# Patient Record
Sex: Female | Born: 1985
Health system: Southern US, Community
[De-identification: ages and names within clinical notes are randomized; demographics above are authoritative.]

## PROBLEM LIST (undated history)

## (undated) DIAGNOSIS — R519 Headache, unspecified: Secondary | ICD-10-CM

## (undated) DIAGNOSIS — F32A Depression, unspecified: Secondary | ICD-10-CM

## (undated) DIAGNOSIS — O1400 Mild to moderate pre-eclampsia, unspecified trimester: Secondary | ICD-10-CM

## (undated) DIAGNOSIS — F329 Major depressive disorder, single episode, unspecified: Secondary | ICD-10-CM

## (undated) DIAGNOSIS — R51 Headache: Secondary | ICD-10-CM

## (undated) HISTORY — DX: Mild to moderate pre-eclampsia, unspecified trimester: O14.00

## (undated) HISTORY — DX: Headache, unspecified: R51.9

## (undated) HISTORY — PX: ULNAR NERVE REPAIR: SHX2594

## (undated) HISTORY — DX: Depression, unspecified: F32.A

## (undated) HISTORY — DX: Headache: R51

## (undated) HISTORY — PX: WISDOM TOOTH EXTRACTION: SHX21

## (undated) HISTORY — DX: Major depressive disorder, single episode, unspecified: F32.9

## (undated) HISTORY — PX: OTHER SURGICAL HISTORY: SHX169

## (undated) HISTORY — PX: FOOT SURGERY: SHX648

---
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
## 2005-04-13 ENCOUNTER — Emergency Department (HOSPITAL_COMMUNITY): Admission: EM | Admit: 2005-04-13 | Discharge: 2005-04-13 | Payer: Self-pay | Admitting: Emergency Medicine

## 2012-10-07 ENCOUNTER — Ambulatory Visit (INDEPENDENT_AMBULATORY_CARE_PROVIDER_SITE_OTHER): Payer: BC Managed Care – PPO | Admitting: Nurse Practitioner

## 2012-10-07 ENCOUNTER — Encounter: Payer: Self-pay | Admitting: Nurse Practitioner

## 2012-10-07 VITALS — BP 124/83 | HR 92 | Temp 98.1°F | Ht 62.75 in | Wt 152.0 lb

## 2012-10-07 DIAGNOSIS — G4700 Insomnia, unspecified: Secondary | ICD-10-CM

## 2012-10-07 DIAGNOSIS — F411 Generalized anxiety disorder: Secondary | ICD-10-CM | POA: Insufficient documentation

## 2012-10-07 MED ORDER — ALPRAZOLAM 0.25 MG PO TABS: 0.2500 mg | ORAL_TABLET | Freq: Every evening | ORAL | Status: DC | PRN

## 2012-10-07 NOTE — Progress Notes (Signed)
  Subjective:    Patient ID: Erica Rodriguez, female    DOB: Aug 02, 1985, 27 y.o.   MRN: [id number]  HPIPatient has Dx of GAD. Has been on Lexapro for 2 months and dose was increased to 20mg  at last check. Family and co-workers say she is not acting right since increased dose. They say she isn't happy and bubbly anymore. This all started because she was stressing out about not sleeping. Takes ambien 10mg  but doesn't take every night cause if sh does then it doesn't work. Has tried melatonin OTC which doesn't help with sleep.    Review of Systems  Constitutional: Negative.   HENT: Negative.   Eyes: Negative.   Respiratory: Negative.   Cardiovascular: Negative.   Gastrointestinal: Negative.   Endocrine: Negative.   Genitourinary: Negative.   Musculoskeletal: Negative.   Psychiatric/Behavioral: Negative.        Objective:   Physical Exam  Constitutional: She is oriented to person, place, and time. She appears well-developed and well-nourished.  Cardiovascular: Normal rate, normal heart sounds and intact distal pulses.   No murmur heard. Pulmonary/Chest: Effort normal and breath sounds normal.  Neurological: She is alert and oriented to person, place, and time.  Skin: Skin is warm and dry.  Psychiatric: She has a normal mood and affect. Her behavior is normal. Judgment and thought content normal.   BP 124/83  Pulse 92  Temp(Src) 98.1 F (36.7 C) (Oral)  Ht 5' 2.75" (1.594 m)  Wt 152 lb (68.947 kg)  BMI 27.14 kg/m2        Assessment & Plan:  Insomnia  alternate Xanax and ambien prn sleep- DO NOT TAKE BOTH IN SAME NIGHT  Bedtime ritual  Avoid caffiene 2 hrs prior to bedtime  Wean off lexapro- pt already down to 10mg  qd- every other day for 1 week then stop.  [name], FNP

## 2012-10-07 NOTE — Patient Instructions (Signed)

## 2012-11-19 ENCOUNTER — Other Ambulatory Visit: Payer: Self-pay | Admitting: *Deleted

## 2012-11-19 DIAGNOSIS — G4700 Insomnia, unspecified: Secondary | ICD-10-CM

## 2012-11-19 MED ORDER — ALPRAZOLAM 0.25 MG PO TABS: 0.2500 mg | ORAL_TABLET | Freq: Every evening | ORAL | Status: DC | PRN

## 2012-11-19 MED ORDER — ZOLPIDEM TARTRATE 10 MG PO TABS: 10.0000 mg | ORAL_TABLET | Freq: Every evening | ORAL | Status: DC | PRN

## 2012-11-19 NOTE — Telephone Encounter (Signed)
Please phone ambien and xanax rx

## 2012-11-19 NOTE — Telephone Encounter (Signed)
Patient requesting refill on ambien and xanax. Wanted to know if you would fill without her being seen. Both  meds last filled on 4-3. Please advise. If approved please have nurse phone in to [facility] and notify pt. Thank you

## 2012-11-19 NOTE — Addendum Note (Signed)
Addended by: Bennie Pierini on: 11/19/2012 12:28 PM   Modules accepted: Orders

## 2012-11-19 NOTE — Telephone Encounter (Signed)
Pt aware, both rx's called to [facility] vm.

## 2012-12-29 ENCOUNTER — Telehealth: Payer: Self-pay | Admitting: *Deleted

## 2012-12-29 DIAGNOSIS — G4700 Insomnia, unspecified: Secondary | ICD-10-CM

## 2012-12-29 MED ORDER — ZOLPIDEM TARTRATE 10 MG PO TABS: 10.0000 mg | ORAL_TABLET | Freq: Every evening | ORAL | Status: DC | PRN

## 2012-12-29 MED ORDER — ALPRAZOLAM 0.25 MG PO TABS: 0.2500 mg | ORAL_TABLET | Freq: Every evening | ORAL | Status: DC | PRN

## 2012-12-29 NOTE — Telephone Encounter (Signed)
PT AWARE BOTH MEDS WERE REFILLED AT [facility]

## 2012-12-29 NOTE — Telephone Encounter (Signed)
Please call in rx for xanax and ambien with 1 refill each

## 2012-12-29 NOTE — Telephone Encounter (Signed)
Wants refill on Ambien and Xanax. Really helping her sleep

## 2013-02-14 ENCOUNTER — Telehealth: Payer: Self-pay | Admitting: *Deleted

## 2013-02-14 MED ORDER — AZITHROMYCIN 250 MG PO TABS: ORAL_TABLET | ORAL | Status: DC

## 2013-02-14 NOTE — Telephone Encounter (Signed)
Son seen last week for URI now she has all the same symptoms and is unable to come in today. Can we please call her in an antibiotic. Her son Gerilyn Pilgrim saw you last week.

## 2013-02-14 NOTE — Telephone Encounter (Signed)
Left details on pt cell vm. 

## 2013-02-14 NOTE — Telephone Encounter (Signed)
zpak rx sent to pharmacy 

## 2013-02-25 ENCOUNTER — Encounter: Payer: Self-pay | Admitting: Nurse Practitioner

## 2013-02-25 ENCOUNTER — Ambulatory Visit (INDEPENDENT_AMBULATORY_CARE_PROVIDER_SITE_OTHER): Payer: BC Managed Care – PPO | Admitting: Nurse Practitioner

## 2013-02-25 VITALS — BP 116/78 | HR 78 | Temp 98.7°F | Ht 62.75 in | Wt 157.0 lb

## 2013-02-25 DIAGNOSIS — R6884 Jaw pain: Secondary | ICD-10-CM

## 2013-02-25 MED ORDER — METHYLPREDNISOLONE ACETATE 80 MG/ML IJ SUSP
80.0000 mg | Freq: Once | INTRAMUSCULAR | Status: AC
  Administered 2013-02-25: 80 mg via INTRAMUSCULAR

## 2013-02-25 MED ORDER — PREDNISONE 20 MG PO TABS: ORAL_TABLET | ORAL | Status: DC

## 2013-02-25 NOTE — Progress Notes (Signed)
  Subjective:    Patient ID: Erica Rodriguez, female    DOB: 1985/08/20, 27 y.o.   MRN: [id number]  HPI Patient had a feeling put in her right lower jaw last week and now she can't open her Jaw because it hurts so bad- Has taken Ibuprofen which hasn't helped. Hurts worse after work where se has to talk all day long.    Review of Systems  All other systems reviewed and are negative.       Objective:   Physical Exam  Constitutional: She appears well-developed and well-nourished.  Cardiovascular: Normal rate and normal heart sounds.   Pulmonary/Chest: Effort normal and breath sounds normal.  Musculoskeletal:  Limited ROM of jaw due to pain with opening on left- Mild edema- no point tenderness.   BP 116/78  Pulse 78  Temp(Src) 98.7 F (37.1 C) (Oral)  Ht 5' 2.75" (1.594 m)  Wt 157 lb (71.215 kg)  BMI 28.03 kg/m2        Assessment & Plan:  1. Jaw pain Ice if helps No chewing gum - predniSONE (DELTASONE) 20 MG tablet; 2 tablets at the same time daily for 5 days- start saturday  Dispense: 10 tablet; Refill: 0 - methylPREDNISolone acetate (DEPO-MEDROL) injection 80 mg; Inject 1 mL (80 mg total) into the muscle once.  [name], FNP

## 2013-02-25 NOTE — Patient Instructions (Signed)
Preventive Dental Care Preventative dental care is an important part of overall health for children and adults. Regular care of the teeth and gums can prevent tooth decay and gum disease. Substances are created in your mouth every day that need to be removed, including:  Plaque. This is a sticky substance around the teeth and gums.  Tartar. This is a hardened form of plaque. You can remove plaque and tartar by brushing and flossing. Brushing and flossing will also help prevent cavities, gingivitis, and tooth loss. Your dentist can remove the plaque and tartar not removed with regular daily care. A dentist can also find other dental problems. Problems that are detected early can be treated conservatively and with less expense. BABIES, TODDLERS, AND PRESCHOOL AGED CHILDREN  Clean your baby's gums after feedings with a wet washcloth (water only).  Do not give your baby a bottle with milk, formula, or juice for sipping before they go to sleep.  Brush your baby's emerging teeth with a small toothbrush and water 2 times per day.  Begin flossing your baby's teeth when they are in contact with one another.  Many dental problems are preventable with early assessment. Take your child to a dentist when the first tooth erupts or by age 1. Discuss risks for tooth decay, your child's growth and development, fluoride needs, oral habits, proper diet, and oral hygiene.  Bring your child to the dentist every 6 months.  Even when your child is over age 2, brush your child's teeth for him or her. Use a small pea-sized amount of fluoride toothpaste. Make sure your child does not swallow the toothpaste.  Contact your dentist if your child has pain or other problems in the mouth. SCHOOL AGED CHILDREN AND ADOLESCENTS  Continue to brush your child's teeth 2 times per day with a child toothbrush and pea-sized amount of toothpaste until your child is 6 to 7 years old.  Help your child floss until he or she can do  it properly.  Bring your child to the dentist every 6 months for professional cleanings and exams.  If your child is involved in contact sports, make sure he or she wears a properly fitted mouth guard.  Ask your dentist about dental sealants. This is a coating painted onto the molars to prevent decay.   Encourage a healthy diet. Help your child limit sugary drinks and foods.  Make sure your child has an early orthodontic evaluation. Your child should have an evaluation by about age 7. Many orthodontic problems can be treated early, preventing the need for full fixed braces in the future.  Talk to your adolescent about the risks of oral piercings and smoking. Help your child avoid these risks.  Contact your dental caregiver if your child has pain or other problems in the mouth. ADULTS  Brush at least 2 times per day and after meals if possible. Brush for at least 2 minutes.  Use a fluoride toothpaste or drink fluoridated water.  Floss daily.  Keep retainers, dentures, or other mouth devices clean and sanitized. Soak or brush them as directed.  Eat a healthy diet. Limit sugary drinks and foods.  See your dentist every 6 months.  Follow up with your dentist as directed.  Always see your dentist at the first sign of tooth or gum pain.  Avoid smoking.  Limit alcohol. ELDERLY OR THOSE WITH A CHRONIC HEALTH CONDITION Follow the adult guidelines above, and in addition:  Manage chronic conditions, such as diabetes. Certain conditions   can increase your risk of gum disease.  If you experience dry mouth from medicines, ask your caregiver about treatment options. Try drinking more water and chewing sugarless gum. Avoid alcohol.  Visit your dentist prior to cancer treatment to take care of any problems. SEEK IMMEDIATE DENTAL CARE IF:  You develop pain, bleeding, or soreness in the gum, tooth, jaw, or mouth area.  A permanent tooth becomes loose or separated from the gum  socket.  You experience a blow or injury to the mouth or jaw area. Document Released: 10/18/2010 Document Revised: 09/15/2011 Document Reviewed: 10/18/2010 Exit[employer] Patient Information 2014 [employer].  

## 2013-03-01 ENCOUNTER — Telehealth: Payer: Self-pay | Admitting: *Deleted

## 2013-03-01 NOTE — Telephone Encounter (Signed)
Patient notified

## 2013-03-01 NOTE — Telephone Encounter (Signed)
Should have helped by now- contact dentist

## 2013-03-01 NOTE — Telephone Encounter (Signed)
Wants to know if prednisone should be working by now. Still having difficulty opening mouth all the way and difficulty eating. Please advise

## 2013-03-03 NOTE — Telephone Encounter (Signed)
She contacted her dentist yesterday and he said to give it more time.  He told her that the needle may have went in too far.   She wants to know if you will prescribe an antibiotic to see if that will help.

## 2013-03-03 NOTE — Telephone Encounter (Signed)
That won't make a difference

## 2013-03-04 NOTE — Telephone Encounter (Signed)
Do you think she needs to come back in and be seen?

## 2013-03-04 NOTE — Telephone Encounter (Signed)
Wait and see if improves over the weekend

## 2013-03-04 NOTE — Telephone Encounter (Signed)
Patient notified. Will call back Monday if no better

## 2013-03-11 ENCOUNTER — Telehealth: Payer: Self-pay | Admitting: *Deleted

## 2013-03-11 DIAGNOSIS — G4700 Insomnia, unspecified: Secondary | ICD-10-CM

## 2013-03-11 MED ORDER — ALPRAZOLAM 0.25 MG PO TABS: 0.2500 mg | ORAL_TABLET | Freq: Every evening | ORAL | Status: DC | PRN

## 2013-03-11 MED ORDER — ZOLPIDEM TARTRATE 10 MG PO TABS: 10.0000 mg | ORAL_TABLET | Freq: Every evening | ORAL | Status: DC | PRN

## 2013-03-11 NOTE — Telephone Encounter (Signed)
Please call in rx for ambien and xanax with 1 refill each

## 2013-03-11 NOTE — Telephone Encounter (Signed)
meds called into [facility] at 330 pm- 9/5-jhb

## 2013-03-11 NOTE — Telephone Encounter (Signed)
Wants refill on ambien and xanax. Please advise

## 2013-05-09 ENCOUNTER — Other Ambulatory Visit: Payer: Self-pay | Admitting: *Deleted

## 2013-05-09 DIAGNOSIS — G4700 Insomnia, unspecified: Secondary | ICD-10-CM

## 2013-05-09 MED ORDER — ALPRAZOLAM 0.25 MG PO TABS: 0.2500 mg | ORAL_TABLET | Freq: Every evening | ORAL | Status: DC | PRN

## 2013-05-09 MED ORDER — ZOLPIDEM TARTRATE 10 MG PO TABS: 10.0000 mg | ORAL_TABLET | Freq: Every evening | ORAL | Status: DC | PRN

## 2013-05-09 NOTE — Telephone Encounter (Signed)
Called to [facility] 

## 2013-05-09 NOTE — Telephone Encounter (Signed)
Last seen in office on 02-25-13 for acute visit. Both rxs last filled on 04-08-13. Please advise. If approved please route to Pool B so nurse can phone in to [facility]

## 2013-05-09 NOTE — Telephone Encounter (Signed)
Please phone in ambien and xanax 

## 2013-05-12 ENCOUNTER — Ambulatory Visit (INDEPENDENT_AMBULATORY_CARE_PROVIDER_SITE_OTHER): Payer: BC Managed Care – PPO | Admitting: Nurse Practitioner

## 2013-05-12 ENCOUNTER — Telehealth: Payer: Self-pay | Admitting: Family Medicine

## 2013-05-12 ENCOUNTER — Encounter: Payer: Self-pay | Admitting: Nurse Practitioner

## 2013-05-12 VITALS — BP 123/85 | HR 87 | Temp 97.6°F | Ht 62.75 in | Wt 159.0 lb

## 2013-05-12 DIAGNOSIS — J019 Acute sinusitis, unspecified: Secondary | ICD-10-CM

## 2013-05-12 DIAGNOSIS — B9689 Other specified bacterial agents as the cause of diseases classified elsewhere: Secondary | ICD-10-CM

## 2013-05-12 MED ORDER — AZITHROMYCIN 250 MG PO TABS: ORAL_TABLET | ORAL | Status: DC

## 2013-05-12 NOTE — Progress Notes (Signed)
  Subjective:    Patient ID: Erica Rodriguez, female    DOB: 1986/06/12, 27 y.o.   MRN: [id number]  HPI Patient in c/o nasal congestion and headache- started about 2 days ago- mucinex d na dOTC inus meds no better.    Review of Systems  Constitutional: Positive for chills and fatigue. Negative for fever.  HENT: Positive for congestion, postnasal drip, rhinorrhea and sinus pressure. Negative for ear pain, mouth sores, sore throat and trouble swallowing.   Respiratory: Positive for cough.   Cardiovascular: Negative.   Gastrointestinal: Negative.   Genitourinary: Negative.   Musculoskeletal: Negative.        Objective:   Physical Exam  Constitutional: She is oriented to person, place, and time. She appears well-developed and well-nourished.  HENT:  Right Ear: Hearing, tympanic membrane, external ear and ear canal normal.  Left Ear: Hearing, tympanic membrane, external ear and ear canal normal.  Nose: Mucosal edema and rhinorrhea present. Right sinus exhibits maxillary sinus tenderness. Right sinus exhibits no frontal sinus tenderness. Left sinus exhibits maxillary sinus tenderness. Left sinus exhibits no frontal sinus tenderness.  Mouth/Throat: Uvula is midline, oropharynx is clear and moist and mucous membranes are normal.  Eyes: EOM are normal. Pupils are equal, round, and reactive to light.  Neck: Normal range of motion. Neck supple.  Cardiovascular: Normal rate, regular rhythm and normal heart sounds.   Pulmonary/Chest: Effort normal and breath sounds normal.  Lymphadenopathy:    She has no cervical adenopathy.  Neurological: She is alert and oriented to person, place, and time.  Skin: Skin is warm.  Psychiatric: She has a normal mood and affect. Her behavior is normal. Judgment and thought content normal.    BP 123/85  Pulse 87  Temp(Src) 97.6 F (36.4 C) (Oral)  Ht 5' 2.75" (1.594 m)  Wt 159 lb (72.122 kg)  BMI 28.39 kg/m2       Assessment & Plan:   1. Acute bacterial  sinusitis    Meds ordered this encounter  Medications  . azithromycin (ZITHROMAX) 250 MG tablet    Sig: As directed    Dispense:  6 each    Refill:  0    Order Specific Question:  Supervising Provider    Answer:  Ernestina Penna [[id number]]   1. Take meds as prescribed 2. Use a cool mist humidifier especially during the winter months and when heat has  been humid. 3. Use saline nose sprays frequently 4. Saline irrigations of the nose can be very helpful if done frequently.  * 4X daily for 1 week*  * Use of a nettie pot can be helpful with this. Follow directions with this* 5. Drink plenty of fluids 6. Keep thermostat turn down low 7.For any cough or congestion  Use plain Mucinex- regular strength or max strength is fine   * Children- consult with Pharmacist for dosing 8. For fever or aces or pains- take tylenol or ibuprofen appropriate for age and weight.  * for fevers greater than 101 orally you may alternate ibuprofen and tylenol every  3 hours.   [name], FNP

## 2013-05-12 NOTE — Telephone Encounter (Signed)
NTBS.

## 2013-05-12 NOTE — Telephone Encounter (Signed)
appt given  

## 2013-05-12 NOTE — Patient Instructions (Signed)

## 2013-05-12 NOTE — Telephone Encounter (Signed)
Wants something called in for sinus infection

## 2013-05-18 ENCOUNTER — Ambulatory Visit: Payer: BC Managed Care – PPO | Admitting: Nurse Practitioner

## 2013-06-17 ENCOUNTER — Ambulatory Visit (INDEPENDENT_AMBULATORY_CARE_PROVIDER_SITE_OTHER): Payer: BC Managed Care – PPO | Admitting: Physician Assistant

## 2013-06-17 ENCOUNTER — Encounter: Payer: Self-pay | Admitting: Physician Assistant

## 2013-06-17 VITALS — BP 135/82 | HR 97 | Temp 97.7°F | Ht 61.5 in | Wt 157.6 lb

## 2013-06-17 DIAGNOSIS — B373 Candidiasis of vulva and vagina: Secondary | ICD-10-CM

## 2013-06-17 DIAGNOSIS — N898 Other specified noninflammatory disorders of vagina: Secondary | ICD-10-CM

## 2013-06-17 DIAGNOSIS — B9789 Other viral agents as the cause of diseases classified elsewhere: Secondary | ICD-10-CM

## 2013-06-17 DIAGNOSIS — J029 Acute pharyngitis, unspecified: Secondary | ICD-10-CM

## 2013-06-17 DIAGNOSIS — N899 Noninflammatory disorder of vagina, unspecified: Secondary | ICD-10-CM

## 2013-06-17 DIAGNOSIS — J020 Streptococcal pharyngitis: Secondary | ICD-10-CM

## 2013-06-17 LAB — POCT URINALYSIS DIPSTICK
Bilirubin, UA: NEGATIVE
Glucose, UA: NEGATIVE
Ketones, UA: NEGATIVE
Leukocytes, UA: NEGATIVE
Nitrite, UA: NEGATIVE
Spec Grav, UA: <=1.005
Urobilinogen, UA: NEGATIVE
pH, UA: 7.5

## 2013-06-17 LAB — POCT UA - MICROSCOPIC ONLY
Bacteria, U Microscopic: NEGATIVE
Casts, Ur, LPF, POC: NEGATIVE
Crystals, Ur, HPF, POC: NEGATIVE
Mucus, UA: NEGATIVE
WBC, Ur, HPF, POC: NEGATIVE

## 2013-06-17 LAB — POCT RAPID STREP A (OFFICE): Rapid Strep A Screen: NEGATIVE

## 2013-06-17 MED ORDER — FLUCONAZOLE 150 MG PO TABS: ORAL_TABLET | ORAL | Status: DC

## 2013-06-17 MED ORDER — FLUTICASONE PROPIONATE 50 MCG/ACT NA SUSP: 2.0000 | Freq: Every day | NASAL | Status: DC

## 2013-06-17 NOTE — Progress Notes (Signed)
   Subjective:    Patient ID: Erica Rodriguez, female    DOB: [birth year], 27 y.o.   MRN: [id number]  HPI 27 y/o female presents w/ c/o intermittantly productive cough x 5 days. Associated sore throat, PND. Denies fever but has had sweats, chills. Son tested  Positive for strep last week. Has tried OTC Musinex and Musinex D with no relief. She also had some non-associated mild discomfort with urination and vaginal tenderness.    Review of Systems  Constitutional: Positive for chills, diaphoresis and fatigue. Negative for fever, activity change, appetite change and unexpected weight change.  HENT: Positive for congestion, postnasal drip, sore throat and trouble swallowing (due to throat pain). Negative for drooling, ear discharge, ear pain, facial swelling, hearing loss, mouth sores, nosebleeds, rhinorrhea, sinus pressure, sneezing and voice change.   Eyes: Negative.   Gastrointestinal: Negative.   Genitourinary: Positive for dysuria (discomfort with urination. no burning). Negative for urgency, frequency, hematuria, flank pain, decreased urine volume, vaginal bleeding, vaginal discharge, enuresis, difficulty urinating, genital sores, vaginal pain, menstrual problem, pelvic pain and dyspareunia.  Musculoskeletal: Negative.   Neurological: Positive for light-headedness.       Objective:   Physical Exam  Constitutional: She is oriented to person, place, and time. She appears well-developed and well-nourished. No distress.  HENT:  Head: Normocephalic and atraumatic.  Right Ear: Tympanic membrane and external ear normal.  Left Ear: Tympanic membrane and external ear normal.  Mouth/Throat: Oropharyngeal exudate present.  Posterior pharynx erythematous w/ tonsillar hypertrophy bilaterally  TTP of submandibular nodes bilaterally   Strep negative    Neck: Normal range of motion. Neck supple. No thyromegaly present.  Cardiovascular: Normal rate, regular rhythm and normal heart sounds.  Exam  reveals no gallop and no friction rub.   No murmur heard. Pulmonary/Chest: Effort normal and breath sounds normal. No respiratory distress. She has no wheezes. She has no rales. She exhibits no tenderness.  Genitourinary:  U/A positive for moderate yeast  Neurological: She is alert and oriented to person, place, and time. She has normal reflexes.  Skin: Skin is warm. She is not diaphoretic.          Assessment & Plan:  1. Vaginal candidiasis: Rx for Diflucan 150mg  on day 1, repeat in 3 days.  2. Nasal congestion: PE is negative for bacterial cause. Strep negative. Advised patient to use OTC musinex ( plain) and sudafed if needed for head congestion. Prescribed Flonase to use q day.   RTC if s/s worsen or do not improve

## 2013-06-22 ENCOUNTER — Telehealth: Payer: Self-pay | Admitting: Physician Assistant

## 2013-06-27 MED ORDER — FLUCONAZOLE 150 MG PO TABS: ORAL_TABLET | ORAL | Status: DC

## 2013-06-27 NOTE — Telephone Encounter (Signed)
Diflucan sent to phamacy

## 2013-07-12 ENCOUNTER — Telehealth: Payer: Self-pay | Admitting: *Deleted

## 2013-07-12 DIAGNOSIS — G4700 Insomnia, unspecified: Secondary | ICD-10-CM

## 2013-07-12 MED ORDER — ZOLPIDEM TARTRATE 10 MG PO TABS: 10.0000 mg | ORAL_TABLET | Freq: Every evening | ORAL | Status: DC | PRN

## 2013-07-12 MED ORDER — ALPRAZOLAM 0.25 MG PO TABS: 0.2500 mg | ORAL_TABLET | Freq: Every evening | ORAL | Status: DC | PRN

## 2013-07-12 NOTE — Telephone Encounter (Signed)
[place]  1 po Qhs #30 1 refill And xanax 0.25 1 po qd prn #30 1 refill

## 2013-07-12 NOTE — Telephone Encounter (Signed)
Patient needs a refill of her xanax and ambien. Please advise

## 2013-07-13 NOTE — Telephone Encounter (Signed)
Called in.

## 2013-09-12 ENCOUNTER — Other Ambulatory Visit: Payer: Self-pay | Admitting: *Deleted

## 2013-09-12 DIAGNOSIS — G4700 Insomnia, unspecified: Secondary | ICD-10-CM

## 2013-09-12 MED ORDER — ZOLPIDEM TARTRATE 10 MG PO TABS: 10.0000 mg | ORAL_TABLET | Freq: Every evening | ORAL | Status: DC | PRN

## 2013-09-12 MED ORDER — ALPRAZOLAM 0.25 MG PO TABS: 0.2500 mg | ORAL_TABLET | Freq: Every evening | ORAL | Status: DC | PRN

## 2013-09-12 NOTE — Telephone Encounter (Signed)
Patient last filled both on 08-12-13. Last office visit 06-17-13. Please advise. If approved please route to Pool B so nurse can phone in to pharmacy

## 2013-09-12 NOTE — Telephone Encounter (Signed)
Called to [facility]. Patient notified 

## 2013-09-12 NOTE — Telephone Encounter (Addendum)
Please call in xanax and ambien with 1 refills 

## 2013-11-15 ENCOUNTER — Telehealth: Payer: Self-pay | Admitting: *Deleted

## 2013-11-15 DIAGNOSIS — G4700 Insomnia, unspecified: Secondary | ICD-10-CM

## 2013-11-15 MED ORDER — ALPRAZOLAM 0.25 MG PO TABS: 0.2500 mg | ORAL_TABLET | Freq: Every evening | ORAL | Status: DC | PRN

## 2013-11-15 MED ORDER — ZOLPIDEM TARTRATE 10 MG PO TABS: 10.0000 mg | ORAL_TABLET | Freq: Every evening | ORAL | Status: DC | PRN

## 2013-11-15 NOTE — Telephone Encounter (Signed)
Please call in ambien 10 mg 1 po Qhs #30 and xanax 0.25 1 po QD prn with 1 refills each

## 2013-11-15 NOTE — Telephone Encounter (Signed)
Requesting a refill on xanax and ambien. Please advise

## 2013-11-15 NOTE — Telephone Encounter (Signed)
Called in.

## 2014-01-13 ENCOUNTER — Telehealth: Payer: Self-pay | Admitting: *Deleted

## 2014-01-13 DIAGNOSIS — G4700 Insomnia, unspecified: Secondary | ICD-10-CM

## 2014-01-13 MED ORDER — ALPRAZOLAM 0.25 MG PO TABS: 0.2500 mg | ORAL_TABLET | Freq: Every evening | ORAL | Status: DC | PRN

## 2014-01-13 MED ORDER — ZOLPIDEM TARTRATE 10 MG PO TABS: 10.0000 mg | ORAL_TABLET | Freq: Every evening | ORAL | Status: DC | PRN

## 2014-01-13 NOTE — Telephone Encounter (Signed)
Called in.

## 2014-01-13 NOTE — Telephone Encounter (Signed)
Please call in xanax 0.25 1 po BID #60 with 1 refills Please call in ambien 10mg  1 po qhs #30 with 1 refills

## 2014-01-13 NOTE — Telephone Encounter (Signed)
Wants ambien and xanax refilled

## 2014-02-10 ENCOUNTER — Ambulatory Visit (INDEPENDENT_AMBULATORY_CARE_PROVIDER_SITE_OTHER): Payer: BC Managed Care – PPO | Admitting: Physician Assistant

## 2014-02-10 ENCOUNTER — Encounter: Payer: Self-pay | Admitting: Physician Assistant

## 2014-02-10 VITALS — BP 124/80 | HR 90 | Temp 98.7°F | Ht 61.5 in | Wt 157.8 lb

## 2014-02-10 DIAGNOSIS — L708 Other acne: Secondary | ICD-10-CM

## 2014-02-10 DIAGNOSIS — L700 Acne vulgaris: Secondary | ICD-10-CM

## 2014-02-10 MED ORDER — MINOCYCLINE HCL 100 MG PO CAPS: 100.0000 mg | ORAL_CAPSULE | Freq: Every day | ORAL | Status: DC

## 2014-02-10 MED ORDER — TRETINOIN 0.025 % EX CREA: TOPICAL_CREAM | CUTANEOUS | Status: DC

## 2014-02-10 NOTE — Patient Instructions (Signed)
Download the APP Myfitnesspal  Acne Acne is a skin problem that causes small, red bumps (pimples). Acne happens when the tiny holes in your skin (pores) get blocked. Acne is most common on the face, neck, chest, and upper back. Your doctor can help you choose a treatment plan. It may take 2 months of treatment before your skin gets better. HOME CARE Good skin care is the most important part of treatment.  Wash your skin gently at least twice a day. Wash your skin after exercise. Always wash your skin before bed.  Use mild soap.  After you wash your face, put on a water-based face lotion.  Keep your hair off of your face. Wash your hair every day.  Only take medicines as told by your doctor.  Use a sunscreen or sunblock with SPF 30 or higher.  Choose makeup that does not block the holes in your skin (noncomedogenic).  Avoid leaning your chin or forehead on your hands.  Avoid wearing tight headbands or hats.  Avoid picking or squeezing your red bumps. This can make the problem worse and can leave scars. GET HELP RIGHT AWAY IF:   Your red bumps are not better after 8 weeks.  Your red bumps gets worse.  You have a large area of skin that is red or tender. MAKE SURE YOU:   Understand these instructions.  Will watch your condition.  Will get help right away if you are not doing well or get worse. Document Released: 06/12/2011 Document Revised: 09/15/2011 Document Reviewed: 06/12/2011 [facility] Patient Information 2015 [place], [place]. This information is not intended to replace advice given to you by your health care provider. Make sure you discuss any questions you have with your health care provider.   Exercise to Lose Weight Exercise and a healthy diet may help you lose weight. Your doctor may suggest specific exercises. EXERCISE IDEAS AND TIPS  Choose low-cost things you enjoy doing, such as walking, bicycling, or exercising to workout videos.  Take stairs instead of  the elevator.  Walk during your lunch break.  Park your car further away from work or school.  Go to a gym or an exercise class.  Start with 5 to 10 minutes of exercise each day. Build up to 30 minutes of exercise 4 to 6 days a week.  Wear shoes with good support and comfortable clothes.  Stretch before and after working out.  Work out until you breathe harder and your heart beats faster.  Drink extra water when you exercise.  Do not do so much that you hurt yourself, feel dizzy, or get very short of breath. Exercises that burn about 150 calories:  Running 1  miles in 15 minutes.  Playing volleyball for 45 to 60 minutes.  Washing and waxing a car for 45 to 60 minutes.  Playing touch football for 45 minutes.  Walking 1  miles in 35 minutes.  Pushing a stroller 1  miles in 30 minutes.  Playing basketball for 30 minutes.  Raking leaves for 30 minutes.  Bicycling 5 miles in 30 minutes.  Walking 2 miles in 30 minutes.  Dancing for 30 minutes.  Shoveling snow for 15 minutes.  Swimming laps for 20 minutes.  Walking up stairs for 15 minutes.  Bicycling 4 miles in 15 minutes.  Gardening for 30 to 45 minutes.  Jumping rope for 15 minutes.  Washing windows or floors for 45 to 60 minutes. Document Released: 07/26/2010 Document Revised: 09/15/2011 Document Reviewed: 07/26/2010 [employer] Patient  Information 2015 [place], [place]. This information is not intended to replace advice given to you by your health care provider. Make sure you discuss any questions you have with your health care provider. Calorie Counting for Weight Loss Calories are energy you get from the things you eat and drink. Your body uses this energy to keep you going throughout the day. The number of calories you eat affects your weight. When you eat more calories than your body needs, your body stores the extra calories as fat. When you eat fewer calories than your body needs, your body burns fat to  get the energy it needs. Calorie counting means keeping track of how many calories you eat and drink each day. If you make sure to eat fewer calories than your body needs, you should lose weight. In order for calorie counting to work, you will need to eat the number of calories that are right for you in a day to lose a healthy amount of weight per week. A healthy amount of weight to lose per week is usually 1-2 lb (0.5-0.9 kg). A dietitian can determine how many calories you need in a day and give you suggestions on how to reach your calorie goal.  WHAT IS MY MY PLAN? My goal is to have __________ calories per day.  If I have this many calories per day, I should lose around __________ pounds per week. WHAT DO I NEED TO KNOW ABOUT CALORIE COUNTING? In order to meet your daily calorie goal, you will need to:  Find out how many calories are in each food you would like to eat. Try to do this before you eat.  Decide how much of the food you can eat.  Write down what you ate and how many calories it had. Doing this is called keeping a food log. WHERE DO I FIND CALORIE INFORMATION? The number of calories in a food can be found on a Nutrition Facts label. Note that all the information on a label is based on a specific serving of the food. If a food does not have a Nutrition Facts label, try to look up the calories online or ask your dietitian for help. HOW DO I DECIDE HOW MUCH TO EAT? To decide how much of the food you can eat, you will need to consider both the number of calories in one serving and the size of one serving. This information can be found on the Nutrition Facts label. If a food does not have a Nutrition Facts label, look up the information online or ask your dietitian for help. Remember that calories are listed per serving. If you choose to have more than one serving of a food, you will have to multiply the calories per serving by the amount of servings you plan to eat. For example, the label  on a package of bread might say that a serving size is 1 slice and that there are 90 calories in a serving. If you eat 1 slice, you will have eaten 90 calories. If you eat 2 slices, you will have eaten 180 calories. HOW DO I KEEP A FOOD LOG? After each meal, record the following information in your food log:  What you ate.  How much of it you ate.  How many calories it had.  Then, add up your calories. Keep your food log near you, such as in a small notebook in your pocket. Another option is to use a mobile app or website. Some programs will  calculate calories for you and show you how many calories you have left each time you add an item to the log. WHAT ARE SOME CALORIE COUNTING TIPS?  Use your calories on foods and drinks that will fill you up and not leave you hungry. Some examples of this include foods like nuts and nut butters, vegetables, lean proteins, and high-fiber foods (more than 5 g fiber per serving).  Eat nutritious foods and avoid empty calories. Empty calories are calories you get from foods or beverages that do not have many nutrients, such as candy and soda. It is better to have a nutritious high-calorie food (such as an avocado) than a food with few nutrients (such as a bag of chips).  Know how many calories are in the foods you eat most often. This way, you do not have to look up how many calories they have each time you eat them.  Look out for foods that may seem like low-calorie foods but are really high-calorie foods, such as baked goods, soda, and fat-free candy.  Pay attention to calories in drinks. Drinks such as sodas, specialty coffee drinks, alcohol, and juices have a lot of calories yet do not fill you up. Choose low-calorie drinks like water and diet drinks.  Focus your calorie counting efforts on higher calorie items. Logging the calories in a garden salad that contains only vegetables is less important than calculating the calories in a milk shake.  Find a  way of tracking calories that works for you. Get creative. Most people who are successful find ways to keep track of how much they eat in a day, even if they do not count every calorie. WHAT ARE SOME PORTION CONTROL TIPS?  Know how many calories are in a serving. This will help you know how many servings of a certain food you can have.  Use a measuring cup to measure serving sizes. This is helpful when you start out. With time, you will be able to estimate serving sizes for some foods.  Take some time to put servings of different foods on your favorite plates, bowls, and cups so you know what a serving looks like.  Try not to eat straight from a bag or box. Doing this can lead to overeating. Put the amount you would like to eat in a cup or on a plate to make sure you are eating the right portion.  Use smaller plates, glasses, and bowls to prevent overeating. This is a quick and easy way to practice portion control. If your plate is smaller, less food can fit on it.  Try not to multitask while eating, such as watching TV or using your computer. If it is time to eat, sit down at a table and enjoy your food. Doing this will help you to start recognizing when you are full. It will also make you more aware of what and how much you are eating. HOW CAN I CALORIE COUNT WHEN EATING OUT?  Ask for smaller portion sizes or child-sized portions.  Consider sharing an entree and sides instead of getting your own entree.  If you get your own entree, eat only half. Ask for a box at the beginning of your meal and put the rest of your entree in it so you are not tempted to eat it.  Look for the calories on the menu. If calories are listed, choose the lower calorie options.  Choose dishes that include vegetables, fruits, whole grains, low-fat dairy products, and lean  protein. Focusing on smart food choices from each of the 5 food groups can help you stay on track at restaurants.  Choose items that are boiled,  broiled, grilled, or steamed.  Choose water, milk, unsweetened iced tea, or other drinks without added sugars. If you want an alcoholic beverage, choose a lower calorie option. For example, a regular margarita can have up to 700 calories and a glass of wine has around 150.  Stay away from items that are buttered, battered, fried, or served with cream sauce. Items labeled "crispy" are usually fried, unless stated otherwise.  Ask for dressings, sauces, and syrups on the side. These are usually very high in calories, so do not eat much of them.  Watch out for salads. Many people think salads are a healthy option, but this is often not the case. Many salads come with bacon, fried chicken, lots of cheese, fried chips, and dressing. All of these items have a lot of calories. If you want a salad, choose a garden salad and ask for grilled meats or steak. Ask for the dressing on the side, or ask for olive oil and vinegar or lemon to use as dressing.  Estimate how many servings of a food you are given. For example, a serving of cooked rice is  cup or about the size of half a tennis ball or one cupcake wrapper. Knowing serving sizes will help you be aware of how much food you are eating at restaurants. The list below tells you how big or small some common portion sizes are based on everyday objects.  1 oz--4 stacked dice.  3 oz--1 deck of cards.  1 tsp--1 dice.  1 Tbsp-- a Ping-Pong ball.  2 Tbsp--1 Ping-Pong ball.   cup--1 tennis ball or 1 cupcake wrapper.  1 cup--1 baseball. Document Released: 06/23/2005 Document Revised: 11/07/2013 Document Reviewed: 04/28/2013 [facility] Patient Information 2015 [place], [place]. This information is not intended to replace advice given to you by your health care provider. Make sure you discuss any questions you have with your health care provider.

## 2014-02-10 NOTE — Progress Notes (Signed)
   Subjective:    Patient ID: Erica Rodriguez, female    DOB: 09-16-1985, 28 y.o.   MRN: [id number]  HPI 28 y/o female presents with c/o weight gain over the past few months. She states that she tries to eat healthy and participates in exercise occasionally. She also states that since placement of her IUD, she has had increased acne breakouts on her face.     Review of Systems  Constitutional: Negative.   HENT: Negative.   Eyes: Negative.   Respiratory: Negative.   Cardiovascular: Negative.   Gastrointestinal: Negative.   Endocrine: Negative.   Genitourinary: Negative.   Musculoskeletal: Negative.   Skin: Negative.        Breakouts on face  Allergic/Immunologic: Negative.   Neurological: Negative.   Hematological: Negative.   Psychiatric/Behavioral: Negative.        Objective:   Physical Exam  Vitals reviewed. Constitutional: She is oriented to person, place, and time. She appears well-developed and well-nourished. No distress.  Cardiovascular: Normal rate, regular rhythm and normal heart sounds.   Pulmonary/Chest: Effort normal and breath sounds normal.  Neurological: She is alert and oriented to person, place, and time.  Skin: She is not diaphoretic.  Open and closed comedomes on face. Primarily chin          Assessment & Plan:  1. Weight gain. Handouts given on weight loss and exercise. Advised patient to exercise for at least 30 min, 3-5 days a week. Monitor foods with calorie counting and "My Fitness Pal" app. Discussed ways to cut back on calories and fat. Encouraged aerobic exercises such as running, jogging or walking at a moderate pace. If not change or patient begins to have suggestive symptoms, I suggest thyroid check at next visit.   2. Acne: Continue to use OTC Panoxyl wash. Rx Minocycline 100mg  q day with food along with topical Tretinoin .025% at bedtime. Handout given on acne. F/U in 3 months for reassessment.

## 2014-03-16 ENCOUNTER — Other Ambulatory Visit: Payer: Self-pay | Admitting: *Deleted

## 2014-03-16 DIAGNOSIS — G4700 Insomnia, unspecified: Secondary | ICD-10-CM

## 2014-03-16 MED ORDER — ALPRAZOLAM 0.25 MG PO TABS: 0.2500 mg | ORAL_TABLET | Freq: Every evening | ORAL | Status: DC | PRN

## 2014-03-16 MED ORDER — ZOLPIDEM TARTRATE 10 MG PO TABS: 10.0000 mg | ORAL_TABLET | Freq: Every evening | ORAL | Status: DC | PRN

## 2014-03-16 NOTE — Telephone Encounter (Signed)
Patient last seen in office on 02-10-14. Please advise on refills. If approved please route to pool B so nurse can phone in to pharmacy

## 2014-03-16 NOTE — Telephone Encounter (Signed)
Please call in xanax and ambien with 1 refills each 

## 2014-03-16 NOTE — Telephone Encounter (Signed)
Scripts called in

## 2014-03-28 ENCOUNTER — Telehealth: Payer: Self-pay | Admitting: *Deleted

## 2014-03-28 NOTE — Telephone Encounter (Signed)
Could the minocycline and retin A that she was started on for acne cause her to itch all over. Her eyes have also been very dry and itchy. Please advise

## 2014-03-28 NOTE — Telephone Encounter (Signed)
The minocycline could cause itching but usually causes rash as well if it is allergic reaction- the problem with eyes could be just seasonal allergies

## 2014-03-29 NOTE — Telephone Encounter (Signed)
Patient has eczema. Her main areas of concern are her eyelids, the corners or her mouth, and the inside of her thighs.  She isn't certain if this current problem is eczema or a reaction to the medication. She is taking cool showers but still notices an increase in itching after the shower.  She is taking Benadryl and Claritin as well. Appt scheduled for tomorrow afternoon. Patient aware.

## 2014-03-30 ENCOUNTER — Encounter: Payer: Self-pay | Admitting: Family

## 2014-03-30 ENCOUNTER — Ambulatory Visit (INDEPENDENT_AMBULATORY_CARE_PROVIDER_SITE_OTHER): Payer: BC Managed Care – PPO | Admitting: Family

## 2014-03-30 VITALS — BP 125/79 | HR 90 | Temp 97.9°F | Ht 61.5 in | Wt 158.0 lb

## 2014-03-30 DIAGNOSIS — L309 Dermatitis, unspecified: Secondary | ICD-10-CM

## 2014-03-30 DIAGNOSIS — L259 Unspecified contact dermatitis, unspecified cause: Secondary | ICD-10-CM

## 2014-03-30 MED ORDER — TRIAMCINOLONE ACETONIDE 0.025 % EX OINT: 1.0000 "application " | TOPICAL_OINTMENT | Freq: Two times a day (BID) | CUTANEOUS | Status: DC

## 2014-03-30 MED ORDER — DESOXIMETASONE 0.25 % EX CREA: 1.0000 "application " | TOPICAL_CREAM | Freq: Two times a day (BID) | CUTANEOUS | Status: DC

## 2014-03-30 NOTE — Patient Instructions (Signed)

## 2014-03-30 NOTE — Progress Notes (Signed)
   Subjective:    Patient ID: Erica Rodriguez, female    DOB: Jul 01, 1986, 28 y.o.   MRN: [id number]  Rash This is a new problem. The current episode started in the past 7 days. The problem has been waxing and waning since onset. The affected locations include the right upper leg and left upper leg (Inner thighs). The rash is characterized by itchiness and dryness. She was exposed to nothing. Pertinent negatives include no congestion, cough, eye pain, fatigue, fever, joint pain, shortness of breath or sore throat. Past treatments include antihistamine. The treatment provided mild relief. There is no history of allergies or asthma.      Review of Systems  Constitutional: Negative.  Negative for fever and fatigue.  HENT: Negative.  Negative for congestion and sore throat.   Eyes: Negative.  Negative for pain.  Respiratory: Negative.  Negative for cough and shortness of breath.   Cardiovascular: Negative.  Negative for palpitations.  Gastrointestinal: Negative.   Endocrine: Negative.   Genitourinary: Negative.   Musculoskeletal: Negative.  Negative for joint pain.  Skin: Positive for rash.  Neurological: Negative.  Negative for headaches.  Hematological: Negative.   Psychiatric/Behavioral: Negative.   All other systems reviewed and are negative.      Objective:   Physical Exam  Vitals reviewed. Constitutional: She is oriented to person, place, and time. She appears well-developed and well-nourished. No distress.  Eyes: Pupils are equal, round, and reactive to light.  Neck: Normal range of motion. Neck supple. No thyromegaly present.  Cardiovascular: Normal rate, regular rhythm, normal heart sounds and intact distal pulses.   No murmur heard. Pulmonary/Chest: Effort normal and breath sounds normal. No respiratory distress. She has no wheezes.  Abdominal: Soft. Bowel sounds are normal. She exhibits no distension. There is no tenderness.  Musculoskeletal: Normal range of motion. She  exhibits no edema and no tenderness.  Neurological: She is alert and oriented to person, place, and time. She has normal reflexes. No cranial nerve deficit.  Skin: Skin is warm and dry. Rash noted. There is erythema.  Generalized patchy rash on inner thighs  Psychiatric: She has a normal mood and affect. Her behavior is normal. Judgment and thought content normal.    BP 125/79  Pulse 90  Temp(Src) 97.9 F (36.6 C) (Oral)  Ht 5' 1.5" (1.562 m)  Wt 158 lb (71.668 kg)  BMI 29.37 kg/m2       Assessment & Plan:  1. Contact dermatitis -Do not scratch or pick -Keep moisturized    - triamcinolone (KENALOG) 0.025 % ointment; Apply 1 application topically 2 (two) times daily.  Dispense: 30 g; Refill: 0   2. Eczema - desoximetasone (TOPICORT) 0.25 % cream; Apply 1 application topically 2 (two) times daily.  Dispense: 30 g; Refill: 0  Jannifer Rodney, FNP

## 2014-05-04 ENCOUNTER — Other Ambulatory Visit: Payer: Self-pay | Admitting: *Deleted

## 2014-05-04 DIAGNOSIS — L700 Acne vulgaris: Secondary | ICD-10-CM

## 2014-05-04 MED ORDER — MINOCYCLINE HCL 100 MG PO CAPS: 100.0000 mg | ORAL_CAPSULE | Freq: Every day | ORAL | Status: DC

## 2014-05-16 ENCOUNTER — Other Ambulatory Visit: Payer: Self-pay | Admitting: *Deleted

## 2014-05-16 MED ORDER — ZOLPIDEM TARTRATE 10 MG PO TABS: 10.0000 mg | ORAL_TABLET | Freq: Every evening | ORAL | Status: DC | PRN

## 2014-05-16 NOTE — Telephone Encounter (Signed)
Please call in ambien with 1 refills 

## 2014-05-16 NOTE — Telephone Encounter (Signed)
Called in.

## 2014-05-16 NOTE — Telephone Encounter (Signed)
Patient last seen in office on 03-30-14. Please advise

## 2014-05-31 ENCOUNTER — Other Ambulatory Visit: Payer: Self-pay | Admitting: *Deleted

## 2014-05-31 MED ORDER — TRETINOIN 0.025 % EX CREA: TOPICAL_CREAM | CUTANEOUS | Status: DC

## 2014-07-14 ENCOUNTER — Other Ambulatory Visit: Payer: Self-pay | Admitting: Nurse Practitioner

## 2014-07-14 ENCOUNTER — Other Ambulatory Visit: Payer: Self-pay | Admitting: *Deleted

## 2014-07-14 NOTE — Telephone Encounter (Signed)
Last filled on 12/10

## 2014-07-14 NOTE — Telephone Encounter (Signed)
Patient last seen 03-30-14. Rx last filled on 05-16-14 with 1 RF. Please advise.

## 2014-07-15 MED ORDER — ZOLPIDEM TARTRATE 10 MG PO TABS: 10.0000 mg | ORAL_TABLET | Freq: Every evening | ORAL | Status: DC | PRN

## 2014-07-15 NOTE — Telephone Encounter (Signed)
Phoned in.

## 2014-07-15 NOTE — Telephone Encounter (Signed)
Please call in ambien with 1 refills 

## 2014-07-27 LAB — HM DIABETES EYE EXAM

## 2014-07-31 ENCOUNTER — Other Ambulatory Visit: Payer: Self-pay

## 2014-07-31 DIAGNOSIS — L700 Acne vulgaris: Secondary | ICD-10-CM

## 2014-07-31 MED ORDER — MINOCYCLINE HCL 100 MG PO CAPS: 100.0000 mg | ORAL_CAPSULE | Freq: Every day | ORAL | Status: DC

## 2014-07-31 NOTE — Telephone Encounter (Signed)
Last seen 03/30/14  Christy 

## 2014-09-14 ENCOUNTER — Other Ambulatory Visit: Payer: Self-pay | Admitting: Nurse Practitioner

## 2014-09-14 NOTE — Telephone Encounter (Signed)
Last seen 03/30/14, last filled 08/15/14. Route to pool A, [facility]

## 2014-09-15 NOTE — Telephone Encounter (Signed)
Called to [facility]. Patient notified 

## 2014-10-12 ENCOUNTER — Telehealth: Payer: Self-pay | Admitting: *Deleted

## 2014-10-12 DIAGNOSIS — G4700 Insomnia, unspecified: Secondary | ICD-10-CM

## 2014-10-12 MED ORDER — ALPRAZOLAM 0.25 MG PO TABS: 0.2500 mg | ORAL_TABLET | Freq: Every evening | ORAL | Status: DC | PRN

## 2014-10-12 NOTE — Telephone Encounter (Signed)
Patient would like a refill on her xanax. She knows its not due till next week but wanted to call early so we could get started on it

## 2014-10-12 NOTE — Telephone Encounter (Signed)
Please call in xanax 0.25mg  nightly prn- #30  with 1 refills

## 2014-10-13 NOTE — Telephone Encounter (Signed)
rx called into pharmacy

## 2014-10-16 ENCOUNTER — Other Ambulatory Visit: Payer: Self-pay | Admitting: *Deleted

## 2014-10-16 NOTE — Telephone Encounter (Signed)
Needs a refill on her ambien. Last seen on 03-30-14. Rx last filled on 09-15-14. Please advise

## 2014-10-17 MED ORDER — ZOLPIDEM TARTRATE 10 MG PO TABS: 10.0000 mg | ORAL_TABLET | Freq: Every evening | ORAL | Status: DC | PRN

## 2014-10-17 NOTE — Telephone Encounter (Signed)
Please call in ambien with 1 refills 

## 2014-10-17 NOTE — Telephone Encounter (Signed)
rx called into pharmacy

## 2014-11-07 ENCOUNTER — Other Ambulatory Visit: Payer: Self-pay | Admitting: Family

## 2014-11-08 NOTE — Telephone Encounter (Signed)
Last seen 03/30/14  [facility]

## 2014-11-20 ENCOUNTER — Other Ambulatory Visit: Payer: Self-pay | Admitting: *Deleted

## 2014-11-20 MED ORDER — TRETINOIN 0.025 % EX CREA: TOPICAL_CREAM | CUTANEOUS | Status: DC

## 2014-12-13 ENCOUNTER — Ambulatory Visit (INDEPENDENT_AMBULATORY_CARE_PROVIDER_SITE_OTHER): Payer: BLUE CROSS/BLUE SHIELD | Admitting: Diagnostic Neuroimaging

## 2014-12-13 ENCOUNTER — Ambulatory Visit (INDEPENDENT_AMBULATORY_CARE_PROVIDER_SITE_OTHER): Payer: BLUE CROSS/BLUE SHIELD

## 2014-12-13 ENCOUNTER — Encounter: Payer: Self-pay | Admitting: Diagnostic Neuroimaging

## 2014-12-13 VITALS — BP 120/83 | HR 93 | Ht 61.5 in | Wt 157.8 lb

## 2014-12-13 DIAGNOSIS — G4700 Insomnia, unspecified: Secondary | ICD-10-CM

## 2014-12-13 DIAGNOSIS — G4452 New daily persistent headache (NDPH): Secondary | ICD-10-CM | POA: Diagnosis not present

## 2014-12-13 DIAGNOSIS — H539 Unspecified visual disturbance: Secondary | ICD-10-CM

## 2014-12-13 MED ORDER — GADOPENTETATE DIMEGLUMINE 469.01 MG/ML IV SOLN: 15.0000 mL | Freq: Once | INTRAVENOUS | Status: AC | PRN

## 2014-12-13 NOTE — Progress Notes (Signed)
[facility]  PATIENT: Erica Rodriguez DOB: 28-Sep-1985  REFERRING CLINICIAN: Despina AriasYen Le HISTORY FROM: patient  REASON FOR VISIT: new consult    HISTORICAL  CHIEF COMPLAINT:  Chief Complaint  Patient presents with  . New Evaluation    new headaches with tinnitus     HISTORY OF PRESENT ILLNESS:   29 year old right-handed female here for evaluation of headache, eye pain, swollen optic nerves, retro-orbital pain. One and a half months ago patient noticed right-sided headache, with pressure and pulsating pushing sensation behind her right eye. She had some sensitivity to light, nausea, without phonophobia or vomiting. Initially she thought she was pregnant and took a home pregnancy test which was negative.  Symptoms have been constant since that time. She has chronic insomnia and has been on Ambien for several years. She has been on minocycline and trentinon cream for past 6 months.  Patient went to optometrist for evaluation was found to have mild optic nerve head edema bilaterally.    REVIEW OF SYSTEMS: Full 14 system review of systems performed and notable only for fatigue blurred vision eye pain spinning sensation moles feeling hot headache insomnia.   ALLERGIES: No Known Allergies  HOME MEDICATIONS: Outpatient Prescriptions Prior to Visit  Medication Sig Dispense Refill  . minocycline (MINOCIN,DYNACIN) 100 MG capsule TAKE 1 CAPSULE (100 MG TOTAL) BY MOUTH DAILY. 90 capsule 2  . tretinoin (RETIN-A) 0.025 % cream Apply bb size amt at bedtime 45 g 0  . zolpidem (AMBIEN) 10 MG tablet Take 1 tablet (10 mg total) by mouth at bedtime as needed. for sleep 30 tablet 1  . ALPRAZolam (XANAX) 0.25 MG tablet Take 1 tablet (0.25 mg total) by mouth at bedtime as needed for sleep. (Patient not taking: Reported on 12/13/2014) 30 tablet 1  . desoximetasone (TOPICORT) 0.25 % cream Apply 1 application topically 2 (two) times daily. 30 g 0  . triamcinolone (KENALOG) 0.025 % ointment  Apply 1 application topically 2 (two) times daily. 30 g 0  . zolpidem (AMBIEN) 10 MG tablet TAKE ONE TABLET BY MOUTH AT BEDTIME AS NEEDED 30 tablet 0   No facility-administered medications prior to visit.    PAST MEDICAL HISTORY: Past Medical History  Diagnosis Date  . Depression   . Mild preeclampsia     PAST SURGICAL HISTORY: Past Surgical History  Procedure Laterality Date  . Cesarean section    . Wisdom tooth extraction    . Foot surgery Bilateral     FAMILY HISTORY: Family History  Problem Relation Age of Onset  . Thyroid disease Mother   . Hyperlipidemia Mother   . Hypertension Father   . Kidney disease Father   . Cirrhosis Father   . Diabetes Maternal Grandmother   . Stroke Maternal Grandmother   . Stroke Maternal Grandfather     SOCIAL HISTORY:  History   Social History  . Marital Status: Married    Spouse Name: Casimiro NeedleMichael  . Number of Children: 1  . Years of Education: 12   Occupational History  . Not on file.   Social History Main Topics  . Smoking status: Never Smoker   . Smokeless tobacco: Not on file  . Alcohol Use: No  . Drug Use: No  . Sexual Activity: Yes   Other Topics Concern  . Not on file   Social History Narrative   Lives at home with husband and son   Drinks 1 soda a day      PHYSICAL EXAM  Filed  Vitals:   12/13/14 0816  BP: 120/83  Pulse: 93  Height: 5' 1.5" (1.562 m)  Weight: 157 lb 12.8 oz (71.578 kg)    Body mass index is 29.34 kg/(m^2).   Visual Acuity Screening   Right eye Left eye Both eyes  Without correction:     With correction: 20/50 20/50     No flowsheet data found.  GENERAL EXAM: Patient is in no distress; well developed, nourished and groomed; neck is supple  CARDIOVASCULAR: Regular rate and rhythm, no murmurs, no carotid bruits  NEUROLOGIC: MENTAL STATUS: awake, alert, oriented to person, place and time, recent and remote memory intact, normal attention and concentration, language fluent,  comprehension intact, naming intact, fund of knowledge appropriate CRANIAL NERVE: no papilledema on fundoscopic exam, pupils equal and reactive to light, visual fields full to confrontation, extraocular muscles intact, no nystagmus, facial sensation and strength symmetric, hearing intact, palate elevates symmetrically, uvula midline, shoulder shrug symmetric, tongue midline. MOTOR: normal bulk and tone, full strength in the BUE, BLE SENSORY: normal and symmetric to light touch, pinprick, temperature, vibration  COORDINATION: finger-nose-finger, fine finger movements normal REFLEXES: deep tendon reflexes present and symmetric GAIT/STATION: narrow based gait; able to walk tandem; romberg is negative    DIAGNOSTIC DATA (LABS, IMAGING, TESTING) - I reviewed patient records, labs, notes, testing and imaging myself where available.  No results found for: WBC, HGB, HCT, MCV, PLT No results found for: NA, K, CL, CO2, GLUCOSE, BUN, CREATININE, CALCIUM, PROT, ALBUMIN, AST, ALT, ALKPHOS, BILITOT, GFRNONAA, GFRAA No results found for: CHOL, HDL, LDLCALC, LDLDIRECT, TRIG, CHOLHDL No results found for: WUJW1X No results found for: VITAMINB12 No results found for: TSH     ASSESSMENT AND PLAN  29 y.o. year old female here with new onset headache, blurred vision, optic nerve edema, since April 2016. Will check further workup. Then treatment based on results. May need LP for opening pressure measurement after MRI.    Ddx: pseudotumor cerebri, migraine variant, tension HA, metabolic, pregnancy, other secondary HA  PLAN:  Orders Placed This Encounter  Procedures  . MR Brain W Wo Contrast  . CBC with Differential/Platelet  . Comprehensive metabolic panel  . Vitamin B12  . TSH  . Hemoglobin A1c  . POCT urine pregnancy    Return in about 1 month (around 01/12/2015).    Suanne Marker, MD 12/13/2014, 9:09 AM Certified in Neurology, Neurophysiology and Neuroimaging  [facility] [address], [address] [place], [place] [address] [phone]

## 2014-12-14 ENCOUNTER — Telehealth: Payer: Self-pay | Admitting: Diagnostic Neuroimaging

## 2014-12-14 ENCOUNTER — Other Ambulatory Visit: Payer: Self-pay | Admitting: *Deleted

## 2014-12-14 LAB — CBC WITH DIFFERENTIAL/PLATELET
Basophils Absolute: 0 10*3/uL (ref 0.0–0.2)
Basos: 0 %
EOS (ABSOLUTE): 0.2 10*3/uL (ref 0.0–0.4)
EOS: 3 %
HEMOGLOBIN: 13.5 g/dL (ref 11.1–15.9)
Hematocrit: 41.7 % (ref 34.0–46.6)
IMMATURE GRANS (ABS): 0 10*3/uL (ref 0.0–0.1)
Immature Granulocytes: 0 %
LYMPHS ABS: 1.3 10*3/uL (ref 0.7–3.1)
LYMPHS: 24 %
MCH: 31.3 pg (ref 26.6–33.0)
MCHC: 32.4 g/dL (ref 31.5–35.7)
MCV: 97 fL (ref 79–97)
Monocytes Absolute: 0.4 10*3/uL (ref 0.1–0.9)
Monocytes: 7 %
NEUTROS PCT: 66 %
Neutrophils Absolute: 3.5 10*3/uL (ref 1.4–7.0)
PLATELETS: 361 10*3/uL (ref 150–379)
RBC: 4.31 x10E6/uL (ref 3.77–5.28)
RDW: 12.9 % (ref 12.3–15.4)
WBC: 5.3 10*3/uL (ref 3.4–10.8)

## 2014-12-14 LAB — COMPREHENSIVE METABOLIC PANEL
ALT: 12 IU/L (ref 0–32)
AST: 14 IU/L (ref 0–40)
Albumin/Globulin Ratio: 1.5 (ref 1.1–2.5)
Albumin: 4.4 g/dL (ref 3.5–5.5)
Alkaline Phosphatase: 102 IU/L (ref 39–117)
BILIRUBIN TOTAL: 0.4 mg/dL (ref 0.0–1.2)
BUN/Creatinine Ratio: 17 (ref 8–20)
BUN: 11 mg/dL (ref 6–20)
CO2: 25 mmol/L (ref 18–29)
Calcium: 9.7 mg/dL (ref 8.7–10.2)
Chloride: 101 mmol/L (ref 97–108)
Creatinine, Ser: 0.63 mg/dL (ref 0.57–1.00)
GFR calc Af Amer: 140 mL/min/{1.73_m2} (ref >59)
GFR calc non Af Amer: 122 mL/min/{1.73_m2} (ref >59)
GLUCOSE: 90 mg/dL (ref 65–99)
Globulin, Total: 2.9 g/dL (ref 1.5–4.5)
POTASSIUM: 4.4 mmol/L (ref 3.5–5.2)
Sodium: 140 mmol/L (ref 134–144)
Total Protein: 7.3 g/dL (ref 6.0–8.5)

## 2014-12-14 LAB — HEMOGLOBIN A1C
Est. average glucose Bld gHb Est-mCnc: 117 mg/dL
Hgb A1c MFr Bld: 5.7 % — ABNORMAL HIGH (ref 4.8–5.6)

## 2014-12-14 LAB — TSH: TSH: 2.97 u[IU]/mL (ref 0.450–4.500)

## 2014-12-14 LAB — VITAMIN B12: Vitamin B-12: 269 pg/mL (ref 211–946)

## 2014-12-14 MED ORDER — ZOLPIDEM TARTRATE 10 MG PO TABS: 10.0000 mg | ORAL_TABLET | Freq: Every evening | ORAL | Status: DC | PRN

## 2014-12-14 NOTE — Telephone Encounter (Signed)
Patient last seen in office on 03-30-14. Rx last filled on 4-12 for #30 with 1 RF. Please advise and route to Pool B

## 2014-12-14 NOTE — Telephone Encounter (Signed)
Ambien refills on vm.

## 2014-12-14 NOTE — Telephone Encounter (Signed)
Patient is calling to get lab and MRI results.

## 2014-12-15 ENCOUNTER — Encounter: Payer: Self-pay | Admitting: Family Medicine

## 2014-12-15 ENCOUNTER — Ambulatory Visit (INDEPENDENT_AMBULATORY_CARE_PROVIDER_SITE_OTHER): Payer: BLUE CROSS/BLUE SHIELD | Admitting: Family Medicine

## 2014-12-15 VITALS — BP 124/87 | HR 99 | Temp 99.2°F | Ht 61.5 in | Wt 155.0 lb

## 2014-12-15 DIAGNOSIS — R739 Hyperglycemia, unspecified: Secondary | ICD-10-CM | POA: Diagnosis not present

## 2014-12-15 NOTE — Telephone Encounter (Signed)
Spoke to the pt on the phone and informed her of her lab results. I told her her MRI was normal and her lab results were normal except for her HgbA1C. I explained that she was in the pre-diabetes category and she was questioning if this could be causing her headaches. I explained that if her BG was spiking high or low and her body was not used to it, it could cause headaches. She also asked about the next step. I reviewed Dr. Fonda Kinder last office note and explained to her that he was thinking the next step would be an LP. She told me that she would think about getting that over the weekend and call me back Monday if she decided to get that done. She thanked me for calling and asked that I release the results to my chart for her.

## 2014-12-15 NOTE — Progress Notes (Signed)
   Subjective:    Patient ID: Erica Rodriguez, female    DOB: 05-Mar-1986, 29 y.o.   MRN: [id number]  HPI Pt went to optometrist with decr vision and was told optic nerve swollen; saw neuro and had neg MRI and labs.  A1C was borderline but FBS was normal.  Still has pain in eye  Patient Active Problem List   Diagnosis Date Noted  . Insomnia 10/07/2012  . GAD (generalized anxiety disorder) 10/07/2012   Outpatient Encounter Prescriptions as of 12/15/2014  Medication Sig  . ALPRAZolam (XANAX) 0.25 MG tablet Take 1 tablet (0.25 mg total) by mouth at bedtime as needed for sleep.  . Ibuprofen 200 MG CAPS Take by mouth.  . minocycline (MINOCIN,DYNACIN) 100 MG capsule TAKE 1 CAPSULE (100 MG TOTAL) BY MOUTH DAILY.  Marland Kitchen tretinoin (RETIN-A) 0.025 % cream Apply bb size amt at bedtime  . zolpidem (AMBIEN) 10 MG tablet Take 1 tablet (10 mg total) by mouth at bedtime as needed. for sleep   No facility-administered encounter medications on file as of 12/15/2014.       Review of Systems  Constitutional: Negative.   Respiratory: Negative.   Endocrine: Negative.   Neurological: Positive for headaches.       Objective:   Physical Exam  Constitutional: She is oriented to person, place, and time. She appears well-developed and well-nourished.  Eyes: Conjunctivae and EOM are normal.  Neck: Normal range of motion. Neck supple.  Cardiovascular: Normal rate, regular rhythm and normal heart sounds.   Pulmonary/Chest: Effort normal and breath sounds normal.  Abdominal: Soft. Bowel sounds are normal.  Musculoskeletal: Normal range of motion.  Neurological: She is alert and oriented to person, place, and time. She has normal reflexes.  Skin: Skin is warm and dry.  Psychiatric: She has a normal mood and affect. Her behavior is normal. Thought content normal.     BP 124/87 mmHg  Pulse 99  Temp(Src) 99.2 F (37.3 C) (Oral)  Ht 5' 1.5" (1.562 m)  Wt 155 lb (70.308 kg)  BMI 28.82 kg/m2      Assessment &  Plan:  1. Hyperglycemia Does not meet criteria for DM; at worst may be pre-diabetes.  Discussed life style modification. May have opthalmologic migraine; check back with neurology.  Frederica Kuster MD

## 2015-01-09 ENCOUNTER — Ambulatory Visit: Payer: BLUE CROSS/BLUE SHIELD | Admitting: Diagnostic Neuroimaging

## 2015-02-16 ENCOUNTER — Encounter: Payer: Self-pay | Admitting: Medical

## 2015-02-16 ENCOUNTER — Ambulatory Visit (INDEPENDENT_AMBULATORY_CARE_PROVIDER_SITE_OTHER): Payer: BLUE CROSS/BLUE SHIELD | Admitting: Medical

## 2015-02-16 ENCOUNTER — Other Ambulatory Visit: Payer: Self-pay | Admitting: Medical

## 2015-02-16 ENCOUNTER — Telehealth: Payer: Self-pay | Admitting: Medical

## 2015-02-16 VITALS — BP 120/76 | HR 80 | Temp 99.6°F | Resp 16 | Ht 61.5 in | Wt 159.0 lb

## 2015-02-16 DIAGNOSIS — J029 Acute pharyngitis, unspecified: Secondary | ICD-10-CM | POA: Diagnosis not present

## 2015-02-16 DIAGNOSIS — J069 Acute upper respiratory infection, unspecified: Secondary | ICD-10-CM

## 2015-02-16 LAB — POCT RAPID STREP A (OFFICE): Rapid Strep A Screen: NEGATIVE

## 2015-02-16 MED ORDER — ONDANSETRON HCL 4 MG PO TABS: 4.0000 mg | ORAL_TABLET | Freq: Three times a day (TID) | ORAL | Status: DC | PRN

## 2015-02-16 NOTE — Telephone Encounter (Signed)
pls let her know that strep was negative.  Use salt water gargles, warm fluids, can use ibuprofen 3 tablets at a time TID for aches, sore throat, fever.    Rest, hydrate well, and if worse or not improving, call.  Does she want me to send zofran for nausea?  If so let me know, otherwise, can use Emetrol OTC for nausea too.

## 2015-02-16 NOTE — Progress Notes (Signed)
Subjective:  Erica Rodriguez is a 29 y.o. female who presents for illness.  New patient today but is a new employee here as well.   Symptoms include 5 days of malaise, headache, but started getting sore throat yesterday, some nausea, right ear discomfort.   Denies fever, cough, congestion, vomiting, diarrhea, chills, sweats, or aches.   Treatment to date: Advil.  Her 7yo son has had some URI symptoms, but is prone to strep.  No other aggravating or relieving factors.  No other c/o.  ROS as in subjective.   Objective: Filed Vitals:   02/16/15 1130  BP: 120/76  Pulse: 80  Temp: 99.6 F (37.6 C)  Resp: 16    General appearance: Alert, WD/WN, no distress, mildly ill appearing                             Skin: warm, no rash                           Head: no sinus tenderness                            Eyes: conjunctiva normal, corneas clear, PERRLA                            Ears: pearly TMs, external ear canals normal                          Nose: septum midline, turbinates swollen, with erythema and no discharge             Mouth/throat: MMM, tongue normal, mild pharyngeal erythema                           Neck: supple, no adenopathy, no thyromegaly, nontender                          Heart: RRR, normal S1, S2, no murmurs                         Lungs: CTA bilaterally, no wheezes, rales, or rhonchi    Strep negative    Assessment: Encounter Diagnoses  Name Primary?  . Sore throat Yes  . Acute upper respiratory infection      Plan: Discussed diagnosis and treatment of URI.  Suggested symptomatic OTC remedies.  Nasal saline spray for congestion.  Tylenol or Ibuprofen OTC for fever and malaise.  Call/return in 2-3 days if symptoms aren't resolving.

## 2015-02-16 NOTE — Telephone Encounter (Signed)
Went over your recommendations with patient. She would like zofran to be called to [facility] in [place] (in chart), thanks.

## 2015-02-20 ENCOUNTER — Other Ambulatory Visit: Payer: Self-pay | Admitting: Medical

## 2015-02-20 MED ORDER — AMOXICILLIN 875 MG PO TABS: 875.0000 mg | ORAL_TABLET | Freq: Two times a day (BID) | ORAL | Status: DC

## 2015-03-16 ENCOUNTER — Ambulatory Visit (INDEPENDENT_AMBULATORY_CARE_PROVIDER_SITE_OTHER): Payer: BLUE CROSS/BLUE SHIELD | Admitting: Family Medicine

## 2015-03-16 ENCOUNTER — Encounter: Payer: Self-pay | Admitting: Family Medicine

## 2015-03-16 VITALS — BP 122/72 | HR 88 | Temp 98.9°F

## 2015-03-16 DIAGNOSIS — G43019 Migraine without aura, intractable, without status migrainosus: Secondary | ICD-10-CM

## 2015-03-16 MED ORDER — SUMATRIPTAN SUCCINATE 100 MG PO TABS: ORAL_TABLET | ORAL | Status: DC

## 2015-03-16 NOTE — Progress Notes (Signed)
   Subjective:    Patient ID: Erica Rodriguez, female    DOB: 1986/04/24, 29 y.o.   MRN: [id number]  HPI She is here for a 5 day history of headache behind her right eye that feels like a sharp pain. She had associated nausea Tuesday morning. She reports that her vision in her right eye is blurry. Denies double vision or eye pain. The pain has been waxing and waning over the past 5 days and is currently an 8 out of 10 which it was also this severe at onset. She denies fever, chills, unexplained weight loss, dizziness. She has a history of similar headache in April or May that triggered her to see the eye doctor and neurologist. At that time the headache lasted for 2 months and then resolved. She saw her eye doctor who told her that she had fluid or swelling behind her eye. She then saw a neurologist who ordered an MRI and that was negative.  She has been taking 800 mg of ibuprofen every 4 hours and Excedrin Migraine one pill daily.    Review of Systems Pertinent positives and negatives in the history of present illness.    Objective:   Physical Exam  Constitutional: She is oriented to person, place, and time. She appears well-developed and well-nourished. No distress.  HENT:  Head: Normocephalic and atraumatic.  Eyes: Conjunctivae and EOM are normal. Pupils are equal, round, and reactive to light.  Neck: Normal range of motion and full passive range of motion without pain. Neck supple.  Neurological: She is alert and oriented to person, place, and time. She has normal strength. No cranial nerve deficit or sensory deficit. She displays a negative Romberg sign.  Psychiatric: She has a normal mood and affect. Her speech is normal and behavior is normal. Judgment and thought content normal. Cognition and memory are normal.          Assessment & Plan:  Intractable migraine without aura and without status migrainosus  Discussed that I reviewed her MRI results however I am unable to see the  neurologist note. Her MRI was negative. She has been taking NSAIDs more often than recommended. Discussed that she should stop taking all NSAIDs for now and Start taking Tylenol, 1000 mg twice daily. She will also try taking Imitrex and see if this helps. She will call and schedule an appointment with her neurologist to follow up on this.

## 2015-03-16 NOTE — Patient Instructions (Signed)
Stop taking NSAIDs, including aspirin, Motrin, ibuprofen, Aleve, Advil. Only take Tylenol 1000 mg twice a day and hydrate with water. We will prescribe Imitrex for your headache. You need to call your neurologist and get in to see them as soon as they can get you in.    Migraine Headache A migraine headache is an intense, throbbing pain on one or both sides of your head. A migraine can last for 30 minutes to several hours. CAUSES  The exact cause of a migraine headache is not always known. However, a migraine may be caused when nerves in the brain become irritated and release chemicals that cause inflammation. This causes pain. Certain things may also trigger migraines, such as:  Alcohol.  Smoking.  Stress.  Menstruation.  Aged cheeses.  Foods or drinks that contain nitrates, glutamate, aspartame, or tyramine.  Lack of sleep.  Chocolate.  Caffeine.  Hunger.  Physical exertion.  Fatigue.  Medicines used to treat chest pain (nitroglycerine), birth control pills, estrogen, and some blood pressure medicines. SIGNS AND SYMPTOMS  Pain on one or both sides of your head.  Pulsating or throbbing pain.  Severe pain that prevents daily activities.  Pain that is aggravated by any physical activity.  Nausea, vomiting, or both.  Dizziness.  Pain with exposure to bright lights, loud noises, or activity.  General sensitivity to bright lights, loud noises, or smells. Before you get a migraine, you may get warning signs that a migraine is coming (aura). An aura may include:  Seeing flashing lights.  Seeing bright spots, halos, or zigzag lines.  Having tunnel vision or blurred vision.  Having feelings of numbness or tingling.  Having trouble talking.  Having muscle weakness. DIAGNOSIS  A migraine headache is often diagnosed based on:  Symptoms.  Physical exam.  A CT scan or MRI of your head. These imaging tests cannot diagnose migraines, but they can help rule  out other causes of headaches. TREATMENT Medicines may be given for pain and nausea. Medicines can also be given to help prevent recurrent migraines.  HOME CARE INSTRUCTIONS  Only take over-the-counter or prescription medicines for pain or discomfort as directed by your health care provider. The use of long-term narcotics is not recommended.  Lie down in a dark, quiet room when you have a migraine.  Keep a journal to find out what may trigger your migraine headaches. For example, write down:  What you eat and drink.  How much sleep you get.  Any change to your diet or medicines.  Limit alcohol consumption.  Quit smoking if you smoke.  Get 7-9 hours of sleep, or as recommended by your health care provider.  Limit stress.  Keep lights dim if bright lights bother you and make your migraines worse. SEEK IMMEDIATE MEDICAL CARE IF:   Your migraine becomes severe.  You have a fever.  You have a stiff neck.  You have vision loss.  You have muscular weakness or loss of muscle control.  You start losing your balance or have trouble walking.  You feel faint or pass out.  You have severe symptoms that are different from your first symptoms. MAKE SURE YOU:   Understand these instructions.  Will watch your condition.  Will get help right away if you are not doing well or get worse. Document Released: 06/23/2005 Document Revised: 11/07/2013 Document Reviewed: 02/28/2013 [facility] Patient Information 2015 [place], [place]. This information is not intended to replace advice given to you by your health care provider. Make sure  you discuss any questions you have with your health care provider.

## 2015-04-04 ENCOUNTER — Ambulatory Visit (INDEPENDENT_AMBULATORY_CARE_PROVIDER_SITE_OTHER): Payer: BLUE CROSS/BLUE SHIELD | Admitting: Diagnostic Neuroimaging

## 2015-04-04 ENCOUNTER — Encounter: Payer: Self-pay | Admitting: Diagnostic Neuroimaging

## 2015-04-04 VITALS — BP 124/74 | HR 95 | Ht 61.5 in | Wt 159.0 lb

## 2015-04-04 DIAGNOSIS — G43109 Migraine with aura, not intractable, without status migrainosus: Secondary | ICD-10-CM

## 2015-04-04 MED ORDER — DICLOFENAC POTASSIUM(MIGRAINE) 50 MG PO PACK: 50.0000 mg | PACK | ORAL | Status: DC | PRN

## 2015-04-04 MED ORDER — TOPIRAMATE 50 MG PO TABS: 50.0000 mg | ORAL_TABLET | Freq: Two times a day (BID) | ORAL | Status: DC

## 2015-04-04 NOTE — Patient Instructions (Signed)
Thank you for coming to see [place] at [facility]. I hope we have been able to provide you high quality care today.  You may receive a patient satisfaction survey over the next few weeks. We would appreciate your feedback and comments so that we may continue to improve ourselves and the health of our patients.  - try topiramate 11m at bedtime x 2 weeks; then increase to twice a day - try [place] as needed for breakthrough headache   ~~~~~~~~~~~~~~~~~~~~~~~~~~~~~~~~~~~~~~~~~~~~~~~~~~~~~~~~~~~~~~~~~  DR. PENUMALLI'S GUIDE TO HAPPY AND HEALTHY LIVING These are some of my general health and wellness recommendations. Some of them may apply to you better than others. Please use common sense as you try these suggestions and feel free to ask me any questions.   ACTIVITY/FITNESS Mental, social, emotional and physical stimulation are very important for brain and body health. Try learning a new activity (arts, music, language, sports, games).  Keep moving your body to the best of your abilities. You can do this at home, inside or outside, the park, community center, gym or anywhere you like. Consider a physical therapist or personal trainer to get started. Consider the app Sworkit. Fitness trackers such as smart-watches, smart-phones or Fitbits can help as well.   NUTRITION Eat more plants: colorful vegetables, nuts, seeds and berries.  Eat less sugar, salt, preservatives and processed foods.  Avoid toxins such as cigarettes and alcohol.  Drink water when you are thirsty. Warm water with a slice of lemon is an excellent morning drink to start the day.  Consider these websites for more information The Nutrition Source (hhttps://www.henry-hernandez.biz/ [employer] Nutrition (wWindowBlog.ch   RELAXATION Consider practicing mindfulness meditation or other relaxation techniques such as deep breathing, prayer, yoga, tai chi, massage. See  website mindful.org or the apps Headspace or Calm to help get started.   SLEEP Try to get at least 7-8+ hours sleep per day. Regular exercise and reduced caffeine will help you sleep better. Practice good sleep hygeine techniques. See website sleep.org for more information.   PLANNING Prepare estate planning, living will, healthcare POA documents. Sometimes this is best planned with the help of an attorney. Theconversationproject.org and agingwithdignity.org are excellent resources.

## 2015-04-04 NOTE — Addendum Note (Signed)
Addended by: [place] Pink on: 04/04/2015 03:40 PM   Modules accepted: Medications

## 2015-04-04 NOTE — Progress Notes (Signed)
[facility]  PATIENT: Erica Rodriguez DOB: 1985-07-20  REFERRING CLINICIAN:  HISTORY FROM: patient  REASON FOR VISIT: follow up   HISTORICAL  CHIEF COMPLAINT:  Chief Complaint  Patient presents with  . Headache    rm 6  . Follow-up    3 month    HISTORY OF PRESENT ILLNESS:   UPDATE 04/04/15: Since last visit, has continued with 6-7 mild HA per month; and occ severe right retroorbital severe HA, with nausea, sens to light; some "messed up" vision and spots before some HA. Dr. Cathey Endow ophthalmology eval'd patient, and no sig papilledema noted. MRI brain and labs unremarkable. Tried imitrex per PCP and had hot feeling, anxiety and flushing, with minimal benefit. Still not sleeping well at night, in spite of nightly ambien.   PRIOR HPI (12/13/14): 29 year old right-handed female here for evaluation of headache, eye pain, swollen optic nerves, retro-orbital pain. One and a half months ago patient noticed right-sided headache, with pressure and pulsating pushing sensation behind her right eye. She had some sensitivity to light, nausea, without phonophobia or vomiting. Initially she thought she was pregnant and took a home pregnancy test which was negative. Symptoms have been constant since that time. She has chronic insomnia and has been on Ambien for several years. She has been on minocycline and trentinon cream for past 6 months. Patient went to optometrist for evaluation was found to have mild optic nerve head edema bilaterally.    REVIEW OF SYSTEMS: Full 14 system review of systems performed and notable only for wt gain excess sweating leg swelling.      ALLERGIES: No Known Allergies  HOME MEDICATIONS: Outpatient Prescriptions Prior to Visit  Medication Sig Dispense Refill  . tretinoin (RETIN-A) 0.025 % cream Apply bb size amt at bedtime 45 g 0  . zolpidem (AMBIEN) 10 MG tablet Take 1 tablet (10 mg total) by mouth at bedtime as needed. for sleep 30 tablet 3  .  SUMAtriptan (IMITREX) 100 MG tablet Take 100 mg by mouth for headache and you may repeat dose after 2 hours if needed. Do not take more than  q24h (Patient not taking: Reported on 04/04/2015) 10 tablet 0  . acetaminophen (TYLENOL) 325 MG suppository Place 975 mg rectally every 4 (four) hours as needed.    . ALPRAZolam (XANAX) 0.25 MG tablet Take 1 tablet (0.25 mg total) by mouth at bedtime as needed for sleep. 30 tablet 1  . Ibuprofen 200 MG CAPS Take by mouth.    . minocycline (MINOCIN,DYNACIN) 100 MG capsule TAKE 1 CAPSULE (100 MG TOTAL) BY MOUTH DAILY. 90 capsule 2  . ondansetron (ZOFRAN) 4 MG tablet Take 1 tablet (4 mg total) by mouth every 8 (eight) hours as needed for nausea or vomiting. (Patient not taking: Reported on 03/16/2015) 20 tablet 0   No facility-administered medications prior to visit.    PAST MEDICAL HISTORY: Past Medical History  Diagnosis Date  . Depression   . Mild preeclampsia     PAST SURGICAL HISTORY: Past Surgical History  Procedure Laterality Date  . Cesarean section    . Wisdom tooth extraction    . Foot surgery Bilateral     FAMILY HISTORY: Family History  Problem Relation Age of Onset  . Thyroid disease Mother   . Hyperlipidemia Mother   . Hypertension Father   . Kidney disease Father   . Cirrhosis Father   . Diabetes Maternal Grandmother   . Stroke Maternal Grandmother   . Stroke Maternal Grandfather  SOCIAL HISTORY:  Social History   Social History  . Marital Status: Married    Spouse Name: Casimiro Needle  . Number of Children: 1  . Years of Education: 12   Occupational History  .      clincal registration   Social History Main Topics  . Smoking status: Never Smoker   . Smokeless tobacco: Never Used  . Alcohol Use: No  . Drug Use: No  . Sexual Activity: Yes   Other Topics Concern  . Not on file   Social History Narrative   Lives at home with husband and son   Drinks 1 soda a day      PHYSICAL EXAM  Filed Vitals:    04/04/15 1303  BP: 124/74  Pulse: 95  Height: 5' 1.5" (1.562 m)  Weight: 159 lb (72.122 kg)    Body mass index is 29.56 kg/(m^2).  No exam data present  No flowsheet data found.  GENERAL EXAM: Patient is in no distress; well developed, nourished and groomed; neck is supple  CARDIOVASCULAR: Regular rate and rhythm, no murmurs, no carotid bruits  NEUROLOGIC: MENTAL STATUS: awake, alert, language fluent, comprehension intact, naming intact, fund of knowledge appropriate CRANIAL NERVE: no papilledema on fundoscopic exam, pupils equal and reactive to light, visual fields full to confrontation, extraocular muscles intact, no nystagmus, facial sensation and strength symmetric, hearing intact, palate elevates symmetrically, uvula midline, shoulder shrug symmetric, tongue midline. MOTOR: normal bulk and tone, full strength in the BUE, BLE SENSORY: normal and symmetric to light touch, temperature, vibration  COORDINATION: finger-nose-finger, fine finger movements normal REFLEXES: deep tendon reflexes present and symmetric GAIT/STATION: narrow based gait; able to walk tandem; romberg is negative    DIAGNOSTIC DATA (LABS, IMAGING, TESTING) - I reviewed patient records, labs, notes, testing and imaging myself where available.  Lab Results  Component Value Date   WBC 5.3 12/13/2014   HCT 41.7 12/13/2014      Component Value Date/Time   NA 140 12/13/2014 0939   K 4.4 12/13/2014 0939   CL 101 12/13/2014 0939   CO2 25 12/13/2014 0939   GLUCOSE 90 12/13/2014 0939   BUN 11 12/13/2014 0939   CREATININE 0.63 12/13/2014 0939   CALCIUM 9.7 12/13/2014 0939   PROT 7.3 12/13/2014 0939   AST 14 12/13/2014 0939   ALT 12 12/13/2014 0939   ALKPHOS 102 12/13/2014 0939   BILITOT 0.4 12/13/2014 0939   GFRNONAA 122 12/13/2014 0939   GFRAA 140 12/13/2014 0939   No results found for: CHOL, HDL, LDLCALC, LDLDIRECT, TRIG, CHOLHDL Lab Results  Component Value Date   HGBA1C 5.7* 12/13/2014   Lab  Results  Component Value Date   VITAMINB12 269 12/13/2014   Lab Results  Component Value Date   TSH 2.970 12/13/2014    12/13/14 MRI brain [I reviewed images myself and agree with interpretation. -VRP]  - normal    ASSESSMENT AND PLAN  29 y.o. year old female here with new onset headache, blurred vision, possible optic nerve edema, since April 2016. MRI, labs, and eye exam are unremarkable. Now likely represents migraine with aura.    Dx:   Migraine with aura and without status migrainosus, not intractable     PLAN: - TPX for migraine prevention - cambia prn HA - health, sleep hygiene and nutrition advice reviewed  Meds ordered this encounter  Medications  . topiramate (TOPAMAX) 50 MG tablet    Sig: Take 1 tablet (50 mg total) by mouth 2 (two) times daily.  Dispense:  60 tablet    Refill:  12  . Diclofenac Potassium (CAMBIA) 50 MG PACK    Sig: Take 50 mg by mouth as needed (for migraine; max 1 packet per day).    Dispense:  9 each    Refill:  3   Return in about 3 months (around 07/04/2015).    Suanne Marker, MD 04/04/2015, 2:09 PM Certified in Neurology, Neurophysiology and Neuroimaging  [facility] [address], [address] [place], [place] [address] [phone]

## 2015-04-10 ENCOUNTER — Encounter: Payer: Self-pay | Admitting: *Deleted

## 2015-04-11 ENCOUNTER — Other Ambulatory Visit: Payer: Self-pay | Admitting: *Deleted

## 2015-04-12 MED ORDER — TRETINOIN 0.025 % EX CREA: TOPICAL_CREAM | CUTANEOUS | Status: DC

## 2015-04-12 MED ORDER — ZOLPIDEM TARTRATE 10 MG PO TABS: 10.0000 mg | ORAL_TABLET | Freq: Every evening | ORAL | Status: DC | PRN

## 2015-04-12 NOTE — Telephone Encounter (Signed)
Please call in abien with 1 refills

## 2015-04-12 NOTE — Telephone Encounter (Signed)
Called to [facility]. Patient notified 

## 2015-04-16 ENCOUNTER — Telehealth: Payer: Self-pay | Admitting: Diagnostic Neuroimaging

## 2015-04-16 ENCOUNTER — Telehealth: Payer: Self-pay | Admitting: *Deleted

## 2015-04-16 ENCOUNTER — Encounter: Payer: Self-pay | Admitting: Diagnostic Neuroimaging

## 2015-04-16 NOTE — Telephone Encounter (Signed)
Erica Rodriguez is not covered under the patient's ins plan.  Ins has been contacted and provided with clinical info asking that they grant an exception.  Request is under review.  Ref # [id number]

## 2015-04-16 NOTE — Telephone Encounter (Signed)
Patient is calling and states that her pharmacy needs and authorization to fill Rx Diclofenac Potassium 50 mg.  Thanks!

## 2015-04-16 NOTE — Telephone Encounter (Signed)
Per Dr Marjory Lies, spoke with patient and informed her she may take Minocycline and Topiramate at same time. She verbalized understanding. She inquired about Cambia and her insurance issue. Informed her that Jamey Ripa has sent message to Dr Marjory Lies. This RN will FU with Dr Marjory Lies later today and let her know.

## 2015-04-17 NOTE — Telephone Encounter (Signed)
[employer] [place] has approved the request for coverage on Cambia effective until 04/15/2016 Ref # [id number] I called the patient to advise.  Got no answer.  Left message.  As well recommended she go to [email or web address] to obtain discount voucher if she is eligible.  Asked that she call back if she has any questions.

## 2015-04-17 NOTE — Telephone Encounter (Signed)
Left voice mail for patient informing her that message was sent to Sydnee Levans tech to try generic brand per Dr Marjory Lies. Also informed her that Dr Marjory Lies inquired if she had tried a drug discount card. Also informed her that Shanda Bumps is aware of Dr Richrd Humbles question about drug discount card. Informed her that she will recieive call back in regard to Cambria/Diclofenac. Left this caller's name, number.

## 2015-05-01 ENCOUNTER — Ambulatory Visit (INDEPENDENT_AMBULATORY_CARE_PROVIDER_SITE_OTHER): Payer: BLUE CROSS/BLUE SHIELD | Admitting: Medical

## 2015-05-01 ENCOUNTER — Encounter: Payer: Self-pay | Admitting: Medical

## 2015-05-01 VITALS — BP 110/70 | HR 99 | Temp 98.1°F | Wt 158.0 lb

## 2015-05-01 DIAGNOSIS — J988 Other specified respiratory disorders: Secondary | ICD-10-CM

## 2015-05-01 MED ORDER — AZITHROMYCIN 250 MG PO TABS: ORAL_TABLET | ORAL | Status: DC

## 2015-05-01 MED ORDER — BENZONATATE 200 MG PO CAPS: 200.0000 mg | ORAL_CAPSULE | Freq: Three times a day (TID) | ORAL | Status: DC | PRN

## 2015-05-01 NOTE — Progress Notes (Signed)
Subjective: Chief Complaint  Patient presents with  . cough    deep breaths hurt her back, started friday, was really bad saturday. drainage down the back of throat, said it is making her feel sick to her stomach   Friday 5 days ago felt a little SOB. Saturday had pain in upper back with deep breathing.  Has been fatigued, and has felt worse with cough, achy over the last few days.   Feels post nasal drainage.   Has had some nausea.  Cough is like a dry heave at times.   Feels chills.  Has headache, some sinus pressure.  Denies fever, no vomiting, no body aches,no ear pain.   Has + sick contacts at work here.   Using tylenol and Nyquil.  She is a nonsmoker, no hx/o asthma.   No other aggravating or relieving factors. No other complaint.  Past Medical History  Diagnosis Date  . Depression   . Mild preeclampsia    ROS as in subjective   Objective: BP 110/70 mmHg  Pulse 99  Temp(Src) 98.1 F (36.7 C) (Oral)  Wt 158 lb (71.668 kg)  SpO2 99%  Gen: wd, wn, nad General appearance: alert, no distress, WD/WN, mildly ill appearing HEENT: normocephalic, sclerae anicteric, conjunctiva pink and moist, TMs pearly, nares with turbinate edema, worse L>R, mucoid discharge and erythema, pharynx normal, tonsils unremarkable Oral cavity: MMM, no lesions Neck: supple, no lymphadenopathy, no thyromegaly, no masses Heart: RRR, normal S1, S2, no murmurs Lungs: questionable rhonchi and dullness in left upper field, otherwise no wheezes, rhonchi, or rales Ext: no edema, no calve tenderness   Assessment: Encounter Diagnosis  Name Primary?  Marland Kitchen. Respiratory tract infection Yes    Plan: Discussed symptoms, concerns, and offered chest xray given back pain but she declines.  Begin empiric therapy for possible early pneumonia, Zpak, Tessalon Perles prn for cough, rest, hydrate well, and if not much improved within 4-5 days, then call or return.

## 2015-06-08 ENCOUNTER — Other Ambulatory Visit: Payer: Self-pay | Admitting: Nurse Practitioner

## 2015-06-11 NOTE — Telephone Encounter (Signed)
Please let pt know--she will need to be seen in clinic to discuss insomnia before any more refills can be sent.

## 2015-06-11 NOTE — Telephone Encounter (Signed)
Patient of MMM. Last seen in office on 12-15-14. Rx last filled on 05-14-15. Patient will be out of meds today. Requested Friday. Please advise. If approved please route to Pool B so nurse can phone in to pharmacy

## 2015-06-11 NOTE — Telephone Encounter (Signed)
Refill called to [facility]. lmovm for pt, NTBS before next refill.

## 2015-06-22 ENCOUNTER — Encounter: Payer: Self-pay | Admitting: Medical

## 2015-06-22 ENCOUNTER — Ambulatory Visit (INDEPENDENT_AMBULATORY_CARE_PROVIDER_SITE_OTHER): Payer: BLUE CROSS/BLUE SHIELD | Admitting: Medical

## 2015-06-22 VITALS — BP 120/78 | HR 93 | Temp 98.2°F | Resp 16 | Wt 158.0 lb

## 2015-06-22 DIAGNOSIS — J029 Acute pharyngitis, unspecified: Secondary | ICD-10-CM

## 2015-06-22 DIAGNOSIS — J069 Acute upper respiratory infection, unspecified: Secondary | ICD-10-CM | POA: Diagnosis not present

## 2015-06-22 DIAGNOSIS — R509 Fever, unspecified: Secondary | ICD-10-CM

## 2015-06-22 NOTE — Progress Notes (Signed)
  Subjective: Chief Complaint  Patient presents with  . sore throat    cough, chills. said that she is feeling like she is "freezing to death" said that she feels like she is losing her voice.     Erica DivineShauna H Rodriguez is a 29 y.o. female who presents for illness.  Started abruptly yesterday with chills, sore throat, ongoing chills and sore throat today, had low grade fever yesterday and today.  Voice is hoarse.  Felt similarly around Thanksgiving.  Felt sleepy and tired all day yesterday.   Has some cough, maybe some congestion.   No production.   Has been around sick contacts working here.   Has some body aches.   No wheezing, no NVD.  Using some Nyquil last night, tylenol this morning.  No other aggravating or relieving factors. No other complaint.  Past Medical History  Diagnosis Date  . Depression   . Mild preeclampsia    ROS as in subjective   Objective: BP 120/78 mmHg  Pulse 93  Temp(Src) 98.2 F (36.8 C) (Oral)  Resp 16  Wt 158 lb (71.668 kg)  SpO2 98%  General appearance: Alert, WD/WN, no distress, mildly ill appearing                             Skin: warm, no rash                           Head: no sinus tenderness                            Eyes: conjunctiva normal, corneas clear, PERRLA                            Ears: pearly TMs, external ear canals normal                          Nose: septum midline, turbinates swollen, with erythema and no discharge             Mouth/throat: MMM, tongue normal, mild pharyngeal erythema                           Neck: supple, no adenopathy, no thyromegaly, non tender                         Lungs: CTA bilaterally, no wheezes, rales, or rhonchi     Assessment  Encounter Diagnoses  Name Primary?  . Sore throat Yes  . Fever and chills   . Acute upper respiratory infection      Plan: Strep and flu swabs negative.   Symptoms and exam suggests viral URI.  Discussed supportive care, OTC remedies, and symptoms that would prompt recheck.   Patient was advised to call or return if worse or not improving in the next few days.  Patient voiced understanding of diagnosis, recommendations, and treatment plan.

## 2015-06-25 ENCOUNTER — Other Ambulatory Visit: Payer: Self-pay | Admitting: Medical

## 2015-06-25 MED ORDER — AMOXICILLIN-POT CLAVULANATE 875-125 MG PO TABS: 1.0000 | ORAL_TABLET | Freq: Two times a day (BID) | ORAL | Status: DC

## 2015-07-06 ENCOUNTER — Ambulatory Visit (INDEPENDENT_AMBULATORY_CARE_PROVIDER_SITE_OTHER): Payer: BLUE CROSS/BLUE SHIELD | Admitting: Nurse Practitioner

## 2015-07-06 ENCOUNTER — Encounter: Payer: Self-pay | Admitting: Nurse Practitioner

## 2015-07-06 VITALS — BP 120/85 | HR 77 | Temp 97.6°F | Ht 61.5 in | Wt 157.6 lb

## 2015-07-06 DIAGNOSIS — G4700 Insomnia, unspecified: Secondary | ICD-10-CM | POA: Diagnosis not present

## 2015-07-06 MED ORDER — ZOLPIDEM TARTRATE 10 MG PO TABS: 10.0000 mg | ORAL_TABLET | Freq: Every evening | ORAL | Status: DC | PRN

## 2015-07-06 MED ORDER — SUVOREXANT 15 MG PO TABS: 1.0000 | ORAL_TABLET | Freq: Every evening | ORAL | Status: DC | PRN

## 2015-07-06 NOTE — Progress Notes (Signed)
   Subjective:    Patient ID: Philis NettleShauna H Naill, female    DOB: 1986-05-10, 29 y.o.   MRN: [id number]  HPI Patient here today for follow up of insomnia- she is on ambien 10mg  and takes nightly- cannot sleep without them. Says that sh eusually goes  To bed around 10pm and wakes up around 2-3 AM then wants to fall back to sleep around 530 AM. She has no other medical problems and takes no other meds. Has no complaints today.    Review of Systems  Constitutional: Negative.   HENT: Negative.   Respiratory: Negative.   Cardiovascular: Negative.   Genitourinary: Negative.   Neurological: Negative.   Psychiatric/Behavioral: Negative.   All other systems reviewed and are negative.      Objective:   Physical Exam  Constitutional: She is oriented to person, place, and time. She appears well-developed and well-nourished.  Cardiovascular: Normal rate, regular rhythm and normal heart sounds.   Pulmonary/Chest: Effort normal.  Neurological: She is alert and oriented to person, place, and time. She has normal reflexes.  Skin: Skin is warm.  Psychiatric: She has a normal mood and affect. Her behavior is normal. Judgment and thought content normal.   BP 120/85 mmHg  Pulse 77  Temp(Src) 97.6 F (36.4 C) (Oral)  Ht 5' 1.5" (1.562 m)  Wt 157 lb 9.6 oz (71.487 kg)  BMI 29.30 kg/m2       Assessment & Plan:  1. Insomnia Try the belsomra and see if works better then [employer]lett-[employer]ardambien- if does will write rx. - zolpidem (AMBIEN) 10 MG tablet; Take 1 tablet (10 mg total) by mouth at bedtime as needed. for sleep  Dispense: 30 tablet; Refill: 0 - Suvorexant (BELSOMRA) 15 MG TABS; Take 1 tablet by mouth at bedtime and may repeat dose one time if needed.  Dispense: 10 tablet; Refill: 0  [name] Daphine DeutscherMartin, FNP

## 2015-07-06 NOTE — Patient Instructions (Signed)
Insomnia Insomnia is a sleep disorder that makes it difficult to fall asleep or to stay asleep. Insomnia can cause tiredness (fatigue), low energy, difficulty concentrating, mood swings, and poor performance at work or school.  There are three different ways to classify insomnia:  Difficulty falling asleep.  Difficulty staying asleep.  Waking up too early in the morning. Any type of insomnia can be long-term (chronic) or short-term (acute). Both are common. Short-term insomnia usually lasts for three months or less. Chronic insomnia occurs at least three times a week for longer than three months. CAUSES  Insomnia may be caused by another condition, situation, or substance, such as:  Anxiety.  Certain medicines.  Gastroesophageal reflux disease (GERD) or other gastrointestinal conditions.  Asthma or other breathing conditions.  Restless legs syndrome, sleep apnea, or other sleep disorders.  Chronic pain.  Menopause. This may include hot flashes.  Stroke.  Abuse of alcohol, tobacco, or illegal drugs.  Depression.  Caffeine.   Neurological disorders, such as Alzheimer disease.  An overactive thyroid (hyperthyroidism). The cause of insomnia may not be known. RISK FACTORS Risk factors for insomnia include:  Gender. Women are more commonly affected than men.  Age. Insomnia is more common as you get older.  Stress. This may involve your professional or personal life.  Income. Insomnia is more common in people with lower income.  Lack of exercise.   Irregular work schedule or night shifts.  Traveling between different time zones. SIGNS AND SYMPTOMS If you have insomnia, trouble falling asleep or trouble staying asleep is the main symptom. This may lead to other symptoms, such as:  Feeling fatigued.  Feeling nervous about going to sleep.  Not feeling rested in the morning.  Having trouble concentrating.  Feeling irritable, anxious, or depressed. TREATMENT   Treatment for insomnia depends on the cause. If your insomnia is caused by an underlying condition, treatment will focus on addressing the condition. Treatment may also include:   Medicines to help you sleep.  Counseling or therapy.  Lifestyle adjustments. HOME CARE INSTRUCTIONS   Take medicines only as directed by your health care provider.  Keep regular sleeping and waking hours. Avoid naps.  Keep a sleep diary to help you and your health care provider figure out what could be causing your insomnia. Include:   When you sleep.  When you wake up during the night.  How well you sleep.   How rested you feel the next day.  Any side effects of medicines you are taking.  What you eat and drink.   Make your bedroom a comfortable place where it is easy to fall asleep:  Put up shades or special blackout curtains to block light from outside.  Use a white noise machine to block noise.  Keep the temperature cool.   Exercise regularly as directed by your health care provider. Avoid exercising right before bedtime.  Use relaxation techniques to manage stress. Ask your health care provider to suggest some techniques that may work well for you. These may include:  Breathing exercises.  Routines to release muscle tension.  Visualizing peaceful scenes.  Cut back on alcohol, caffeinated beverages, and cigarettes, especially close to bedtime. These can disrupt your sleep.  Do not overeat or eat spicy foods right before bedtime. This can lead to digestive discomfort that can make it hard for you to sleep.  Limit screen use before bedtime. This includes:  Watching TV.  Using your smartphone, tablet, and computer.  Stick to a routine. This   can help you fall asleep faster. Try to do a quiet activity, brush your teeth, and go to bed at the same time each night.  Get out of bed if you are still awake after 15 minutes of trying to sleep. Keep the lights down, but try reading or  doing a quiet activity. When you feel sleepy, go back to bed.  Make sure that you drive carefully. Avoid driving if you feel very sleepy.  Keep all follow-up appointments as directed by your health care provider. This is important. SEEK MEDICAL CARE IF:   You are tired throughout the day or have trouble in your daily routine due to sleepiness.  You continue to have sleep problems or your sleep problems get worse. SEEK IMMEDIATE MEDICAL CARE IF:   You have serious thoughts about hurting yourself or someone else.   This information is not intended to replace advice given to you by your health care provider. Make sure you discuss any questions you have with your health care provider.   Document Released: 06/20/2000 Document Revised: 03/14/2015 Document Reviewed: 03/24/2014 Elsevier Interactive Patient Education 2016 Elsevier Inc.  

## 2015-07-17 ENCOUNTER — Other Ambulatory Visit: Payer: Self-pay | Admitting: Nurse Practitioner

## 2015-07-17 ENCOUNTER — Telehealth: Payer: Self-pay

## 2015-07-17 DIAGNOSIS — G4700 Insomnia, unspecified: Secondary | ICD-10-CM

## 2015-07-17 MED ORDER — ZOLPIDEM TARTRATE 10 MG PO TABS: 10.0000 mg | ORAL_TABLET | Freq: Every evening | ORAL | Status: DC | PRN

## 2015-07-17 NOTE — Telephone Encounter (Signed)
Should just switch back to [place]- rx ready for pick up

## 2015-07-17 NOTE — Telephone Encounter (Signed)
Patient notified

## 2015-07-17 NOTE — Telephone Encounter (Signed)
Patient states that belsomra is not working at all. She tried for 4 days in a room with no sleep. Had leftover ambien and used those to help to get some sleep. Is this normal for belsomra or should she just switch back? Please advise

## 2015-07-30 ENCOUNTER — Encounter: Payer: Self-pay | Admitting: Pediatrics

## 2015-07-30 ENCOUNTER — Ambulatory Visit (INDEPENDENT_AMBULATORY_CARE_PROVIDER_SITE_OTHER): Payer: BLUE CROSS/BLUE SHIELD | Admitting: Pediatrics

## 2015-07-30 VITALS — BP 125/82 | HR 92 | Temp 97.5°F | Ht <= 58 in | Wt 153.5 lb

## 2015-07-30 DIAGNOSIS — J019 Acute sinusitis, unspecified: Secondary | ICD-10-CM | POA: Diagnosis not present

## 2015-07-30 DIAGNOSIS — J029 Acute pharyngitis, unspecified: Secondary | ICD-10-CM

## 2015-07-30 DIAGNOSIS — J069 Acute upper respiratory infection, unspecified: Secondary | ICD-10-CM | POA: Diagnosis not present

## 2015-07-30 LAB — POCT RAPID STREP A (OFFICE): RAPID STREP A SCREEN: NEGATIVE

## 2015-07-30 MED ORDER — AMOXICILLIN 500 MG PO CAPS: 500.0000 mg | ORAL_CAPSULE | Freq: Two times a day (BID) | ORAL | Status: DC

## 2015-07-30 NOTE — Progress Notes (Signed)
Subjective:    Patient ID: Erica Rodriguez, female    DOB: August 23, 1985, 30 y.o.   MRN: [id number]  CC: Sore Throat and Hot Flashes   HPI: Erica Rodriguez is a 30 y.o. 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 Not sure if she has had any fevers.   Depression screen PHQ 2/9 07/30/2015  Decreased Interest 0  Down, Depressed, Hopeless 0  PHQ - 2 Score 0     Relevant past medical, surgical, family and social history reviewed and updated as indicated. Interim medical history since our last visit reviewed. Allergies and medications reviewed and updated.    ROS: Per HPI unless specifically indicated above  History  Smoking status  . Never Smoker   Smokeless tobacco  . Never Used    Past Medical History Patient Active Problem List   Diagnosis Date Noted  . Insomnia 10/07/2012  . GAD (generalized anxiety disorder) 10/07/2012    Current Outpatient Prescriptions  Medication Sig Dispense Refill  . Diclofenac Potassium (CAMBIA) 50 MG PACK Take 50 mg by mouth as needed (for migraine; max 1 packet per day). 9 each 3  . PREVIDENT 5000 SENSITIVE 1.1-5 % PSTE See admin instructions. Reported on 06/22/2015  5  . topiramate (TOPAMAX) 50 MG tablet Take 1 tablet (50 mg total) by mouth 2 (two) times daily. 60 tablet 12  . tretinoin (RETIN-A) 0.025 % cream Apply bb size amt at bedtime 45 g 0  . zolpidem (AMBIEN) 10 MG tablet Take 1 tablet (10 mg total) by mouth at bedtime as needed. for sleep 30 tablet 2  . amoxicillin (AMOXIL) 500 MG capsule Take 1 capsule (500 mg total) by mouth 2 (two) times daily. 20 capsule 0   No current facility-administered medications for this visit.      Objective:    BP 125/82 mmHg  Pulse 92  Temp(Src) 97.5 F (36.4 C) (Oral)  Ht 3' (0.914 m)  Wt 153 lb 8 oz (69.627 kg)  BMI 83.35 kg/m2  Wt Readings from Last 3 Encounters:  07/30/15 153 lb 8 oz (69.627 kg)  07/06/15 157 lb 9.6 oz (71.487 kg)  06/22/15 158 lb (71.668 kg)    Gen: NAD, alert, cooperative with exam, NCAT, congestion EYES: EOMI, no scleral injection or icterus ENT:  TMs dull gray b/l, OP with erythema, small amount of exudate b/l tonsils LYMPH: no cervical LAD CV: NRRR, normal S1/S2, no murmur, distal pulses 2+ b/l Resp: CTABL, no wheezes, normal WOB Abd: +BS, soft, NTND. no guarding or organomegaly Ext: No edema, warm Neuro: Alert and oriented, strength equal b/l UE and LE, coordination grossly normal MSK: normal muscle bulk     Assessment & Plan:    Erica Rodriguez was seen today for sore throat, likely due to acute URI. Rapid strep negative. Will send for culture. Discussed symptomatic care, if congestion worsening in two days stat antibiotic.   Diagnoses and all orders for this visit:  Sore throat -     POCT rapid strep A -     Culture, Group A Strep  Acute sinusitis, recurrence not specified, unspecified location -     amoxicillin (AMOXIL) 500  MG capsule; Take 1 capsule (500 mg total) by mouth 2 (two) times daily.  Acute URI     Follow up plan: Return if symptoms worsen or fail to improve.  Rex Kras, MD [facility] 07/30/2015, 11:42 AM

## 2015-08-01 LAB — CULTURE, GROUP A STREP: Strep A Culture: NEGATIVE

## 2015-08-07 ENCOUNTER — Ambulatory Visit (INDEPENDENT_AMBULATORY_CARE_PROVIDER_SITE_OTHER): Payer: BLUE CROSS/BLUE SHIELD | Admitting: Diagnostic Neuroimaging

## 2015-08-07 ENCOUNTER — Encounter: Payer: Self-pay | Admitting: Diagnostic Neuroimaging

## 2015-08-07 VITALS — BP 126/82 | HR 88 | Ht 61.5 in | Wt 154.6 lb

## 2015-08-07 DIAGNOSIS — G43109 Migraine with aura, not intractable, without status migrainosus: Secondary | ICD-10-CM | POA: Diagnosis not present

## 2015-08-07 MED ORDER — DICLOFENAC POTASSIUM(MIGRAINE) 50 MG PO PACK: 50.0000 mg | PACK | ORAL | Status: DC | PRN

## 2015-08-07 MED ORDER — TOPIRAMATE 50 MG PO TABS: 50.0000 mg | ORAL_TABLET | Freq: Two times a day (BID) | ORAL | Status: DC

## 2015-08-07 NOTE — Progress Notes (Signed)
[facility]  PATIENT: Erica Rodriguez DOB: 1986-05-28  REFERRING CLINICIAN:  HISTORY FROM: patient  REASON FOR VISIT: follow up   HISTORICAL  CHIEF COMPLAINT:  Chief Complaint  Patient presents with  . Migraine    rm 7, "doing better, when I do have HA, Cambia takes HA away"  . Follow-up    3 month    HISTORY OF PRESENT ILLNESS:   UPDATE 08/07/15: Since last visit, HA are improved. Now having 3-4 HA per month. Cambia helping for rescue medication.   UPDATE 04/04/15: Since last visit, has continued with 6-7 mild HA per month; and occ severe right retroorbital severe HA, with nausea, sens to light; some "messed up" vision and spots before some HA. Dr. Cathey Endow ophthalmology eval'd patient, and no sig papilledema noted. MRI brain and labs unremarkable. Tried imitrex per PCP and had hot feeling, anxiety and flushing, with minimal benefit. Still not sleeping well at night, in spite of nightly ambien.   PRIOR HPI (12/13/14): 30 year old right-handed female here for evaluation of headache, eye pain, swollen optic nerves, retro-orbital pain. One and a half months ago patient noticed right-sided headache, with pressure and pulsating pushing sensation behind her right eye. She had some sensitivity to light, nausea, without phonophobia or vomiting. Initially she thought she was pregnant and took a home pregnancy test which was negative. Symptoms have been constant since that time. She has chronic insomnia and has been on Ambien for several years. She has been on minocycline and trentinon cream for past 6 months. Patient went to optometrist for evaluation was found to have mild optic nerve head edema bilaterally.   REVIEW OF SYSTEMS: Full 14 system review of systems performed and notable only for headache.     ALLERGIES: No Known Allergies  HOME MEDICATIONS: Outpatient Prescriptions Prior to Visit  Medication Sig Dispense Refill  . amoxicillin (AMOXIL) 500 MG capsule Take 1  capsule (500 mg total) by mouth 2 (two) times daily. 20 capsule 0  . Diclofenac Potassium (CAMBIA) 50 MG PACK Take 50 mg by mouth as needed (for migraine; max 1 packet per day). 9 each 3  . PREVIDENT 5000 SENSITIVE 1.1-5 % PSTE See admin instructions. Reported on 06/22/2015  5  . topiramate (TOPAMAX) 50 MG tablet Take 1 tablet (50 mg total) by mouth 2 (two) times daily. 60 tablet 12  . tretinoin (RETIN-A) 0.025 % cream Apply bb size amt at bedtime 45 g 0  . zolpidem (AMBIEN) 10 MG tablet Take 1 tablet (10 mg total) by mouth at bedtime as needed. for sleep 30 tablet 2   No facility-administered medications prior to visit.    PAST MEDICAL HISTORY: Past Medical History  Diagnosis Date  . Depression   . Mild preeclampsia     PAST SURGICAL HISTORY: Past Surgical History  Procedure Laterality Date  . Cesarean section    . Wisdom tooth extraction    . Foot surgery Bilateral     FAMILY HISTORY: Family History  Problem Relation Age of Onset  . Thyroid disease Mother   . Hyperlipidemia Mother   . Hypertension Father   . Kidney disease Father   . Cirrhosis Father   . Diabetes Maternal Grandmother   . Stroke Maternal Grandmother   . Stroke Maternal Grandfather     SOCIAL HISTORY:  Social History   Social History  . Marital Status: Married    Spouse Name: Casimiro Needle  . Number of Children: 1  . Years of Education: 81  Occupational History  .      clincal registration   Social History Main Topics  . Smoking status: Never Smoker   . Smokeless tobacco: Never Used  . Alcohol Use: No  . Drug Use: No  . Sexual Activity: Yes   Other Topics Concern  . Not on file   Social History Narrative   Lives at home with husband and son   Drinks 1 soda a day      PHYSICAL EXAM  Filed Vitals:   08/07/15 1253  BP: 126/82  Pulse: 88  Height: 5' 1.5" (1.562 m)  Weight: 154 lb 9.6 oz (70.126 kg)    Body mass index is 28.74 kg/(m^2).  No exam data present  No flowsheet data  found.  GENERAL EXAM: Patient is in no distress; well developed, nourished and groomed; neck is supple  CARDIOVASCULAR: Regular rate and rhythm, no murmurs, no carotid bruits  NEUROLOGIC: MENTAL STATUS: awake, alert, language fluent, comprehension intact, naming intact, fund of knowledge appropriate CRANIAL NERVE: no papilledema on fundoscopic exam, pupils equal and reactive to light, visual fields full to confrontation, extraocular muscles intact, no nystagmus, facial sensation and strength symmetric, hearing intact, palate elevates symmetrically, uvula midline, shoulder shrug symmetric, tongue midline. MOTOR: normal bulk and tone, full strength in the BUE, BLE SENSORY: normal and symmetric to light touch, temperature, vibration  COORDINATION: finger-nose-finger, fine finger movements normal REFLEXES: deep tendon reflexes present and symmetric GAIT/STATION: narrow based gait; able to walk tandem; romberg is negative    DIAGNOSTIC DATA (LABS, IMAGING, TESTING) - I reviewed patient records, labs, notes, testing and imaging myself where available.  Lab Results  Component Value Date   WBC 5.3 12/13/2014   HCT 41.7 12/13/2014   MCV 97 12/13/2014   PLT 361 12/13/2014      Component Value Date/Time   NA 140 12/13/2014 0939   K 4.4 12/13/2014 0939   CL 101 12/13/2014 0939   CO2 25 12/13/2014 0939   GLUCOSE 90 12/13/2014 0939   BUN 11 12/13/2014 0939   CREATININE 0.63 12/13/2014 0939   CALCIUM 9.7 12/13/2014 0939   PROT 7.3 12/13/2014 0939   ALBUMIN 4.4 12/13/2014 0939   AST 14 12/13/2014 0939   ALT 12 12/13/2014 0939   ALKPHOS 102 12/13/2014 0939   BILITOT 0.4 12/13/2014 0939   GFRNONAA 122 12/13/2014 0939   GFRAA 140 12/13/2014 0939   No results found for: CHOL, HDL, LDLCALC, LDLDIRECT, TRIG, CHOLHDL Lab Results  Component Value Date   HGBA1C 5.7* 12/13/2014   Lab Results  Component Value Date   VITAMINB12 269 12/13/2014   Lab Results  Component Value Date   TSH  2.970 12/13/2014    12/13/14 MRI brain [I reviewed images myself and agree with interpretation. -VRP]  - normal    ASSESSMENT AND PLAN  30 y.o. year old female here with new onset headache, blurred vision, possible optic nerve edema, since April 2016. MRI, labs, and eye exam are unremarkable. Likely represents migraine with aura.    Dx:   Migraine with aura and without status migrainosus, not intractable     PLAN: - continue topiramate  BID for migraine prevention; may try increasing up to 50 / 100  - continue cambia prn HA  Meds ordered this encounter  Medications  . topiramate (TOPAMAX) 50 MG tablet    Sig: Take 1 tablet (50 mg total) by mouth 2 (two) times daily.    Dispense:  180 tablet    Refill:  4  . Diclofenac Potassium (CAMBIA) 50 MG PACK    Sig: Take 50 mg by mouth as needed (for migraine; max 1 packet per day).    Dispense:  9 each    Refill:  12   Return in about 6 months (around 02/04/2016).    Suanne Marker, MD 08/07/2015, 12:58 PM Certified in Neurology, Neurophysiology and Neuroimaging  [facility] [address], [address] [place], [place] [address] [phone]

## 2015-08-07 NOTE — Patient Instructions (Signed)
Thank you for coming to see [place] at [facility]. I hope we have been able to provide you high quality care today.  You may receive a patient satisfaction survey over the next few weeks. We would appreciate your feedback and comments so that we may continue to improve ourselves and the health of our patients.  - continue topiramate; may increase up to 170m twice a day - continue cambia as needed    ~~~~~~~~~~~~~~~~~~~~~~~~~~~~~~~~~~~~~~~~~~~~~~~~~~~~~~~~~~~~~~~~~  DR. PENUMALLI'S GUIDE TO HAPPY AND HEALTHY LIVING These are some of my general health and wellness recommendations. Some of them may apply to you better than others. Please use common sense as you try these suggestions and feel free to ask me any questions.   ACTIVITY/FITNESS Mental, social, emotional and physical stimulation are very important for brain and body health. Try learning a new activity (arts, music, language, sports, games).  Keep moving your body to the best of your abilities. You can do this at home, inside or outside, the park, community center, gym or anywhere you like. Consider a physical therapist or personal trainer to get started. Consider the app Sworkit. Fitness trackers such as smart-watches, smart-phones or Fitbits can help as well.   NUTRITION Eat more plants: colorful vegetables, nuts, seeds and berries.  Eat less sugar, salt, preservatives and processed foods.  Avoid toxins such as cigarettes and alcohol.  Drink water when you are thirsty. Warm water with a slice of lemon is an excellent morning drink to start the day.  Consider these websites for more information The Nutrition Source (hhttps://www.henry-hernandez.biz/ [employer] Nutrition (wWindowBlog.ch   RELAXATION Consider practicing mindfulness meditation or other relaxation techniques such as deep breathing, prayer, yoga, tai chi, massage. See website mindful.org or the apps  Headspace or Calm to help get started.   SLEEP Try to get at least 7-8+ hours sleep per day. Regular exercise and reduced caffeine will help you sleep better. Practice good sleep hygeine techniques. See website sleep.org for more information.   PLANNING Prepare estate planning, living will, healthcare POA documents. Sometimes this is best planned with the help of an attorney. Theconversationproject.org and agingwithdignity.org are excellent resources.

## 2015-09-10 ENCOUNTER — Encounter: Payer: Self-pay | Admitting: Diagnostic Neuroimaging

## 2015-09-24 ENCOUNTER — Ambulatory Visit (INDEPENDENT_AMBULATORY_CARE_PROVIDER_SITE_OTHER): Payer: BLUE CROSS/BLUE SHIELD | Admitting: Family Medicine

## 2015-09-24 ENCOUNTER — Encounter: Payer: Self-pay | Admitting: Family Medicine

## 2015-09-24 VITALS — BP 112/70 | HR 64 | Temp 97.8°F

## 2015-09-24 DIAGNOSIS — J069 Acute upper respiratory infection, unspecified: Secondary | ICD-10-CM | POA: Diagnosis not present

## 2015-09-24 LAB — POCT RAPID STREP A (OFFICE): RAPID STREP A SCREEN: NEGATIVE

## 2015-09-24 NOTE — Patient Instructions (Signed)
Let's treat your symptoms and see if you start to improve. Take the Norel-AD or a decongestant and see if your congestion improves. Use the saline nasal spray as needed.  Your strep test was negative.  If you get worse we will need to send in antibiotic prescription.

## 2015-09-24 NOTE — Progress Notes (Signed)
Subjective:  Erica Rodriguez is a 30 y.o. female who presents for possible sinus infection.  Symptoms include URI symptoms that started 3 days ago with nasal congestion, bilateral ear pain, left worse than right, ear pain worse with blowing her nose. Reports clear drainage with some blood.  Denies fever, chills, bodyaches, cough. Denies fever, chills, body aches, cough.   Past history is significant for no history of pneumonia or bronchitis. Patient is a non-smoker.  Using neti pot for symptoms.  positive sick contacts.  No other aggravating or relieving factors.  No other c/o.  ROS as in subjective   Objective: Filed Vitals:   09/24/15 1131  BP: 112/70  Pulse: 64  Temp: 97.8 F (36.6 C)    General appearance: Alert, WD/WN, no distress                             Skin: warm, no rash                           Head: + mild maxillary sinus tenderness,                            Eyes: conjunctiva normal, corneas clear, PERRLA                            Ears: pearly TMs, external ear canals normal                          Nose: septum midline, turbinates swollen, with erythema and clear discharge             Mouth/throat: MMM, tongue normal, moderate pharyngeal erythema                           Neck: supple, mild left anterior cervical adenopathy, no thyromegaly, nontender                          Heart: RRR, normal S1, S2, no murmurs                         Lungs: CTA bilaterally, no wheezes, rales, or rhonchi      Assessment and Plan: Acute URI - Plan: POCT rapid strep A  Rapid strep negative  Discussed that her symptoms are related to viral vs bacterial etiology at this point. Norel AD sample given and saline nasal spray.  Can use OTC Mucinex for congestion.  Tylenol or Ibuprofen OTC for fever and malaise.  Discussed symptomatic relief, nasal saline flush, and call or return if worse or not improving in 2-3 days.

## 2015-09-26 ENCOUNTER — Other Ambulatory Visit: Payer: Self-pay | Admitting: Family Medicine

## 2015-09-26 DIAGNOSIS — J019 Acute sinusitis, unspecified: Secondary | ICD-10-CM

## 2015-09-26 MED ORDER — AMOXICILLIN 875 MG PO TABS: 875.0000 mg | ORAL_TABLET | Freq: Two times a day (BID) | ORAL | Status: DC

## 2015-09-26 NOTE — Progress Notes (Signed)
Patient with persistent nasal congestion, now has purulent drainage and worsening left ear pain. Amoxil sent to pharmacy

## 2015-10-11 ENCOUNTER — Other Ambulatory Visit: Payer: Self-pay

## 2015-10-11 DIAGNOSIS — G4700 Insomnia, unspecified: Secondary | ICD-10-CM

## 2015-10-11 MED ORDER — ZOLPIDEM TARTRATE 10 MG PO TABS: 10.0000 mg | ORAL_TABLET | Freq: Every evening | ORAL | Status: DC | PRN

## 2015-10-11 MED ORDER — TRETINOIN 0.025 % EX CREA: TOPICAL_CREAM | CUTANEOUS | Status: DC

## 2015-10-11 NOTE — Telephone Encounter (Signed)
Patient last seen in office on 1-23. Please advise on refills

## 2015-10-11 NOTE — Telephone Encounter (Signed)
Please call in ambien with 1 refills 

## 2015-10-12 ENCOUNTER — Encounter: Payer: Self-pay | Admitting: Diagnostic Neuroimaging

## 2015-10-12 NOTE — Telephone Encounter (Signed)
I called patient. She has been having more HA, but had stomach bug last week and ate some cheese. No stress. Cannot tolerate TPX 50/100 due to acne reaction. Offered pt to come in on Monday for office visit or migraine infusion. She will rest over the weekend and send us message if she wants to come in. -VRP

## 2015-10-13 ENCOUNTER — Ambulatory Visit (INDEPENDENT_AMBULATORY_CARE_PROVIDER_SITE_OTHER): Payer: BLUE CROSS/BLUE SHIELD | Admitting: Family Medicine

## 2015-10-13 VITALS — BP 110/75 | HR 93 | Temp 97.4°F | Ht 61.5 in | Wt 149.2 lb

## 2015-10-13 DIAGNOSIS — G43819 Other migraine, intractable, without status migrainosus: Secondary | ICD-10-CM

## 2015-10-13 DIAGNOSIS — M26629 Arthralgia of temporomandibular joint, unspecified side: Secondary | ICD-10-CM

## 2015-10-13 NOTE — Patient Instructions (Signed)
Great to see you!  It looks Scientist, physiologicalliek yor ear infection has resolved  For your headache I have given you toradol (an NSAID like advil) and depo medrol (a steroid), The steroid will likely help your ear and jaw pain too.   Try nasocort or flonase 2 sprays each nostril for your sinuses

## 2015-10-13 NOTE — Progress Notes (Signed)
   HPI  Patient presents today here for ear pain.  Patient explains that over the last 2-3 weeks she's had ear pain. It started after she used a new nasal saline rinse bottle, she usually has the passive boring type, this type was a pressure squeezing type She had immediate ear pain after using the saline wash. It went away but then lingered for several days.  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 She just finished the amoxicillin and continues to have pain.  She also has pain with chewing on the left anterior ear area, the pain radiates down to her left neck.  She denies cough, shortness of breath, malaise, or other concerns of infection. She did have congestion prior to the amoxicillin and feels better.  She's also had a bitemporal headache consistent with her previous migraines that has been persistent and lingering for about 2 weeks. She called her neurologist who stated she needed to be seen today as they were closing by the time she called.  PMH: Smoking status noted ROS: Per HPI  Objective: BP 110/75 mmHg  Pulse 93  Temp(Src) 97.4 F (36.3 C) (Oral)  Ht 5' 1.5" (1.562 m)  Wt 149 lb 4 oz (67.699 kg)  BMI 27.75 kg/m2 Gen: NAD, alert, cooperative with exam HEENT: NCAT, bilateral TMs normal, pain with palpation of the left TMJ with opening CV: RRR, good S1/S2, no murmur Resp: CTABL, no wheezes, non-labored Ext: No edema, warm Neuro: Alert and oriented, No gross deficits  Assessment and plan:  # Migraine headache IM Toradol and Depo-Medrol given today Discussed other headache prophylaxis, she has medications to try at home which are usually effective. Pain consistent with previous migraines  # Left ear pain, TMJ pain Ear infection resolved after course of amoxicillin Pain with palpation of the left TMJ mouth opening. I believe the Toradol and Depo-Medrol will be very effective in  reducing inflammation at the area    Murtis SinkSam Colter Magowan, MD [facility] 10/13/2015, 9:11 AM

## 2015-10-16 MED ORDER — KETOROLAC TROMETHAMINE 60 MG/2ML IM SOLN: 60.0000 mg | Freq: Once | INTRAMUSCULAR | Status: DC

## 2015-10-16 NOTE — Addendum Note (Signed)
Addended by: Margurite AuerbachOMPTON, KARLA G on: 10/16/2015 04:28 PM   Modules accepted: Orders

## 2015-10-18 ENCOUNTER — Encounter: Payer: Self-pay | Admitting: Family Medicine

## 2016-01-02 ENCOUNTER — Encounter: Payer: Self-pay | Admitting: Family Medicine

## 2016-01-02 ENCOUNTER — Ambulatory Visit (INDEPENDENT_AMBULATORY_CARE_PROVIDER_SITE_OTHER): Payer: BLUE CROSS/BLUE SHIELD | Admitting: Family Medicine

## 2016-01-02 VITALS — BP 120/70 | HR 64 | Temp 98.3°F

## 2016-01-02 DIAGNOSIS — M26609 Unspecified temporomandibular joint disorder, unspecified side: Secondary | ICD-10-CM | POA: Diagnosis not present

## 2016-01-02 NOTE — Patient Instructions (Signed)
Take 2 Aleve twice daily with food for the next 3-4 days. You can use warm or cool compresses to the area. Eat soft foods and avoid chewing gum or grinding your teeth.  Scheduled appointment with your dentist to discuss possible mouth guard.   Temporomandibular Joint Syndrome Temporomandibular joint (TMJ) syndrome is a condition that affects the joints between your jaw and your skull. The TMJs are located near your ears and allow your jaw to open and close. These joints and the nearby muscles are involved in all movements of the jaw. People with TMJ syndrome have pain in the area of these joints and muscles. Chewing, biting, or other movements of the jaw can be difficult or painful. TMJ syndrome can be caused by various things. In many cases, the condition is mild and goes away within a few weeks. For some people, the condition can become a long-term problem. CAUSES Possible causes of TMJ syndrome include:  Grinding your teeth or clenching your jaw. Some people do this when they are under stress.  Arthritis.  Injury to the jaw.  Head or neck injury.  Teeth or dentures that are not aligned well. In some cases, the cause of TMJ syndrome may not be known. SIGNS AND SYMPTOMS The most common symptom is an aching pain on the side of the head in the area of the TMJ. Other symptoms may include:  Pain when moving your jaw, such as when chewing or biting.  Being unable to open your jaw all the way.  Making a clicking sound when you open your mouth.  Headache.  Earache.  Neck or shoulder pain. DIAGNOSIS Diagnosis can usually be made based on your symptoms, your medical history, and a physical exam. Your health care provider may check the range of motion of your jaw. Imaging tests, such as X-rays or an MRI, are sometimes done. You may need to see your dentist to determine if your teeth and jaw are lined up correctly. TREATMENT TMJ syndrome often goes away on its own. If treatment is needed,  the options may include:  Eating soft foods and applying ice or heat.  Medicines to relieve pain or inflammation.  Medicines to relax the muscles.  A splint, bite plate, or mouthpiece to prevent teeth grinding or jaw clenching.  Relaxation techniques or counseling to help reduce stress.  Transcutaneous electrical nerve stimulation (TENS). This helps to relieve pain by applying an electrical current through the skin.  Acupuncture. This is sometimes helpful to relieve pain.  Jaw surgery. This is rarely needed. HOME CARE INSTRUCTIONS  Take medicines only as directed by your health care provider.  Eat a soft diet if you are having trouble chewing.  Apply ice to the painful area.  Put ice in a plastic bag.  Place a towel between your skin and the bag.  Leave the ice on for 20 minutes, 2-3 times a day.  Apply a warm compress to the painful area as directed.  Massage your jaw area and perform any jaw stretching exercises as recommended by your health care provider.  If you were given a mouthpiece or bite plate, wear it as directed.  Avoid foods that require a lot of chewing. Do not chew gum.  Keep all follow-up visits as directed by your health care provider. This is important. SEEK MEDICAL CARE IF:  You are having trouble eating.  You have new or worsening symptoms. SEEK IMMEDIATE MEDICAL CARE IF:  Your jaw locks open or closed.   This  information is not intended to replace advice given to you by your health care provider. Make sure you discuss any questions you have with your health care provider.   Document Released: 03/18/2001 Document Revised: 07/14/2014 Document Reviewed: 01/26/2014 Elsevier Interactive Patient Education [employer].

## 2016-01-02 NOTE — Progress Notes (Signed)
   Subjective:    Patient ID: Erica Rodriguez, female    DOB: [birth year], 29 y.o.   MRN: [id number]  HPI Chief Complaint  Patient presents with  . jaw pain    jaw pain and popping- certain things make it hurt.   She is here with complaints of a 2 week history of bilateral jaw pain. Has noticed some occasionally popping. States pain is worse on the right side. Pain is worse with opening her mouth wide and eating. She states her husband recently told her she has been grinding her teeth at night. Reports history of TMJ.   Has been taking Tylenol 2 per day with minimal relief.   Denies fever, chills, sinus congestion, ear pain, sore throat, nausea, vomiting.  Reviewed allergies, medications, past medical history.   Review of Systems Pertinent positives and negatives in the history of present illness.     Objective:   Physical Exam  Constitutional: She appears well-developed and well-nourished. No distress.  HENT:  Right Ear: Tympanic membrane and ear canal normal.  Left Ear: Tympanic membrane and ear canal normal.  Nose: Nose normal. Right sinus exhibits no maxillary sinus tenderness and no frontal sinus tenderness. Left sinus exhibits no maxillary sinus tenderness and no frontal sinus tenderness.  Mouth/Throat: Uvula is midline, oropharynx is clear and moist and mucous membranes are normal. Normal dentition.  Popping noted to TMJ bilaterally with tenderness to palpation right greater than left.   Neck: Normal range of motion. Neck supple.  Lymphadenopathy:       Head (right side): No preauricular and no posterior auricular adenopathy present.       Head (left side): No preauricular and no posterior auricular adenopathy present.    She has no cervical adenopathy.   BP 120/70 mmHg  Pulse 64  Temp(Src) 98.3 F (36.8 C) (Oral)      Assessment & Plan:  Temporal mandibular joint disorder  Discussed that she appears to have TMJ and recommend nonpharmacological treatment such as  avoiding gum chewing and eating soft foods for the next week. She may take 2 Aleve twice daily with food short term. Recommend that she schedule an appointment with her dentist for further evaluation and possible mouthguard at bedtime. Follow up if no improvement in 2 weeks or sooner if she worsens.

## 2016-01-03 ENCOUNTER — Other Ambulatory Visit: Payer: Self-pay | Admitting: Nurse Practitioner

## 2016-01-03 ENCOUNTER — Encounter: Payer: Self-pay | Admitting: Nurse Practitioner

## 2016-01-03 DIAGNOSIS — G4700 Insomnia, unspecified: Secondary | ICD-10-CM

## 2016-01-03 MED ORDER — ZOLPIDEM TARTRATE 10 MG PO TABS: 10.0000 mg | ORAL_TABLET | Freq: Every evening | ORAL | Status: DC | PRN

## 2016-01-03 NOTE — Progress Notes (Signed)
rx called into pharmacy

## 2016-01-04 ENCOUNTER — Ambulatory Visit (INDEPENDENT_AMBULATORY_CARE_PROVIDER_SITE_OTHER): Payer: BLUE CROSS/BLUE SHIELD | Admitting: Family Medicine

## 2016-01-04 ENCOUNTER — Encounter: Payer: Self-pay | Admitting: Family Medicine

## 2016-01-04 VITALS — BP 126/84 | HR 85 | Temp 98.1°F | Ht 61.5 in | Wt 148.2 lb

## 2016-01-04 DIAGNOSIS — M26609 Unspecified temporomandibular joint disorder, unspecified side: Secondary | ICD-10-CM

## 2016-01-04 DIAGNOSIS — G43819 Other migraine, intractable, without status migrainosus: Secondary | ICD-10-CM

## 2016-01-04 MED ORDER — KETOROLAC TROMETHAMINE 30 MG/ML IJ SOLN
30.0000 mg | Freq: Once | INTRAMUSCULAR | Status: AC
  Administered 2016-01-04: 30 mg via INTRAMUSCULAR

## 2016-01-04 MED ORDER — METHYLPREDNISOLONE ACETATE 80 MG/ML IJ SUSP
80.0000 mg | Freq: Once | INTRAMUSCULAR | Status: AC
  Administered 2016-01-04: 80 mg via INTRAMUSCULAR

## 2016-01-04 NOTE — Progress Notes (Signed)
BP 126/84 mmHg  Pulse 85  Temp(Src) 98.1 F (36.7 C) (Oral)  Ht 5' 1.5" (1.562 m)  Wt 148 lb 3.2 oz (67.223 kg)  BMI 27.55 kg/m2   Subjective:    Patient ID: Erica Rodriguez, female    DOB: 25-Aug-1985, 29 y.o.   MRN: [id number]  HPI: Erica Rodriguez is a 30 y.o. female presenting on 01/04/2016 for Headache   HPI Headache and jaw pain Patient has migraines and this one is right sided and behind her eye and temporal region on the right side which is where she usually has them. She also has jaw pain mostly on that right side more than left which is similar to her TMJ which she has had flareup before. She does not get this to often but is on prophylactic medications for migraines to help with this. She denies any fevers or chills or cough or sore throat or nasal congestion or postnasal drainage. She denies any sinus pressure.  Relevant past medical, surgical, family and social history reviewed and updated as indicated. Interim medical history since our last visit reviewed. Allergies and medications reviewed and updated.  Review of Systems  Constitutional: Negative for fever and chills.  HENT: Negative for congestion, ear discharge, ear pain, mouth sores, sore throat, tinnitus and voice change.   Eyes: Negative for redness and visual disturbance.  Respiratory: Negative for cough, chest tightness and shortness of breath.   Cardiovascular: Negative for chest pain and leg swelling.  Genitourinary: Negative for dysuria and difficulty urinating.  Musculoskeletal: Negative for back pain and gait problem.  Skin: Negative for rash.  Neurological: Positive for headaches. Negative for light-headedness.  Psychiatric/Behavioral: Negative for behavioral problems and agitation.  All other systems reviewed and are negative.   Per HPI unless specifically indicated above     Medication List       This list is accurate as of: 01/04/16 10:26 AM.  Always use your most recent med list.                 Diclofenac Potassium 50 MG Pack  Commonly known as:  CAMBIA  Take 50 mg by mouth as needed (for migraine; max 1 packet per day).     PREVIDENT 5000 SENSITIVE 1.1-5 % Pste  Generic drug:  Sod Fluoride-Potassium Nitrate  See admin instructions. Reported on 06/22/2015     topiramate 50 MG tablet  Commonly known as:  TOPAMAX  Take 1 tablet (50 mg total) by mouth 2 (two) times daily.     tretinoin 0.025 % cream  Commonly known as:  RETIN-A  Apply bb size amt at bedtime     zolpidem 10 MG tablet  Commonly known as:  AMBIEN  Take 1 tablet (10 mg total) by mouth at bedtime as needed. for sleep           Objective:    BP 126/84 mmHg  Pulse 85  Temp(Src) 98.1 F (36.7 C) (Oral)  Ht 5' 1.5" (1.562 m)  Wt 148 lb 3.2 oz (67.223 kg)  BMI 27.55 kg/m2  Wt Readings from Last 3 Encounters:  01/04/16 148 lb 3.2 oz (67.223 kg)  10/13/15 149 lb 4 oz (67.699 kg)  08/07/15 154 lb 9.6 oz (70.126 kg)    Physical Exam  Constitutional: She is oriented to person, place, and time. She appears well-developed and well-nourished. No distress.  Eyes: Conjunctivae and EOM are normal. Pupils are equal, round, and reactive to light.  Neck: Neck supple. No thyromegaly  present.  Cardiovascular: Normal rate, regular rhythm, normal heart sounds and intact distal pulses.   No murmur heard. Pulmonary/Chest: Effort normal and breath sounds normal. No respiratory distress. She has no wheezes.  Musculoskeletal: Normal range of motion. She exhibits tenderness (Right-sided TMJ tenderness). She exhibits no edema.  Lymphadenopathy:    She has no cervical adenopathy.  Neurological: She is alert and oriented to person, place, and time. She displays normal reflexes. No cranial nerve deficit. She exhibits normal muscle tone. Coordination normal.  Skin: Skin is warm and dry. No rash noted. She is not diaphoretic.  Psychiatric: She has a normal mood and affect. Her behavior is normal.  Nursing note and vitals  reviewed.     Assessment & Plan:   Problem List Items Addressed This Visit    None    Visit Diagnoses    Temporal mandibular joint disorder    -  Primary    Relevant Medications    ketorolac (TORADOL) 30 MG/ML injection 30 mg (Start on 01/04/2016 10:30 AM)    methylPREDNISolone acetate (DEPO-MEDROL) injection 80 mg (Start on 01/04/2016 10:30 AM)    Other migraine without status migrainosus, intractable        Relevant Medications    ketorolac (TORADOL) 30 MG/ML injection 30 mg (Start on 01/04/2016 10:30 AM)    methylPREDNISolone acetate (DEPO-MEDROL) injection 80 mg (Start on 01/04/2016 10:30 AM)        Follow up plan: Return if symptoms worsen or fail to improve.  Counseling provided for all of the vaccine components No orders of the defined types were placed in this encounter.    Arville CareJoshua Dettinger, MD [facility] 01/04/2016, 10:26 AM

## 2016-01-10 ENCOUNTER — Encounter: Payer: Self-pay | Admitting: Family Medicine

## 2016-01-26 ENCOUNTER — Encounter: Payer: Self-pay | Admitting: Family

## 2016-01-26 ENCOUNTER — Ambulatory Visit (INDEPENDENT_AMBULATORY_CARE_PROVIDER_SITE_OTHER): Payer: BLUE CROSS/BLUE SHIELD | Admitting: Family

## 2016-01-26 VITALS — BP 124/95 | Temp 98.0°F | Ht 61.5 in | Wt 146.8 lb

## 2016-01-26 DIAGNOSIS — S0300XS Dislocation of jaw, unspecified side, sequela: Secondary | ICD-10-CM

## 2016-01-26 DIAGNOSIS — R519 Headache, unspecified: Secondary | ICD-10-CM

## 2016-01-26 DIAGNOSIS — J309 Allergic rhinitis, unspecified: Secondary | ICD-10-CM

## 2016-01-26 DIAGNOSIS — R51 Headache: Secondary | ICD-10-CM

## 2016-01-26 DIAGNOSIS — M26601 Right temporomandibular joint disorder, unspecified: Secondary | ICD-10-CM

## 2016-01-26 MED ORDER — FLUTICASONE PROPIONATE 50 MCG/ACT NA SUSP: 2.0000 | Freq: Every day | NASAL | Status: DC

## 2016-01-26 MED ORDER — CYCLOBENZAPRINE HCL 5 MG PO TABS: 5.0000 mg | ORAL_TABLET | Freq: Three times a day (TID) | ORAL | Status: DC | PRN

## 2016-01-26 NOTE — Patient Instructions (Signed)

## 2016-01-26 NOTE — Progress Notes (Signed)
Subjective:    Patient ID: Erica Rodriguez, female    DOB: 04-28-1986, 30 y.o.   MRN: [id number]  HPI PT presents to the office today with right jaw pain that started about two months ago. PT had an ear infection and was treated with antibiotics. Then was seen in the office on 06/30/17and was diagnosed with TMJ and given toradol injection with moderate relief. PT states after two days her symptoms returns. PT took motrin with no relief. PT went to the Urgent Care on 01/21/16 and was given flexeril, Toradol and steroid injection, and NSAID. PT states she has taken flexeril every night and also took steroid dose pack. PT states this helped her headache for a few days, but her jaw pain continued. PT states she is having constant pain in her jaw and headache at 8 out 10. PT talks on the phone all day at work, but denies any grinding teeth.    Review of Systems  Constitutional: Negative.   HENT: Negative.   Eyes: Negative.   Respiratory: Negative.  Negative for shortness of breath.   Cardiovascular: Negative.  Negative for palpitations.  Gastrointestinal: Negative.   Endocrine: Negative.   Genitourinary: Negative.   Musculoskeletal: Positive for joint swelling.  Neurological: Negative.  Negative for headaches.  Hematological: Negative.   Psychiatric/Behavioral: Negative.   All other systems reviewed and are negative.      Objective:   Physical Exam  Constitutional: She is oriented to person, place, and time. She appears well-developed and well-nourished. No distress.  HENT:  Head: Normocephalic and atraumatic.  Right Ear: External ear normal.  Left Ear: External ear normal.  Nasal passage erythemas with mild swelling    Eyes: Pupils are equal, round, and reactive to light.  Neck: Normal range of motion. Neck supple. No thyromegaly present.  Cardiovascular: Normal rate, regular rhythm, normal heart sounds and intact distal pulses.   No murmur heard. Pulmonary/Chest: Effort normal and  breath sounds normal. No respiratory distress. She has no wheezes.  Abdominal: Soft. Bowel sounds are normal. She exhibits no distension. There is no tenderness.  Musculoskeletal: Normal range of motion. She exhibits tenderness (right jaw tenderness with opening and closing). She exhibits no edema.  Neurological: She is alert and oriented to person, place, and time. She has normal reflexes. No cranial nerve deficit.  Skin: Skin is warm and dry.  Psychiatric: She has a normal mood and affect. Her behavior is normal. Judgment and thought content normal.  Vitals reviewed.     BP 124/95 mmHg  Temp(Src) 98 F (36.7 C) (Oral)  Ht 5' 1.5" (1.562 m)  Wt 146 lb 12.8 oz (66.588 kg)  BMI 27.29 kg/m2     Assessment & Plan:  1. TMJ (dislocation of temporomandibular joint), sequela - Ambulatory referral to ENT - cyclobenzaprine (FLEXERIL) 5 MG tablet; Take 1 tablet (5 mg total) by mouth 3 (three) times daily as needed for muscle spasms.  Dispense: 60 tablet; Refill: 1  2. Nonintractable headache, unspecified chronicity pattern, unspecified headache type - Ambulatory referral to ENT - cyclobenzaprine (FLEXERIL) 5 MG tablet; Take 1 tablet (5 mg total) by mouth 3 (three) times daily as needed for muscle spasms.  Dispense: 60 tablet; Refill: 1  3. Allergic rhinitis, unspecified allergic rhinitis type - fluticasone (FLONASE) 50 MCG/ACT nasal spray; Place 2 sprays into both nostrils daily.  Dispense: 16 g; Refill: 6   Will refer to ENT at this point, Pt has been treated with Toradol, steroids, flexeril, antibiotics, and  motrin with no relief.  Avoid foods that require a lot of chewing Continue NSAID's Ice RTO prn   Jannifer Rodney, FNP

## 2016-02-05 ENCOUNTER — Ambulatory Visit (INDEPENDENT_AMBULATORY_CARE_PROVIDER_SITE_OTHER): Payer: BLUE CROSS/BLUE SHIELD | Admitting: Diagnostic Neuroimaging

## 2016-02-05 ENCOUNTER — Encounter: Payer: Self-pay | Admitting: Diagnostic Neuroimaging

## 2016-02-05 VITALS — BP 125/81 | HR 102

## 2016-02-05 DIAGNOSIS — G43109 Migraine with aura, not intractable, without status migrainosus: Secondary | ICD-10-CM | POA: Diagnosis not present

## 2016-02-05 DIAGNOSIS — M26621 Arthralgia of right temporomandibular joint: Secondary | ICD-10-CM | POA: Diagnosis not present

## 2016-02-05 DIAGNOSIS — G4700 Insomnia, unspecified: Secondary | ICD-10-CM | POA: Diagnosis not present

## 2016-02-05 MED ORDER — TOPIRAMATE 50 MG PO TABS: 50.0000 mg | ORAL_TABLET | Freq: Two times a day (BID) | ORAL | 4 refills | Status: DC

## 2016-02-05 MED ORDER — DICLOFENAC POTASSIUM(MIGRAINE) 50 MG PO PACK: 50.0000 mg | PACK | ORAL | 12 refills | Status: DC | PRN

## 2016-02-05 NOTE — Progress Notes (Signed)
[facility]  PATIENT: Erica Rodriguez DOB: [birth year]  REFERRING CLINICIAN:  HISTORY FROM: patient  REASON FOR VISIT: follow up   HISTORICAL  CHIEF COMPLAINT:  Chief Complaint  Patient presents with  . Migraine    rm 6, "? TMJ , treated with Toradol, Pred dose pack, Flexeril- caused migraine; Tylenol daily helps"  . Follow-up    6 month    HISTORY OF PRESENT ILLNESS:   UPDATE 02/05/16: Since last visit having 2-3 migraine per month. Some mild hair loss. Also with right TMJ pain in April 2017, then June 2017. Had tx with PCP (toradol, prednisone, flexeril), and now improved.   UPDATE 08/07/15: Since last visit, HA are improved. Now having 3-4 HA per month. Cambia helping for rescue medication.   UPDATE 04/04/15: Since last visit, has continued with 6-7 mild HA per month; and occ severe right retroorbital severe HA, with nausea, sens to light; some "messed up" vision and spots before some HA. Dr. Cathey Endow ophthalmology eval'd patient, and no sig papilledema noted. MRI brain and labs unremarkable. Tried imitrex per PCP and had hot feeling, anxiety and flushing, with minimal benefit. Still not sleeping well at night, in spite of nightly ambien.   PRIOR HPI (12/13/14): 30 year old right-handed female here for evaluation of headache, eye pain, swollen optic nerves, retro-orbital pain. One and a half months ago patient noticed right-sided headache, with pressure and pulsating pushing sensation behind her right eye. She had some sensitivity to light, nausea, without phonophobia or vomiting. Initially she thought she was pregnant and took a home pregnancy test which was negative. Symptoms have been constant since that time. She has chronic insomnia and has been on Ambien for several years. She has been on minocycline and trentinon cream for past 6 months. Patient went to optometrist for evaluation was found to have mild optic nerve head edema bilaterally.   REVIEW OF SYSTEMS: Full 14  system review of systems performed and notable only for headache insomnia.    ALLERGIES: No Known Allergies  HOME MEDICATIONS: Outpatient Medications Prior to Visit  Medication Sig Dispense Refill  . cyclobenzaprine (FLEXERIL) 5 MG tablet Take 1 tablet (5 mg total) by mouth 3 (three) times daily as needed for muscle spasms. 60 tablet 1  . Diclofenac Potassium (CAMBIA) 50 MG PACK Take 50 mg by mouth as needed (for migraine; max 1 packet per day). 9 each 12  . fluticasone (FLONASE) 50 MCG/ACT nasal spray Place 2 sprays into both nostrils daily. 16 g 6  . PREVIDENT 5000 SENSITIVE 1.1-5 % PSTE See admin instructions. Reported on 06/22/2015  5  . topiramate (TOPAMAX) 50 MG tablet Take 1 tablet (50 mg total) by mouth 2 (two) times daily. 180 tablet 4  . tretinoin (RETIN-A) 0.025 % cream Apply bb size amt at bedtime 45 g 0  . zolpidem (AMBIEN) 10 MG tablet Take 1 tablet (10 mg total) by mouth at bedtime as needed. for sleep 30 tablet 1   No facility-administered medications prior to visit.     PAST MEDICAL HISTORY: Past Medical History:  Diagnosis Date  . Depression   . Mild preeclampsia     PAST SURGICAL HISTORY: Past Surgical History:  Procedure Laterality Date  . CESAREAN SECTION    . FOOT SURGERY Bilateral   . WISDOM TOOTH EXTRACTION      FAMILY HISTORY: Family History  Problem Relation Age of Onset  . Thyroid disease Mother   . Hyperlipidemia Mother   . Hypertension Father   .  Kidney disease Father   . Cirrhosis Father   . Diabetes Maternal Grandmother   . Stroke Maternal Grandmother   . Stroke Maternal Grandfather     SOCIAL HISTORY:  Social History   Social History  . Marital status: Married    Spouse name: Casimiro Needle  . Number of children: 1  . Years of education: 52   Occupational History  .      clincal registration   Social History Main Topics  . Smoking status: Never Smoker  . Smokeless tobacco: Never Used  . Alcohol use No  . Drug use: No  .  Sexual activity: Yes   Other Topics Concern  . Not on file   Social History Narrative   Lives at home with husband and son   Drinks 1 soda a day      PHYSICAL EXAM  Vitals:   02/05/16 1439  BP: 125/81  Pulse: (!) 102   Wt Readings from Last 3 Encounters:  01/26/16 146 lb 12.8 oz (66.6 kg)  01/04/16 148 lb 3.2 oz (67.2 kg)  10/13/15 149 lb 4 oz (67.7 kg)   There is no height or weight on file to calculate BMI.  No exam data present  No flowsheet data found.  GENERAL EXAM: Patient is in no distress; well developed, nourished and groomed; neck is supple  CARDIOVASCULAR: Regular rate and rhythm, no murmurs, no carotid bruits  NEUROLOGIC: MENTAL STATUS: awake, alert, language fluent, comprehension intact, naming intact, fund of knowledge appropriate CRANIAL NERVE: no papilledema on fundoscopic exam, pupils equal and reactive to light, visual fields full to confrontation, extraocular muscles intact, no nystagmus, facial sensation and strength symmetric, hearing intact, palate elevates symmetrically, uvula midline, shoulder shrug symmetric, tongue midline. MOTOR: normal bulk and tone, full strength in the BUE, BLE SENSORY: normal and symmetric to light touch, temperature, vibration  COORDINATION: finger-nose-finger, fine finger movements normal REFLEXES: deep tendon reflexes present and symmetric GAIT/STATION: narrow based gait; able to walk tandem; romberg is negative    DIAGNOSTIC DATA (LABS, IMAGING, TESTING) - I reviewed patient records, labs, notes, testing and imaging myself where available.  Lab Results  Component Value Date   WBC 5.3 12/13/2014   HCT 41.7 12/13/2014   MCV 97 12/13/2014   PLT 361 12/13/2014      Component Value Date/Time   NA 140 12/13/2014 0939   K 4.4 12/13/2014 0939   CL 101 12/13/2014 0939   CO2 25 12/13/2014 0939   GLUCOSE 90 12/13/2014 0939   BUN 11 12/13/2014 0939   CREATININE 0.63 12/13/2014 0939   CALCIUM 9.7 12/13/2014 0939     PROT 7.3 12/13/2014 0939   ALBUMIN 4.4 12/13/2014 0939   AST 14 12/13/2014 0939   ALT 12 12/13/2014 0939   ALKPHOS 102 12/13/2014 0939   BILITOT 0.4 12/13/2014 0939   GFRNONAA 122 12/13/2014 0939   GFRAA 140 12/13/2014 0939   No results found for: CHOL, HDL, LDLCALC, LDLDIRECT, TRIG, CHOLHDL Lab Results  Component Value Date   HGBA1C 5.7 (H) 12/13/2014   Lab Results  Component Value Date   VITAMINB12 269 12/13/2014   Lab Results  Component Value Date   TSH 2.970 12/13/2014    12/13/14 MRI brain [I reviewed images myself and agree with interpretation. -VRP]  - normal    ASSESSMENT AND PLAN  30 y.o. year old female here with new onset headache, blurred vision, possible optic nerve edema, since April 2016. MRI, labs, and eye exam are unremarkable. Likely represents  migraine with aura.    Dx:   Migraine with aura and without status migrainosus, not intractable  Insomnia  Arthralgia of right temporomandibular joint     PLAN: - continue topiramate 50mg  BID for migraine prevention - continue cambia prn HA  Meds ordered this encounter  Medications  . Diclofenac Potassium (CAMBIA) 50 MG PACK    Sig: Take 50 mg by mouth as needed (for migraine; max 1 packet per day).    Dispense:  9 each    Refill:  12  . topiramate (TOPAMAX) 50 MG tablet    Sig: Take 1 tablet (50 mg total) by mouth 2 (two) times daily.    Dispense:  180 tablet    Refill:  4   Return in about 6 months (around 08/07/2016).    Suanne Marker, MD 02/05/2016, 3:05 PM Certified in Neurology, Neurophysiology and Neuroimaging  [facility] [address] [place], [place] [address] [phone]

## 2016-02-06 ENCOUNTER — Telehealth: Payer: Self-pay | Admitting: Family

## 2016-02-11 ENCOUNTER — Encounter: Payer: Self-pay | Admitting: Diagnostic Neuroimaging

## 2016-03-07 ENCOUNTER — Other Ambulatory Visit: Payer: Self-pay

## 2016-03-07 DIAGNOSIS — G4700 Insomnia, unspecified: Secondary | ICD-10-CM

## 2016-03-07 MED ORDER — ZOLPIDEM TARTRATE 10 MG PO TABS: 10.0000 mg | ORAL_TABLET | Freq: Every evening | ORAL | 1 refills | Status: DC | PRN

## 2016-03-07 NOTE — Telephone Encounter (Signed)
Please call in ambien with 1 refills 

## 2016-03-07 NOTE — Telephone Encounter (Signed)
Patient last seen in office on 01-21-16. Rx last filled on 01-03-16 for #30 with 1rf. Please advise and route to pool B

## 2016-04-17 ENCOUNTER — Ambulatory Visit (INDEPENDENT_AMBULATORY_CARE_PROVIDER_SITE_OTHER): Payer: 59 | Admitting: Family Medicine

## 2016-04-17 VITALS — BP 120/72 | HR 104 | Temp 98.0°F

## 2016-04-17 DIAGNOSIS — H9201 Otalgia, right ear: Secondary | ICD-10-CM | POA: Diagnosis not present

## 2016-04-17 DIAGNOSIS — J029 Acute pharyngitis, unspecified: Secondary | ICD-10-CM

## 2016-04-17 LAB — POCT RAPID STREP A (OFFICE): Rapid Strep A Screen: NEGATIVE

## 2016-04-17 NOTE — Progress Notes (Signed)
   Subjective:    Patient ID: Erica Rodriguez, female    DOB: 04-23-1986, 30 y.o.   MRN: [id number]  HPI She complains of a 10 day history of sore throat and now right earache but no fever, chills, cough, congestion, sneezing, itchy watery eyes, rhinorrhea.   Review of Systems     Objective:   Physical Exam Alert and in no distress. Tympanic membranes and canals are normal. Pharyngeal area is normal. Tonsils are normal in appearance and in fact the right tonsil is slightly smaller than left Neck is supple without adenopathy or thyromegaly. Cardiac exam shows a regular sinus rhythm without murmurs or gallops. Lungs are clear to auscultation. Strep screen negative    Assessment & Plan:  Sore throat - Plan: POCT rapid strep A  Acute pharyngitis, unspecified etiology  Otalgia of right ear Spleen that there is nothing to treat other than with pain medications. She is to return here if the symptoms worsen or don't improve over the next week or 2.

## 2016-05-08 ENCOUNTER — Other Ambulatory Visit: Payer: Self-pay

## 2016-05-08 DIAGNOSIS — G4709 Other insomnia: Secondary | ICD-10-CM

## 2016-05-08 MED ORDER — ZOLPIDEM TARTRATE 10 MG PO TABS: 10.0000 mg | ORAL_TABLET | Freq: Every evening | ORAL | 1 refills | Status: DC | PRN

## 2016-05-08 MED FILL — ZOLPIDEM TARTRATE 10 MG TAB: 10 | 30 days supply | Qty: 30 | Fill #0

## 2016-05-08 NOTE — Telephone Encounter (Signed)
[facility], patient advised via voicemail

## 2016-05-08 NOTE — Telephone Encounter (Signed)
Please call in ambien with 1 refills 

## 2016-06-06 MED FILL — ZOLPIDEM TARTRATE 10 MG TAB: 10 | 30 days supply | Qty: 30 | Fill #1

## 2016-06-16 MED FILL — TOPIRAMATE 50 MG TABLET: 50 | 90 days supply | Qty: 180 | Fill #0

## 2016-07-02 ENCOUNTER — Ambulatory Visit (INDEPENDENT_AMBULATORY_CARE_PROVIDER_SITE_OTHER): Payer: 59 | Admitting: Family Medicine

## 2016-07-02 ENCOUNTER — Encounter: Payer: Self-pay | Admitting: Family Medicine

## 2016-07-02 VITALS — BP 117/73 | HR 95 | Temp 97.7°F | Ht 61.5 in | Wt 147.0 lb

## 2016-07-02 DIAGNOSIS — J029 Acute pharyngitis, unspecified: Secondary | ICD-10-CM

## 2016-07-02 LAB — VERITOR FLU A/B WAIVED
INFLUENZA A: NEGATIVE
Influenza B: NEGATIVE

## 2016-07-02 NOTE — Progress Notes (Signed)
BP 117/73   Pulse 95   Temp 97.7 F (36.5 C) (Oral)   Ht 5' 1.5" (1.562 m)   Wt 147 lb (66.7 kg)   BMI 27.33 kg/m    Subjective:    Patient ID: Erica Rodriguez, female    DOB: 02-16-1986, 30 y.o.   MRN: [id number]  HPI: Erica Rodriguez is a 30 y.o. female presenting on 07/02/2016 for Generalized Body Aches (pt here today c/o body aches, sore throat)   HPI Body aches and cough and sore throat Patient has been having body aches and congestion and sore throat and sinus pressure and postnasal drainage that's been going on for the past 3 days. She says it really got bad over the last 2 days. She's been feeling feverish and chills but has not had an actual temperature. 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 She has used nasal sprays and Mucinex and Tylenol and sinus and cold medication without much success.  Relevant past medical, surgical, family and social history reviewed and updated as indicated. Interim medical history since our last visit reviewed. Allergies and medications reviewed and updated.  Review of Systems  Constitutional: Positive for chills and fever.  HENT: Positive for congestion, postnasal drip, rhinorrhea, sinus pressure, sneezing and sore throat. Negative for ear discharge and ear pain.   Eyes: Negative for pain, redness and visual disturbance.  Respiratory: Positive for cough. Negative for chest tightness and shortness of breath.   Cardiovascular: Negative for chest pain and leg swelling.  Genitourinary: Negative for difficulty urinating and dysuria.  Musculoskeletal: Positive for myalgias. Negative for back pain and gait problem.  Skin: Negative for rash.  Neurological: Negative for light-headedness and headaches.  Psychiatric/Behavioral: Negative for agitation and behavioral problems.  All other systems reviewed and are negative.   Per HPI unless specifically indicated above      Objective:      BP 117/73   Pulse 95   Temp 97.7 F (36.5 C) (Oral)   Ht 5' 1.5" (1.562 m)   Wt 147 lb (66.7 kg)   BMI 27.33 kg/m   Wt Readings from Last 3 Encounters:  07/02/16 147 lb (66.7 kg)  01/26/16 146 lb 12.8 oz (66.6 kg)  01/04/16 148 lb 3.2 oz (67.2 kg)    Physical Exam  Constitutional: She is oriented to person, place, and time. She appears well-developed and well-nourished. No distress.  HENT:  Right Ear: Tympanic membrane, external ear and ear canal normal.  Left Ear: Tympanic membrane, external ear and ear canal normal.  Nose: Mucosal edema and rhinorrhea present. No epistaxis. Right sinus exhibits no maxillary sinus tenderness and no frontal sinus tenderness. Left sinus exhibits no maxillary sinus tenderness and no frontal sinus tenderness.  Mouth/Throat: Uvula is midline and mucous membranes are normal. Posterior oropharyngeal edema and posterior oropharyngeal erythema present. No oropharyngeal exudate or tonsillar abscesses.  Eyes: Conjunctivae and EOM are normal.  Cardiovascular: Normal rate, regular rhythm, normal heart sounds and intact distal pulses.   No murmur heard. Pulmonary/Chest: Effort normal and breath sounds normal. No respiratory distress. She has no wheezes. She has no rales.  Musculoskeletal: Normal range of motion. She exhibits no edema or tenderness.  Neurological: She is alert and oriented to person, place, and time. Coordination normal.  Skin: Skin is warm and dry. No rash noted. She is not diaphoretic.  Psychiatric: She has a normal mood and affect. Her behavior is  normal.  Vitals reviewed.  Influenza: Negative    Assessment & Plan:   Problem List Items Addressed This Visit    None    Visit Diagnoses    Acute pharyngitis, unspecified etiology    -  Primary   Recommended using Flonase, Mucinex, nasal saline, antihistamine   Relevant Orders   Veritor Flu A/B Waived       Follow up plan: Return if symptoms worsen or fail to improve.  Counseling  provided for all of the vaccine components Orders Placed This Encounter  Procedures  . Veritor Flu A/B Waived    Arville Care, MD [facility] 07/02/2016, 6:40 PM

## 2016-07-04 ENCOUNTER — Telehealth: Payer: Self-pay | Admitting: Nurse Practitioner

## 2016-07-04 MED ORDER — AZITHROMYCIN 250 MG PO TABS
ORAL_TABLET | ORAL | 0 refills | Status: DC
Start: 2016-07-04 — End: 2016-08-12

## 2016-07-04 NOTE — Telephone Encounter (Signed)
Patient aware.

## 2016-07-04 NOTE — Telephone Encounter (Signed)
Patient seen Dr. Louanne Skyeettinger 12/27 and states she is worse than she was. C/o nasal congestion, cough, headache, chills and facial pain. Denies any fever. Has been using otc medications- mucinex, tylenol and ibuprofen but are not helping. Patient would like rx sent to [facility] in [place]. Please advise and send if approved.

## 2016-07-08 ENCOUNTER — Other Ambulatory Visit: Payer: Self-pay

## 2016-07-08 DIAGNOSIS — G4709 Other insomnia: Secondary | ICD-10-CM

## 2016-07-08 MED ORDER — TRETINOIN 0.025 % EX CREA: TOPICAL_CREAM | CUTANEOUS | 0 refills | Status: DC

## 2016-07-08 MED ORDER — ZOLPIDEM TARTRATE 10 MG PO TABS: 10.0000 mg | ORAL_TABLET | Freq: Every evening | ORAL | 1 refills | Status: DC | PRN

## 2016-07-08 MED FILL — ZOLPIDEM TARTRATE 10 MG TAB: 10 | 30 days supply | Qty: 30 | Fill #0

## 2016-07-08 NOTE — Telephone Encounter (Signed)
Please call in ambien with 1 refills 

## 2016-07-08 NOTE — Telephone Encounter (Signed)
Patient last seen in office on 07-02-16 for an acute visit. Please advise on refill. Rx last filled on 05-08-16 with 1 RF.

## 2016-07-14 ENCOUNTER — Encounter: Payer: Self-pay | Admitting: Family Medicine

## 2016-07-14 ENCOUNTER — Telehealth: Payer: 59 | Admitting: Family

## 2016-07-14 DIAGNOSIS — B9689 Other specified bacterial agents as the cause of diseases classified elsewhere: Secondary | ICD-10-CM | POA: Diagnosis not present

## 2016-07-14 DIAGNOSIS — N760 Acute vaginitis: Secondary | ICD-10-CM

## 2016-07-14 MED ORDER — FLUCONAZOLE 150 MG PO TABS: 150.0000 mg | ORAL_TABLET | Freq: Once | ORAL | 0 refills | Status: AC

## 2016-07-14 MED FILL — TRETINOIN 0.025% CREAM: 0.025 | 90 days supply | Qty: 45 | Fill #0

## 2016-07-14 NOTE — Progress Notes (Signed)
We are sorry that you are not feeling well. Here is how we plan to help! Based on what you shared with me it looks like you: May have a yeast vaginosis  Vaginosis is an inflammation of the vagina that can result in discharge, itching and pain. The cause is usually a change in the normal balance of vaginal bacteria or an infection. Vaginosis can also result from reduced estrogen levels after menopause.  The most common causes of vaginosis are:   Bacterial vaginosis which results from an overgrowth of one on several organisms that are normally present in your vagina.   Yeast infections which are caused by a naturally occurring fungus called candida.   Vaginal atrophy (atrophic vaginosis) which results from the thinning of the vagina from reduced estrogen levels after menopause.   Trichomoniasis which is caused by a parasite and is commonly transmitted by sexual intercourse.  Factors that increase your risk of developing vaginosis include: Marland Kitchen. Medications, such as antibiotics and steroids . Uncontrolled diabetes . Use of hygiene products such as bubble bath, vaginal spray or vaginal deodorant . Douching . Wearing damp or tight-fitting clothing . Using an intrauterine device (IUD) for birth control . Hormonal changes, such as those associated with pregnancy, birth control pills or menopause . Sexual activity . Having a sexually transmitted infection  Your treatment plan is A single Diflucan (fluconazole) 150mg  tablet once.  I have electronically sent this prescription into the pharmacy that you have chosen.   As far as the respiratory illness, I would say give it a little more time as they did send more antibiotics. Sometimes there is tissue irritation which can last longer. As far as the chills, there are many things that can cause this and it is not a concern as long as it clears up soon.  Be sure to take all of the medication as directed. Stop taking any medication if you develop a rash,  tongue swelling or shortness of breath. Mothers who are breast feeding should consider pumping and discarding their breast milk while on these antibiotics. However, there is no consensus that infant exposure at these doses would be harmful.  Remember that medication creams can weaken latex condoms. Marland Kitchen.   HOME CARE:  Good hygiene may prevent some types of vaginosis from recurring and may relieve some symptoms:  . Avoid baths, hot tubs and whirlpool spas. Rinse soap from your outer genital area after a shower, and dry the area well to prevent irritation. Don't use scented or harsh soaps, such as those with deodorant or antibacterial action. Marland Kitchen. Avoid irritants. These include scented tampons and pads. . Wipe from front to back after using the toilet. Doing so avoids spreading fecal bacteria to your vagina.  Other things that may help prevent vaginosis include:  Marland Kitchen. Don't douche. Your vagina doesn't require cleansing other than normal bathing. Repetitive douching disrupts the normal organisms that reside in the vagina and can actually increase your risk of vaginal infection. Douching won't clear up a vaginal infection. . Use a latex condom. Both female and female latex condoms may help you avoid infections spread by sexual contact. . Wear cotton underwear. Also wear pantyhose with a cotton crotch. If you feel comfortable without it, skip wearing underwear to bed. Yeast thrives in [employer] environments Your symptoms should improve in the next day or two.  GET HELP RIGHT AWAY IF:  . You have pain in your lower abdomen ( pelvic area or over your ovaries) . You develop nausea  or vomiting . You develop a fever . Your discharge changes or worsens . You have persistent pain with intercourse . You develop shortness of breath, a rapid pulse, or you faint.  These symptoms could be signs of problems or infections that need to be evaluated by a medical provider now.  MAKE SURE YOU    Understand these  instructions.  Will watch your condition.  Will get help right away if you are not doing well or get worse.  Your e-visit answers were reviewed by a board certified advanced clinical practitioner to complete your personal care plan. Depending upon the condition, your plan could have included both over the counter or prescription medications. Please review your pharmacy choice to make sure that you have choses a pharmacy that is open for you to pick up any needed prescription, Your safety is important to [place]. If you have drug allergies check your prescription carefully.   You can use MyChart to ask questions about today's visit, request a non-urgent call back, or ask for a work or school excuse for 24 hours related to this e-Visit. If it has been greater than 24 hours you will need to follow up with your provider, or enter a new e-Visit to address those concerns. You will get a MyChart message within the next two days asking about your experience. I hope that your e-visit has been valuable and will speed your recovery.

## 2016-07-15 ENCOUNTER — Telehealth: Payer: Self-pay | Admitting: Family Medicine

## 2016-07-15 MED ORDER — PREDNISONE 20 MG PO TABS: ORAL_TABLET | ORAL | 0 refills | Status: DC

## 2016-07-15 MED FILL — predniSONE 20 MG TABS: 20 | 5 days supply | Qty: 10 | Fill #0

## 2016-07-15 NOTE — Telephone Encounter (Signed)
Patient aware.

## 2016-07-15 NOTE — Telephone Encounter (Signed)
I sent prednisone over for her because is likely viral or allergic after the Z-Pak has been finished. The prednisone can help with the symptoms.

## 2016-07-21 ENCOUNTER — Other Ambulatory Visit: Payer: Self-pay | Admitting: *Deleted

## 2016-07-21 MED ORDER — OSELTAMIVIR PHOSPHATE 75 MG PO CAPS: 75.0000 mg | ORAL_CAPSULE | Freq: Every day | ORAL | 0 refills | Status: DC

## 2016-08-06 MED FILL — ZOLPIDEM TARTRATE 10 MG TAB: 10 | 30 days supply | Qty: 30 | Fill #1

## 2016-08-12 ENCOUNTER — Ambulatory Visit (INDEPENDENT_AMBULATORY_CARE_PROVIDER_SITE_OTHER): Payer: 59 | Admitting: Diagnostic Neuroimaging

## 2016-08-12 ENCOUNTER — Encounter: Payer: Self-pay | Admitting: Diagnostic Neuroimaging

## 2016-08-12 VITALS — BP 117/79 | HR 96 | Wt 149.0 lb

## 2016-08-12 DIAGNOSIS — G4452 New daily persistent headache (NDPH): Secondary | ICD-10-CM

## 2016-08-12 DIAGNOSIS — G43109 Migraine with aura, not intractable, without status migrainosus: Secondary | ICD-10-CM

## 2016-08-12 DIAGNOSIS — G4700 Insomnia, unspecified: Secondary | ICD-10-CM

## 2016-08-12 DIAGNOSIS — R631 Polydipsia: Secondary | ICD-10-CM

## 2016-08-12 MED ORDER — AMITRIPTYLINE HCL 25 MG PO TABS: 25.0000 mg | ORAL_TABLET | Freq: Every day | ORAL | 6 refills | Status: DC

## 2016-08-12 MED ORDER — TOPIRAMATE 50 MG PO TABS: 50.0000 mg | ORAL_TABLET | Freq: Two times a day (BID) | ORAL | 4 refills | Status: DC

## 2016-08-12 MED FILL — AMITRIPTYLINE HCL 25 MG TAB: 25 | 30 days supply | Qty: 30 | Fill #0

## 2016-08-12 NOTE — Patient Instructions (Addendum)
-   start amitriptyline 25mg  at bedtime (to help with migraine prevention, anxiety and insomnia)  - continue topiramate 50mg  twice a day for migraine prevention  - continue cambia as needed for breakthrough headache  To prevent or relieve headaches, try the following:   Cool Compress. Lie down and place a cool compress on your head.   Avoid headache triggers. If certain foods or odors seem to have triggered your migraines in the past, avoid them. A headache diary might help you identify triggers.   Include physical activity in your daily routine.   Manage stress. Find healthy ways to cope with the stressors, such as delegating tasks on your to-do list.   Practice relaxation techniques. Try deep breathing, yoga, massage and visualization.   Eat regularly. Eating regularly scheduled meals and maintaining a healthy diet might help prevent headaches. Also, drink plenty of fluids.   Follow a regular sleep schedule. Sleep deprivation might contribute to headaches  Consider biofeedback. With this mind-body technique, you learn to control certain bodily functions - such as muscle tension, heart rate and blood pressure - to prevent headaches or reduce headache pain.

## 2016-08-12 NOTE — Progress Notes (Signed)
[facility]  PATIENT: Erica Rodriguez DOB: Aug 06, 1985  REFERRING CLINICIAN:  HISTORY FROM: patient  REASON FOR VISIT: follow up   HISTORICAL  CHIEF COMPLAINT:  Chief Complaint  Patient presents with  . Migraine    rm 7, "constant headaches around L eye and down into L neck; worse since Nov; I've been stressed out a lot and don't sleep well; sometimes take OTC meds for HA""  . Follow-up    6 month    HISTORY OF PRESENT ILLNESS:   UPDATE 08/12/16: Since last visit, HA have worsened. Lately having more cravings for food and soda (2-3 per day). More anxiety and insomnia issues.   UPDATE 02/05/16: Since last visit having 2-3 migraine per month. Some mild hair loss. Also with right TMJ pain in April 2017, then June 2017. Had tx with PCP (toradol, prednisone, flexeril), and now improved.   UPDATE 08/07/15: Since last visit, HA are improved. Now having 3-4 HA per month. Cambia helping for rescue medication.   UPDATE 04/04/15: Since last visit, has continued with 6-7 mild HA per month; and occ severe right retroorbital severe HA, with nausea, sens to light; some "messed up" vision and spots before some HA. Dr. Cathey Endow ophthalmology eval'd patient, and no sig papilledema noted. MRI brain and labs unremarkable. Tried imitrex per PCP and had hot feeling, anxiety and flushing, with minimal benefit. Still not sleeping well at night, in spite of nightly ambien.   PRIOR HPI (12/13/14): 31 year old right-handed female here for evaluation of headache, eye pain, swollen optic nerves, retro-orbital pain. One and a half months ago patient noticed right-sided headache, with pressure and pulsating pushing sensation behind her right eye. She had some sensitivity to light, nausea, without phonophobia or vomiting. Initially she thought she was pregnant and took a home pregnancy test which was negative. Symptoms have been constant since that time. She has chronic insomnia and has been on Ambien for  several years. She has been on minocycline and trentinon cream for past 6 months. Patient went to optometrist for evaluation was found to have mild optic nerve head edema bilaterally.   REVIEW OF SYSTEMS: Full 14 system review of systems performed and negative except: headache insomnia excessive thirst light sens.    ALLERGIES: No Known Allergies  HOME MEDICATIONS: Outpatient Medications Prior to Visit  Medication Sig Dispense Refill  . cyclobenzaprine (FLEXERIL) 5 MG tablet Take 1 tablet (5 mg total) by mouth 3 (three) times daily as needed for muscle spasms. 60 tablet 1  . Diclofenac Potassium (CAMBIA) 50 MG PACK Take 50 mg by mouth as needed (for migraine; max 1 packet per day). 9 each 12  . fluticasone (FLONASE) 50 MCG/ACT nasal spray Place 2 sprays into both nostrils daily. 16 g 6  . topiramate (TOPAMAX) 50 MG tablet Take 1 tablet (50 mg total) by mouth 2 (two) times daily. 180 tablet 4  . tretinoin (RETIN-A) 0.025 % cream Apply bb size amt at bedtime 45 g 0  . zolpidem (AMBIEN) 10 MG tablet Take 1 tablet (10 mg total) by mouth at bedtime as needed. for sleep 30 tablet 1  . azithromycin (ZITHROMAX) 250 MG tablet Take 2 the first day and then one each day after. 6 tablet 0  . oseltamivir (TAMIFLU) 75 MG capsule Take 1 capsule (75 mg total) by mouth daily. 10 capsule 0  . predniSONE (DELTASONE) 20 MG tablet 2 po at same time daily for 5 days 10 tablet 0   No facility-administered medications  prior to visit.     PAST MEDICAL HISTORY: Past Medical History:  Diagnosis Date  . Depression   . Mild preeclampsia     PAST SURGICAL HISTORY: Past Surgical History:  Procedure Laterality Date  . CESAREAN SECTION    . FOOT SURGERY Bilateral   . WISDOM TOOTH EXTRACTION      FAMILY HISTORY: Family History  Problem Relation Age of Onset  . Thyroid disease Mother   . Hyperlipidemia Mother   . Arthritis/Rheumatoid Mother   . Diabetes Mother   . Hypertension Father   . Kidney disease  Father   . Cirrhosis Father   . Diabetes Maternal Grandmother   . Stroke Maternal Grandmother   . Stroke Maternal Grandfather     SOCIAL HISTORY:  Social History   Social History  . Marital status: Married    Spouse name: Casimiro NeedleMichael  . Number of children: 1  . Years of education: 2512   Occupational History  .      clincal registration   Social History Main Topics  . Smoking status: Never Smoker  . Smokeless tobacco: Never Used  . Alcohol use No  . Drug use: No  . Sexual activity: Yes   Other Topics Concern  . Not on file   Social History Narrative   Lives at home with husband and son   Drinks 1 soda a day      PHYSICAL EXAM  Vitals:   08/12/16 1243  BP: 117/79  Pulse: 96  Weight: 149 lb (67.6 kg)   Wt Readings from Last 3 Encounters:  08/12/16 149 lb (67.6 kg)  07/02/16 147 lb (66.7 kg)  01/26/16 146 lb 12.8 oz (66.6 kg)   Body mass index is 27.7 kg/m.  No exam data present  No flowsheet data found.  GENERAL EXAM: Patient is in no distress; well developed, nourished and groomed; neck is supple  CARDIOVASCULAR: Regular rate and rhythm, no murmurs, no carotid bruits  NEUROLOGIC: MENTAL STATUS: awake, alert, language fluent, comprehension intact, naming intact, fund of knowledge appropriate CRANIAL NERVE: no papilledema on fundoscopic exam, pupils equal and reactive to light, visual fields full to confrontation, extraocular muscles intact, no nystagmus, facial sensation and strength symmetric, hearing intact, palate elevates symmetrically, uvula midline, shoulder shrug symmetric, tongue midline. MOTOR: normal bulk and tone, full strength in the BUE, BLE SENSORY: normal and symmetric to light touch, temperature, vibration  COORDINATION: finger-nose-finger, fine finger movements normal REFLEXES: deep tendon reflexes present and symmetric GAIT/STATION: narrow based gait; able to walk tandem; romberg is negative    DIAGNOSTIC DATA (LABS, IMAGING,  TESTING) - I reviewed patient records, labs, notes, testing and imaging myself where available.  Lab Results  Component Value Date   WBC 5.3 12/13/2014   HCT 41.7 12/13/2014   MCV 97 12/13/2014   PLT 361 12/13/2014      Component Value Date/Time   NA 140 12/13/2014 0939   K 4.4 12/13/2014 0939   CL 101 12/13/2014 0939   CO2 25 12/13/2014 0939   GLUCOSE 90 12/13/2014 0939   BUN 11 12/13/2014 0939   CREATININE 0.63 12/13/2014 0939   CALCIUM 9.7 12/13/2014 0939   PROT 7.3 12/13/2014 0939   ALBUMIN 4.4 12/13/2014 0939   AST 14 12/13/2014 0939   ALT 12 12/13/2014 0939   ALKPHOS 102 12/13/2014 0939   BILITOT 0.4 12/13/2014 0939   GFRNONAA 122 12/13/2014 0939   GFRAA 140 12/13/2014 0939   No results found for: CHOL, HDL, LDLCALC, LDLDIRECT,  TRIG, CHOLHDL Lab Results  Component Value Date   HGBA1C 5.7 (H) 12/13/2014   Lab Results  Component Value Date   VITAMINB12 269 12/13/2014   Lab Results  Component Value Date   TSH 2.970 12/13/2014    12/13/14 MRI brain [I reviewed images myself and agree with interpretation. -[facility]]  - normal    ASSESSMENT AND PLAN  31 y.o. year old female here with new onset headache, blurred vision, possible optic nerve edema, since April 2016. MRI, labs, and eye exam are unremarkable. Likely represents migraine with aura. Also with chronic insomnia and mild anxiety symptoms.   Dx:   Migraine with aura and without status migrainosus, not intractable - Plan: Hemoglobin A1c  Insomnia, unspecified type - Plan: Hemoglobin A1c  New daily persistent headache  Polydipsia    PLAN: - start amitriptyline 25mg  at bedtime (to help with migraine prevention, anxiety and insomnia) - continue topiramate 50mg  BID for migraine prevention - continue cambia as needed for breakthrough headache - check A1c (polydipsia)  Meds ordered this encounter  Medications  . amitriptyline (ELAVIL) 25 MG tablet    Sig: Take 1 tablet (25 mg total) by mouth at  bedtime.    Dispense:  30 tablet    Refill:  6  . topiramate (TOPAMAX) 50 MG tablet    Sig: Take 1 tablet (50 mg total) by mouth 2 (two) times daily.    Dispense:  180 tablet    Refill:  4   Orders Placed This Encounter  Procedures  . Hemoglobin A1c   Return in about 3 months (around 11/09/2016).    Suanne Marker, MD 08/12/2016, 1:06 PM Certified in Neurology, Neurophysiology and Neuroimaging  [facility] [address], [address] [place], [place] [address] [phone]

## 2016-08-13 ENCOUNTER — Telehealth: Payer: Self-pay | Admitting: *Deleted

## 2016-08-13 LAB — HEMOGLOBIN A1C
ESTIMATED AVERAGE GLUCOSE: 103 mg/dL
HEMOGLOBIN A1C: 5.2 % (ref 4.8–5.6)

## 2016-08-13 NOTE — Telephone Encounter (Signed)
Spoke with patient and informed her that her lab result is normal and improved since 1 year ago. She verbalized understanding, appreciation.

## 2016-08-14 ENCOUNTER — Telehealth: Payer: 59 | Admitting: Nurse Practitioner

## 2016-08-14 DIAGNOSIS — J069 Acute upper respiratory infection, unspecified: Secondary | ICD-10-CM | POA: Diagnosis not present

## 2016-08-14 MED ORDER — FLUTICASONE PROPIONATE 50 MCG/ACT NA SUSP: 2.0000 | Freq: Every day | NASAL | 0 refills | Status: DC

## 2016-08-14 NOTE — Progress Notes (Signed)
We are sorry that you are not feeling well.  Here is how we plan to help!  Based on what you have shared with me it looks like you have upper respiratory tract inflammation.  This type of upper respiratory infection is viral and requires supportive care at this time.  Symptomatic treatment for the symptoms you have prescribed include:  Ibuprofen or Tylenol for fever as directed, an anti-histamines such as Benadryl or Zyretc and nasal sprays such as Flonase.  If you have a cough, you can use over the counter medications such as Delsym or Mucinex.  Based on your symptoms, I am prescribing Flonase 2 sprays in each nostril daily x 10 days.  I also recommend use of Ibuprofen or Tylenol on a scheduled basis to help with persistent symptoms such as every 8 hours.     You should follow up with your primary care physician if your throat pain/swelling and ear pain do not improve in the next 24-48 hours for additional testing/follow up or if your symptoms do not improve.    HOME CARE . Only take medications as instructed by your medical team. . Complete the entire course of an antibiotic. . Drink plenty of fluids and get plenty of rest. . Avoid close contacts especially the very young and the elderly . Cover your mouth if you cough or cough into your sleeve. . Always remember to wash your hands . A steam or ultrasonic humidifier can help congestion.   GET HELP RIGHT AWAY IF: . You develop worsening fever. . You become short of breath . You cough up blood. . Your symptoms persist after you have completed your treatment plan    MAKE SURE YOU   Understand these instructions.  Will watch your condition.  Will get help right away if you are not doing well or get worse.  Your e-visit answers were reviewed by a board certified advanced clinical practitioner to complete your personal care plan.  Depending on the condition, your plan could have included both over the counter or prescription  medications. If there is a problem please reply  once you have received a response from your provider. Your safety is important to us.  If you have drug allergies check your prescription carefully.    You can use MyChart to ask questions about today's visit, request a non-urgent call back, or ask for a work or school excuse for 24 hours related to this e-Visit. If it has been greater than 24 hours you will need to follow up with your provider, or enter a new e-Visit to address those concerns. You will get an e-mail in the next two days asking about your experience.  I hope that your e-visit has been valuable and will speed your recovery. Thank you for using e-visits.

## 2016-08-15 MED FILL — FLUTICASONE PROP 50 MCG SPR: 50 | 30 days supply | Qty: 16 | Fill #0

## 2016-08-15 MED FILL — CAMBIA 50 MG POWDER PACKET: 50 | 30 days supply | Qty: 9 | Fill #0

## 2016-09-04 ENCOUNTER — Other Ambulatory Visit: Payer: Self-pay | Admitting: Nurse Practitioner

## 2016-09-04 ENCOUNTER — Encounter: Payer: Self-pay | Admitting: Nurse Practitioner

## 2016-09-04 DIAGNOSIS — G4709 Other insomnia: Secondary | ICD-10-CM

## 2016-09-04 MED ORDER — ZOLPIDEM TARTRATE 10 MG PO TABS: 10.0000 mg | ORAL_TABLET | Freq: Every evening | ORAL | 1 refills | Status: DC | PRN

## 2016-09-04 NOTE — Progress Notes (Unsigned)
Please call in ambien 10 mg  1 po qhs #30 with 1 refills 

## 2016-09-05 MED FILL — ZOLPIDEM TARTRATE 10 MG TAB: 10 | 30 days supply | Qty: 30 | Fill #0

## 2016-09-05 NOTE — Progress Notes (Signed)
rx called in

## 2016-09-09 MED FILL — AMITRIPTYLINE HCL 25 MG TAB: 25 | 30 days supply | Qty: 30 | Fill #1

## 2016-09-09 MED FILL — TOPIRAMATE 50 MG TABLET: 50 | 90 days supply | Qty: 180 | Fill #0

## 2016-10-06 MED FILL — ZOLPIDEM TARTRATE 10 MG TAB: 10 | 30 days supply | Qty: 30 | Fill #1

## 2016-11-01 ENCOUNTER — Telehealth: Payer: 59 | Admitting: Physician Assistant

## 2016-11-01 DIAGNOSIS — N760 Acute vaginitis: Secondary | ICD-10-CM | POA: Diagnosis not present

## 2016-11-01 MED ORDER — CLINDAMYCIN PHOSPHATE 2 % VA CREA: 1.0000 | TOPICAL_CREAM | Freq: Every day | VAGINAL | 0 refills | Status: DC

## 2016-11-01 NOTE — Progress Notes (Signed)
We are sorry that you are not feeling well. Here is how we plan to help! Based on what you shared with me it looks like you: May have a vaginosis due to bacteria giving the itching with yellow/green discharge.  Vaginosis is an inflammation of the vagina that can result in discharge, itching and pain. The cause is usually a change in the normal balance of vaginal bacteria or an infection. Vaginosis can also result from reduced estrogen levels after menopause.  The most common causes of vaginosis are:   Bacterial vaginosis which results from an overgrowth of one on several organisms that are normally present in your vagina.   Yeast infections which are caused by a naturally occurring fungus called candida.   Vaginal atrophy (atrophic vaginosis) which results from the thinning of the vagina from reduced estrogen levels after menopause.   Trichomoniasis which is caused by a parasite and is commonly transmitted by sexual intercourse.  Factors that increase your risk of developing vaginosis include: Marland Kitchen Medications, such as antibiotics and steroids . Uncontrolled diabetes . Use of hygiene products such as bubble bath, vaginal spray or vaginal deodorant . Douching . Wearing damp or tight-fitting clothing . Using an intrauterine device (IUD) for birth control . Hormonal changes, such as those associated with pregnancy, birth control pills or menopause . Sexual activity . Having a sexually transmitted infection  Your treatment plan is Clindamycin vaginal cream 5 grams applied vaginally for 7 days.  I have electronically sent this prescription into the pharmacy that you have chosen.  Be sure to take all of the medication as directed. Stop taking any medication if you develop a rash, tongue swelling or shortness of breath. Mothers who are breast feeding should consider pumping and discarding their breast milk while on these antibiotics. However, there is no consensus that infant exposure at these  doses would be harmful.  Remember that medication creams can weaken latex condoms. Marland Kitchen   HOME CARE:  Good hygiene may prevent some types of vaginosis from recurring and may relieve some symptoms:  . Avoid baths, hot tubs and whirlpool spas. Rinse soap from your outer genital area after a shower, and dry the area well to prevent irritation. Don't use scented or harsh soaps, such as those with deodorant or antibacterial action. Marland Kitchen Avoid irritants. These include scented tampons and pads. . Wipe from front to back after using the toilet. Doing so avoids spreading fecal bacteria to your vagina.  Other things that may help prevent vaginosis include:  Marland Kitchen Don't douche. Your vagina doesn't require cleansing other than normal bathing. Repetitive douching disrupts the normal organisms that reside in the vagina and can actually increase your risk of vaginal infection. Douching won't clear up a vaginal infection. . Use a latex condom. Both female and female latex condoms may help you avoid infections spread by sexual contact. . Wear cotton underwear. Also wear pantyhose with a cotton crotch. If you feel comfortable without it, skip wearing underwear to bed. Yeast thrives in [employer] Your symptoms should improve in the next day or two.  GET HELP RIGHT AWAY IF:  . You have pain in your lower abdomen ( pelvic area or over your ovaries) . You develop nausea or vomiting . You develop a fever . Your discharge changes or worsens . You have persistent pain with intercourse . You develop shortness of breath, a rapid pulse, or you faint.  These symptoms could be signs of problems or infections that need to be evaluated  by a medical provider now.  MAKE SURE YOU    Understand these instructions.  Will watch your condition.  Will get help right away if you are not doing well or get worse.  Your e-visit answers were reviewed by a board certified advanced clinical practitioner to complete your  personal care plan. Depending upon the condition, your plan could have included both over the counter or prescription medications. Please review your pharmacy choice to make sure that you have choses a pharmacy that is open for you to pick up any needed prescription, Your safety is important to [place]. If you have drug allergies check your prescription carefully.   You can use MyChart to ask questions about today's visit, request a non-urgent call back, or ask for a work or school excuse for 24 hours related to this e-Visit. If it has been greater than 24 hours you will need to follow up with your provider, or enter a new e-Visit to address those concerns. You will get a MyChart message within the next two days asking about your experience. I hope that your e-visit has been valuable and will speed your recovery.

## 2016-11-06 ENCOUNTER — Other Ambulatory Visit: Payer: Self-pay

## 2016-11-06 DIAGNOSIS — G4709 Other insomnia: Secondary | ICD-10-CM

## 2016-11-06 MED ORDER — ZOLPIDEM TARTRATE 10 MG PO TABS: 10.0000 mg | ORAL_TABLET | Freq: Every evening | ORAL | 1 refills | Status: DC | PRN

## 2016-11-06 MED FILL — ZOLPIDEM TARTRATE 10 MG TAB: 10 | 30 days supply | Qty: 30 | Fill #0

## 2016-11-06 NOTE — Telephone Encounter (Signed)
Last OV 06-28-16. Please advise on refill

## 2016-11-06 NOTE — Telephone Encounter (Signed)
[facility]. Patient notified

## 2016-11-06 NOTE — Telephone Encounter (Signed)
Please call in ambien with 1 refills 

## 2016-11-07 ENCOUNTER — Telehealth: Payer: 59 | Admitting: Nurse Practitioner

## 2016-11-07 DIAGNOSIS — B3731 Acute candidiasis of vulva and vagina: Secondary | ICD-10-CM

## 2016-11-07 DIAGNOSIS — B373 Candidiasis of vulva and vagina: Secondary | ICD-10-CM

## 2016-11-07 MED ORDER — FLUCONAZOLE 150 MG PO TABS: 150.0000 mg | ORAL_TABLET | Freq: Once | ORAL | 0 refills | Status: AC

## 2016-11-07 NOTE — Progress Notes (Signed)

## 2016-11-11 ENCOUNTER — Encounter: Payer: Self-pay | Admitting: Diagnostic Neuroimaging

## 2016-11-11 ENCOUNTER — Ambulatory Visit (INDEPENDENT_AMBULATORY_CARE_PROVIDER_SITE_OTHER): Payer: 59 | Admitting: Diagnostic Neuroimaging

## 2016-11-11 VITALS — BP 112/75 | HR 95 | Wt 146.8 lb

## 2016-11-11 DIAGNOSIS — G43109 Migraine with aura, not intractable, without status migrainosus: Secondary | ICD-10-CM | POA: Diagnosis not present

## 2016-11-11 DIAGNOSIS — G4700 Insomnia, unspecified: Secondary | ICD-10-CM

## 2016-11-11 MED ORDER — TOPIRAMATE 50 MG PO TABS: 50.0000 mg | ORAL_TABLET | Freq: Two times a day (BID) | ORAL | 4 refills | Status: DC

## 2016-11-11 NOTE — Progress Notes (Signed)
[facility]  PATIENT: Erica Rodriguez DOB: 28-Dec-1985  REFERRING CLINICIAN:  HISTORY FROM: patient  REASON FOR VISIT: follow up   HISTORICAL  CHIEF COMPLAINT:  Chief Complaint  Patient presents with  . Migraine    rm 7, "less migraines, less intense when I have them; stopped amitriptyline-constipation, grogginess; increased topamax to 2 tabs in am, 1 tab at night"  . Follow-up    3 month    HISTORY OF PRESENT ILLNESS:   UPDATE 11/11/16: Since last visit, tried amitriptyline, x 2 months, then stopped due to side effects. Tolerating TPX. Headaches are slightly better. Sleep is better. Less caffeine.   UPDATE 08/12/16: Since last visit, HA have worsened. Lately having more cravings for food and soda (2-3 per day). More anxiety and insomnia issues.   UPDATE 02/05/16: Since last visit having 2-3 migraine per month. Some mild hair loss. Also with right TMJ pain in April 2017, then June 2017. Had tx with PCP (toradol, prednisone, flexeril), and now improved.   UPDATE 08/07/15: Since last visit, HA are improved. Now having 3-4 HA per month. Cambia helping for rescue medication.   UPDATE 04/04/15: Since last visit, has continued with 6-7 mild HA per month; and occ severe right retroorbital severe HA, with nausea, sens to light; some "messed up" vision and spots before some HA. Dr. Cathey Endow ophthalmology eval'd patient, and no sig papilledema noted. MRI brain and labs unremarkable. Tried imitrex per PCP and had hot feeling, anxiety and flushing, with minimal benefit. Still not sleeping well at night, in spite of nightly ambien.   PRIOR HPI (12/13/14): 31 year old right-handed female here for evaluation of headache, eye pain, swollen optic nerves, retro-orbital pain. One and a half months ago patient noticed right-sided headache, with pressure and pulsating pushing sensation behind her right eye. She had some sensitivity to light, nausea, without phonophobia or vomiting. Initially she  thought she was pregnant and took a home pregnancy test which was negative. Symptoms have been constant since that time. She has chronic insomnia and has been on Ambien for several years. She has been on minocycline and trentinon cream for past 6 months. Patient went to optometrist for evaluation was found to have mild optic nerve head edema bilaterally.   REVIEW OF SYSTEMS: Full 14 system review of systems performed and negative except: headache insomnia.   ALLERGIES: Allergies  Allergen Reactions  . Amitriptyline Other (See Comments)    Constipation, grogginess    HOME MEDICATIONS: Outpatient Medications Prior to Visit  Medication Sig Dispense Refill  . cyclobenzaprine (FLEXERIL) 5 MG tablet Take 1 tablet (5 mg total) by mouth 3 (three) times daily as needed for muscle spasms. 60 tablet 1  . Diclofenac Potassium (CAMBIA) 50 MG PACK Take 50 mg by mouth as needed (for migraine; max 1 packet per day). 9 each 12  . topiramate (TOPAMAX) 50 MG tablet Take 1 tablet (50 mg total) by mouth 2 (two) times daily. 180 tablet 4  . tretinoin (RETIN-A) 0.025 % cream Apply bb size amt at bedtime 45 g 0  . zolpidem (AMBIEN) 10 MG tablet Take 1 tablet (10 mg total) by mouth at bedtime as needed. for sleep 30 tablet 1  . amitriptyline (ELAVIL) 25 MG tablet Take 1 tablet (25 mg total) by mouth at bedtime. (Patient not taking: Reported on 11/11/2016) 30 tablet 6  . fluticasone (FLONASE) 50 MCG/ACT nasal spray Place 2 sprays into both nostrils daily. (Patient not taking: Reported on 11/11/2016) 16 g 6  .  clindamycin (CLEOCIN) 2 % vaginal cream Place 1 Applicatorful vaginally at bedtime. Insert 1 Applicatorful of medication into the vagina once nightly x 7 nights 35 g 0  . fluticasone (FLONASE) 50 MCG/ACT nasal spray Place 2 sprays into both nostrils daily. 16 g 0   No facility-administered medications prior to visit.     PAST MEDICAL HISTORY: Past Medical History:  Diagnosis Date  . Depression   . Headache      migraines  . Mild preeclampsia     PAST SURGICAL HISTORY: Past Surgical History:  Procedure Laterality Date  . CESAREAN SECTION    . FOOT SURGERY Bilateral   . WISDOM TOOTH EXTRACTION      FAMILY HISTORY: Family History  Problem Relation Age of Onset  . Thyroid disease Mother   . Hyperlipidemia Mother   . Arthritis/Rheumatoid Mother   . Diabetes Mother   . Hypertension Father   . Kidney disease Father   . Cirrhosis Father   . Diabetes Maternal Grandmother   . Stroke Maternal Grandmother   . Stroke Maternal Grandfather     SOCIAL HISTORY:  Social History   Social History  . Marital status: Married    Spouse name: Casimiro Needle  . Number of children: 1  . Years of education: 42   Occupational History  .      clincal registration   Social History Main Topics  . Smoking status: Never Smoker  . Smokeless tobacco: Never Used  . Alcohol use No  . Drug use: No  . Sexual activity: Yes   Other Topics Concern  . Not on file   Social History Narrative   Lives at home with husband and son   Drinks 1 soda a day      PHYSICAL EXAM  Vitals:   11/11/16 1511  BP: 112/75  Pulse: 95  Weight: 146 lb 12.8 oz (66.6 kg)   Wt Readings from Last 3 Encounters:  11/11/16 146 lb 12.8 oz (66.6 kg)  08/12/16 149 lb (67.6 kg)  07/02/16 147 lb (66.7 kg)   Body mass index is 27.29 kg/m.  No exam data present  No flowsheet data found.  GENERAL EXAM: Patient is in no distress; well developed, nourished and groomed; neck is supple  CARDIOVASCULAR: Regular rate and rhythm, no murmurs, no carotid bruits  NEUROLOGIC: MENTAL STATUS: awake, alert, language fluent, comprehension intact, naming intact, fund of knowledge appropriate CRANIAL NERVE: no papilledema on fundoscopic exam, pupils equal and reactive to light, visual fields full to confrontation, extraocular muscles intact, no nystagmus, facial sensation and strength symmetric, hearing intact, palate elevates  symmetrically, uvula midline, shoulder shrug symmetric, tongue midline. MOTOR: normal bulk and tone, full strength in the BUE, BLE SENSORY: normal and symmetric to light touch, temperature, vibration  COORDINATION: finger-nose-finger, fine finger movements normal REFLEXES: deep tendon reflexes present and symmetric GAIT/STATION: narrow based gait; able to walk tandem; romberg is negative    DIAGNOSTIC DATA (LABS, IMAGING, TESTING) - I reviewed patient records, labs, notes, testing and imaging myself where available.  Lab Results  Component Value Date   WBC 5.3 12/13/2014   HCT 41.7 12/13/2014   MCV 97 12/13/2014   PLT 361 12/13/2014      Component Value Date/Time   NA 140 12/13/2014 0939   K 4.4 12/13/2014 0939   CL 101 12/13/2014 0939   CO2 25 12/13/2014 0939   GLUCOSE 90 12/13/2014 0939   BUN 11 12/13/2014 0939   CREATININE 0.63 12/13/2014 0939   CALCIUM  9.7 12/13/2014 0939   PROT 7.3 12/13/2014 0939   ALBUMIN 4.4 12/13/2014 0939   AST 14 12/13/2014 0939   ALT 12 12/13/2014 0939   ALKPHOS 102 12/13/2014 0939   BILITOT 0.4 12/13/2014 0939   GFRNONAA 122 12/13/2014 0939   GFRAA 140 12/13/2014 0939   No results found for: CHOL, HDL, LDLCALC, LDLDIRECT, TRIG, CHOLHDL Lab Results  Component Value Date   HGBA1C 5.2 08/12/2016   Lab Results  Component Value Date   VITAMINB12 269 12/13/2014   Lab Results  Component Value Date   TSH 2.970 12/13/2014    12/13/14 MRI brain [I reviewed images myself and agree with interpretation. -[facility]P]  - normal    ASSESSMENT AND PLAN  31 y.o. year old female here with new onset headache, blurred vision, possible optic nerve edema (but later found to be normal), since April 2016. MRI, labs, and eye exam are unremarkable. Likely represents migraine with aura. Also with chronic insomnia and mild anxiety symptoms.  Meds tried and failed: amitriptyline, imitrex, toradol, flexeril   Dx:   Migraine with aura and without status  migrainosus, not intractable  Insomnia, unspecified type    PLAN: - continue topiramate 50mg  twice a day; may increase up to 100mg  twice a day; for migraine prevention - continue cambia as needed for breakthrough headache - could consider sertraline 25mg  daily for anxiety, insomnia and migraine prevention  Meds ordered this encounter  Medications  . topiramate (TOPAMAX) 50 MG tablet    Sig: Take 1-2 tablets (50-100 mg total) by mouth 2 (two) times daily.    Dispense:  360 tablet    Refill:  4   Return in about 6 months (around 05/14/2017).    Suanne Marker, MD 11/11/2016, 3:42 PM Certified in Neurology, Neurophysiology and Neuroimaging  [facility] [address] [place], [place] [address] [phone]

## 2016-11-20 MED FILL — TOPIRAMATE 50 MG TABLET: 50 | 90 days supply | Qty: 360 | Fill #0

## 2016-12-05 MED FILL — ZOLPIDEM TARTRATE 10 MG TAB: 10 | 30 days supply | Qty: 30 | Fill #1

## 2016-12-08 ENCOUNTER — Telehealth: Payer: 59 | Admitting: Family

## 2016-12-08 DIAGNOSIS — N898 Other specified noninflammatory disorders of vagina: Secondary | ICD-10-CM

## 2016-12-08 NOTE — Progress Notes (Signed)
Based on what you shared with me it looks like you have a serious condition that should be evaluated in a face to face office visit.  NOTE: Even if you have entered your credit card information for this eVisit, you will not be charged.   If you are having a true medical emergency please call 911.  If you need an urgent face to face visit, [facility] has four urgent care centers for your convenience.  If you need care fast and have a high deductible or no insurance consider:  It looks as if you have been treated several times for vaginal symptoms over the last few month. You need to follow up with your primary care provider to rule out other problems.    [email or web address]  [phone]  [address], [address] [place], [place] [address] 8 am to 8 pm Monday-Friday 10 am to 4 pm Saturday-Sunday   The following sites will take your  insurance:    . [facility]  [phone] Get Driving Directions Find a Provider at this Location  [address] [place], [place] [address] . 10 am to 8 pm Monday-Friday . 12 pm to 8 pm Saturday-Sunday   . [facility]  [phone] Get Driving Directions Find a Provider at this Location  [address] [place], [place] [address] . 8 am to 8 pm Monday-Friday . 9 am to 6 pm Saturday . 11 am to 6 pm Sunday   . [facility] [place]  [phone] Get Driving Directions  [address] [place], [place] [address] . 8 am to 8 pm Monday-Friday . 8 am to 4 pm Saturday-Sunday   Your e-visit answers were reviewed by a board certified advanced clinical practitioner to complete your personal care plan.  Thank you for using e-Visits.

## 2016-12-11 ENCOUNTER — Encounter: Payer: Self-pay | Admitting: Family

## 2016-12-11 ENCOUNTER — Ambulatory Visit (INDEPENDENT_AMBULATORY_CARE_PROVIDER_SITE_OTHER): Payer: 59 | Admitting: Family

## 2016-12-11 VITALS — BP 121/83 | HR 82 | Temp 98.5°F | Ht 61.5 in | Wt 144.0 lb

## 2016-12-11 DIAGNOSIS — B373 Candidiasis of vulva and vagina: Secondary | ICD-10-CM | POA: Diagnosis not present

## 2016-12-11 DIAGNOSIS — R5383 Other fatigue: Secondary | ICD-10-CM | POA: Diagnosis not present

## 2016-12-11 DIAGNOSIS — B3731 Acute candidiasis of vulva and vagina: Secondary | ICD-10-CM

## 2016-12-11 DIAGNOSIS — L659 Nonscarring hair loss, unspecified: Secondary | ICD-10-CM | POA: Diagnosis not present

## 2016-12-11 DIAGNOSIS — L749 Eccrine sweat disorder, unspecified: Secondary | ICD-10-CM

## 2016-12-11 MED ORDER — FLUTICASONE PROPIONATE 50 MCG/ACT NA SUSP: 2.0000 | Freq: Every day | NASAL | 6 refills | Status: DC

## 2016-12-11 MED ORDER — BUTOCONAZOLE NITRATE (1 DOSE) 2 % VA CREA: TOPICAL_CREAM | VAGINAL | 0 refills | Status: DC

## 2016-12-11 MED FILL — FLUTICASONE PROP 50 MCG SPR: 50 | 30 days supply | Qty: 16 | Fill #0

## 2016-12-11 NOTE — Addendum Note (Signed)
Addended by: Jannifer RodneyHAWKS, Jeanpaul Biehl on: 12/11/2016 09:13 AM   Modules accepted: Orders

## 2016-12-11 NOTE — Progress Notes (Signed)
   Subjective:    Patient ID: TECORA EUSTACHE, female    DOB: [birth year], 31 y.o.   MRN: [id number]  HPI Pt presents to the office today with hair thinning and sweats over the last few months that has become worse. PT states she feels like she more sensitive to cold and hot temperature. She does have mild fatigue.   Pt complaining of vaginal itching that [place] two [place] ago [place] She denies any discharge or dysuria.   Review of Systems  Constitutional: Positive for fatigue.  All other systems reviewed and are negative.      Objective:   Physical Exam  Constitutional: She is oriented to person, place, and time. She appears well-developed and well-nourished. No distress.  HENT:  Head: Normocephalic and atraumatic.  Right Ear: External ear normal.  Mouth/Throat: Oropharynx is clear and moist.  Eyes: Pupils are equal, round, and reactive to light.  Neck: Normal range of motion. Neck supple. No thyromegaly present.  Cardiovascular: Normal rate, regular rhythm, normal heart sounds and intact distal pulses.   No murmur heard. Pulmonary/Chest: Effort normal and breath sounds normal. No respiratory distress. She has no wheezes.  Abdominal: Soft. Bowel sounds are normal. She exhibits no distension. There is no tenderness.  Musculoskeletal: Normal range of motion. She exhibits no edema or tenderness.  Neurological: She is alert and oriented to person, place, and time.  Skin: Skin is warm and dry.  Psychiatric: She has a normal mood and affect. Her behavior is normal. Judgment and thought content normal.  Vitals reviewed.    BP 121/83   Pulse 82   Temp 98.5 F (36.9 C) (Oral)   Ht 5' 1.5" (1.562 m)   Wt 144 lb (65.3 kg)   BMI 26.77 kg/m      Assessment & Plan:  1. Hair thinning - Anemia Profile B - CMP14+EGFR - Thyroid Panel With TSH  2. Other fatigue - Anemia Profile B - CMP14+EGFR - Thyroid Panel With TSH  3. Sweating abnormality -  Anemia Profile B - CMP14+EGFR - Thyroid Panel With TSH  4. Vagina, candidiasis -Keep clean and dry Probiotic dialy - Butoconazole Nitrate, 1 Dose, 2 % CREA; Insert 5 g into vagina once daily  Dispense: 10 g; Refill: 0  Encourage 8-9 hours of sleep Encourage healthy diet and exercise Labs pending RTO prn   Erica Dun, FNP

## 2016-12-11 NOTE — Patient Instructions (Signed)
Hypothyroidism Hypothyroidism is a disorder of the thyroid. The thyroid is a large gland that is located in the lower front of the neck. The thyroid releases hormones that control how the body works. With hypothyroidism, the thyroid does not make enough of these hormones. What are the causes? Causes of hypothyroidism may include:  Viral infections.  Pregnancy.  Your own defense system (immune system) attacking your thyroid.  Certain medicines.  Birth defects.  Past radiation treatments to your head or neck.  Past treatment with radioactive iodine.  Past surgical removal of part or all of your thyroid.  Problems with the gland that is located in the center of your brain (pituitary).  What are the signs or symptoms? Signs and symptoms of hypothyroidism may include:  Feeling as though you have no energy (lethargy).  Inability to tolerate cold.  Weight gain that is not explained by a change in diet or exercise habits.  Dry skin.  Coarse hair.  Menstrual irregularity.  Slowing of thought processes.  Constipation.  Sadness or depression.  How is this diagnosed? Your health care provider may diagnose hypothyroidism with blood tests and ultrasound tests. How is this treated? Hypothyroidism is treated with medicine that replaces the hormones that your body does not make. After you begin treatment, it may take several weeks for symptoms to go away. Follow these instructions at home:  Take medicines only as directed by your health care provider.  If you start taking any new medicines, tell your health care provider.  Keep all follow-up visits as directed by your health care provider. This is important. As your condition improves, your dosage needs may change. You will need to have blood tests regularly so that your health care provider can watch your condition. Contact a health care provider if:  Your symptoms do not get better with treatment.  You are taking thyroid  replacement medicine and: ? You sweat excessively. ? You have tremors. ? You feel anxious. ? You lose weight rapidly. ? You cannot tolerate heat. ? You have emotional swings. ? You have diarrhea. ? You feel weak. Get help right away if:  You develop chest pain.  You develop an irregular heartbeat.  You develop a rapid heartbeat. This information is not intended to replace advice given to you by your health care provider. Make sure you discuss any questions you have with your health care provider. Document Released: 06/23/2005 Document Revised: 11/29/2015 Document Reviewed: 11/08/2013 Elsevier Interactive Patient Education  2017 Elsevier Inc.  

## 2016-12-12 ENCOUNTER — Other Ambulatory Visit: Payer: Self-pay | Admitting: Nurse Practitioner

## 2016-12-12 ENCOUNTER — Encounter: Payer: Self-pay | Admitting: Family

## 2016-12-12 LAB — ANEMIA PROFILE B
BASOS: 0 %
Basophils Absolute: 0 10*3/uL (ref 0.0–0.2)
EOS (ABSOLUTE): 0.1 10*3/uL (ref 0.0–0.4)
Eos: 2 %
FERRITIN: 146 ng/mL (ref 15–150)
FOLATE: 19.3 ng/mL (ref >3.0)
HEMATOCRIT: 40.3 % (ref 34.0–46.6)
HEMOGLOBIN: 13.5 g/dL (ref 11.1–15.9)
Immature Grans (Abs): 0 10*3/uL (ref 0.0–0.1)
Immature Granulocytes: 0 %
Iron Saturation: 37 % (ref 15–55)
Iron: 99 ug/dL (ref 27–159)
LYMPHS ABS: 1.5 10*3/uL (ref 0.7–3.1)
Lymphs: 26 %
MCH: 32.4 pg (ref 26.6–33.0)
MCHC: 33.5 g/dL (ref 31.5–35.7)
MCV: 97 fL (ref 79–97)
MONOS ABS: 0.4 10*3/uL (ref 0.1–0.9)
Monocytes: 6 %
NEUTROS ABS: 3.9 10*3/uL (ref 1.4–7.0)
Neutrophils: 66 %
PLATELETS: 378 10*3/uL (ref 150–379)
RBC: 4.17 x10E6/uL (ref 3.77–5.28)
RDW: 13.2 % (ref 12.3–15.4)
RETIC CT PCT: 0.7 % (ref 0.6–2.6)
TIBC: 269 ug/dL (ref 250–450)
UIBC: 170 ug/dL (ref 131–425)
VITAMIN B 12: 249 pg/mL (ref 232–1245)
WBC: 5.9 10*3/uL (ref 3.4–10.8)

## 2016-12-12 LAB — CMP14+EGFR
A/G RATIO: 1.7 (ref 1.2–2.2)
ALK PHOS: 83 IU/L (ref 39–117)
ALT: 10 IU/L (ref 0–32)
AST: 14 IU/L (ref 0–40)
Albumin: 4.6 g/dL (ref 3.5–5.5)
BILIRUBIN TOTAL: 0.4 mg/dL (ref 0.0–1.2)
BUN / CREAT RATIO: 16 (ref 9–23)
BUN: 11 mg/dL (ref 6–20)
CHLORIDE: 108 mmol/L — AB (ref 96–106)
CO2: 20 mmol/L (ref 18–29)
Calcium: 9.3 mg/dL (ref 8.7–10.2)
Creatinine, Ser: 0.67 mg/dL (ref 0.57–1.00)
GFR calc Af Amer: 135 mL/min/{1.73_m2} (ref >59)
GFR calc non Af Amer: 118 mL/min/{1.73_m2} (ref >59)
Globulin, Total: 2.7 g/dL (ref 1.5–4.5)
Glucose: 82 mg/dL (ref 65–99)
POTASSIUM: 4.5 mmol/L (ref 3.5–5.2)
Sodium: 143 mmol/L (ref 134–144)
Total Protein: 7.3 g/dL (ref 6.0–8.5)

## 2016-12-12 LAB — THYROID PANEL WITH TSH
Free Thyroxine Index: 2.3 (ref 1.2–4.9)
T3 UPTAKE RATIO: 28 % (ref 24–39)
T4, Total: 8.3 ug/dL (ref 4.5–12.0)
TSH: 1.83 u[IU]/mL (ref 0.450–4.500)

## 2016-12-12 MED ORDER — FLUCONAZOLE 150 MG PO TABS: 150.0000 mg | ORAL_TABLET | Freq: Once | ORAL | 0 refills | Status: AC

## 2017-01-01 ENCOUNTER — Other Ambulatory Visit: Payer: Self-pay

## 2017-01-01 ENCOUNTER — Telehealth: Payer: Self-pay

## 2017-01-01 DIAGNOSIS — G4709 Other insomnia: Secondary | ICD-10-CM

## 2017-01-01 MED ORDER — ZOLPIDEM TARTRATE 10 MG PO TABS: 10.0000 mg | ORAL_TABLET | Freq: Every evening | ORAL | 1 refills | Status: DC | PRN

## 2017-01-01 MED FILL — ZOLPIDEM TARTRATE 10 MG TAB: 10 | 30 days supply | Qty: 30 | Fill #0

## 2017-01-01 NOTE — Telephone Encounter (Signed)
Patient last seen in office on 12-07-16. Looks like rx last filled on 12-07-16. Please advise and route to pool B so nurse can phone in to pharmacy

## 2017-01-01 NOTE — Telephone Encounter (Signed)
Rx called to pharmacy

## 2017-01-01 NOTE — Telephone Encounter (Signed)
Changed patients pharmacy in computer per her request

## 2017-01-01 NOTE — Telephone Encounter (Signed)
Please call in ambien with 1 refills 

## 2017-01-02 DIAGNOSIS — Z01419 Encounter for gynecological examination (general) (routine) without abnormal findings: Secondary | ICD-10-CM | POA: Diagnosis not present

## 2017-01-02 DIAGNOSIS — Z6824 Body mass index (BMI) 24.0-24.9, adult: Secondary | ICD-10-CM | POA: Diagnosis not present

## 2017-01-02 DIAGNOSIS — Z975 Presence of (intrauterine) contraceptive device: Secondary | ICD-10-CM | POA: Diagnosis not present

## 2017-01-09 ENCOUNTER — Telehealth: Payer: 59 | Admitting: Nurse Practitioner

## 2017-01-09 DIAGNOSIS — B373 Candidiasis of vulva and vagina: Secondary | ICD-10-CM

## 2017-01-09 DIAGNOSIS — B3731 Acute candidiasis of vulva and vagina: Secondary | ICD-10-CM

## 2017-01-09 MED ORDER — FLUCONAZOLE 150 MG PO TABS: 150.0000 mg | ORAL_TABLET | Freq: Once | ORAL | 0 refills | Status: AC

## 2017-01-09 NOTE — Progress Notes (Signed)

## 2017-01-30 MED FILL — ZOLPIDEM TARTRATE 10 MG TAB: 10 | 30 days supply | Qty: 30 | Fill #1

## 2017-02-06 ENCOUNTER — Encounter: Payer: Self-pay | Admitting: Medical

## 2017-02-06 ENCOUNTER — Ambulatory Visit (INDEPENDENT_AMBULATORY_CARE_PROVIDER_SITE_OTHER): Payer: 59 | Admitting: Medical

## 2017-02-06 VITALS — BP 104/68 | HR 66 | Wt 142.0 lb

## 2017-02-06 DIAGNOSIS — G44229 Chronic tension-type headache, not intractable: Secondary | ICD-10-CM

## 2017-02-06 MED ORDER — KETOROLAC TROMETHAMINE 60 MG/2ML IM SOLN
60.0000 mg | Freq: Once | INTRAMUSCULAR | Status: AC
  Administered 2017-02-06: 60 mg via INTRAMUSCULAR

## 2017-02-06 MED ORDER — PROMETHAZINE HCL 25 MG/ML IJ SOLN
12.5000 mg | Freq: Once | INTRAMUSCULAR | Status: AC
  Administered 2017-02-06: 12.5 mg via INTRAMUSCULAR

## 2017-02-06 MED ORDER — BUTALBITAL-APAP-CAFFEINE 50-325-40 MG PO TABS: 1.0000 | ORAL_TABLET | Freq: Four times a day (QID) | ORAL | 0 refills | Status: DC | PRN

## 2017-02-06 MED FILL — BUTALB-ACETAMIN-CAFF 50-325: 50-325-40 | 5 days supply | Qty: 20 | Fill #0

## 2017-02-06 NOTE — Progress Notes (Signed)
Subjective: Chief Complaint  Patient presents with  . headaches and neck pain    headache on left side x 3-4 days, taking tyleonl hasn't helped    Here for migraine.  Has hx/o migraines, diagnosed 2 years ago with neurology, Dr. Danae OrleansPenumali.  Been having worse headaches in recent weeks.  Has been under more stress of late.  Has had headache all this week for days, usually migraines are right sided behind eye with phonophobia and photophobia, nausea.    She takes Topamax daily and it was recently titrated up.   She uses flexeril for tension, but its not helping.  Has bad headache and nausea today. No numbness, no tingling, no slurred speech, no vision changes although she does have to sit closer to screen on the computer at work of late.    No recent allergy problems, fever, illness.  No other aggravating or relieving factors. No other complaint.  Past Medical History:  Diagnosis Date  . Depression   . Headache    migraines  . Mild preeclampsia    Current Outpatient Prescriptions on File Prior to Visit  Medication Sig Dispense Refill  . cyclobenzaprine (FLEXERIL) 5 MG tablet Take 1 tablet (5 mg total) by mouth 3 (three) times daily as needed for muscle spasms. 60 tablet 1  . fluticasone (FLONASE) 50 MCG/ACT nasal spray Place 2 sprays into both nostrils daily. 16 g 6  . topiramate (TOPAMAX) 50 MG tablet Take 1-2 tablets (50-100 mg total) by mouth 2 (two) times daily. 360 tablet 4  . zolpidem (AMBIEN) 10 MG tablet Take 1 tablet (10 mg total) by mouth at bedtime as needed. for sleep 30 tablet 1  . Diclofenac Potassium (CAMBIA) 50 MG PACK Take 50 mg by mouth as needed (for migraine; max 1 packet per day). (Patient not taking: Reported on 02/06/2017) 9 each 12   No current facility-administered medications on file prior to visit.    ROS as in subjective  Objective: BP 104/68   Pulse 66   Wt 142 lb (64.4 kg)   SpO2 96%   BMI 26.40 kg/m   General appearance: alert, no distress, WD/WN,  HEENT:  normocephalic, sclerae anicteric, PERRLA, EOMi, nares patent, no discharge or erythema, pharynx normal Oral cavity: MMM, no lesions Neck: supple, no lymphadenopathy, no thyromegaly, no masses Heart: RRR, normal S1, S2, no murmurs Lungs: CTA bilaterally, no wheezes, rhonchi, or rales Extremities: no edema, no cyanosis, no clubbing Pulses: 2+ symmetric, upper and lower extremities, normal cap refill Neurological: alert, oriented x 3, CN2-12 intact, strength normal upper extremities and lower extremities, sensation normal throughout, DTRs 2+ throughout, no cerebellar signs, gait normal Psychiatric: normal affect, behavior normal, pleasant    Assessment: Encounter Diagnosis  Name Primary?  . Chronic tension-type headache, not intractable Yes    Plan: Discussed symptoms, exam, hx/o migraines.  Gave 60mg  Toradol and 12.5mg  promethazine today for acute headaches.  script for Fioricet prn if headache doesn't resolve today.   Limit stress and triggers.  hydrate well.  Consider Zomig as alternative abortive therapy.  F/u with neurology as planned.    Erica Rodriguez was seen today for headaches and neck pain.  Diagnoses and all orders for this visit:  Chronic tension-type headache, not intractable -     ketorolac (TORADOL) injection 60 mg; Inject 2 mLs (60 mg total) into the muscle once. -     promethazine (PHENERGAN) injection 12.5 mg; Inject 0.5 mLs (12.5 mg total) into the muscle once.  Other orders -  butalbital-acetaminophen-caffeine (FIORICET, ESGIC) 50-325-40 MG tablet; Take 1-2 tablets by mouth every 6 (six) hours as needed for headache.

## 2017-02-17 ENCOUNTER — Telehealth: Payer: Self-pay | Admitting: Adult Health

## 2017-02-17 NOTE — Telephone Encounter (Signed)
FYI-Patient  has appointment in November with Dr. Marjory LiesPenumalli but needs to be seen sooner for follow up for headaches. I scheduled patient with Megan on 02-19-17.

## 2017-02-18 ENCOUNTER — Telehealth: Payer: 59 | Admitting: Family

## 2017-02-18 DIAGNOSIS — R399 Unspecified symptoms and signs involving the genitourinary system: Secondary | ICD-10-CM | POA: Diagnosis not present

## 2017-02-18 MED ORDER — CEPHALEXIN 500 MG PO CAPS: 500.0000 mg | ORAL_CAPSULE | Freq: Two times a day (BID) | ORAL | 0 refills | Status: DC

## 2017-02-18 MED ORDER — FLUCONAZOLE 150 MG PO TABS: 150.0000 mg | ORAL_TABLET | Freq: Once | ORAL | 0 refills | Status: AC

## 2017-02-18 MED FILL — CEPHALEXIN 500 MG CAPSULE: 500 | 7 days supply | Qty: 14 | Fill #0

## 2017-02-18 MED FILL — FLUCONAZOLE 150 MG TABLET: 150 | 1 days supply | Qty: 1 | Fill #0

## 2017-02-18 NOTE — Progress Notes (Signed)
We are sorry that you are not feeling well.  Here is how we plan to help!  Based on what you shared with me it looks like you most likely have a simple urinary tract infection.  A UTI (Urinary Tract Infection) is a bacterial infection of the bladder.  Most cases of urinary tract infections are simple to treat but a key part of your care is to encourage you to drink plenty of fluids and watch your symptoms carefully.  I have prescribed Keflex 500 mg twice a day for 7 days.  Your symptoms should gradually improve. Call us if the burning in your urine worsens, you develop worsening fever, back pain or pelvic pain or if your symptoms do not resolve after completing the antibiotic.  I have also sent in a diflucan tablet to your pharmacy.    Urinary tract infections can be prevented by drinking plenty of water to keep your body hydrated.  Also be sure when you wipe, wipe from front to back and don't hold it in!  If possible, empty your bladder every 4 hours.  Your e-visit answers were reviewed by a board certified advanced clinical practitioner to complete your personal care plan.  Depending on the condition, your plan could have included both over the counter or prescription medications.  If there is a problem please reply  once you have received a response from your provider.  Your safety is important to us.  If you have drug allergies check your prescription carefully.    You can use MyChart to ask questions about today's visit, request a non-urgent call back, or ask for a work or school excuse for 24 hours related to this e-Visit. If it has been greater than 24 hours you will need to follow up with your provider, or enter a new e-Visit to address those concerns.   You will get an e-mail in the next two days asking about your experience.  I hope that your e-visit has been valuable and will speed your recovery. Thank you for using e-visits.

## 2017-02-19 ENCOUNTER — Encounter: Payer: Self-pay | Admitting: Adult Health

## 2017-02-19 ENCOUNTER — Ambulatory Visit (INDEPENDENT_AMBULATORY_CARE_PROVIDER_SITE_OTHER): Payer: 59 | Admitting: Adult Health

## 2017-02-19 VITALS — BP 129/83 | HR 87 | Ht 61.5 in | Wt 143.0 lb

## 2017-02-19 DIAGNOSIS — G43009 Migraine without aura, not intractable, without status migrainosus: Secondary | ICD-10-CM

## 2017-02-19 MED ORDER — SERTRALINE HCL 25 MG PO TABS: 25.0000 mg | ORAL_TABLET | Freq: Every day | ORAL | 5 refills | Status: DC

## 2017-02-19 MED FILL — SERTRALINE HCL 25 MG TABLET: 25 | 30 days supply | Qty: 30 | Fill #0

## 2017-02-19 NOTE — Progress Notes (Signed)
PATIENT: Erica Rodriguez DOB: Jul 23, 1985  REASON FOR VISIT: follow up HISTORY FROM: patient  HISTORY OF PRESENT ILLNESS: Today 02/19/17 Ms. Erica Rodriguez is a 31 year old female with a history of migraine headaches. She returns today for follow-up. At the last visit Topamax was increased to 100 mg twice a day. She reports starting in June she had a constant headache for approximately 3 weeks. She reports that she continuously has a dull headache. Occasionally this will advance to a severe headache. She reports that her headaches typically occur on the right side. She does have photophobia, phonophobia and nausea. She states that her headaches have continued to be daily throughout the month of July. She reports that she may have had only one headache free day. She is tried amitriptyline in the past however this caused constipation. She is also tried Imitrex without benefit. She has tried over-the-counter medications such as Advil and Aleve. She does report that she does drink caffeine throughout the day. She states that she has tried to cut back but reports that she "loves caffeine." In the past she has had a prednisone Dosepak as well as toradol for her headaches. Both have been beneficial. The patient states that she was recently diagnosed with a urinary tract infection. She started Keflex today. Patient reports that she does have high anxiety and stress levels. She reports that her family also reports that she is "high strung." She returns today for an evaluation.  UPDATE 11/11/16: Since last visit, tried amitriptyline, x 2 months, then stopped due to side effects. Tolerating TPX. Headaches are slightly better. Sleep is better. Less caffeine.   UPDATE 08/12/16: Since last visit, HA have worsened. Lately having more cravings for food and soda (2-3 per day). More anxiety and insomnia issues.   UPDATE 02/05/16: Since last visit having 2-3 migraine per month. Some mild hair loss. Also with right TMJ pain in April  2017, then June 2017. Had tx with PCP (toradol, prednisone, flexeril), and now improved.   UPDATE 08/07/15: Since last visit, HA are improved. Now having 3-4 HA per month. Cambia helping for rescue medication.   UPDATE 04/04/15: Since last visit, has continued with 6-7 mild HA per month; and occ severe right retroorbital severe HA, with nausea, sens to light; some "messed up" vision and spots before some HA. Dr. Cathey EndowBowen ophthalmology eval'd patient, and no sig papilledema noted. MRI brain and labs unremarkable. Tried imitrex per PCP and had hot feeling, anxiety and flushing, with minimal benefit. Still not sleeping well at night, in spite of nightly ambien.   PRIOR HPI (12/13/14): 31 year old right-handed female here for evaluation of headache, eye pain, swollen optic nerves, retro-orbital pain. One and a half months ago patient noticed right-sided headache, with pressure and pulsating pushing sensation behind her right eye. She had some sensitivity to light, nausea, without phonophobia or vomiting. Initially she thought she was pregnant and took a home pregnancy test which was negative. Symptoms have been constant since that time. She has chronic insomnia and has been on Ambien for several years. She has been on minocycline and trentinon cream for past 6 months. Patient went to optometrist for evaluation was found to have mild optic nerve head edema bilaterally.  REVIEW OF SYSTEMS: Out of a complete 14 system review of symptoms, the patient complains only of the following symptoms, and all other reviewed systems are negative.    ALLERGIES: Allergies  Allergen Reactions  . Amitriptyline Other (See Comments)    Constipation, grogginess  HOME MEDICATIONS: Outpatient Medications Prior to Visit  Medication Sig Dispense Refill  . butalbital-acetaminophen-caffeine (FIORICET, ESGIC) 50-325-40 MG tablet Take 1-2 tablets by mouth every 6 (six) hours as needed for headache. 20 tablet 0  . cephALEXin  (KEFLEX) 500 MG capsule Take 1 capsule (500 mg total) by mouth 2 (two) times daily. 14 capsule 0  . fluticasone (FLONASE) 50 MCG/ACT nasal spray Place 2 sprays into both nostrils daily. 16 g 6  . topiramate (TOPAMAX) 50 MG tablet Take 1-2 tablets (50-100 mg total) by mouth 2 (two) times daily. 360 tablet 4  . zolpidem (AMBIEN) 10 MG tablet Take 1 tablet (10 mg total) by mouth at bedtime as needed. for sleep 30 tablet 1  . Diclofenac Potassium (CAMBIA) 50 MG PACK Take 50 mg by mouth as needed (for migraine; max 1 packet per day). (Patient not taking: Reported on 02/06/2017) 9 each 12  . cyclobenzaprine (FLEXERIL) 5 MG tablet Take 1 tablet (5 mg total) by mouth 3 (three) times daily as needed for muscle spasms. 60 tablet 1   No facility-administered medications prior to visit.     PAST MEDICAL HISTORY: Past Medical History:  Diagnosis Date  . Depression   . Headache    migraines  . Mild preeclampsia     PAST SURGICAL HISTORY: Past Surgical History:  Procedure Laterality Date  . CESAREAN SECTION    . FOOT SURGERY Bilateral   . WISDOM TOOTH EXTRACTION      FAMILY HISTORY: Family History  Problem Relation Age of Onset  . Thyroid disease Mother   . Hyperlipidemia Mother   . Arthritis/Rheumatoid Mother   . Diabetes Mother   . Hypertension Father   . Kidney disease Father   . Cirrhosis Father   . Diabetes Maternal Grandmother   . Stroke Maternal Grandmother   . Stroke Maternal Grandfather     SOCIAL HISTORY: Social History   Social History  . Marital status: Married    Spouse name: Casimiro Needle  . Number of children: 1  . Years of education: 33   Occupational History  .      clincal registration   Social History Main Topics  . Smoking status: Never Smoker  . Smokeless tobacco: Never Used  . Alcohol use No  . Drug use: No  . Sexual activity: Yes   Other Topics Concern  . Not on file   Social History Narrative   Lives at home with husband and son   Drinks 1 soda a  day       PHYSICAL EXAM  Vitals:   02/19/17 1035  BP: 129/83  Pulse: 87  Weight: 143 lb (64.9 kg)  Height: 5' 1.5" (1.562 m)   Body mass index is 26.58 kg/m.  Generalized: Well developed, in no acute distress   Neurological examination  Mentation: Alert oriented to time, place, history taking. Follows all commands speech and language fluent Cranial nerve II-XII: Pupils were equal round reactive to light. Extraocular movements were full, visual field were full on confrontational test. Facial sensation and strength were normal. Uvula tongue midline. Head turning and shoulder shrug  were normal and symmetric. Motor: The motor testing reveals 5 over 5 strength of all 4 extremities. Good symmetric motor tone is noted throughout.  Sensory: Sensory testing is intact to soft touch on all 4 extremities. No evidence of extinction is noted.  Coordination: Cerebellar testing reveals good finger-nose-finger and heel-to-shin bilaterally.  Gait and station: Gait is normal. Tandem gait is normal. Romberg is  negative. No drift is seen.  Reflexes: Deep tendon reflexes are symmetric and normal bilaterally.   DIAGNOSTIC DATA (LABS, IMAGING, TESTING) - I reviewed patient records, labs, notes, testing and imaging myself where available.  Lab Results  Component Value Date   WBC 5.9 12/11/2016   HGB 13.5 12/11/2016   HCT 40.3 12/11/2016   MCV 97 12/11/2016   PLT 378 12/11/2016      Component Value Date/Time   NA 143 12/11/2016 0917   K 4.5 12/11/2016 0917   CL 108 (H) 12/11/2016 0917   CO2 20 12/11/2016 0917   GLUCOSE 82 12/11/2016 0917   BUN 11 12/11/2016 0917   CREATININE 0.67 12/11/2016 0917   CALCIUM 9.3 12/11/2016 0917   PROT 7.3 12/11/2016 0917   ALBUMIN 4.6 12/11/2016 0917   AST 14 12/11/2016 0917   ALT 10 12/11/2016 0917   ALKPHOS 83 12/11/2016 0917   BILITOT 0.4 12/11/2016 0917   GFRNONAA 118 12/11/2016 0917   GFRAA 135 12/11/2016 0917    Lab Results  Component Value Date    HGBA1C 5.2 08/12/2016   Lab Results  Component Value Date   VITAMINB12 249 12/11/2016   Lab Results  Component Value Date   TSH 1.830 12/11/2016      ASSESSMENT AND PLAN 31 y.o. year old female  has a past medical history of Depression; Headache; and Mild preeclampsia. here with:  1. Migraine headaches  Patient has had a daily headache since June. Prednisone Dosepak has been beneficial in the past however patient was recently diagnosed with a urinary tract infection.  For that reason we will not give prednisone. Patient is encouraged to stay well-hydrated. She will  Continue Topamax 100 mg twice a day. I will begin Zoloft 25 mg at bedtime for ongoing anxiety. Treating her anxiety may also be beneficial for her headache frequency. We discussed adding gabapentin however we will hold off for now. If she continues to have daily headaches we can consider Botox in the future. Patient is amenable to this plan. She will follow-up in November with Dr. Marjory Lies she will call if her symptoms do not improve.   I spent 15 minutes with the patient. 50% of this time was spent discussing medication options     Butch Penny, MSN, NP-C 02/19/2017, 11:15 AM [facility] [address] [place], [place] [address] [phone]

## 2017-02-19 NOTE — Patient Instructions (Addendum)
Start Zoloft 25 mg at bedtime  Stay well hydrated.  If headaches remain daily we can consider Botox We can also add on gabapentin 100 mg at bedtime for headaches If your symptoms worsen or you develop new symptoms please let us know.   Sertraline tablets What is this medicine? SERTRALINE (SER tra leen) is used to treat depression. It may also be used to treat obsessive compulsive disorder, panic disorder, post-trauma stress, premenstrual dysphoric disorder (PMDD) or social anxiety. This medicine may be used for other purposes; ask your health care provider or pharmacist if you have questions. COMMON BRAND NAME(S): Zoloft What should I tell my health care provider before I take this medicine? They need to know if you have any of these conditions: -bleeding disorders -bipolar disorder or a family history of bipolar disorder -glaucoma -heart disease -high blood pressure -history of irregular heartbeat -history of low levels of calcium, magnesium, or potassium in the blood -if you often drink alcohol -liver disease -receiving electroconvulsive therapy -seizures -suicidal thoughts, plans, or attempt; a previous suicide attempt by you or a family member -take medicines that treat or prevent blood clots -thyroid disease -an unusual or allergic reaction to sertraline, other medicines, foods, dyes, or preservatives -pregnant or trying to get pregnant -breast-feeding How should I use this medicine? Take this medicine by mouth with a glass of water. Follow the directions on the prescription label. You can take it with or without food. Take your medicine at regular intervals. Do not take your medicine more often than directed. Do not stop taking this medicine suddenly except upon the advice of your doctor. Stopping this medicine too quickly may cause serious side effects or your condition may worsen. A special MedGuide will be given to you by the pharmacist with each prescription and refill. Be  sure to read this information carefully each time. Talk to your pediatrician regarding the use of this medicine in children. While this drug may be prescribed for children as young as 7 years for selected conditions, precautions do apply. Overdosage: If you think you have taken too much of this medicine contact a poison control center or emergency room at once. NOTE: This medicine is only for you. Do not share this medicine with others. What if I miss a dose? If you miss a dose, take it as soon as you can. If it is almost time for your next dose, take only that dose. Do not take double or extra doses. What may interact with this medicine? Do not take this medicine with any of the following medications: -cisapride -dofetilide -dronedarone -linezolid -MAOIs like Carbex, Eldepryl, Marplan, Nardil, and Parnate -methylene blue (injected into a vein) -pimozide -thioridazine This medicine may also interact with the following medications: -alcohol -amphetamines -aspirin and aspirin-like medicines -certain medicines for depression, anxiety, or psychotic disturbances -certain medicines for fungal infections like ketoconazole, fluconazole, posaconazole, and itraconazole -certain medicines for irregular heart beat like flecainide, quinidine, propafenone -certain medicines for migraine headaches like almotriptan, eletriptan, frovatriptan, naratriptan, rizatriptan, sumatriptan, zolmitriptan -certain medicines for sleep -certain medicines for seizures like carbamazepine, valproic acid, phenytoin -certain medicines that treat or prevent blood clots like warfarin, enoxaparin, dalteparin -cimetidine -digoxin -diuretics -fentanyl -isoniazid -lithium -NSAIDs, medicines for pain and inflammation, like ibuprofen or naproxen -other medicines that prolong the QT interval (cause an abnormal heart rhythm) -rasagiline -safinamide -supplements like St. John's wort, kava kava,  valerian -tolbutamide -tramadol -tryptophan This list may not describe all possible interactions. Give your health care provider a list  of all the medicines, herbs, non-prescription drugs, or dietary supplements you use. Also tell them if you smoke, drink alcohol, or use illegal drugs. Some items may interact with your medicine. What should I watch for while using this medicine? Tell your doctor if your symptoms do not get better or if they get worse. Visit your doctor or health care professional for regular checks on your progress. Because it may take several weeks to see the full effects of this medicine, it is important to continue your treatment as prescribed by your doctor. Patients and their families should watch out for new or worsening thoughts of suicide or depression. Also watch out for sudden changes in feelings such as feeling anxious, agitated, panicky, irritable, hostile, aggressive, impulsive, severely restless, overly excited and hyperactive, or not being able to sleep. If this happens, especially at the beginning of treatment or after a change in dose, call your health care professional. Bonita Quin may get drowsy or dizzy. Do not drive, use machinery, or do anything that needs mental alertness until you know how this medicine affects you. Do not stand or sit up quickly, especially if you are an older patient. This reduces the risk of dizzy or fainting spells. Alcohol may interfere with the effect of this medicine. Avoid alcoholic drinks. Your mouth may get dry. Chewing sugarless gum or sucking hard candy, and drinking plenty of water may help. Contact your doctor if the problem does not go away or is severe. What side effects may I notice from receiving this medicine? Side effects that you should report to your doctor or health care professional as soon as possible: -allergic reactions like skin rash, itching or hives, swelling of the face, lips, or tongue -anxious -black, tarry  stools -changes in vision -confusion -elevated mood, decreased need for sleep, racing thoughts, impulsive behavior -eye pain -fast, irregular heartbeat -feeling faint or lightheaded, falls -feeling agitated, angry, or irritable -hallucination, loss of contact with reality -loss of balance or coordination -loss of memory -painful or prolonged erections -restlessness, pacing, inability to keep still -seizures -stiff muscles -suicidal thoughts or other mood changes -trouble sleeping -unusual bleeding or bruising -unusually weak or tired -vomiting Side effects that usually do not require medical attention (report to your doctor or health care professional if they continue or are bothersome): -change in appetite or weight -change in sex drive or performance -diarrhea -increased sweating -indigestion, nausea -tremors This list may not describe all possible side effects. Call your doctor for medical advice about side effects. You may report side effects to FDA at 1-800-FDA-1088. Where should I keep my medicine? Keep out of the reach of children. Store at room temperature between 15 and 30 degrees C (59 and 86 degrees F). Throw away any unused medicine after the expiration date. NOTE: This sheet is a summary. It may not cover all possible information. If you have questions about this medicine, talk to your doctor, pharmacist, or health care provider.  2018 Elsevier/Gold Standard (2016-06-27 14:17:49)  Gabapentin capsules or tablets What is this medicine? GABAPENTIN (GA ba pen tin) is used to control partial seizures in adults with epilepsy. It is also used to treat certain types of nerve pain. This medicine may be used for other purposes; ask your health care provider or pharmacist if you have questions. COMMON BRAND NAME(S): Active-PAC with Gabapentin, Gabarone, Neurontin What should I tell my health care provider before I take this medicine? They need to know if you have any of  these conditions: -kidney  disease -suicidal thoughts, plans, or attempt; a previous suicide attempt by you or a family member -an unusual or allergic reaction to gabapentin, other medicines, foods, dyes, or preservatives -pregnant or trying to get pregnant -breast-feeding How should I use this medicine? Take this medicine by mouth with a glass of water. Follow the directions on the prescription label. You can take it with or without food. If it upsets your stomach, take it with food.Take your medicine at regular intervals. Do not take it more often than directed. Do not stop taking except on your doctor's advice. If you are directed to break the 600 or 800 mg tablets in half as part of your dose, the extra half tablet should be used for the next dose. If you have not used the extra half tablet within 28 days, it should be thrown away. A special MedGuide will be given to you by the pharmacist with each prescription and refill. Be sure to read this information carefully each time. Talk to your pediatrician regarding the use of this medicine in children. Special care may be needed. Overdosage: If you think you have taken too much of this medicine contact a poison control center or emergency room at once. NOTE: This medicine is only for you. Do not share this medicine with others. What if I miss a dose? If you miss a dose, take it as soon as you can. If it is almost time for your next dose, take only that dose. Do not take double or extra doses. What may interact with this medicine? Do not take this medicine with any of the following medications: -other gabapentin products This medicine may also interact with the following medications: -alcohol -antacids -antihistamines for allergy, cough and cold -certain medicines for anxiety or sleep -certain medicines for depression or psychotic disturbances -homatropine; hydrocodone -naproxen -narcotic medicines (opiates) for pain -phenothiazines like  chlorpromazine, mesoridazine, prochlorperazine, thioridazine This list may not describe all possible interactions. Give your health care provider a list of all the medicines, herbs, non-prescription drugs, or dietary supplements you use. Also tell them if you smoke, drink alcohol, or use illegal drugs. Some items may interact with your medicine. What should I watch for while using this medicine? Visit your doctor or health care professional for regular checks on your progress. You may want to keep a record at home of how you feel your condition is responding to treatment. You may want to share this information with your doctor or health care professional at each visit. You should contact your doctor or health care professional if your seizures get worse or if you have any new types of seizures. Do not stop taking this medicine or any of your seizure medicines unless instructed by your doctor or health care professional. Stopping your medicine suddenly can increase your seizures or their severity. Wear a medical identification bracelet or chain if you are taking this medicine for seizures, and carry a card that lists all your medications. You may get drowsy, dizzy, or have blurred vision. Do not drive, use machinery, or do anything that needs mental alertness until you know how this medicine affects you. To reduce dizzy or fainting spells, do not sit or stand up quickly, especially if you are an older patient. Alcohol can increase drowsiness and dizziness. Avoid alcoholic drinks. Your mouth may get dry. Chewing sugarless gum or sucking hard candy, and drinking plenty of water will help. The use of this medicine may increase the chance of suicidal thoughts or actions.  Pay special attention to how you are responding while on this medicine. Any worsening of mood, or thoughts of suicide or dying should be reported to your health care professional right away. Women who become pregnant while using this medicine may  enroll in the [place] American Antiepileptic Drug Pregnancy Registry by calling 413-429-4172. This registry collects information about the safety of antiepileptic drug use during pregnancy. What side effects may I notice from receiving this medicine? Side effects that you should report to your doctor or health care professional as soon as possible: -allergic reactions like skin rash, itching or hives, swelling of the face, lips, or tongue -worsening of mood, thoughts or actions of suicide or dying Side effects that usually do not require medical attention (report to your doctor or health care professional if they continue or are bothersome): -constipation -difficulty walking or controlling muscle movements -dizziness -nausea -slurred speech -tiredness -tremors -weight gain This list may not describe all possible side effects. Call your doctor for medical advice about side effects. You may report side effects to FDA at 1-800-FDA-1088. Where should I keep my medicine? Keep out of reach of children. This medicine may cause accidental overdose and death if it taken by other adults, children, or pets. Mix any unused medicine with a substance like cat litter or coffee grounds. Then throw the medicine away in a sealed container like a sealed bag or a coffee can with a lid. Do not use the medicine after the expiration date. Store at room temperature between 15 and 30 degrees C (59 and 86 degrees F). NOTE: This sheet is a summary. It may not cover all possible information. If you have questions about this medicine, talk to your doctor, pharmacist, or health care provider.  2018 Elsevier/Gold Standard (2013-08-19 15:26:50)

## 2017-02-23 NOTE — Progress Notes (Signed)
I reviewed note and agree with plan.   Suanne Marker, MD 02/23/2017, 6:50 PM Certified in Neurology, Neurophysiology and Neuroimaging  [facility] [address], [address] [place], [place] [address] [phone]

## 2017-03-02 MED FILL — TOPIRAMATE 50 MG TABLET: 50 | 90 days supply | Qty: 360 | Fill #1

## 2017-03-12 ENCOUNTER — Encounter: Payer: Self-pay | Admitting: Nurse Practitioner

## 2017-03-13 ENCOUNTER — Other Ambulatory Visit: Payer: Self-pay | Admitting: Nurse Practitioner

## 2017-03-13 DIAGNOSIS — G4709 Other insomnia: Secondary | ICD-10-CM

## 2017-03-13 MED ORDER — ZOLPIDEM TARTRATE 10 MG PO TABS: 10.0000 mg | ORAL_TABLET | Freq: Every evening | ORAL | 1 refills | Status: DC | PRN

## 2017-03-13 MED FILL — ZOLPIDEM TARTRATE 10 MG TAB: 10 | 30 days supply | Qty: 30 | Fill #0

## 2017-03-13 NOTE — Progress Notes (Signed)
Called to  [facility]

## 2017-03-13 NOTE — Progress Notes (Unsigned)
Please call in ambien 10 mg  1 po qhs #30 with 1 refills 

## 2017-03-21 ENCOUNTER — Telehealth: Payer: 59 | Admitting: Nurse Practitioner

## 2017-03-21 DIAGNOSIS — B373 Candidiasis of vulva and vagina: Secondary | ICD-10-CM

## 2017-03-21 DIAGNOSIS — B3731 Acute candidiasis of vulva and vagina: Secondary | ICD-10-CM

## 2017-03-21 MED ORDER — FLUCONAZOLE 150 MG PO TABS: 150.0000 mg | ORAL_TABLET | Freq: Once | ORAL | 0 refills | Status: AC

## 2017-03-21 NOTE — Progress Notes (Signed)

## 2017-03-25 ENCOUNTER — Encounter: Payer: Self-pay | Admitting: Adult Health

## 2017-03-26 MED ORDER — GABAPENTIN 100 MG PO CAPS: 100.0000 mg | ORAL_CAPSULE | Freq: Every day | ORAL | 5 refills | Status: DC

## 2017-03-27 MED FILL — GABAPENTIN 100 MG CAP: 100 | 30 days supply | Qty: 30 | Fill #0

## 2017-04-06 ENCOUNTER — Encounter: Payer: Self-pay | Admitting: Adult Health

## 2017-04-07 MED ORDER — PREDNISONE 5 MG PO TABS: ORAL_TABLET | ORAL | 0 refills | Status: DC

## 2017-04-07 MED ORDER — GABAPENTIN 100 MG PO CAPS: 200.0000 mg | ORAL_CAPSULE | Freq: Every day | ORAL | 5 refills | Status: DC

## 2017-04-07 MED FILL — predniSONE 5 MG TABS: 5 | 6 days supply | Qty: 21 | Fill #0

## 2017-04-07 NOTE — Telephone Encounter (Signed)
I called the patient. States that she has had a daily headache since last week. Tends to wake up with the headaches and gets worse as the day goes on. On the right side behind the eye. She states that she has been taking the gabapentin 100 mg at bedtime. She has tried [place] when temporary relief. She is also been taking over-the-counter ibuprofen and Tylenol. The patient no longer has a urinary tract infection. She is not taking any antibiotics. I will send her in a prednisone Dosepak. Patient denies being pregnant. If this does not break her headache cycle she should let us know.

## 2017-05-01 MED FILL — GABAPENTIN 100 MG CAP: 100 | 30 days supply | Qty: 60 | Fill #0

## 2017-05-02 ENCOUNTER — Encounter: Payer: Self-pay | Admitting: Family

## 2017-05-02 ENCOUNTER — Ambulatory Visit (INDEPENDENT_AMBULATORY_CARE_PROVIDER_SITE_OTHER): Payer: 59 | Admitting: Family

## 2017-05-02 DIAGNOSIS — G43019 Migraine without aura, intractable, without status migrainosus: Secondary | ICD-10-CM

## 2017-05-02 DIAGNOSIS — H5213 Myopia, bilateral: Secondary | ICD-10-CM | POA: Diagnosis not present

## 2017-05-02 DIAGNOSIS — G43909 Migraine, unspecified, not intractable, without status migrainosus: Secondary | ICD-10-CM

## 2017-05-02 MED ORDER — KETOROLAC TROMETHAMINE 60 MG/2ML IM SOLN
60.0000 mg | Freq: Once | INTRAMUSCULAR | Status: AC
  Administered 2017-05-02: 60 mg via INTRAMUSCULAR

## 2017-05-02 MED ORDER — BUTALBITAL-APAP-CAFFEINE 50-325-40 MG PO TABS: 1.0000 | ORAL_TABLET | Freq: Four times a day (QID) | ORAL | 0 refills | Status: DC | PRN

## 2017-05-02 NOTE — Patient Instructions (Signed)
Migraine Headache A migraine headache is an intense, throbbing pain on one side or both sides of the head. Migraines may also cause other symptoms, such as nausea, vomiting, and sensitivity to light and noise. What are the causes? Doing or taking certain things may also trigger migraines, such as:  Alcohol.  Smoking.  Medicines, such as: ? Medicine used to treat chest pain (nitroglycerine). ? Birth control pills. ? Estrogen pills. ? Certain blood pressure medicines.  Aged cheeses, chocolate, or caffeine.  Foods or drinks that contain nitrates, glutamate, aspartame, or tyramine.  Physical activity.  Other things that may trigger a migraine include:  Menstruation.  Pregnancy.  Hunger.  Stress, lack of sleep, too much sleep, or fatigue.  Weather changes.  What increases the risk? The following factors may make you more likely to experience migraine headaches:  Age. Risk increases with age.  Family history of migraine headaches.  Being Caucasian.  Depression and anxiety.  Obesity.  Being a woman.  Having a hole in the heart (patent foramen ovale) or other heart problems.  What are the signs or symptoms? The main symptom of this condition is pulsating or throbbing pain. Pain may:  Happen in any area of the head, such as on one side or both sides.  Interfere with daily activities.  Get worse with physical activity.  Get worse with exposure to bright lights or loud noises.  Other symptoms may include:  Nausea.  Vomiting.  Dizziness.  General sensitivity to bright lights, loud noises, or smells.  Before you get a migraine, you may get warning signs that a migraine is developing (aura). An aura may include:  Seeing flashing lights or having blind spots.  Seeing bright spots, halos, or zigzag lines.  Having tunnel vision or blurred vision.  Having numbness or a tingling feeling.  Having trouble talking.  Having muscle weakness.  How is this  diagnosed? A migraine headache can be diagnosed based on:  Your symptoms.  A physical exam.  Tests, such as CT scan or MRI of the head. These imaging tests can help rule out other causes of headaches.  Taking fluid from the spine (lumbar puncture) and analyzing it (cerebrospinal fluid analysis, or CSF analysis).  How is this treated? A migraine headache is usually treated with medicines that:  Relieve pain.  Relieve nausea.  Prevent migraines from coming back.  Treatment may also include:  Acupuncture.  Lifestyle changes like avoiding foods that trigger migraines.  Follow these instructions at home: Medicines  Take over-the-counter and prescription medicines only as told by your health care provider.  Do not drive or use heavy machinery while taking prescription pain medicine.  To prevent or treat constipation while you are taking prescription pain medicine, your health care provider may recommend that you: ? Drink enough fluid to keep your urine clear or pale yellow. ? Take over-the-counter or prescription medicines. ? Eat foods that are high in fiber, such as fresh fruits and vegetables, whole grains, and beans. ? Limit foods that are high in fat and processed sugars, such as fried and sweet foods. Lifestyle  Avoid alcohol use.  Do not use any products that contain nicotine or tobacco, such as cigarettes and e-cigarettes. If you need help quitting, ask your health care provider.  Get at least 8 hours of sleep every night.  Limit your stress. General instructions   Keep a journal to find out what may trigger your migraine headaches. For example, write down: ? What you eat and   drink. ? How much sleep you get. ? Any change to your diet or medicines.  If you have a migraine: ? Avoid things that make your symptoms worse, such as bright lights. ? It may help to lie down in a dark, quiet room. ? Do not drive or use heavy machinery. ? Ask your health care provider  what activities are safe for you while you are experiencing symptoms.  Keep all follow-up visits as told by your health care provider. This is important. Contact a health care provider if:  You develop symptoms that are different or more severe than your usual migraine symptoms. Get help right away if:  Your migraine becomes severe.  You have a fever.  You have a stiff neck.  You have vision loss.  Your muscles feel weak or like you cannot control them.  You start to lose your balance often.  You develop trouble walking.  You faint. This information is not intended to replace advice given to you by your health care provider. Make sure you discuss any questions you have with your health care provider. Document Released: 06/23/2005 Document Revised: 01/11/2016 Document Reviewed: 12/10/2015 Elsevier Interactive Patient Education  2017 [employer]   

## 2017-05-02 NOTE — Progress Notes (Signed)
Subjective:    Patient ID: Erica Rodriguez, female    DOB: 04-Sep-1985, 31 y.o.   MRN: [id number]  Pt presents to the office today with a migraine. Pt is followed on a Neurologists every 6 months. Pt taking Topamax and Gabapentin daily and then Cambia  as needed. Has follow up appt 11/16. Headache   This is a new problem. The current episode started in the past 7 days. The problem occurs constantly. The problem has been unchanged. The pain is located in the right unilateral region. The pain radiates to the right neck. The pain quality is similar to prior headaches. The quality of the pain is described as aching. The pain is at a severity of 10/10. The pain is moderate. Associated symptoms include nausea, phonophobia and photophobia. Pertinent negatives include no blurred vision, sinus pressure, sore throat or vomiting. The symptoms are aggravated by bright light and weather changes. She has tried NSAIDs, Excedrin and triptans for the symptoms. The treatment provided mild relief. Her past medical history is significant for migraine headaches.     Review of Systems  HENT: Negative for sinus pressure and sore throat.   Eyes: Positive for photophobia. Negative for blurred vision.  Gastrointestinal: Positive for nausea. Negative for vomiting.  Neurological: Positive for headaches.  All other systems reviewed and are negative.      Objective:   Physical Exam  Constitutional: She is oriented to person, place, and time. She appears well-developed and well-nourished. No distress.  HENT:  Head: Normocephalic and atraumatic.  Right Ear: External ear normal.  Left Ear: External ear normal.  Nose: Nose normal.  Mouth/Throat: Oropharynx is clear and moist.  Eyes: Pupils are equal, round, and reactive to light.  Neck: Normal range of motion. Neck supple. No thyromegaly present.  Cardiovascular: Normal rate, regular rhythm, normal heart sounds and intact distal pulses.   No murmur  heard. Pulmonary/Chest: Effort normal and breath sounds normal. No respiratory distress. She has no wheezes.  Abdominal: Soft. Bowel sounds are normal. She exhibits no distension. There is no tenderness.  Musculoskeletal: Normal range of motion. She exhibits no edema or tenderness.  Neurological: She is alert and oriented to person, place, and time.  Skin: Skin is warm and dry.  Psychiatric: She has a normal mood and affect. Her behavior is normal. Judgment and thought content normal.  Vitals reviewed.    BP 94/69   Pulse 86   Temp (!) 97.3 F (36.3 C) (Oral)   Ht 5' 1.5" (1.562 m)   Wt 141 lb (64 kg)   BMI 26.21 kg/m      Assessment & Plan:  1. Intractable migraine without aura and without status migrainosus Limit stress Avoid caffeine Encourage rest Keep Neurologists appt RTO prn or if symptoms worse, If headache worsens go to ED - ketorolac (TORADOL) injection 60 mg; Inject 2 mLs (60 mg total) into the muscle once.    Jannifer Rodney, FNP

## 2017-05-03 ENCOUNTER — Telehealth: Payer: 59 | Admitting: Family

## 2017-05-03 DIAGNOSIS — J019 Acute sinusitis, unspecified: Secondary | ICD-10-CM | POA: Diagnosis not present

## 2017-05-03 MED ORDER — AMOXICILLIN-POT CLAVULANATE 875-125 MG PO TABS: 1.0000 | ORAL_TABLET | Freq: Two times a day (BID) | ORAL | 0 refills | Status: DC

## 2017-05-03 MED ORDER — FLUCONAZOLE 150 MG PO TABS: 150.0000 mg | ORAL_TABLET | Freq: Once | ORAL | 0 refills | Status: AC

## 2017-05-03 NOTE — Progress Notes (Signed)
We are sorry that you are not feeling well.  Here is how we plan to help!  Based on what you have shared with me it looks like you have sinusitis.  Sinusitis is inflammation and infection in the sinus cavities of the head.  Based on your presentation I believe you most likely have Acute Bacterial Sinusitis.  This is an infection caused by bacteria and is treated with antibiotics. I have prescribed Augmentin 875mg /125mg  one tablet twice daily with food, for 7 days. You may use an oral decongestant such as Mucinex D or if you have glaucoma or high blood pressure use plain Mucinex. Saline nasal spray help and can safely be used as often as needed for congestion.  If you develop worsening sinus pain, fever or notice severe headache and vision changes, or if symptoms are not better after completion of antibiotic, please schedule an appointment with a health care provider.    I have also sent in a diflucan prescription.    Sinus infections are not as easily transmitted as other respiratory infection, however we still recommend that you avoid close contact with loved ones, especially the very young and elderly.  Remember to wash your hands thoroughly throughout the day as this is the number one way to prevent the spread of infection!  Home Care:  Only take medications as instructed by your medical team.  Complete the entire course of an antibiotic.  Do not take these medications with alcohol.  A steam or ultrasonic humidifier can help congestion.  You can place a towel over your head and breathe in the steam from hot water coming from a faucet.  Avoid close contacts especially the very young and the elderly.  Cover your mouth when you cough or sneeze.  Always remember to wash your hands.  Get Help Right Away If:  You develop worsening fever or sinus pain.  You develop a severe head ache or visual changes.  Your symptoms persist after you have completed your treatment plan.  Make sure  you  Understand these instructions.  Will watch your condition.  Will get help right away if you are not doing well or get worse.  Your e-visit answers were reviewed by a board certified advanced clinical practitioner to complete your personal care plan.  Depending on the condition, your plan could have included both over the counter or prescription medications.  If there is a problem please reply  once you have received a response from your provider.  Your safety is important to us.  If you have drug allergies check your prescription carefully.    You can use MyChart to ask questions about today's visit, request a non-urgent call back, or ask for a work or school excuse for 24 hours related to this e-Visit. If it has been greater than 24 hours you will need to follow up with your provider, or enter a new e-Visit to address those concerns.  You will get an e-mail in the next two days asking about your experience.  I hope that your e-visit has been valuable and will speed your recovery. Thank you for using e-visits.

## 2017-05-04 MED FILL — AMOX-CLAV 875-125 MG TABLET: 875-125 | 7 days supply | Qty: 14 | Fill #0

## 2017-05-04 MED FILL — FLUCONAZOLE 150 MG TABLET: 150 | 1 days supply | Qty: 1 | Fill #0

## 2017-05-04 MED FILL — BUTALB-ACETAMIN-CAFF 50-325: 50-325-40 | 3 days supply | Qty: 20 | Fill #0

## 2017-05-06 ENCOUNTER — Ambulatory Visit (INDEPENDENT_AMBULATORY_CARE_PROVIDER_SITE_OTHER): Payer: 59 | Admitting: Family Medicine

## 2017-05-06 ENCOUNTER — Encounter: Payer: Self-pay | Admitting: Family Medicine

## 2017-05-06 VITALS — BP 102/69 | HR 88 | Temp 98.2°F | Ht 61.5 in | Wt 138.0 lb

## 2017-05-06 DIAGNOSIS — J0100 Acute maxillary sinusitis, unspecified: Secondary | ICD-10-CM

## 2017-05-06 MED ORDER — BETAMETHASONE SOD PHOS & ACET 6 (3-3) MG/ML IJ SUSP: 6.0000 mg | Freq: Once | INTRAMUSCULAR | Status: DC

## 2017-05-06 MED ORDER — PSEUDOEPHEDRINE-GUAIFENESIN ER 120-1200 MG PO TB12: ORAL_TABLET | ORAL | 0 refills | Status: DC

## 2017-05-06 MED ORDER — PREDNISONE 10 MG (21) PO TBPK: ORAL_TABLET | Freq: Every day | ORAL | 0 refills | Status: DC

## 2017-05-06 NOTE — Progress Notes (Signed)
Chief Complaint  Patient presents with  . Headache    pt here today c/o headach, pressure on her teeth and behind her eyes and she feels terrible and hurts bad. She was seen Saturday and given Toradol and then given an antibiotic thinking it might be sinus infection which she has been taking since Monday and it's not getting any better.    HPI  Patient presents today for 4 days with increasing headache pain. The pain is primarily on the right side of her face over the cheek and radiating back to the preauricular area. It is 8/10. It is a pressure. She's had no drainage or congestion noted. She was concerned it might be her eyes so she went to her eye doctor and was told everything was fine 4 days ago. She was given a Toradol shot for migraine 4 days ago as well. That just did not help. 2 days ago she called back and was given an antibiotic, Augmentin. At this point the symptoms have not improved at all. No fever chills or sweats have been noted. She says that her teeth hurt particularly the upper teeth.  PMH: Smoking status noted ROS: Per HPI  Objective: BP 102/69   Pulse 88   Temp 98.2 F (36.8 C) (Oral)   Ht 5' 1.5" (1.562 m)   Wt 138 lb (62.6 kg)   BMI 25.65 kg/m  Gen: NAD, alert, cooperative with exam HEENT: NCAT, EOMI, PERRL. Nasal passages are swollen and erythematous. The right maxillary sinus is exquisitely tender. The TMs are clear to inspection CV: RRR, good S1/S2, no murmur Resp: CTABL, no wheezes, non-labored  Ext: No edema, warm Neuro: Alert and oriented, No gross deficits  Assessment and plan:  1. Acute maxillary sinusitis, recurrence not specified     Meds ordered this encounter  Medications  . MIRENA, 52 MG, 20 MCG/24HR IUD  . betamethasone acetate-betamethasone sodium phosphate (CELESTONE) injection 6 mg  . predniSONE (STERAPRED UNI-PAK 21 TAB) 10 MG (21) TBPK tablet    Sig: Take by mouth daily. As directed x 6 days    Dispense:  21 tablet    Refill:  0  .  Pseudoephedrine-Guaifenesin (MUCINEX D MAX STRENGTH) [id number] MG TB12    Sig: Take 1 PO twice daily    Dispense:  20 each    Refill:  0      Follow up as needed.  Mechele ClaudeWarren Lebert Lovern, MD

## 2017-05-18 MED FILL — ZOLPIDEM TARTRATE 10 MG TAB: 10 | 30 days supply | Qty: 30 | Fill #1

## 2017-05-21 DIAGNOSIS — R2231 Localized swelling, mass and lump, right upper limb: Secondary | ICD-10-CM | POA: Diagnosis not present

## 2017-05-21 DIAGNOSIS — Z3202 Encounter for pregnancy test, result negative: Secondary | ICD-10-CM | POA: Diagnosis not present

## 2017-05-21 DIAGNOSIS — Z30014 Encounter for initial prescription of intrauterine contraceptive device: Secondary | ICD-10-CM | POA: Diagnosis not present

## 2017-05-21 DIAGNOSIS — Z30433 Encounter for removal and reinsertion of intrauterine contraceptive device: Secondary | ICD-10-CM | POA: Diagnosis not present

## 2017-05-22 ENCOUNTER — Encounter: Payer: Self-pay | Admitting: Diagnostic Neuroimaging

## 2017-05-22 ENCOUNTER — Ambulatory Visit: Payer: 59 | Admitting: Diagnostic Neuroimaging

## 2017-05-22 VITALS — BP 128/84 | HR 85 | Ht 61.5 in | Wt 141.6 lb

## 2017-05-22 DIAGNOSIS — G43009 Migraine without aura, not intractable, without status migrainosus: Secondary | ICD-10-CM | POA: Diagnosis not present

## 2017-05-22 DIAGNOSIS — G44209 Tension-type headache, unspecified, not intractable: Secondary | ICD-10-CM

## 2017-05-22 MED ORDER — TOPIRAMATE 100 MG PO TABS: 100.0000 mg | ORAL_TABLET | Freq: Two times a day (BID) | ORAL | 4 refills | Status: DC

## 2017-05-22 MED ORDER — RIZATRIPTAN BENZOATE 10 MG PO TBDP: 10.0000 mg | ORAL_TABLET | ORAL | 11 refills | Status: DC | PRN

## 2017-05-22 MED FILL — RIZATRIPTAN 10 MG ODT: 10 | 30 days supply | Qty: 12 | Fill #0

## 2017-05-22 MED FILL — TOPIRAMATE 100 MG TABLET: 100 | 90 days supply | Qty: 180 | Fill #0

## 2017-05-22 NOTE — Patient Instructions (Signed)
  MIGRAINE PREVENTION - continue topiramate 100mg  twice a day; for migraine prevention - stop gabapentin (not effective) - try ajovy or emgality for migraine prevention - may consider botox in future  MIGRAINE RESCUE - ibuprofen, tylenol - cambia as needed  - rizatriptan as needed  INSOMNIA / ANXIETY - consider psychiatry / psychology follow up

## 2017-05-22 NOTE — Progress Notes (Signed)
[facility]  PATIENT: Erica Rodriguez DOB: [birth year]  REFERRING CLINICIAN:  HISTORY FROM: patient  REASON FOR VISIT: follow up   HISTORICAL  CHIEF COMPLAINT:  Chief Complaint  Patient presents with  . Follow-up  . Migraine    no change, has calendar, no migraine today.    HISTORY OF PRESENT ILLNESS:   UPDATE (05/22/17, VRP): Since last visit, doing about the same; averaging 15+ days headache per month. Tolerating meds. No alleviating or aggravating factors.   UPDATE 02/19/17 (MM): "Ms. Erica Rodriguez is a 31 year old female with a history of migraine headaches. She returns today for follow-up. At the last visit Topamax was increased to 100 mg twice a day. She reports starting in June she had a constant headache for approximately 3 weeks. She reports that she continuously has a dull headache. Occasionally this will advance to a severe headache. She reports that her headaches typically occur on the right side. She does have photophobia, phonophobia and nausea. She states that her headaches have continued to be daily throughout the month of July. She reports that she may have had only one headache free day. She is tried amitriptyline in the past however this caused constipation. She is also tried Imitrex without benefit. She has tried over-the-counter medications such as Advil and Aleve. She does report that she does drink caffeine throughout the day. She states that she has tried to cut back but reports that she "loves caffeine." In the past she has had a prednisone Dosepak as well as toradol for her headaches. Both have been beneficial. The patient states that she was recently diagnosed with a urinary tract infection. She started Keflex today. Patient reports that she does have high anxiety and stress levels. She reports that her family also reports that she is "high strung." She returns today for an evaluation."  UPDATE 11/11/16: Since last visit, tried amitriptyline, x 2 months, then  stopped due to side effects. Tolerating TPX. Headaches are slightly better. Sleep is better. Less caffeine.   UPDATE 08/12/16: Since last visit, HA have worsened. Lately having more cravings for food and soda (2-3 per day). More anxiety and insomnia issues.   UPDATE 02/05/16: Since last visit having 2-3 migraine per month. Some mild hair loss. Also with right TMJ pain in April 2017, then June 2017. Had tx with PCP (toradol, prednisone, flexeril), and now improved.   UPDATE 08/07/15: Since last visit, HA are improved. Now having 3-4 HA per month. Cambia helping for rescue medication.   UPDATE 04/04/15: Since last visit, has continued with 6-7 mild HA per month; and occ severe right retroorbital severe HA, with nausea, sens to light; some "messed up" vision and spots before some HA. Dr. Cathey Endow ophthalmology eval'd patient, and no sig papilledema noted. MRI brain and labs unremarkable. Tried imitrex per PCP and had hot feeling, anxiety and flushing, with minimal benefit. Still not sleeping well at night, in spite of nightly ambien.   PRIOR HPI (12/13/14): 31 year old right-handed female here for evaluation of headache, eye pain, swollen optic nerves, retro-orbital pain. One and a half months ago patient noticed right-sided headache, with pressure and pulsating pushing sensation behind her right eye. She had some sensitivity to light, nausea, without phonophobia or vomiting. Initially she thought she was pregnant and took a home pregnancy test which was negative. Symptoms have been constant since that time. She has chronic insomnia and has been on Ambien for several years. She has been on minocycline and trentinon cream for past  6 months. Patient went to optometrist for evaluation was found to have mild optic nerve head edema bilaterally.   REVIEW OF SYSTEMS: Full 14 system review of systems performed and negative except: headache insomnia.   ALLERGIES: Allergies  Allergen Reactions  . Amitriptyline Other (See  Comments)    Constipation, grogginess    HOME MEDICATIONS: Outpatient Medications Prior to Visit  Medication Sig Dispense Refill  . acetaminophen (TYLENOL) 500 MG tablet Take 500 mg by mouth every 6 (six) hours as needed.    . butalbital-acetaminophen-caffeine (FIORICET, ESGIC) 50-325-40 MG tablet Take 1-2 tablets by mouth every 6 (six) hours as needed for headache. 20 tablet 0  . Diclofenac Potassium (CAMBIA) 50 MG PACK Take 50 mg by mouth as needed (for migraine; max 1 packet per day). 9 each 12  . fluticasone (FLONASE) 50 MCG/ACT nasal spray Place 2 sprays into both nostrils daily. 16 g 6  . gabapentin (NEURONTIN) 100 MG capsule Take 2 capsules (200 mg total) by mouth at bedtime. 60 capsule 5  . MIRENA, 52 MG, 20 MCG/24HR IUD     . topiramate (TOPAMAX) 50 MG tablet Take 1-2 tablets (50-100 mg total) by mouth 2 (two) times daily. 360 tablet 4  . zolpidem (AMBIEN) 10 MG tablet Take 1 tablet (10 mg total) by mouth at bedtime as needed. for sleep 30 tablet 1  . amoxicillin-clavulanate (AUGMENTIN) 875-125 MG tablet Take 1 tablet by mouth 2 (two) times daily. (Patient not taking: Reported on 05/22/2017) 14 tablet 0  . predniSONE (STERAPRED UNI-PAK 21 TAB) 10 MG (21) TBPK tablet Take by mouth daily. As directed x 6 days 21 tablet 0  . Pseudoephedrine-Guaifenesin (MUCINEX D MAX STRENGTH) [id number] MG TB12 Take 1 PO twice daily 20 each 0   Facility-Administered Medications Prior to Visit  Medication Dose Route Frequency Provider Last Rate Last Dose  . betamethasone acetate-betamethasone sodium phosphate (CELESTONE) injection 6 mg  6 mg Intramuscular Once Mechele ClaudeStacks, Warren, MD        PAST MEDICAL HISTORY: Past Medical History:  Diagnosis Date  . Depression   . Headache    migraines  . Mild preeclampsia     PAST SURGICAL HISTORY: Past Surgical History:  Procedure Laterality Date  . CESAREAN SECTION    . FOOT SURGERY Bilateral   . WISDOM TOOTH EXTRACTION      FAMILY HISTORY: Family  History  Problem Relation Age of Onset  . Thyroid disease Mother   . Hyperlipidemia Mother   . Arthritis/Rheumatoid Mother   . Diabetes Mother   . Hypertension Father   . Kidney disease Father   . Cirrhosis Father   . Diabetes Maternal Grandmother   . Stroke Maternal Grandmother   . Stroke Maternal Grandfather     SOCIAL HISTORY:  Social History   Socioeconomic History  . Marital status: Married    Spouse name: Casimiro NeedleMichael  . Number of children: 1  . Years of education: 5112  . Highest education level: Not on file  Social Needs  . Financial resource strain: Not on file  . Food insecurity - worry: Not on file  . Food insecurity - inability: Not on file  . Transportation needs - medical: Not on file  . Transportation needs - non-medical: Not on file  Occupational History    Comment: clincal registration  Tobacco Use  . Smoking status: Never Smoker  . Smokeless tobacco: Never Used  Substance and Sexual Activity  . Alcohol use: No  . Drug use: No  . Sexual  activity: Yes  Other Topics Concern  . Not on file  Social History Narrative   Lives at home with husband and son   Drinks 1 soda a day      PHYSICAL EXAM  Vitals:   05/22/17 1124  BP: 128/84  Pulse: 85  Weight: 141 lb 9.6 oz (64.2 kg)  Height: 5' 1.5" (1.562 m)   Wt Readings from Last 3 Encounters:  05/22/17 141 lb 9.6 oz (64.2 kg)  05/06/17 138 lb (62.6 kg)  05/02/17 141 lb (64 kg)   Body mass index is 26.32 kg/m.  No exam data present  No flowsheet data found.  GENERAL EXAM: Patient is in no distress; well developed, nourished and groomed; neck is supple  CARDIOVASCULAR: Regular rate and rhythm, no murmurs, no carotid bruits  NEUROLOGIC: MENTAL STATUS: awake, alert, language fluent, comprehension intact, naming intact, fund of knowledge appropriate CRANIAL NERVE: no papilledema on fundoscopic exam, pupils equal and reactive to light, visual fields full to confrontation, extraocular muscles  intact, no nystagmus, facial sensation and strength symmetric, hearing intact, palate elevates symmetrically, uvula midline, shoulder shrug symmetric, tongue midline. MOTOR: normal bulk and tone, full strength in the BUE, BLE SENSORY: normal and symmetric to light touch, temperature, vibration  COORDINATION: finger-nose-finger, fine finger movements normal REFLEXES: deep tendon reflexes present and symmetric GAIT/STATION: narrow based gait; able to walk tandem; romberg is negative    DIAGNOSTIC DATA (LABS, IMAGING, TESTING) - I reviewed patient records, labs, notes, testing and imaging myself where available.  Lab Results  Component Value Date   WBC 5.9 12/11/2016   HGB 13.5 12/11/2016   HCT 40.3 12/11/2016   MCV 97 12/11/2016   PLT 378 12/11/2016      Component Value Date/Time   NA 143 12/11/2016 0917   K 4.5 12/11/2016 0917   CL 108 (H) 12/11/2016 0917   CO2 20 12/11/2016 0917   GLUCOSE 82 12/11/2016 0917   BUN 11 12/11/2016 0917   CREATININE 0.67 12/11/2016 0917   CALCIUM 9.3 12/11/2016 0917   PROT 7.3 12/11/2016 0917   ALBUMIN 4.6 12/11/2016 0917   AST 14 12/11/2016 0917   ALT 10 12/11/2016 0917   ALKPHOS 83 12/11/2016 0917   BILITOT 0.4 12/11/2016 0917   GFRNONAA 118 12/11/2016 0917   GFRAA 135 12/11/2016 0917   No results found for: CHOL, HDL, LDLCALC, LDLDIRECT, TRIG, CHOLHDL Lab Results  Component Value Date   HGBA1C 5.2 08/12/2016   Lab Results  Component Value Date   VITAMINB12 249 12/11/2016   Lab Results  Component Value Date   TSH 1.830 12/11/2016    12/13/14 MRI brain [I reviewed images myself and agree with interpretation. -VRP]  - normal    ASSESSMENT AND PLAN  31 y.o. year old female here with new onset headache, blurred vision, possible optic nerve edema (but later found to be normal), since April 2016. MRI, labs, and eye exam are unremarkable. Likely represents migraine with aura. Also with chronic insomnia and mild anxiety  symptoms.  Meds tried and failed: amitriptyline, imitrex, toradol, flexeril, topiramate, gabapentin, sertraline   Dx:   Migraine without aura and without status migrainosus, not intractable  Tension headache    PLAN:  MIGRAINE PREVENTION - continue topiramate 100mg  twice a day; for migraine prevention - stop gabapentin (not effective) - try ajovy or emgality for migraine prevention - may consider botox in future  MIGRAINE RESCUE - ibuprofen, tylenol - cambia as needed  - rizatriptan as needed  INSOMNIA /  ANXIETY - consider psychiatry / psychology follow up  Meds ordered this encounter  Medications  . rizatriptan (MAXALT-MLT) 10 MG disintegrating tablet    Sig: Take 1 tablet (10 mg total) as needed by mouth for migraine. May repeat in 2 hours if needed    Dispense:  9 tablet    Refill:  11  . topiramate (TOPAMAX) 100 MG tablet    Sig: Take 1 tablet (100 mg total) 2 (two) times daily by mouth.    Dispense:  180 tablet    Refill:  4   Return in about 6 months (around 11/19/2017).    Suanne MarkerVIKRAM R. PENUMALLI, MD 05/22/2017, 11:30 AM Certified in Neurology, Neurophysiology and Neuroimaging  [facility] [address], [address] [place], [place] [address] [phone]

## 2017-06-01 ENCOUNTER — Telehealth: Payer: Self-pay | Admitting: *Deleted

## 2017-06-01 MED FILL — EMGALITY 120 MG/ML SOAJ: 120 | 1 days supply | Qty: 2 | Fill #0

## 2017-06-01 NOTE — Telephone Encounter (Signed)
Received PA approval for Emgality.  Ref # [id number], Plan code [id number].  (2 pens for the first fill followed by 120mg /ml for one pen per 30 days effective 07/23/2017 thru 12-20-2017 ref # [id number].  06/01/17 [phone].  Fax confirmation received  To [facility] outpt pharm).  [phone].

## 2017-06-02 ENCOUNTER — Telehealth: Payer: 59 | Admitting: Nurse Practitioner

## 2017-06-02 DIAGNOSIS — J020 Streptococcal pharyngitis: Secondary | ICD-10-CM | POA: Diagnosis not present

## 2017-06-02 MED ORDER — AMOXICILLIN-POT CLAVULANATE 875-125 MG PO TABS: 1.0000 | ORAL_TABLET | Freq: Two times a day (BID) | ORAL | 0 refills | Status: DC

## 2017-06-02 NOTE — Progress Notes (Signed)
We are sorry that you are not feeling well.  Here is how we plan to help!  Based on what you have shared with me it looks like you have strep pharyngitis.  Strep pharyngitis is inflammation and infection in the back of the throat.  This is an infection caused by bacteria and is treated with antibiotics. I have prescribed Augmentin 875mg/125mg one tablet twice daily with food, for 7 days. one tablet twice daily with food, for 7 days. You may use an oral throat lozenges as needed. Strep infections are not as easily transmitted as other respiratory infection, however we still recommend that you avoid close contact with loved ones, especially the very young and elderly.  Remember to wash your hands thoroughly throughout the day as this is the number one way to prevent the spread of infection!  Home Care:  Only take medications as instructed by your medical team.  Complete the entire course of an antibiotic.  Do not take these medications with alcohol.  A steam or ultrasonic humidifier can help congestion.  You can place a towel over your head and breathe in the steam from hot water coming from a faucet.  Avoid close contacts especially the very young and the elderly.  Cover your mouth when you cough or sneeze.  Always remember to wash your hands.  Get Help Right Away If:  You develop worsening fever or sinus pain.  You develop a severe head ache or visual changes.  Your symptoms persist after you have completed your treatment plan.  Make sure you  Understand these instructions.  Will watch your condition.  Will get help right away if you are not doing well or get worse.  Your e-visit answers were reviewed by a board certified advanced clinical practitioner to complete your personal care plan.  Depending on the condition, your plan could have included both over the counter or prescription medications.  If there is a problem please reply  once you have received a response from your  provider.  Your safety is important to us.  If you have drug allergies check your prescription carefully.    You can use MyChart to ask questions about today's visit, request a non-urgent call back, or ask for a work or school excuse for 24 hours related to this e-Visit. If it has been greater than 24 hours you will need to follow up with your provider, or enter a new e-Visit to address those concerns.  You will get an e-mail in the next two days asking about your experience.  I hope that your e-visit has been valuable and will speed your recovery. Thank you for using e-visits.            

## 2017-06-04 ENCOUNTER — Telehealth: Payer: Self-pay | Admitting: *Deleted

## 2017-06-04 NOTE — Telephone Encounter (Signed)
Pt called asking about how to take the emgality injections for prevention of migraines as she received 2 syringes.  She received from pharmacy.  She is to inject 2 x 120mg  SQ at once (loading dose) first, then inject 120mg  SQ once monthly thereafter.  There is a Engineer, miningMgality website video she may watch.  She did receive information with visuals when picked up prescription.  She will take when at work, (as she works in a Industrial/product designermedical office.).   She will call back if questions.  [facility] prescription with 10 refills.

## 2017-06-09 DIAGNOSIS — Z30431 Encounter for routine checking of intrauterine contraceptive device: Secondary | ICD-10-CM | POA: Diagnosis not present

## 2017-06-11 ENCOUNTER — Telehealth: Payer: Self-pay

## 2017-06-11 NOTE — Telephone Encounter (Signed)
I received a prior auth request for emgality. I have completed and submitted the PA and should have a determination within 48-72 hours.  Key: [id number] [id number]

## 2017-06-12 NOTE — Telephone Encounter (Signed)
Received approval see other phone note.  I called pts pharmacy and pt already picked up.

## 2017-06-15 MED FILL — EMGALITY 120 MG/ML SOAJ: 120 | 28 days supply | Qty: 1 | Fill #0

## 2017-06-17 NOTE — Telephone Encounter (Signed)
Received fax from [employer]. Emgality 120 mg/mL has been approved, maximum of 5 refills from 06/15/17 to 10/13/17.

## 2017-06-26 ENCOUNTER — Encounter: Payer: Self-pay | Admitting: Nurse Practitioner

## 2017-06-26 ENCOUNTER — Other Ambulatory Visit: Payer: Self-pay | Admitting: Nurse Practitioner

## 2017-06-26 DIAGNOSIS — G4709 Other insomnia: Secondary | ICD-10-CM

## 2017-06-26 MED ORDER — ZOLPIDEM TARTRATE 10 MG PO TABS: 10.0000 mg | ORAL_TABLET | Freq: Every evening | ORAL | 1 refills | Status: DC | PRN

## 2017-06-26 MED FILL — ZOLPIDEM TARTRATE 10 MG TAB: 10 | 30 days supply | Qty: 30 | Fill #0

## 2017-06-26 NOTE — Progress Notes (Signed)
Rx called to pharmacy

## 2017-06-26 NOTE — Progress Notes (Signed)
Please call in ambiuen 10 mg  1 po qhs with 1 refills

## 2017-06-26 NOTE — Progress Notes (Signed)
[facility]. Patient notified

## 2017-07-20 ENCOUNTER — Encounter: Payer: Self-pay | Admitting: Diagnostic Neuroimaging

## 2017-07-23 MED FILL — EMGALITY 120 MG/ML SOAJ: 120 | 28 days supply | Qty: 1 | Fill #1

## 2017-07-27 ENCOUNTER — Encounter: Payer: Self-pay | Admitting: Nurse Practitioner

## 2017-07-27 ENCOUNTER — Ambulatory Visit: Payer: 59 | Admitting: Nurse Practitioner

## 2017-07-27 VITALS — BP 117/83 | HR 95 | Temp 97.1°F | Ht 61.0 in | Wt 140.0 lb

## 2017-07-27 DIAGNOSIS — M26621 Arthralgia of right temporomandibular joint: Secondary | ICD-10-CM | POA: Diagnosis not present

## 2017-07-27 DIAGNOSIS — G43119 Migraine with aura, intractable, without status migrainosus: Secondary | ICD-10-CM | POA: Diagnosis not present

## 2017-07-27 MED ORDER — PREDNISONE 20 MG PO TABS: ORAL_TABLET | ORAL | 0 refills | Status: DC

## 2017-07-27 MED ORDER — KETOROLAC TROMETHAMINE 60 MG/2ML IM SOLN
60.0000 mg | Freq: Once | INTRAMUSCULAR | Status: AC
  Administered 2017-07-27: 60 mg via INTRAMUSCULAR

## 2017-07-27 NOTE — Progress Notes (Signed)
   Subjective:    Patient ID: Erica Rodriguez, female    DOB: [birth year], 32 y.o.   MRN: [id number]  HPI Patient comes in today with 2 complaints: - migraine- started over the weekend. Took maxalt and has not helped. She is on topamax  And emdality for prevention. Worse she has had in awhile. Sh esee neurologist for her migraines most of the time. - right ear pain- started 2 days ago. Not sure if related to migraine. Has history of TMJ    Review of Systems  Constitutional: Negative for activity change, appetite change, chills, fatigue and fever.  HENT: Positive for ear pain (right). Negative for congestion, postnasal drip, rhinorrhea, sore throat and trouble swallowing.   Respiratory: Negative for cough.   Cardiovascular: Negative.   Gastrointestinal: Negative.   Neurological: Positive for headaches.  Psychiatric/Behavioral: Negative.        Objective:   Physical Exam  Constitutional: She is oriented to person, place, and time. She appears well-developed and well-nourished. No distress.  HENT:  Right Ear: Hearing, tympanic membrane, external ear and ear canal normal.  Left Ear: Hearing, tympanic membrane, external ear and ear canal normal.  Nose: Nose normal. Right sinus exhibits no maxillary sinus tenderness and no frontal sinus tenderness. Left sinus exhibits no maxillary sinus tenderness and no frontal sinus tenderness.  Mouth/Throat: Uvula is midline, oropharynx is clear and moist and mucous membranes are normal.  Right TM joint pain  Neck: Normal range of motion. Neck supple.  Cardiovascular: Normal rate and regular rhythm.  Pulmonary/Chest: Effort normal and breath sounds normal.  Neurological: She is alert and oriented to person, place, and time. She has normal reflexes. No cranial nerve deficit.  Skin: Skin is warm.  Psychiatric: She has a normal mood and affect. Her behavior is normal. Judgment and thought content normal.   BP 117/83   Pulse 95   Temp (!) 97.1 F (36.2 C)  (Oral)   Ht 5\' 1"  (1.549 m)   Wt 140 lb (63.5 kg)   BMI 26.45 kg/m       Assessment & Plan:  1. Intractable migraine with aura without status migrainosus Rest 'force fluids RTO prn - ketorolac (TORADOL) injection 60 mg  2. Arthralgia of right temporomandibular joint Rest Do not eat foods that require a lot of chewing until pain resolves - predniSONE (DELTASONE) 20 MG tablet; 2 po at sametime daily for 5 days  Dispense: 10 tablet; Refill: 0  [name] Daphine DeutscherMartin, FNP

## 2017-07-27 NOTE — Patient Instructions (Signed)
Recurrent Migraine Headache °A migraine headache is very bad, throbbing pain that is usually on one side of your head. Recurrent migraines keep coming back (recurring). Talk with your doctor about what things may bring on (trigger) your migraine headaches. °Follow these instructions at home: °Medicines  °· Take over-the-counter and prescription medicines only as told by your doctor. °· Do not drive or use heavy machinery while taking prescription pain medicine. °Lifestyle  °· Do not use any products that contain nicotine or tobacco, such as cigarettes and e-cigarettes. If you need help quitting, ask your doctor. °· Limit alcohol intake to no more than 1 drink a day for nonpregnant women and 2 drinks a day for men. One drink equals 12 oz of beer, 5 oz of wine, or 1½ oz of hard liquor. °· Get 7-9 hours of sleep each night. °· Lessen any stress in your life. Ask your doctor about ways to lower your stress. °· Stay at a healthy weight. Talk with your doctor if you need help losing weight. °· Get regular exercise. °General instructions  °· Keep a journal to find out if certain things bring on migraine headaches. For example, write down: °¨ What you eat and drink. °¨ How much sleep you get. °¨ Any change to your diet or medicines. °· Lie down in a dark, quiet room when you have a migraine. °· Try placing a cool towel over your head when you have a migraine. °· Keep lights dim if bright lights bother you or make your migraines worse. °· Keep all follow-up visits as told by your doctor. This is important. °Contact a doctor if: °· Medicine does not help your migraines. °· Your pain keeps coming back. °· You have a fever. °· You have weight loss without trying. °Get help right away if: °· Your migraine becomes really bad and medicine does not help. °· You have a stiff neck. °· You have trouble seeing. °· Your muscles are weak or you lose control of your muscles. °· You lose your balance or have trouble walking. °· You feel  like you will pass out (faint) or you pass out. °· You have really bad symptoms that are different than your first symptoms. °· You start having sudden, very bad headaches that last for one second or less, like a thunderclap. °Summary °· A migraine headache is very bad, throbbing pain that is usually on one side of your head. °· Talk with your doctor about what things may bring on (trigger) your migraine headaches. °· Take over-the-counter and prescription medicines only as told by your doctor. °· Lie down in a dark, quiet room when you have a migraine. °· Keep a journal about what you eat and drink, how much sleep you get, and any changes to your medicines. This can help you find out if certain things make you have migraine headaches. °This information is not intended to replace advice given to you by your health care provider. Make sure you discuss any questions you have with your health care provider. °Document Released: 04/01/2008 Document Revised: 05/16/2016 Document Reviewed: 05/16/2016 °Elsevier Interactive Patient Education © 2017 Elsevier Inc. ° °

## 2017-07-28 ENCOUNTER — Encounter: Payer: Self-pay | Admitting: Nurse Practitioner

## 2017-07-29 ENCOUNTER — Ambulatory Visit: Payer: 59 | Admitting: Pediatrics

## 2017-07-29 ENCOUNTER — Encounter: Payer: Self-pay | Admitting: Pediatrics

## 2017-07-29 VITALS — BP 128/88 | HR 88 | Temp 98.3°F | Ht 61.0 in | Wt 139.5 lb

## 2017-07-29 DIAGNOSIS — M26629 Arthralgia of temporomandibular joint, unspecified side: Secondary | ICD-10-CM

## 2017-07-29 DIAGNOSIS — G43019 Migraine without aura, intractable, without status migrainosus: Secondary | ICD-10-CM

## 2017-07-29 DIAGNOSIS — J029 Acute pharyngitis, unspecified: Secondary | ICD-10-CM | POA: Diagnosis not present

## 2017-07-29 DIAGNOSIS — R51 Headache: Secondary | ICD-10-CM | POA: Diagnosis not present

## 2017-07-29 DIAGNOSIS — M62838 Other muscle spasm: Secondary | ICD-10-CM

## 2017-07-29 LAB — CULTURE, GROUP A STREP

## 2017-07-29 LAB — RAPID STREP SCREEN (MED CTR MEBANE ONLY): Strep Gp A Ag, IA W/Reflex: NEGATIVE

## 2017-07-29 MED ORDER — CYCLOBENZAPRINE HCL 5 MG PO TABS: 5.0000 mg | ORAL_TABLET | Freq: Three times a day (TID) | ORAL | 1 refills | Status: DC | PRN

## 2017-07-29 MED ORDER — KETOROLAC TROMETHAMINE 60 MG/2ML IM SOLN
60.0000 mg | Freq: Once | INTRAMUSCULAR | Status: AC
  Administered 2017-07-29: 60 mg via INTRAMUSCULAR

## 2017-07-29 MED FILL — RIZATRIPTAN 10 MG ODT: 10 | 30 days supply | Qty: 9 | Fill #1

## 2017-07-29 NOTE — Progress Notes (Signed)
Subjective:   Patient ID: Erica Rodriguez, female    DOB: June 09, 1986, 32 y.o.   MRN: [id number] CC: headache, sore throat HPI: Erica Rodriguez is a 32 y.o. female presenting for multiple complaints  Started having R ear pain 4-6 days ago Has had TMJ problems during this time as well Has had a migraine for past 3 days, hurts R side of her head/R eye. Feels like previous migraines thought they dont usually last this long. Follows with neurology for migraines Was treated with prednisone and toradol for TMJ/migraine 2 days ago toradol helped ease headache some Says she has felt anxious off and on past few days since starting the prednisone, feels like the muscles in her neck have gotten tighter  She doesn't think she grinds her teeth at night Has seen dentist for TMJ, he wasn't sure why she was having it per pt  Appetite down bc she isnt feeling well Her throat started hurting this morning Her child had strep throat last week No fevers Has been taking ibuprofen off and on, last dose 10am this morning  Relevant past medical, surgical, family and social history reviewed. Allergies and medications reviewed and updated. Social History   Tobacco Use  Smoking Status Never Smoker  Smokeless Tobacco Never Used   ROS: Per HPI   Objective:    BP 128/88   Pulse 88   Temp 98.3 F (36.8 C) (Oral)   Ht 5\' 1"  (1.549 m)   Wt 139 lb 8 oz (63.3 kg)   BMI 26.36 kg/m   Wt Readings from Last 3 Encounters:  07/29/17 139 lb 8 oz (63.3 kg)  07/27/17 140 lb (63.5 kg)  05/22/17 141 lb 9.6 oz (64.2 kg)    Gen: NAD, alert, tired appearing, cooperative with exam, NCAT EYES: EOMI, no conjunctival injection, or no icterus ENT:  TMs pearly gray b/l, OP with mild injection and erythema LYMPH: no cervical LAD CV: NRRR, normal S1/S2, no murmur, distal pulses 2+ b/l Resp: CTABL, no wheezes, normal WOB Abd: +BS, soft, NTND.  Ext: No edema, warm Neuro: Alert and oriented, strength equal b/l UE and LE,  coordination grossly normal MSK: tight muscles posterior neck/paraspinal muscles  Assessment & Plan:  Diagnoses and all orders for this visit:  Sore throat Strep negative Symptoms likely due to acute URI Discussed symptom treatment, return precautions -     Rapid Strep Screen (Not at [facility]) -     Cancel: Culture, Group A Strep -     Culture, Group A Strep  Neck muscle spasm Has taken flexeril for similar symptoms in past with some relief, sent in below. Do not take with other medications that cause drowsiness -     cyclobenzaprine (FLEXERIL) 5 MG tablet; Take 1 tablet (5 mg total) by mouth 3 (three) times daily as needed for muscle spasms. -     Culture, Group A Strep  TMJ syndrome Cont NSAIDs, rest, avoid gum, chewy foods  Intractable migraine without aura and without status migrainosus toradol shot tonight helped some in past, do not take ibuprofen for 24h -     ketorolac (TORADOL) injection 60 mg   Follow up plan: Return if symptoms worsen or fail to improve. Rex Kras, MD [place] [facility]

## 2017-08-01 LAB — CULTURE, GROUP A STREP: Strep A Culture: NEGATIVE

## 2017-08-08 ENCOUNTER — Telehealth: Payer: 59 | Admitting: Nurse Practitioner

## 2017-08-08 DIAGNOSIS — J020 Streptococcal pharyngitis: Secondary | ICD-10-CM

## 2017-08-08 DIAGNOSIS — J988 Other specified respiratory disorders: Secondary | ICD-10-CM | POA: Diagnosis not present

## 2017-08-08 MED ORDER — AMOXICILLIN-POT CLAVULANATE 875-125 MG PO TABS: 1.0000 | ORAL_TABLET | Freq: Two times a day (BID) | ORAL | 0 refills | Status: DC

## 2017-08-08 NOTE — Progress Notes (Signed)
We are sorry that you are not feeling well.  Here is how we plan to help!  Based on what you have shared with me it looks like you have strep pharyngitis.  Strep pharyngitis is inflammation and infection in the back of the throat.  This is an infection caused by bacteria and is treated with antibiotics. I have prescribed Augmentin 875mg/125mg one tablet twice daily with food, for 7 days. one tablet twice daily with food, for 7 days. You may use an oral throat lozenges as needed. Strep infections are not as easily transmitted as other respiratory infection, however we still recommend that you avoid close contact with loved ones, especially the very young and elderly.  Remember to wash your hands thoroughly throughout the day as this is the number one way to prevent the spread of infection!  Home Care:  Only take medications as instructed by your medical team.  Complete the entire course of an antibiotic.  Do not take these medications with alcohol.  A steam or ultrasonic humidifier can help congestion.  You can place a towel over your head and breathe in the steam from hot water coming from a faucet.  Avoid close contacts especially the very young and the elderly.  Cover your mouth when you cough or sneeze.  Always remember to wash your hands.  Get Help Right Away If:  You develop worsening fever or sinus pain.  You develop a severe head ache or visual changes.  Your symptoms persist after you have completed your treatment plan.  Make sure you  Understand these instructions.  Will watch your condition.  Will get help right away if you are not doing well or get worse.  Your e-visit answers were reviewed by a board certified advanced clinical practitioner to complete your personal care plan.  Depending on the condition, your plan could have included both over the counter or prescription medications.  If there is a problem please reply  once you have received a response from your  provider.  Your safety is important to us.  If you have drug allergies check your prescription carefully.    You can use MyChart to ask questions about today's visit, request a non-urgent call back, or ask for a work or school excuse for 24 hours related to this e-Visit. If it has been greater than 24 hours you will need to follow up with your provider, or enter a new e-Visit to address those concerns.  You will get an e-mail in the next two days asking about your experience.  I hope that your e-visit has been valuable and will speed your recovery. Thank you for using e-visits.            

## 2017-08-21 MED FILL — ZOLPIDEM TARTRATE 10 MG TAB: 10 | 30 days supply | Qty: 30 | Fill #1

## 2017-08-28 MED FILL — EMGALITY 120 MG/ML SOAJ: 120 | 28 days supply | Qty: 1 | Fill #2

## 2017-09-08 MED FILL — TOPIRAMATE 100 MG TABLET: 100 | 90 days supply | Qty: 180 | Fill #1

## 2017-09-14 ENCOUNTER — Ambulatory Visit: Payer: 59 | Admitting: Physician Assistant

## 2017-09-14 ENCOUNTER — Encounter: Payer: Self-pay | Admitting: Physician Assistant

## 2017-09-14 VITALS — BP 121/86 | HR 99 | Temp 97.7°F | Ht 61.0 in | Wt 139.4 lb

## 2017-09-14 DIAGNOSIS — M62838 Other muscle spasm: Secondary | ICD-10-CM | POA: Diagnosis not present

## 2017-09-14 DIAGNOSIS — G43519 Persistent migraine aura without cerebral infarction, intractable, without status migrainosus: Secondary | ICD-10-CM | POA: Diagnosis not present

## 2017-09-14 MED ORDER — DULOXETINE HCL 30 MG PO CPEP: 30.0000 mg | ORAL_CAPSULE | Freq: Every day | ORAL | 3 refills | Status: DC

## 2017-09-14 MED ORDER — PROMETHAZINE HCL 25 MG/ML IJ SOLN
25.0000 mg | Freq: Once | INTRAMUSCULAR | Status: AC
  Administered 2017-09-14: 25 mg via INTRAMUSCULAR

## 2017-09-14 MED ORDER — METHYLPREDNISOLONE ACETATE 80 MG/ML IJ SUSP
80.0000 mg | Freq: Once | INTRAMUSCULAR | Status: AC
  Administered 2017-09-14: 80 mg via INTRAMUSCULAR

## 2017-09-14 MED ORDER — CYCLOBENZAPRINE HCL 5 MG PO TABS
5.0000 mg | ORAL_TABLET | Freq: Three times a day (TID) | ORAL | 2 refills | Status: DC | PRN
Start: 2017-09-14 — End: 2018-01-01

## 2017-09-14 MED ORDER — KETOROLAC TROMETHAMINE 60 MG/2ML IM SOLN
60.0000 mg | Freq: Once | INTRAMUSCULAR | Status: AC
  Administered 2017-09-14: 60 mg via INTRAMUSCULAR

## 2017-09-15 MED FILL — DULoxetine HCL 30 MG CPEP: 30 | 30 days supply | Qty: 30 | Fill #0

## 2017-09-15 NOTE — Progress Notes (Signed)
BP 121/86   Pulse 99   Temp 97.7 F (36.5 C) (Oral)   Ht 5\' 1"  (1.549 m)   Wt 139 lb 6.4 oz (63.2 kg)   BMI 26.34 kg/m    Subjective:    Patient ID: Erica Rodriguez, female    DOB: May 11, 1986, 32 y.o.   MRN: [id number]  HPI: Erica Rodriguez is a 32 y.o. female presenting on 09/14/2017 for Headache and Nausea  Patient with known migraine history. She has been having much more number and severity of headaches in the past few months. The topamax has not been helping as much as it did to begin with.  Current headache has gone on for 3 days. Nothing has helped. She is on emgality. Encouraged her to continue it.  Discussed a headache cocktail, will give her everything but the benadryl. Which she will take at home.  Discussed cymbalta as a med for prevention and to help with stress.  Past Medical History:  Diagnosis Date  . Depression   . Headache    migraines  . Mild preeclampsia    Relevant past medical, surgical, family and social history reviewed and updated as indicated. Interim medical history since our last visit reviewed. Allergies and medications reviewed and updated. DATA REVIEWED: CHART IN EPIC  Family History reviewed for pertinent findings.  Review of Systems  Constitutional: Negative.  Negative for diaphoresis.  HENT: Negative.   Eyes: Positive for photophobia.  Respiratory: Negative.   Gastrointestinal: Positive for nausea and vomiting.  Genitourinary: Negative.   Neurological: Positive for headaches. Negative for dizziness, syncope, weakness and numbness.    Allergies as of 09/14/2017      Reactions   Amitriptyline Other (See Comments)   Constipation, grogginess      Medication List        Accurate as of 09/14/17 11:59 PM. Always use your most recent med list.          acetaminophen 500 MG tablet Commonly known as:  TYLENOL Take 500 mg by mouth every 6 (six) hours as needed.   cyclobenzaprine 5 MG tablet Commonly known as:  FLEXERIL Take 1 tablet (5 mg  total) by mouth 3 (three) times daily as needed for muscle spasms.   DULoxetine 30 MG capsule Commonly known as:  CYMBALTA Take 1 capsule (30 mg total) by mouth daily.   EMGALITY 120 MG/ML Soaj Generic drug:  Galcanezumab-gnlm Inject into the skin.   fluticasone 50 MCG/ACT nasal spray Commonly known as:  FLONASE Place 2 sprays into both nostrils daily.   MIRENA (52 MG) 20 MCG/24HR IUD Generic drug:  levonorgestrel   rizatriptan 10 MG disintegrating tablet Commonly known as:  MAXALT-MLT Take 1 tablet (10 mg total) as needed by mouth for migraine. May repeat in 2 hours if needed   topiramate 100 MG tablet Commonly known as:  TOPAMAX Take 1 tablet (100 mg total) 2 (two) times daily by mouth.   zolpidem 10 MG tablet Commonly known as:  AMBIEN Take 1 tablet (10 mg total) by mouth at bedtime as needed. for sleep          Objective:    BP 121/86   Pulse 99   Temp 97.7 F (36.5 C) (Oral)   Ht 5\' 1"  (1.549 m)   Wt 139 lb 6.4 oz (63.2 kg)   BMI 26.34 kg/m   Allergies  Allergen Reactions  . Amitriptyline Other (See Comments)    Constipation, grogginess    Wt Readings from Last  3 Encounters:  09/14/17 139 lb 6.4 oz (63.2 kg)  07/29/17 139 lb 8 oz (63.3 kg)  07/27/17 140 lb (63.5 kg)    Physical Exam  Constitutional: She is oriented to person, place, and time. She appears well-developed and well-nourished. She appears distressed.  HENT:  Head: Normocephalic and atraumatic.  Eyes: Conjunctivae and EOM are normal. Pupils are equal, round, and reactive to light.  Cardiovascular: Normal rate, regular rhythm, normal heart sounds and intact distal pulses.  Pulmonary/Chest: Effort normal and breath sounds normal.  Abdominal: Soft. Bowel sounds are normal.  Neurological: She is alert and oriented to person, place, and time. She has normal reflexes. She displays normal reflexes. No cranial nerve deficit. She exhibits normal muscle tone. Coordination normal.  Skin: Skin is  warm and dry. No rash noted.  Psychiatric: She has a normal mood and affect. Her behavior is normal. Judgment and thought content normal.  Nursing note and vitals reviewed.        Assessment & Plan:   1. Migraine aura, persistent, intractable - promethazine (PHENERGAN) injection 25 mg - ketorolac (TORADOL) injection 60 mg - methylPREDNISolone acetate (DEPO-MEDROL) injection 80 mg - DULoxetine (CYMBALTA) 30 MG capsule; Take 1 capsule (30 mg total) by mouth daily.  Dispense: 30 capsule; Refill: 3  2. Neck muscle spasm - cyclobenzaprine (FLEXERIL) 5 MG tablet; Take 1 tablet (5 mg total) by mouth 3 (three) times daily as needed for muscle spasms.  Dispense: 60 tablet; Refill: 2   Continue all other maintenance medications as listed above.  Follow up plan: Follow up in the next few weeks  Educational handout given for survey  Remus Loffler PA-C [facility] [address]  [place], [place] [address] [phone]   09/15/2017, 11:32 PM

## 2017-09-17 ENCOUNTER — Encounter: Payer: Self-pay | Admitting: Diagnostic Neuroimaging

## 2017-09-17 ENCOUNTER — Encounter: Payer: Self-pay | Admitting: Family Medicine

## 2017-09-17 ENCOUNTER — Ambulatory Visit: Payer: 59 | Admitting: Family Medicine

## 2017-09-17 VITALS — BP 129/85 | HR 122 | Temp 97.8°F | Ht 61.0 in | Wt 137.0 lb

## 2017-09-17 DIAGNOSIS — G43701 Chronic migraine without aura, not intractable, with status migrainosus: Secondary | ICD-10-CM

## 2017-09-17 MED ORDER — KETOROLAC TROMETHAMINE 60 MG/2ML IM SOLN
60.0000 mg | Freq: Once | INTRAMUSCULAR | Status: AC
  Administered 2017-09-17: 60 mg via INTRAMUSCULAR

## 2017-09-17 MED ORDER — PROMETHAZINE HCL 25 MG/ML IJ SOLN
12.5000 mg | Freq: Once | INTRAMUSCULAR | Status: AC
  Administered 2017-09-17: 12.5 mg via INTRAMUSCULAR

## 2017-09-17 NOTE — Patient Instructions (Addendum)
As we discussed, your symptoms may be secondary to the recent use of Cymbalta but it is difficult to tell.  I would like you to discuss this medication with your neurologist prior to restarting.  I will also send a message to them letting them know that you were seen Monday and today.  Perhaps you can get in sooner than May.  I would discuss with your neurologist revisiting TCA medications, particularly nortriptyline, if this class worked well for you in the past.  Nortriptyline is somewhat "cleaner" and amitriptyline.  I will defer this decision to your neurologist.

## 2017-09-17 NOTE — Progress Notes (Signed)
Subjective: CC: Migraine? PCP: Bennie PieriniMartin, [name], FNP PPI:RJJOACHPI:Erica Rodriguez is a 32 y.o. female presenting to clinic today for:  1. ?Migraine Patient was seen on Monday for migraine headache.  She was given a migraine cocktail here in office.  She was also started on Cymbalta 30 mg daily as of her migraines were thought to be stress related.  She reports that on Tuesday she was feeling much better and started the Cymbalta.  She notes that on Wednesday she woke up nauseous, dizzy and felt she had a bitemporal headache.  She reports feeling that the room was spinning with positional changes.  Denies vomiting, URI symptoms, including cough, congestion, rhinorrhea, fevers.  She is currently seen by neurology, who she was not able to secure an appointment with until May.  She notes that she is currently being treated with Emgality, Topamax and Maxalt.  She also has been using Flexeril intermittently for associated muscle tension, which her PCP thought was likely also contributing to her migraine headaches.  In the past, she reports that she was placed on amitriptyline, which did control migraine symptoms but did cause her to be severely constipated and dry.  For this reason it was discontinued.  She has never been on nortriptyline or revisited TCA class in general.     ROS: Per HPI  Allergies  Allergen Reactions  . Amitriptyline Other (See Comments)    Constipation, grogginess   Past Medical History:  Diagnosis Date  . Depression   . Headache    migraines  . Mild preeclampsia     Current Outpatient Medications:  .  acetaminophen (TYLENOL) 500 MG tablet, Take 500 mg by mouth every 6 (six) hours as needed., Disp: , Rfl:  .  cyclobenzaprine (FLEXERIL) 5 MG tablet, Take 1 tablet (5 mg total) by mouth 3 (three) times daily as needed for muscle spasms., Disp: 60 tablet, Rfl: 2 .  DULoxetine (CYMBALTA) 30 MG capsule, Take 1 capsule (30 mg total) by mouth daily., Disp: 30 capsule, Rfl: 3 .   fluticasone (FLONASE) 50 MCG/ACT nasal spray, Place 2 sprays into both nostrils daily., Disp: 16 g, Rfl: 6 .  Galcanezumab-gnlm (EMGALITY) 120 MG/ML SOAJ, Inject into the skin., Disp: , Rfl:  .  MIRENA, 52 MG, 20 MCG/24HR IUD, , Disp: , Rfl:  .  rizatriptan (MAXALT-MLT) 10 MG disintegrating tablet, Take 1 tablet (10 mg total) as needed by mouth for migraine. May repeat in 2 hours if needed, Disp: 9 tablet, Rfl: 11 .  topiramate (TOPAMAX) 100 MG tablet, Take 1 tablet (100 mg total) 2 (two) times daily by mouth., Disp: 180 tablet, Rfl: 4 .  zolpidem (AMBIEN) 10 MG tablet, Take 1 tablet (10 mg total) by mouth at bedtime as needed. for sleep, Disp: 30 tablet, Rfl: 1  Current Facility-Administered Medications:  .  betamethasone acetate-betamethasone sodium phosphate (CELESTONE) injection 6 mg, 6 mg, Intramuscular, Once, Mechele ClaudeStacks, Warren, MD Social History   Socioeconomic History  . Marital status: Married    Spouse name: Casimiro NeedleMichael  . Number of children: 1  . Years of education: 1412  . Highest education level: Not on file  Social Needs  . Financial resource strain: Not on file  . Food insecurity - worry: Not on file  . Food insecurity - inability: Not on file  . Transportation needs - medical: Not on file  . Transportation needs - non-medical: Not on file  Occupational History    Comment: clincal registration  Tobacco Use  . Smoking status:  Never Smoker  . Smokeless tobacco: Never Used  Substance and Sexual Activity  . Alcohol use: No  . Drug use: No  . Sexual activity: Yes  Other Topics Concern  . Not on file  Social History Narrative   Lives at home with husband and son   Drinks 1 soda a day    Family History  Problem Relation Age of Onset  . Thyroid disease Mother   . Hyperlipidemia Mother   . Arthritis/Rheumatoid Mother   . Diabetes Mother   . Hypertension Father   . Kidney disease Father   . Cirrhosis Father   . Diabetes Maternal Grandmother   . Stroke Maternal Grandmother     . Stroke Maternal Grandfather     Objective: Office vital signs reviewed. BP 129/85   Pulse (!) 122   Temp 97.8 F (36.6 C) (Oral)   Ht 5\' 1"  (1.549 m)   Wt 137 lb (62.1 kg)   BMI 25.89 kg/m   Physical Examination:  General: Awake, alert, well nourished, No acute distress HEENT: Normal    Eyes: PERRLA, extraocular membranes intact, sclera white     Throat: moist mucus membranes, symmetric rise of palate Cardio: regular rate (after she was allowed to rest) and rhythm, S1S2 heard, no murmurs appreciated Pulm: clear to auscultation bilaterally, no wheezes, rhonchi or rales; normal work of breathing on room air Extremities: warm, well perfused, No edema, cyanosis or clubbing; +2 pulses bilaterally MSK: normal gait and normal station Neuro: 5/5 UE and LE Strength and light touch sensation grossly intact, cranial nerves II through XII grossly intact.  Normal upper and lower cerebellar testing.  Alert and oriented x3.  No focal neurologic deficits.  Assessment/ Plan: 32 y.o. female   1. Chronic migraine without aura with status migrainosus, not intractable She was given a dose of promethazine 12.5 mg and Toradol 60 mg here in office.  I instructed her to hold off on the Cymbalta since this seems to be the only new exposure possibly contributing to symptoms.  However, we did discuss that symptoms may be ongoing infestations of her migraine headaches.  I did recommend that she contact her neurologist prior to reinitiating Cymbalta to make sure that this is indeed the medication they wish to have her put on.  We also discussed that she may consider revisiting the TCA class, possibly trying out nortriptyline since it has a slightly cleaner profile than the amitriptyline she was on previously.  Continue hydration.  Get plenty of rest.  Reasons for emergent evaluation emergency department discussed.  Work note provided.  Patient was good understanding and will contact her neurologist for  instructions. - promethazine (PHENERGAN) injection 12.5 mg - ketorolac (TORADOL) injection 60 mg   Meds ordered this encounter  Medications  . promethazine (PHENERGAN) injection 12.5 mg  . ketorolac (TORADOL) injection 60 mg     Raliegh Ip, DO [facility] [phone]

## 2017-09-28 ENCOUNTER — Encounter: Payer: Self-pay | Admitting: Diagnostic Neuroimaging

## 2017-09-28 ENCOUNTER — Encounter: Payer: Self-pay | Admitting: Nurse Practitioner

## 2017-09-28 ENCOUNTER — Other Ambulatory Visit: Payer: Self-pay | Admitting: Nurse Practitioner

## 2017-09-28 DIAGNOSIS — G4709 Other insomnia: Secondary | ICD-10-CM

## 2017-09-28 MED ORDER — ZOLPIDEM TARTRATE 10 MG PO TABS: 10.0000 mg | ORAL_TABLET | Freq: Every evening | ORAL | 1 refills | Status: DC | PRN

## 2017-09-28 MED FILL — EMGALITY 120 MG/ML SOAJ: 120 | 28 days supply | Qty: 1 | Fill #3

## 2017-10-02 ENCOUNTER — Telehealth: Payer: Self-pay | Admitting: Diagnostic Neuroimaging

## 2017-10-02 NOTE — Telephone Encounter (Signed)
3/29- called pt, no answer but LVM  to make pt aware that we have received FMLA form and that there is a $50 fee before the form can be filled out

## 2017-10-05 ENCOUNTER — Telehealth: Payer: Self-pay | Admitting: *Deleted

## 2017-10-05 NOTE — Telephone Encounter (Signed)
Spoke with patient to discuss FMLA papers. Received information,  Completed and placed on Dr Visteon CorporationPenumalli's desk for review, signature. Patient paid fee this morning and requested a copy be mailed to her once FMLA papers are faxed to [employer]. This RN advised will let her know when papers have been processed. She verbalized understanding, appreciation of call.

## 2017-10-05 NOTE — Telephone Encounter (Signed)
On CMM Emgality PA was initiated. information entered. Reply per CMM came back, [facility] for  [facility].

## 2017-10-05 NOTE — Telephone Encounter (Signed)
Pt [employer] form on [employer] C desk.

## 2017-10-06 DIAGNOSIS — Z0289 Encounter for other administrative examinations: Secondary | ICD-10-CM

## 2017-10-08 NOTE — Telephone Encounter (Signed)
Received a second fax from [employer] stating Emgality PA needed. On CMM web site, used the chat feature and re-entered data. Now pending decision from [employer].

## 2017-10-12 NOTE — Telephone Encounter (Signed)
Per CMM, Emgality approved, effective 10/14/17 through 10/14/18 for 12 fills.

## 2017-10-16 ENCOUNTER — Telehealth: Payer: 59 | Admitting: Family

## 2017-10-16 DIAGNOSIS — J020 Streptococcal pharyngitis: Secondary | ICD-10-CM | POA: Diagnosis not present

## 2017-10-16 MED ORDER — AMOXICILLIN 500 MG PO CAPS
500.0000 mg | ORAL_CAPSULE | Freq: Two times a day (BID) | ORAL | 0 refills | Status: DC
Start: 2017-10-16 — End: 2017-11-06

## 2017-10-16 NOTE — Progress Notes (Signed)
Thank you for the details you included in the comment boxes. Those details are very helpful in determining the best course of treatment for you and help us to provide the best care. Given your exposure to verified strep along with other symptoms, the best idea is to treat it as strep with Amoxicillin, which would also treat sinusitis/pharyngitis if you have that instead or if you have that also. Either way, the treatment is correct.  We are sorry that you are not feeling well.  Here is how we plan to help!  Based on what you have shared with me it looks like you have sinusitis.  Sinusitis is inflammation and infection in the sinus cavities of the head.  Based on your presentation I believe you most likely have Acute Bacterial Sinusitis.  This is an infection caused by bacteria and is treated with antibiotics. I have prescribed Amoxicillin 500mg , by mouth twice daily for 10 days.  You may use an oral decongestant such as Mucinex D or if you have glaucoma or high blood pressure use plain Mucinex. Saline nasal spray help and can safely be used as often as needed for congestion.  If you develop worsening sinus pain, fever or notice severe headache and vision changes, or if symptoms are not better after completion of antibiotic, please schedule an appointment with a health care provider.    Sinus infections are not as easily transmitted as other respiratory infection, however we still recommend that you avoid close contact with loved ones, especially the very young and elderly.  Remember to wash your hands thoroughly throughout the day as this is the number one way to prevent the spread of infection!  Home Care:  Only take medications as instructed by your medical team.  Complete the entire course of an antibiotic.  Do not take these medications with alcohol.  A steam or ultrasonic humidifier can help congestion.  You can place a towel over your head and breathe in the steam from hot water coming from a  faucet.  Avoid close contacts especially the very young and the elderly.  Cover your mouth when you cough or sneeze.  Always remember to wash your hands.  Get Help Right Away If:  You develop worsening fever or sinus pain.  You develop a severe head ache or visual changes.  Your symptoms persist after you have completed your treatment plan.  Make sure you  Understand these instructions.  Will watch your condition.  Will get help right away if you are not doing well or get worse.  Your e-visit answers were reviewed by a board certified advanced clinical practitioner to complete your personal care plan.  Depending on the condition, your plan could have included both over the counter or prescription medications.  If there is a problem please reply  once you have received a response from your provider.  Your safety is important to us.  If you have drug allergies check your prescription carefully.    You can use MyChart to ask questions about today's visit, request a non-urgent call back, or ask for a work or school excuse for 24 hours related to this e-Visit. If it has been greater than 24 hours you will need to follow up with your provider, or enter a new e-Visit to address those concerns.  You will get an e-mail in the next two days asking about your experience.  I hope that your e-visit has been valuable and will speed your recovery. Thank you for  using e-visits.    

## 2017-10-19 ENCOUNTER — Ambulatory Visit: Payer: 59 | Admitting: Nurse Practitioner

## 2017-10-19 ENCOUNTER — Encounter: Payer: Self-pay | Admitting: Nurse Practitioner

## 2017-10-19 VITALS — BP 119/85 | HR 96 | Temp 97.2°F | Ht 61.0 in | Wt 137.0 lb

## 2017-10-19 DIAGNOSIS — F988 Other specified behavioral and emotional disorders with onset usually occurring in childhood and adolescence: Secondary | ICD-10-CM | POA: Diagnosis not present

## 2017-10-19 DIAGNOSIS — F5101 Primary insomnia: Secondary | ICD-10-CM

## 2017-10-19 DIAGNOSIS — L700 Acne vulgaris: Secondary | ICD-10-CM | POA: Diagnosis not present

## 2017-10-19 DIAGNOSIS — Z8669 Personal history of other diseases of the nervous system and sense organs: Secondary | ICD-10-CM | POA: Diagnosis not present

## 2017-10-19 MED ORDER — ZOLPIDEM TARTRATE ER 12.5 MG PO TBCR: 12.5000 mg | EXTENDED_RELEASE_TABLET | Freq: Every evening | ORAL | 2 refills | Status: DC | PRN

## 2017-10-19 MED FILL — ZOLPIDEM TART ER 12.5 MG TA: 12.5 | 30 days supply | Qty: 30 | Fill #0

## 2017-10-19 NOTE — Progress Notes (Signed)
   Subjective:    Patient ID: Erica Rodriguez, female    DOB: 01-Dec-1985, 32 y.o.   MRN: [id number]  HPI Patient in today c/o trouble sleeping. She takes [place] and she can fall asleep but she only sleep about 3 hours and then she is restless. It has been going on fro awhile but has not had tome to make appointment. She says she doe snot snore. We have tried belsomra in the past which did not help.    Review of Systems  Constitutional: Negative.   Respiratory: Negative.   Cardiovascular: Negative.   Neurological: Negative.   Psychiatric/Behavioral: Positive for sleep disturbance.  All other systems reviewed and are negative.      Objective:   Physical Exam  Constitutional: She is oriented to person, place, and time. She appears well-developed and well-nourished.  Cardiovascular: Normal rate and regular rhythm.  Pulmonary/Chest: Effort normal and breath sounds normal.  Neurological: She is alert and oriented to person, place, and time.  Skin: Skin is warm.  Psychiatric: She has a normal mood and affect. Her behavior is normal. Judgment and thought content normal.    BP 119/85   Pulse 96   Temp (!) 97.2 F (36.2 C) (Oral)   Ht 5\' 1"  (1.549 m)   Wt 137 lb (62.1 kg)   BMI 25.89 kg/m       Assessment & Plan:   1. Primary insomnia    Meds ordered this encounter  Medications  . zolpidem (AMBIEN CR) 12.5 MG CR tablet    Sig: Take 1 tablet (12.5 mg total) by mouth at bedtime as needed for sleep.    Dispense:  30 tablet    Refill:  2    Order Specific Question:   Supervising Provider    Answer:   Johna SheriffVINCENT, CAROL L [[id number]]   Bedtime routine Follow up prn  [name] Daphine DeutscherMartin, FNP

## 2017-10-19 NOTE — Patient Instructions (Signed)

## 2017-10-28 ENCOUNTER — Encounter: Payer: Self-pay | Admitting: Nurse Practitioner

## 2017-10-29 ENCOUNTER — Encounter: Payer: Self-pay | Admitting: Family Medicine

## 2017-10-29 ENCOUNTER — Ambulatory Visit: Payer: 59 | Admitting: Family Medicine

## 2017-10-29 ENCOUNTER — Other Ambulatory Visit: Payer: Self-pay | Admitting: Nurse Practitioner

## 2017-10-29 VITALS — BP 120/82 | HR 100 | Ht 61.0 in | Wt 137.2 lb

## 2017-10-29 DIAGNOSIS — G43909 Migraine, unspecified, not intractable, without status migrainosus: Secondary | ICD-10-CM | POA: Diagnosis not present

## 2017-10-29 DIAGNOSIS — R110 Nausea: Secondary | ICD-10-CM

## 2017-10-29 MED ORDER — ZOLPIDEM TARTRATE 10 MG PO TABS: 10.0000 mg | ORAL_TABLET | Freq: Every evening | ORAL | 0 refills | Status: DC | PRN

## 2017-10-29 MED ORDER — ONDANSETRON 4 MG PO TBDP: 4.0000 mg | ORAL_TABLET | Freq: Three times a day (TID) | ORAL | 0 refills | Status: DC | PRN

## 2017-10-29 MED ORDER — KETOROLAC TROMETHAMINE 60 MG/2ML IM SOLN
60.0000 mg | Freq: Once | INTRAMUSCULAR | Status: AC
  Administered 2017-10-29: 60 mg via INTRAMUSCULAR

## 2017-10-29 MED FILL — RIZATRIPTAN 10 MG ODT: 10 | 30 days supply | Qty: 9 | Fill #2

## 2017-10-29 MED FILL — EMGALITY 120 MG/ML SOAJ: 120 | 30 days supply | Qty: 1 | Fill #4

## 2017-10-29 NOTE — Progress Notes (Signed)
   Subjective:    Patient ID: Erica Rodriguez, female    DOB: Jul 29, 1985, 32 y.o.   MRN: [id number]  HPI Chief Complaint  Patient presents with  . Migraine   She is here with complaints of a migraine headache for the past 2 days. This feels like one of her usual migraines. Pain is behind her right eye and radiates down her right neck which is usual. Requests a Toradol injection. States she occasionally has to get these to help break her migraine.  She has nausea and photosensitivity.   She is on Topamax. Has taken Maxalt and also takes Emgality monthly  She also took ibuprofen this morning.   Denies fever, chills, chest pain, palpitations, abdominal pain, vomiting.   Reviewed allergies, medications, past medical, surgical, family, and social history.   Review of Systems Pertinent positives and negatives in the history of present illness.     Objective:   Physical Exam BP 120/82   Pulse 100   Ht 5\' 1"  (1.549 m)   Wt 137 lb 3.2 oz (62.2 kg)   SpO2 99%   BMI 25.92 kg/m   Alert and oriented and in no distress.  Pharyngeal area is normal. Neck is supple without adenopathy or thyromegaly. Cardiac exam shows a regular sinus rhythm without murmurs or gallops. Lungs are clear to auscultation. PERRLA, EOMs intact. CN II-IX intact. Skin is warm and dry, no pallor.        Assessment & Plan:  Migraine without status migrainosus, not intractable, unspecified migraine type - Plan: ondansetron (ZOFRAN ODT) 4 MG disintegrating tablet, ketorolac (TORADOL) injection 60 mg  Nausea - Plan: ondansetron (ZOFRAN ODT) 4 MG disintegrating tablet  Normal neuro exam.  Toradol 60 mg injection given IM.  Rest and push fluids.  Follow up with her PCP or neurologist.

## 2017-10-29 NOTE — Telephone Encounter (Signed)
[facility]: Emgality needs PA. [facility], spoke with Lupita LeashDonna and advised her the PA was approved on 10/12/17. She stated that when she ran Rx through it stated "PA required". This RN stated will fax PA approval to pharmacy. Lupita LeashDonna verbalized understanding, appreciation. [facility]

## 2017-11-06 ENCOUNTER — Encounter: Payer: Self-pay | Admitting: Nurse Practitioner

## 2017-11-06 ENCOUNTER — Ambulatory Visit: Payer: 59 | Admitting: Nurse Practitioner

## 2017-11-06 VITALS — BP 119/85 | HR 84 | Temp 97.7°F | Ht 61.0 in | Wt 136.0 lb

## 2017-11-06 DIAGNOSIS — G43111 Migraine with aura, intractable, with status migrainosus: Secondary | ICD-10-CM | POA: Diagnosis not present

## 2017-11-06 MED ORDER — AMITRIPTYLINE HCL 25 MG PO TABS: 25.0000 mg | ORAL_TABLET | Freq: Every day | ORAL | 3 refills | Status: DC

## 2017-11-06 MED ORDER — KETOROLAC TROMETHAMINE 60 MG/2ML IM SOLN
60.0000 mg | Freq: Once | INTRAMUSCULAR | Status: AC
  Administered 2017-11-06: 60 mg via INTRAMUSCULAR

## 2017-11-06 NOTE — Patient Instructions (Signed)

## 2017-11-06 NOTE — Progress Notes (Signed)
   Subjective:    Patient ID: Erica Rodriguez, female    DOB: 1986-05-16, 32 y.o.   MRN: [id number]   Chief Complaint: Migraine (2 weeks)   HPI patient come sin today still c/o headache. She was seen on 10/29/17 with same complaint and was given a toradol shot by NP at work. The toradol ease pain for a little but came right back next day. Pain is mainly on right side of face with tightness in neck. She has been taking maxalt with  No relief. She is already on topamax fr prevention. She inks her stress is adding to headache. She has had some nausea with headache. The only thing that has worked in the past is amitriptyline. Would like to go back on that.    Review of Systems  Constitutional: Negative.   HENT: Negative.   Eyes: Positive for photophobia.  Respiratory: Negative.   Cardiovascular: Negative.   Genitourinary: Negative.   Neurological: Negative.   Psychiatric/Behavioral: Negative.   All other systems reviewed and are negative.      Objective:   Physical Exam  Constitutional: She is oriented to person, place, and time. She appears well-developed and well-nourished. She appears distressed (moderate).  Cardiovascular: Normal rate and regular rhythm.  Pulmonary/Chest: Effort normal.  Neurological: She is alert and oriented to person, place, and time. She displays normal reflexes. No cranial nerve deficit.  Skin: Skin is warm and dry.  Psychiatric: She has a normal mood and affect. Her behavior is normal. Judgment and thought content normal.   BP 119/85   Pulse 84   Temp 97.7 F (36.5 C) (Oral)   Ht  (1.549 m)   Wt 136 lb (61.7 kg)   BMI 25.70 kg/m        Assessment & Plan:  Erica Rodriguez in today with chief complaint of Migraine (2 weeks)   1. Intractable migraine with aura with status migrainosus Rest Ice to forehead RTO prn - ketorolac (TORADOL) injection 60 mg - amitriptyline (ELAVIL) 25 MG tablet; Take 1 tablet (25 mg total) by mouth at bedtime.   Dispense: 30 tablet; Refill: 3  [name], FNP

## 2017-11-24 ENCOUNTER — Encounter: Payer: Self-pay | Admitting: Diagnostic Neuroimaging

## 2017-11-24 ENCOUNTER — Ambulatory Visit: Payer: 59 | Admitting: Diagnostic Neuroimaging

## 2017-11-24 DIAGNOSIS — R110 Nausea: Secondary | ICD-10-CM

## 2017-11-24 DIAGNOSIS — G43909 Migraine, unspecified, not intractable, without status migrainosus: Secondary | ICD-10-CM

## 2017-11-24 DIAGNOSIS — G43111 Migraine with aura, intractable, with status migrainosus: Secondary | ICD-10-CM | POA: Diagnosis not present

## 2017-11-24 MED ORDER — RIZATRIPTAN BENZOATE 10 MG PO TBDP: 10.0000 mg | ORAL_TABLET | ORAL | 11 refills | Status: DC | PRN

## 2017-11-24 MED ORDER — AMITRIPTYLINE HCL 10 MG PO TABS: 10.0000 mg | ORAL_TABLET | Freq: Every day | ORAL | 6 refills | Status: DC

## 2017-11-24 MED ORDER — ONDANSETRON 4 MG PO TBDP: 4.0000 mg | ORAL_TABLET | Freq: Three times a day (TID) | ORAL | 3 refills | Status: DC | PRN

## 2017-11-24 MED ORDER — ERENUMAB-AOOE 140 MG/ML ~~LOC~~ SOAJ: 140.0000 mg | SUBCUTANEOUS | 3 refills | Status: DC

## 2017-11-24 MED FILL — AMITRIPTYLINE HCL 10 MG TAB: 10 | 30 days supply | Qty: 60 | Fill #0

## 2017-11-24 MED FILL — ONDANSETRON ODT 4 MG TABLET: 4 | 10 days supply | Qty: 30 | Fill #0

## 2017-11-24 MED FILL — RIZATRIPTAN 10 MG ODT: 10 | 30 days supply | Qty: 9 | Fill #0

## 2017-11-24 NOTE — Progress Notes (Signed)
[facility]  PATIENT: Erica Rodriguez DOB: 08-17-1985  REFERRING CLINICIAN:  HISTORY FROM: patient  REASON FOR VISIT: follow up   HISTORICAL  CHIEF COMPLAINT:  Chief Complaint  Patient presents with  . Follow-up  . Migraine    Has been seen 4-25 and 11-06-17 for migraine (toradol and amitriptyline). Emagality not lasting 4wks.  Uses [facility] if rx after 5p.    HISTORY OF PRESENT ILLNESS:   UPDATE (11/24/17, VRP): Since last visit, doing better with HA --> averaging 7-12 HA per month. Tolerating emgality (seems to help for 3 weeks after each shot). Now back on amitriptyline  daily and HA are better. No alleviating or aggravating factors. Has reduced caffeine.   UPDATE (05/22/17, VRP): Since last visit, doing about the same; averaging 15+ days headache per month. Tolerating meds. No alleviating or aggravating factors.   UPDATE 02/19/17 (MM): "Ms. Erica Rodriguez is a 32 year old female with a history of migraine headaches. She returns today for follow-up. At the last visit Topamax was increased to 100 mg twice a day. She reports starting in June she had a constant headache for approximately 3 weeks. She reports that she continuously has a dull headache. Occasionally this will advance to a severe headache. She reports that her headaches typically occur on the right side. She does have photophobia, phonophobia and nausea. She states that her headaches have continued to be daily throughout the month of July. She reports that she may have had only one headache free day. She is tried amitriptyline in the past however this caused constipation. She is also tried Imitrex without benefit. She has tried over-the-counter medications such as Advil and Aleve. She does report that she does drink caffeine throughout the day. She states that she has tried to cut back but reports that she "loves caffeine." In the past she has had a prednisone Dosepak as well as toradol for her headaches. Both have been  beneficial. The patient states that she was recently diagnosed with a urinary tract infection. She started Keflex today. Patient reports that she does have high anxiety and stress levels. She reports that her family also reports that she is "high strung." She returns today for an evaluation."  UPDATE 11/11/16: Since last visit, tried amitriptyline, x 2 months, then stopped due to side effects. Tolerating TPX. Headaches are slightly better. Sleep is better. Less caffeine.   UPDATE 08/12/16: Since last visit, HA have worsened. Lately having more cravings for food and soda (2-3 per day). More anxiety and insomnia issues.   UPDATE 02/05/16: Since last visit having 2-3 migraine per month. Some mild hair loss. Also with right TMJ pain in April 2017, then June 2017. Had tx with PCP (toradol, prednisone, flexeril), and now improved.   UPDATE 08/07/15: Since last visit, HA are improved. Now having 3-4 HA per month. Cambia helping for rescue medication.   UPDATE 04/04/15: Since last visit, has continued with 6-7 mild HA per month; and occ severe right retroorbital severe HA, with nausea, sens to light; some "messed up" vision and spots before some HA. Dr. Cathey Endow ophthalmology eval'd patient, and no sig papilledema noted. MRI brain and labs unremarkable. Tried imitrex per PCP and had hot feeling, anxiety and flushing, with minimal benefit. Still not sleeping well at night, in spite of nightly ambien.   PRIOR HPI (12/13/14): 32 year old right-handed female here for evaluation of headache, eye pain, swollen optic nerves, retro-orbital pain. One and a half months ago patient noticed right-sided headache, with pressure and  pulsating pushing sensation behind her right eye. She had some sensitivity to light, nausea, without phonophobia or vomiting. Initially she thought she was pregnant and took a home pregnancy test which was negative. Symptoms have been constant since that time. She has chronic insomnia and has been on Ambien for  several years. She has been on minocycline and trentinon cream for past 6 months. Patient went to optometrist for evaluation was found to have mild optic nerve head edema bilaterally.   REVIEW OF SYSTEMS: Full 14 system review of systems performed and negative except: headache insomnia.   ALLERGIES: No Known Allergies  HOME MEDICATIONS: Outpatient Medications Prior to Visit  Medication Sig Dispense Refill  . acetaminophen (TYLENOL) 500 MG tablet Take 500 mg by mouth every 6 (six) hours as needed.    Marland Kitchen amitriptyline (ELAVIL) 25 MG tablet Take 1 tablet (25 mg total) by mouth at bedtime. 30 tablet 3  . cyclobenzaprine (FLEXERIL) 5 MG tablet Take 1 tablet (5 mg total) by mouth 3 (three) times daily as needed for muscle spasms. 60 tablet 2  . fluticasone (FLONASE) 50 MCG/ACT nasal spray Place 2 sprays into both nostrils daily. 16 g 6  . Galcanezumab-gnlm (EMGALITY) 120 MG/ML SOAJ Inject into the skin.    Marland Kitchen MIRENA, 52 MG, 20 MCG/24HR IUD     . ondansetron (ZOFRAN ODT) 4 MG disintegrating tablet Take 1 tablet (4 mg total) by mouth every 8 (eight) hours as needed for nausea or vomiting. 20 tablet 0  . rizatriptan (MAXALT-MLT) 10 MG disintegrating tablet Take 1 tablet (10 mg total) as needed by mouth for migraine. May repeat in 2 hours if needed 9 tablet 11  . topiramate (TOPAMAX) 100 MG tablet Take 1 tablet (100 mg total) 2 (two) times daily by mouth. 180 tablet 4  . zolpidem (AMBIEN) 10 MG tablet Take 1 tablet (10 mg total) by mouth at bedtime as needed for sleep. 30 tablet 0   Facility-Administered Medications Prior to Visit  Medication Dose Route Frequency Provider Last Rate Last Dose  . betamethasone acetate-betamethasone sodium phosphate (CELESTONE) injection 6 mg  6 mg Intramuscular Once Mechele Claude, MD        PAST MEDICAL HISTORY: Past Medical History:  Diagnosis Date  . Depression   . Headache    migraines  . Mild preeclampsia     PAST SURGICAL HISTORY: Past Surgical History:   Procedure Laterality Date  . CESAREAN SECTION    . FOOT SURGERY Bilateral   . WISDOM TOOTH EXTRACTION      FAMILY HISTORY: Family History  Problem Relation Age of Onset  . Thyroid disease Mother   . Hyperlipidemia Mother   . Arthritis/Rheumatoid Mother   . Diabetes Mother   . Hypertension Father   . Kidney disease Father   . Cirrhosis Father   . Diabetes Maternal Grandmother   . Stroke Maternal Grandmother   . Stroke Maternal Grandfather     SOCIAL HISTORY:  Social History   Socioeconomic History  . Marital status: Married    Spouse name: Casimiro Needle  . Number of children: 1  . Years of education: 5  . Highest education level: Not on file  Occupational History    Comment: clincal registration  Social Needs  . Financial resource strain: Not on file  . Food insecurity:    Worry: Not on file    Inability: Not on file  . Transportation needs:    Medical: Not on file    Non-medical: Not on file  Tobacco Use  . Smoking status: Never Smoker  . Smokeless tobacco: Never Used  Substance and Sexual Activity  . Alcohol use: No  . Drug use: No  . Sexual activity: Yes  Lifestyle  . Physical activity:    Days per week: Not on file    Minutes per session: Not on file  . Stress: Not on file  Relationships  . Social connections:    Talks on phone: Not on file    Gets together: Not on file    Attends religious service: Not on file    Active member of club or organization: Not on file    Attends meetings of clubs or organizations: Not on file    Relationship status: Not on file  . Intimate partner violence:    Fear of current or ex partner: Not on file    Emotionally abused: Not on file    Physically abused: Not on file    Forced sexual activity: Not on file  Other Topics Concern  . Not on file  Social History Narrative   Lives at home with husband and son   Drinks 1 soda a day      PHYSICAL EXAM  Vitals:   11/24/17 1112  BP: 127/79  Pulse: (!) 102  Weight:  140 lb (63.5 kg)  Height:  (1.549 m)   Wt Readings from Last 6 Encounters:  11/24/17 140 lb (63.5 kg)  11/06/17 136 lb (61.7 kg)  10/29/17 137 lb 3.2 oz (62.2 kg)  10/19/17 137 lb (62.1 kg)  09/17/17 137 lb (62.1 kg)  09/14/17 139 lb 6.4 oz (63.2 kg)   Body mass index is 25.7 kg/m.  No exam data present  No flowsheet data found.  GENERAL EXAM: Patient is in no distress; well developed, nourished and groomed; neck is supple  CARDIOVASCULAR: Regular rate and rhythm, no murmurs, no carotid bruits  NEUROLOGIC: MENTAL STATUS: awake, alert, language fluent, comprehension intact, naming intact, fund of knowledge appropriate CRANIAL NERVE: no papilledema on fundoscopic exam, pupils equal and reactive to light, visual fields full to confrontation, extraocular muscles intact, no nystagmus, facial sensation and strength symmetric, hearing intact, palate elevates symmetrically, uvula midline, shoulder shrug symmetric, tongue midline. MOTOR: normal bulk and tone, full strength in the BUE, BLE SENSORY: normal and symmetric to light touch, temperature, vibration  COORDINATION: finger-nose-finger, fine finger movements normal REFLEXES: deep tendon reflexes present and symmetric GAIT/STATION: narrow based gait    DIAGNOSTIC DATA (LABS, IMAGING, TESTING) - I reviewed patient records, labs, notes, testing and imaging myself where available.  Lab Results  Component Value Date   WBC 5.9 12/11/2016   HGB 13.5 12/11/2016   HCT 40.3 12/11/2016   MCV 97 12/11/2016   PLT 378 12/11/2016      Component Value Date/Time   NA 143 12/11/2016 0917   K 4.5 12/11/2016 0917   CL 108 (H) 12/11/2016 0917   CO2 20 12/11/2016 0917   GLUCOSE 82 12/11/2016 0917   BUN 11 12/11/2016 0917   CREATININE 0.67 12/11/2016 0917   CALCIUM 9.3 12/11/2016 0917   PROT 7.3 12/11/2016 0917   ALBUMIN 4.6 12/11/2016 0917   AST 14 12/11/2016 0917   ALT 10 12/11/2016 0917   ALKPHOS 83 12/11/2016 0917   BILITOT  0.4 12/11/2016 0917   GFRNONAA 118 12/11/2016 0917   GFRAA 135 12/11/2016 0917   No results found for: CHOL, HDL, LDLCALC, LDLDIRECT, TRIG, CHOLHDL Lab Results  Component Value Date   HGBA1C 5.2  08/12/2016   Lab Results  Component Value Date   VITAMINB12 249 12/11/2016   Lab Results  Component Value Date   TSH 1.830 12/11/2016    12/13/14 MRI brain [I reviewed images myself and agree with interpretation. -VRP]  - normal    ASSESSMENT AND PLAN  32 y.o. year old female here with new onset headache, blurred vision, possible optic nerve edema (but later found to be normal), since April 2016. MRI, labs, and eye exam are unremarkable. Likely represents migraine with aura. Also with chronic insomnia and mild anxiety symptoms.  Meds tried and failed: amitriptyline, imitrex, toradol, flexeril, topiramate, gabapentin, sertraline, gabapentin   Dx:   Intractable migraine with aura with status migrainosus - Plan: amitriptyline (ELAVIL) 10 MG tablet  Migraine without status migrainosus, not intractable, unspecified migraine type - Plan: ondansetron (ZOFRAN ODT) 4 MG disintegrating tablet  Nausea - Plan: ondansetron (ZOFRAN ODT) 4 MG disintegrating tablet    PLAN:  MIGRAINE PREVENTION - continue topiramate  twice a day; for migraine prevention - try changing emgality to aimovig for migraine prevention - reduce amitripyline to 10-20mg  daily for migraine prevention - may consider botox in future (will have patient see Ethelene Browns, NP next visit)  MIGRAINE RESCUE - ibuprofen, tylenol - cambia as needed  - rizatriptan as needed  INSOMNIA / ANXIETY (improved) - monitor for now; may consider psychiatry / psychology follow up in future   Meds ordered this encounter  Medications  . amitriptyline (ELAVIL) 10 MG tablet    Sig: Take 1-2 tablets (10-20 mg total) by mouth at bedtime.    Dispense:  60 tablet    Refill:  6  . Erenumab-aooe (AIMOVIG) 140 MG/ML SOAJ    Sig: Inject 140  mg into the skin every 30 (thirty) days.    Dispense:  3 pen    Refill:  3  . ondansetron (ZOFRAN ODT) 4 MG disintegrating tablet    Sig: Take 1 tablet (4 mg total) by mouth every 8 (eight) hours as needed for nausea or vomiting.    Dispense:  30 tablet    Refill:  3  . rizatriptan (MAXALT-MLT) 10 MG disintegrating tablet    Sig: Take 1 tablet (10 mg total) by mouth as needed for migraine. May repeat in 2 hours if needed    Dispense:  9 tablet    Refill:  11   Return in about 6 months (around 05/27/2018) for with NP Ethelene Browns).    Suanne Marker, MD 11/24/2017, 11:30 AM Certified in Neurology, Neurophysiology and Neuroimaging  [facility] [address], [address] [place], [place] [address] [phone]

## 2017-11-25 ENCOUNTER — Ambulatory Visit: Payer: 59 | Admitting: Physician Assistant

## 2017-11-25 ENCOUNTER — Encounter: Payer: Self-pay | Admitting: Physician Assistant

## 2017-11-25 VITALS — BP 114/80 | HR 96 | Temp 97.1°F | Ht 61.0 in | Wt 137.5 lb

## 2017-11-25 DIAGNOSIS — S0501XA Injury of conjunctiva and corneal abrasion without foreign body, right eye, initial encounter: Secondary | ICD-10-CM | POA: Diagnosis not present

## 2017-11-25 MED ORDER — TOBRAMYCIN 0.3 % OP SOLN: 2.0000 [drp] | Freq: Four times a day (QID) | OPHTHALMIC | 0 refills | Status: DC

## 2017-11-25 NOTE — Patient Instructions (Signed)
Corneal Abrasion A corneal abrasion is a scratch or injury to the clear covering over the front of your eye (cornea). This can be painful. It is important to get treatment for a corneal abrasion. If this problem is not treated, it can affect your eyesight (vision). Follow these instructions at home: Medicines  Use eye drops or ointments as told by your doctor.  If you were prescribed antibiotic drops or ointment, use them as told by your doctor. Do not stop using the antibiotic even if you start to feel better.  Take over-the-counter and prescription medicines only as told by your doctor.  Do not drive or use heavy machinery while taking prescription pain medicine. General instructions  If you have an eye patch, wear it as told by your doctor. ? Do not drive or use machinery while wearing an eye patch. ? Follow instructions from your doctor about when to take off the patch.  Ask your doctor if you can use a cold, wet cloth (compress) on your eye to help with pain.  Do not rub or touch your eye. Do not wash out your eye.  Do not wear contact lenses until your doctor says that this is okay.  Avoid bright light.  Avoid straining your eyes.  Keep all follow-up visits as told by your doctor. Doing this can help to prevent infection and loss of eyesight. Contact a doctor if:  You continue to have eye pain and other symptoms for more than 2 days.  You get new symptoms, such as: ? Redness. ? Watery eyes (tearing). ? Discharge.  You have discharge that makes your eyelids stick together in the morning.  Symptoms come back after your eye heals. Get help right away if:  You have very bad eye pain that does not get better with medicine.  You lose eyesight. Summary  A corneal abrasion is a scratch or injury to the clear covering over the front of your eye (cornea).  It is important to get treatment for a corneal abrasion. If this problem is not treated, it can affect your eyesight  (vision).  Use eye drops or ointments as told by your doctor.  If you have an eye patch, do not drive or use machinery while wearing it. This information is not intended to replace advice given to you by your health care provider. Make sure you discuss any questions you have with your health care provider. Document Released: 12/10/2007 Document Revised: 06/07/2016 Document Reviewed: 06/07/2016 Elsevier Interactive Patient Education  2017 Elsevier Inc.  

## 2017-11-25 NOTE — Progress Notes (Signed)
  Subjective:     Patient ID: Erica Rodriguez, female   DOB: [birth year], 32 y.o.   MRN: [id number]  HPI Pt with irritation to the R eye since this am States she slept in her contacts last pm and when she awoke this am felt some discomfort to the R eye She then removed lenses and now wearing glasses   Review of Systems  No change in vision No watery discharge + photophobia     Objective:   Physical Exam PERRLA EOMI No lid edema noted to he R eye + clear watery discharge No matting noted Fundo - nl Fluro stain of the eye shows a abrasion at the  2 o'clock position Eye irrigated No pre auric nodes    Assessment:     1. Abrasion of right cornea, initial encounter        Plan:     Nl course reviewed No contacts x 1 week Tobrex rx Try to keep from sleeping in contacts Work note F/u prn

## 2017-11-26 ENCOUNTER — Telehealth: Payer: Self-pay | Admitting: *Deleted

## 2017-11-26 NOTE — Telephone Encounter (Signed)
I initiated on cover my meds Key [id number] PA for aimovig.

## 2017-12-01 MED FILL — AIMOVIG 140 MG/ML SOAJ: 140 | 30 days supply | Qty: 1 | Fill #0

## 2017-12-01 NOTE — Telephone Encounter (Signed)
PA approval for aimovig  11-27-17 thru 04-28-18.  (  /28ml / 30 days). [employer]. PA ref # [id number].   For renewal requires that pt has experinenced a reduction in migraine or headache frequency of at least 2 days per month. Or reduction in migraine severity or migraine duration with aimovig therapy.

## 2017-12-01 NOTE — Telephone Encounter (Signed)
Fax confirmation received to pharmacy [facility]).

## 2017-12-02 DIAGNOSIS — G4730 Sleep apnea, unspecified: Secondary | ICD-10-CM | POA: Diagnosis not present

## 2017-12-04 MED FILL — TOPIRAMATE 100 MG TABLET: 100 | 90 days supply | Qty: 180 | Fill #2

## 2017-12-18 ENCOUNTER — Encounter: Payer: Self-pay | Admitting: Diagnostic Neuroimaging

## 2017-12-18 ENCOUNTER — Telehealth: Payer: Self-pay | Admitting: *Deleted

## 2017-12-18 ENCOUNTER — Encounter: Payer: Self-pay | Admitting: Family Medicine

## 2017-12-18 ENCOUNTER — Ambulatory Visit: Payer: 59 | Admitting: Family Medicine

## 2017-12-18 VITALS — BP 110/70 | HR 80 | Resp 16 | Ht 61.5 in | Wt 136.0 lb

## 2017-12-18 DIAGNOSIS — G43701 Chronic migraine without aura, not intractable, with status migrainosus: Secondary | ICD-10-CM | POA: Diagnosis not present

## 2017-12-18 MED ORDER — KETOROLAC TROMETHAMINE 60 MG/2ML IM SOLN
60.0000 mg | Freq: Once | INTRAMUSCULAR | Status: AC
  Administered 2017-12-18: 60 mg via INTRAMUSCULAR

## 2017-12-18 NOTE — Progress Notes (Signed)
   Subjective:    Patient ID: Erica Rodriguez, female    DOB: 05/13/1986, 32 y.o.   MRN: [id number]  HPI Chief Complaint  Patient presents with  . headache    headache. would like a shot of toradol   She is here with complaints of chronic migraine that she occasionally has to get a Toradol injection to break her headache.  Associated nausea no vomiting.  She reports having a constant right sided headache since May 28th. She has been taking amitriptyline nightly, this is prescribed by her neurologist and a new medication for her.  She is also taking Maxalt and Topamax.   Denies fever, chills, vision changes, chest pain, palpitations, abdominal pain, vomiting.   Reviewed allergies, medications, past medical, surgical, family, and social history.    Review of Systems Pertinent positives and negatives in the history of present illness.     Objective:   Physical Exam BP 110/70   Pulse 80   Resp 16   Ht 5' 1.5" (1.562 m)   Wt 136 lb (61.7 kg)   SpO2 99%   BMI 25.28 kg/m   Alert and in no distress. Pharyngeal area is normal. Neck is supple without adenopathy. Cardiac exam shows a regular sinus rhythm without murmurs or gallops. Lungs are clear to auscultation. PERRLA, EOMs intact. CN II-IX intact. Skin warm and dry, no pallor.       Assessment & Plan:  Chronic migraine without aura with status migrainosus, not intractable - Plan: ketorolac (TORADOL) injection 60 mg  She is unable to see her neurologist today. This appears to be her typical migraine headache.  Toradol 60 mg injection given.  Follow up with neurologist.

## 2017-12-18 NOTE — Telephone Encounter (Signed)
My chart message: Good Morning,  I am still having headaches I took the new headache shot on the 28th of may and ever since I took It my head has hurt, its been a everyday thing, its just a constant headache, and I've been taking 2 of the amitriptyline every night, and I've been watching what I eat and still no caffeine, I don't if something in the shot caused all of this, I had to take more of the melt away headache pills than normal to try and help and it get rids of them for a little but then they come right back, i didn't feel this bad on the other shot, i could at least go a few days without headaches, i don't know which way to go, i know i have felt horrible since taking this shot, i know i took the shot and the very next day when i woke up my head was killing me and its been on ever since, any information would be great of what i should do im ok with going back to the other shot if if you think i should, i really don't want to do the botox yet, my number is [phone]    Called patient to inquire if she has been taking all migraine, headache meds as prescribed. She stated she has been. This RN stated that she can discuss an infusion for her with Dr Epimenio FootSater, work in dr today. The patient has received IM medications for headache at her PCP's office in the past, she stated. This RN advised her if Dr Epimenio FootSater orders infusion, she must arrive before office closes at noon. The patient is at work today, stated she doesn't get off till 12:30 and probably can't leave early. This RN advised she call her PCP to see if she can be seen this afternoon. Patient then stated that her PCP is open Sat mornings too. This RN advised she call back if she can come in today before closing. She verbalized understanding, appreciation.

## 2017-12-28 MED FILL — AIMOVIG 140 MG/ML SOAJ: 140 | 30 days supply | Qty: 1 | Fill #1

## 2017-12-31 ENCOUNTER — Ambulatory Visit: Payer: 59

## 2017-12-31 ENCOUNTER — Encounter: Payer: Self-pay | Admitting: Pediatrics

## 2017-12-31 ENCOUNTER — Ambulatory Visit: Payer: 59 | Admitting: Pediatrics

## 2017-12-31 VITALS — BP 125/85 | HR 86 | Temp 98.2°F | Ht 61.5 in | Wt 141.0 lb

## 2017-12-31 DIAGNOSIS — G43819 Other migraine, intractable, without status migrainosus: Secondary | ICD-10-CM

## 2017-12-31 MED ORDER — KETOROLAC TROMETHAMINE 60 MG/2ML IM SOLN
60.0000 mg | Freq: Once | INTRAMUSCULAR | Status: AC
Start: 2017-12-31 — End: 2017-12-31
  Administered 2017-12-31: 60 mg via INTRAMUSCULAR

## 2017-12-31 NOTE — Progress Notes (Signed)
  Subjective:   Patient ID: Erica Rodriguez, female    DOB: [birth year], 32 y.o.   MRN: [id number] CC: Migraine  HPI: Erica NettleShauna H Corvera is a 32 y.o. female   Took aimovig injection this morning. Started having slight headache last night. Migraine much worse today after injection.  Feels like regular migraine headaches, R side of face around eye starts hurting, no pain starting to go down into R side of her neck as her headaches usually do.     She took her rizatriptan this morning.  Did not help any.  Toradol has worked in the past, last got injection 2 weeks ago. Has not taken Zofran yet, has it at home. Next appointment to see neurology is in November.  Considering Botox injections.    Relevant past medical, surgical, family and social history reviewed. Allergies and medications reviewed and updated. Social History   Tobacco Use  Smoking Status Never Smoker  Smokeless Tobacco Never Used   ROS: Per HPI   Objective:    BP 125/85   Pulse 86   Temp 98.2 F (36.8 C) (Oral)   Ht 5' 1.5" (1.562 m)   Wt 141 lb (64 kg)   BMI 26.21 kg/m   Wt Readings from Last 3 Encounters:  12/31/17 141 lb (64 kg)  12/18/17 136 lb (61.7 kg)  11/25/17 137 lb 8 oz (62.4 kg)    Gen: NAD, alert, cooperative with exam, NCAT EYES: EOMI, no conjunctival injection, or no icterus CV: NRRR, normal S1/S2, no murmur, distal pulses 2+ b/l Resp: CTABL, no wheezes, normal WOB Ext: No edema, warm Neuro: Alert and oriented, sensation intact bilateral upper extremities, lower extremities.  Equal handgrip bilaterally.  coordination grossly normal   Assessment & Plan:  Blake DivineShauna was seen today for migraine.  Diagnoses and all orders for this visit:  Other migraine without status migrainosus, intractable Toradol injections have helped some with migraines in the past, will do IM 60 mg today, patient to take Maxalt and Zofran when she gets home.  Recently migraines have been getting harder to treat.  Monthly injections have  been worsening symptoms instead of improving them recently.  She has follow-up with neurology scheduled for November but may try to get in to be seen sooner.  Continue preventive medicines.   Follow up plan: As needed Rex Krasarol Vincent, MD [facility]

## 2018-01-01 ENCOUNTER — Other Ambulatory Visit: Payer: Self-pay | Admitting: Physician Assistant

## 2018-01-01 DIAGNOSIS — M62838 Other muscle spasm: Secondary | ICD-10-CM

## 2018-01-13 ENCOUNTER — Encounter: Payer: Self-pay | Admitting: Diagnostic Neuroimaging

## 2018-01-13 MED ORDER — FREMANEZUMAB-VFRM 225 MG/1.5ML ~~LOC~~ SOSY: 225.0000 mg | PREFILLED_SYRINGE | SUBCUTANEOUS | 4 refills | Status: DC

## 2018-01-13 MED FILL — RIZATRIPTAN 10 MG ODT: 10 | 30 days supply | Qty: 9 | Fill #1

## 2018-01-13 MED FILL — ONDANSETRON ODT 4 MG TABLET: 4 | 10 days supply | Qty: 30 | Fill #1

## 2018-01-14 MED FILL — AJOVY 225 MG/1.5ML SOSY: 225 | 30 days supply | Qty: 2 | Fill #0

## 2018-01-14 NOTE — Telephone Encounter (Signed)
Initiated CoverMyMeds key [id number] for ajovy.

## 2018-01-18 NOTE — Addendum Note (Signed)
Addended by: Guy BeginYOUNG, Coner Gibbard S on: 01/18/2018 11:48 AM   Modules accepted: Orders

## 2018-01-20 ENCOUNTER — Ambulatory Visit: Payer: 59 | Admitting: Family Medicine

## 2018-01-20 ENCOUNTER — Encounter: Payer: Self-pay | Admitting: Family Medicine

## 2018-01-20 VITALS — BP 120/70 | HR 97 | Temp 97.9°F

## 2018-01-20 DIAGNOSIS — G43819 Other migraine, intractable, without status migrainosus: Secondary | ICD-10-CM | POA: Diagnosis not present

## 2018-01-20 MED ORDER — KETOROLAC TROMETHAMINE 60 MG/2ML IM SOLN
60.0000 mg | Freq: Once | INTRAMUSCULAR | Status: AC
  Administered 2018-01-20: 60 mg via INTRAMUSCULAR

## 2018-01-20 NOTE — Progress Notes (Signed)
   Subjective:    Patient ID: Erica Rodriguez, female    DOB: 1986/04/14, 32 y.o.   MRN: [id number]  HPI Chief Complaint  Patient presents with  . headache    headache   She is here with complaints of migraine headache. This is not any different from her usual migraines. No new symptoms.  She is under the care of a headache specialist who recently stopped Aimovig and switching her to a new medication but she does not start this until later this month per patient. She has taken her usual preventive and abortive medications without relief. Occasionally she has to have a Toradol injection to break the headache. She is requesting one today.  Denies fever, chills, dizziness, vision changes, chest pain, palpitations, shortness of breath, abdominal pain, N/V/D.   Reviewed allergies, medications, past medical, surgical, family, and social history.    Review of Systems Pertinent positives and negatives in the history of present illness.     Objective:   Physical Exam BP 120/70   Pulse 97   Temp 97.9 F (36.6 C) (Oral)   SpO2 99%   Alert and in no distress.  Pharyngeal area is normal. Neck is supple without adenopathy or thyromegaly. Cardiac exam shows a regular sinus rhythm without murmurs or gallops. Lungs are clear to auscultation. PERRLA, EOMs intact, CN intact. Skin warm and dry, no rash or pallor.       Assessment & Plan:  Other migraine without status migrainosus, intractable - Plan: ketorolac (TORADOL) injection 60 mg  Current headache appears to be her usual migraine. Will give her a Toradol injection. She has done well in the past with this. She should follow up with her neurologist.

## 2018-02-15 MED FILL — AMITRIPTYLINE HCL 10 MG TAB: 10 | 30 days supply | Qty: 60 | Fill #1

## 2018-02-15 MED FILL — RIZATRIPTAN 10 MG ODT: 10 | 30 days supply | Qty: 9 | Fill #2

## 2018-02-15 MED FILL — AJOVY 225 MG/1.5ML SOSY: 225 | 30 days supply | Qty: 2 | Fill #1

## 2018-02-18 ENCOUNTER — Other Ambulatory Visit: Payer: Self-pay | Admitting: Nurse Practitioner

## 2018-02-18 MED ORDER — ZOLPIDEM TARTRATE 10 MG PO TABS: 10.0000 mg | ORAL_TABLET | Freq: Every evening | ORAL | 1 refills | Status: DC | PRN

## 2018-03-09 MED FILL — TOPIRAMATE 100 MG TABLET: 100 | 90 days supply | Qty: 180 | Fill #3

## 2018-03-15 ENCOUNTER — Telehealth: Payer: 59 | Admitting: Family

## 2018-03-15 DIAGNOSIS — L700 Acne vulgaris: Secondary | ICD-10-CM

## 2018-03-15 MED ORDER — DOXYCYCLINE HYCLATE 100 MG PO TABS: 100.0000 mg | ORAL_TABLET | Freq: Two times a day (BID) | ORAL | 1 refills | Status: DC

## 2018-03-15 MED ORDER — CLINDAMYCIN PHOS-BENZOYL PEROX 1-5 % EX GEL: Freq: Two times a day (BID) | CUTANEOUS | 0 refills | Status: DC

## 2018-03-15 NOTE — Progress Notes (Signed)
We are sorry that you are experiencing this issue.  Here is how we plan to help!  Based on what you shared with me it looks like you have cystic acne.  Acne is a disorder of the hair follicles and oil glands (sebaceous glands). The sebaceous glands secrete oils to keep the skin moist.  When the glands get clogged, it can lead to pimples or cysts.  These cysts may become infected and leave scars. Acne is very common and normally occurs at puberty.  Acne is also inherited.  Your personal care plan consists of the following recommendations:  I recommend that you use a daily cleanser  You might try an over the counter cleanser that has benzoyl peroxide.  I recommend that you start with a product that has 2.5% benzoyl peroxide.  Stronger concentrations have not been shown to be more effective.  I have prescribed a topical gel with an antibiotic:  Clindamycin-benzoyl peroxide gel.  This gel should be applied to the affected areas twice a day.  Be sure to read the package insert to understand potential side effects.  I have also prescribed one of the following additional therapies:  Doxycycline an oral antibiotic 100 mg twice a day  If excessive dryness or peeling occurs, reduce dose frequency or concentration of the topical scrubs.  If excessive stinging or burning occurs, remove the topical gel with mild soap and water and resume at a lower dose the next day.  Remember oral antibiotics and topical acne treatments may increase your sensitivity to the sun!  HOME CARE:  Do not squeeze pimples because that can often lead to infections, worse acne, and scars.  Use a moisturizer that contains retinoid or fruit acids that may inhibit the development of new acne lesions.  Although there is not a clear link that foods can cause acne, doctors do believe that too many sweets predispose you to skin problems.  GET HELP RIGHT AWAY IF:  If your acne gets worse or is not better within 10 days.  If you  become depressed.  If you become pregnant, discontinue medications and call your OB/GYN.  MAKE SURE YOU:  Understand these instructions.  Will watch your condition.  Will get help right away if you are not doing well or get worse.   Your e-visit answers were reviewed by a board certified advanced clinical practitioner to complete your personal care plan.  Depending upon the condition, your plan could have included both over the counter or prescription medications.  Please review your pharmacy choice.  If there is a problem, you may contact your provider through [employer] and have the prescription routed to another pharmacy.  Your safety is important to [place].  If you have drug allergies check your prescription carefully.  For the next 24 hours you can use MyChart to ask questions about today's visit, request a non-urgent call back, or ask for a work or school excuse from your e-visit provider.  You will get an email in the next two days asking about your experience. I hope that your e-visit has been valuable and will speed your recovery.

## 2018-03-16 MED FILL — DOXYCYCLINE HYCLATE 100 MG: 100 | 30 days supply | Qty: 60 | Fill #0

## 2018-03-16 MED FILL — CLINDAMYCIN PHOS-BENZOYL PE: 1-5 | 30 days supply | Qty: 25 | Fill #0

## 2018-03-17 ENCOUNTER — Telehealth: Payer: Self-pay | Admitting: *Deleted

## 2018-03-17 NOTE — Telephone Encounter (Signed)
Pt [employer] form on Sandy Y desk. 

## 2018-03-18 NOTE — Telephone Encounter (Addendum)
Pt called stating she has paid FMLA fee on 9/11 FYI

## 2018-03-18 NOTE — Telephone Encounter (Signed)
I contacted the patient and lvm (ok per DPR) advising we have received the [employer] form. Patient advised to call back so we can further discuss. FMLA payment has not been made at this time.  MB RN.

## 2018-03-18 NOTE — Telephone Encounter (Signed)
I contacted the patient and advised the forms have been completed and we are waiting on Dr. Marjory LiesPenumalli to provide his signature. Forms placed on his desk and copy of forms made.

## 2018-03-19 NOTE — Telephone Encounter (Signed)
[employer] FMLA papers signed, sent to medical records for processing.

## 2018-03-22 DIAGNOSIS — Z0289 Encounter for other administrative examinations: Secondary | ICD-10-CM

## 2018-03-23 ENCOUNTER — Telehealth: Payer: Self-pay | Admitting: Nurse Practitioner

## 2018-03-23 NOTE — Telephone Encounter (Addendum)
FMLA forms faxed to [employer] at [phone]. °

## 2018-03-24 NOTE — Telephone Encounter (Signed)
Pt aware, [facility]

## 2018-03-24 NOTE — Telephone Encounter (Signed)
LMTCB

## 2018-03-24 NOTE — Telephone Encounter (Signed)
Please advise on patient's request for change of medication.

## 2018-03-24 NOTE — Telephone Encounter (Signed)
These symptoms are very atypical of Doxycycline use.  However, if she feels that onset occurred after starting Doxycycline, ok to discontinue the doxycycline.  Continue using wash containing benzoyl peroxide. She can continue to use the topical gel, which contains an antibiotic.  Follow up with PCP if acne not improving with topical only.

## 2018-03-25 NOTE — Telephone Encounter (Signed)
Really needs to be seen.

## 2018-03-30 MED FILL — RIZATRIPTAN 10 MG ODT: 10 | 30 days supply | Qty: 9 | Fill #3

## 2018-03-30 MED FILL — AJOVY 225 MG/1.5ML SOSY: 225 | 30 days supply | Qty: 2 | Fill #2

## 2018-04-07 ENCOUNTER — Ambulatory Visit: Payer: 59 | Admitting: Family Medicine

## 2018-04-07 ENCOUNTER — Encounter: Payer: Self-pay | Admitting: Family Medicine

## 2018-04-07 VITALS — BP 120/80 | HR 89 | Temp 98.0°F | Resp 16

## 2018-04-07 DIAGNOSIS — G43701 Chronic migraine without aura, not intractable, with status migrainosus: Secondary | ICD-10-CM | POA: Diagnosis not present

## 2018-04-07 MED ORDER — KETOROLAC TROMETHAMINE 60 MG/2ML IM SOLN
60.0000 mg | Freq: Once | INTRAMUSCULAR | Status: AC
  Administered 2018-04-07: 60 mg via INTRAMUSCULAR

## 2018-04-07 NOTE — Progress Notes (Signed)
   Subjective:    Patient ID: Erica Rodriguez, female    DOB: 1985-11-10, 32 y.o.   MRN: [id number]  HPI Chief Complaint  Patient presents with  . headache    headache- seen last week. had injection last week for headaches. tension headache   She is here requesting Toradol injection for a 3 day history of headache. States this feels like her usual headaches and she is being treated with preventive and abortive medications but no resolution.  Pain is behind her right eye and radiating into her right jaw. Denies aura.  Under the care of of neurology for headaches. Currently taking topamax, Ajovy for prevention. States aimovig did not help.  She also reports trying Flexeril with minimal relief.   Denies fever, chills, dizziness, vision changes, chest pain, palpitations, shortness of breath, abdominal pain, N/V.  Has IUD for contraception.   Reviewed allergies, medications, past medical, surgical, family, and social history.    Review of Systems Pertinent positives and negatives in the history of present illness.     Objective:   Physical Exam BP 120/80   Pulse 89   Temp 98 F (36.7 C) (Oral)   Resp 16   SpO2 98%   Alert and oriented and in no distress. No sinus tenderness. Tympanic membranes and canals are normal. Pharyngeal area is normal. Neck is supple without adenopathy or thyromegaly. Cardiac exam shows a regular sinus rhythm without murmurs or gallops. Lungs are clear to auscultation. Skin is warm and dry. PERRLA, EOMs intact. CNs intact. No facial asymmetry or pronator drift. Negative Romberg.       Assessment & Plan:  Chronic migraine without aura with status migrainosus, not intractable - Plan: ketorolac (TORADOL) injection 60 mg  Toradol IM given. Tolerated this well.  Neuro exam in unremarkable.  Follow up with neurology.

## 2018-04-08 ENCOUNTER — Encounter: Payer: Self-pay | Admitting: Family Medicine

## 2018-04-08 ENCOUNTER — Ambulatory Visit: Payer: 59 | Admitting: Family Medicine

## 2018-04-08 VITALS — Temp 98.7°F

## 2018-04-08 DIAGNOSIS — G43819 Other migraine, intractable, without status migrainosus: Secondary | ICD-10-CM | POA: Diagnosis not present

## 2018-04-08 MED ORDER — KETOROLAC TROMETHAMINE 60 MG/2ML IM SOLN
60.0000 mg | Freq: Once | INTRAMUSCULAR | Status: AC
  Administered 2018-04-08: 60 mg via INTRAMUSCULAR

## 2018-04-08 NOTE — Progress Notes (Signed)
   Subjective:    Patient ID: Erica Rodriguez, female    DOB: May 15, 1986, 32 y.o.   MRN: [id number]  HPI No chief complaint on file.  She was seen yesterday for her usual migraine headache and given a Toradol injection. States she did not get much relief and requests a second injection today. States she took her abortive medication this morning. She has not been able to call and schedule with her neurologist or PCP today. Denies any new symptoms. No vision changes, numbness, tingling or weakness. Denies chest pain, N/V/D.    Review of Systems Pertinent positives and negatives in the history of present illness.     Objective:   Physical Exam Temp 98.7 F (37.1 C) (Oral)   Alert and oriented and in no acute distress. PERRLA, EOMs intact. Neck supple and full ROM. CNs intact. Skin is warm and dry.       Assessment & Plan:  Other migraine without status migrainosus, intractable - Plan: ketorolac (TORADOL) injection 60 mg  Will give her a Toradol injection 60 mg IM and discussed that she will need to see her PCP or neurologist for further treatment.

## 2018-04-09 ENCOUNTER — Telehealth: Payer: Self-pay | Admitting: *Deleted

## 2018-04-09 NOTE — Telephone Encounter (Signed)
Pt called and call transferred to [facility] for infusion.   Tina with [facility] did bring message to me.  I called pt.  She has had migraine since last week.  Neck/ head pain, photosensitivity,  level 8 today. She has never had infusion before.  Has taken her preventatives- ajovy, topamax, amitriptylline, and rescue-maxalt but has not helped.  She works at [employer], and was given toradol 60mg  IM the last 2 days and helped some, but come back.  She has no allergies, she is not pregnant.  She does not have driver.  Ok for depacon infusion? Please advise.  (spoke with Inetta Fermo and she has availability).

## 2018-04-09 NOTE — Telephone Encounter (Signed)
Per DR. Terrace Arabia, ok to come in for depacon.  Spoke to pt and she will come in at 1030.  No driver.  Orders given to [facility] in [facility].

## 2018-04-10 ENCOUNTER — Telehealth: Payer: 59 | Admitting: Physician Assistant

## 2018-04-10 DIAGNOSIS — G43919 Migraine, unspecified, intractable, without status migrainosus: Secondary | ICD-10-CM

## 2018-04-10 NOTE — Progress Notes (Signed)
Based on what you shared with me it looks like you have a serious condition that should be evaluated in a face to face office visit. I am concerned about the length of migraine headache without resolution. Can definitely be exacerbated by a sinus infection but a good physical exam and potentially imaging is needed, especially if you are still having significant issue and migraine after the Decadron infusion with neurology.   NOTE: If you entered your credit card information for this eVisit, you will not be charged. You may see a "hold" on your card for the $30 but that hold will drop off and you will not have a charge processed.  If you are having a true medical emergency please call 911.  If you need an urgent face to face visit, [facility] has four urgent care centers for your convenience.  If you need care fast and have a high deductible or no insurance consider:   [email or web address] to reserve your spot online an avoid wait times  [facility] [address] [place], [place] [address] 8 am to 8 pm Monday-Friday 10 am to 4 pm Saturday-Sunday *Across the street from [employer]  [address] [place], [address] 8 am to 5 pm Monday-Friday * In the [facility] on the [facility]   The following sites will take your  insurance:  . [facility]  [phone] Get Driving Directions Find a Provider at this Location  [address] [place], [place] [address] . 10 am to 8 pm Monday-Friday . 12 pm to 8 pm Saturday-Sunday   . [facility] at [facility]  [phone] Get Driving Directions Find a Provider at this Location  [address] [place], [place] [address] . 8 am to 8 pm Monday-Friday . 9 am to 6 pm Saturday . 11 am to 6 pm Sunday   . [facility] at [facility]  [phone] Get Driving Directions  [address] [place], [place] [address] . 8 am to 8 pm Monday-Friday . 8 am to 4 pm Saturday-Sunday   Your e-visit answers were reviewed by a board certified advanced clinical practitioner to complete your personal care plan.  Thank you for using e-Visits.

## 2018-04-16 DIAGNOSIS — H5213 Myopia, bilateral: Secondary | ICD-10-CM | POA: Diagnosis not present

## 2018-04-19 ENCOUNTER — Telehealth: Payer: Self-pay | Admitting: *Deleted

## 2018-04-19 NOTE — Telephone Encounter (Signed)
Received CMM PA request for Aimovig.  Patient is no longer on  Aimovig; switched to Ajovy in July 2019.

## 2018-04-28 MED FILL — AJOVY 225 MG/1.5ML SOSY: 225 | 30 days supply | Qty: 2 | Fill #3

## 2018-04-29 MED FILL — AMITRIPTYLINE HCL 10 MG TAB: 10 | 30 days supply | Qty: 60 | Fill #2

## 2018-05-26 ENCOUNTER — Telehealth: Payer: 59 | Admitting: Nurse Practitioner

## 2018-05-26 DIAGNOSIS — J0101 Acute recurrent maxillary sinusitis: Secondary | ICD-10-CM

## 2018-05-26 MED ORDER — AMOXICILLIN-POT CLAVULANATE 875-125 MG PO TABS: 1.0000 | ORAL_TABLET | Freq: Two times a day (BID) | ORAL | 0 refills | Status: DC

## 2018-05-26 MED FILL — RIZATRIPTAN 10 MG ODT: 10 | 30 days supply | Qty: 9 | Fill #4

## 2018-05-26 MED FILL — AJOVY 225 MG/1.5ML SOSY: 225 | 30 days supply | Qty: 2 | Fill #4

## 2018-05-26 MED FILL — AMOX-CLAV 875-125 MG TABLET: 875-125 | 7 days supply | Qty: 14 | Fill #0

## 2018-05-26 NOTE — Progress Notes (Signed)

## 2018-06-04 ENCOUNTER — Other Ambulatory Visit: Payer: Self-pay | Admitting: Diagnostic Neuroimaging

## 2018-06-04 ENCOUNTER — Other Ambulatory Visit: Payer: Self-pay | Admitting: Nurse Practitioner

## 2018-06-04 MED ORDER — ZOLPIDEM TARTRATE 10 MG PO TABS: 10.0000 mg | ORAL_TABLET | Freq: Every evening | ORAL | 1 refills | Status: DC | PRN

## 2018-06-07 ENCOUNTER — Ambulatory Visit: Payer: 59 | Admitting: Adult Health

## 2018-06-07 ENCOUNTER — Telehealth: Payer: 59 | Admitting: Family

## 2018-06-07 ENCOUNTER — Encounter: Payer: Self-pay | Admitting: Adult Health

## 2018-06-07 VITALS — BP 131/91 | HR 121 | Ht 61.5 in | Wt 142.6 lb

## 2018-06-07 DIAGNOSIS — R399 Unspecified symptoms and signs involving the genitourinary system: Secondary | ICD-10-CM | POA: Diagnosis not present

## 2018-06-07 DIAGNOSIS — G43009 Migraine without aura, not intractable, without status migrainosus: Secondary | ICD-10-CM | POA: Diagnosis not present

## 2018-06-07 MED ORDER — FREMANEZUMAB-VFRM 225 MG/1.5ML ~~LOC~~ SOSY: 225.0000 mg | PREFILLED_SYRINGE | SUBCUTANEOUS | 11 refills | Status: DC

## 2018-06-07 MED ORDER — TOPIRAMATE 100 MG PO TABS: 100.0000 mg | ORAL_TABLET | Freq: Two times a day (BID) | ORAL | 4 refills | Status: DC

## 2018-06-07 MED ORDER — CEPHALEXIN 500 MG PO CAPS: 500.0000 mg | ORAL_CAPSULE | Freq: Two times a day (BID) | ORAL | 0 refills | Status: DC

## 2018-06-07 MED ORDER — AMITRIPTYLINE HCL 10 MG PO TABS: 10.0000 mg | ORAL_TABLET | Freq: Every day | ORAL | 6 refills | Status: DC

## 2018-06-07 MED FILL — AMITRIPTYLINE HCL 10 MG TAB: 10 | 30 days supply | Qty: 60 | Fill #0

## 2018-06-07 MED FILL — TOPIRAMATE 100 MG TABLET: 100 | 90 days supply | Qty: 180 | Fill #0

## 2018-06-07 NOTE — Progress Notes (Signed)
Based on what you shared with me it looks like you have a serious condition that should be evaluated in a face to face office visit.  NOTE: If you entered your credit card information for this eVisit, you will not be charged. You may see a "hold" on your card for the $30 but that hold will drop off and you will not have a charge processed.  If you are having a true medical emergency please call 911.  If you need an urgent face to face visit, [facility] has four urgent care centers for your convenience.  If you need care fast and have a high deductible or no insurance consider:   [email or web address] to reserve your spot online an avoid wait times  [facility] [address], [address] [place], [place] [address] 8 am to 8 pm Monday-Friday 10 am to 4 pm Saturday-Sunday *Across the street from [employer]  [facility]  [address] [place], [address] 8 am to 5 pm Monday-Friday * In the [facility] on the [facility]   The following sites will take your  insurance:  . [facility]  [phone] Get Driving Directions Find a Provider at this Location  [address] [place], [place] [address] . 10 am to 8 pm Monday-Friday . 12 pm to 8 pm Saturday-Sunday   . [facility]  [phone] Get Driving Directions Find a Provider at this Location  [address] [place], [place] [address] . 8 am to 8 pm Monday-Friday . 9 am to 6 pm Saturday . 11 am to 6 pm Sunday   . [facility] [place]  [phone] Get Driving Directions  [address] [place], [place] [address] . 8 am to 8 pm Monday-Friday . 8 am to 4 pm Saturday-Sunday   Your e-visit answers were reviewed by a board certified advanced clinical practitioner to complete your personal care plan.  Thank you for using e-Visits.  

## 2018-06-07 NOTE — Patient Instructions (Signed)
Your Plan:  Continue Ajovy and Topamax Decrease Amitriptyline alternating between 10 mg and 20 mg every other night If your symptoms worsen or you develop new symptoms please let us know.    Thank you for [facility]. I hope we have been able to provide you high quality care today.  You may receive a patient satisfaction survey over the next few weeks. We would appreciate your feedback and comments so that we may continue to improve ourselves and the health of our patients.

## 2018-06-07 NOTE — Progress Notes (Signed)
  Since you do not have lower back pain, we will treat. Please see below our plan. Hope you feel better soon!  Based on what you shared with me it looks like you most likely have a simple urinary tract infection.  A UTI (Urinary Tract Infection) is a bacterial infection of the bladder.  Most cases of urinary tract infections are simple to treat but a key part of your care is to encourage you to drink plenty of fluids and watch your symptoms carefully.  I have prescribed Keflex 500 mg twice a day for 7 days.Your symptoms should gradually improve. Call us if the burning in your urine worsens, you develop worsening fever, back pain or pelvic pain or if your symptoms do not resolve after completing the antibiotic.  Urinary tract infections can be prevented by drinking plenty of water to keep your body hydrated.Also be sure when you wipe, wipe from front to back and don't hold it in!If possible, empty your bladder every 4 hours.  Your e-visit answers were reviewed by a board certified advanced clinical practitioner to complete your personal care plan.Depending on the condition, your plan could have included both over the counter or prescription medications.  If there is a problem please reply once you have received a response from your provider.  Your safety is important to us.If you have drug allergies check your prescription carefully.  You can use MyChart to ask questions about today's visit, request a non-urgent call back, or ask for a work or school excuse for 24 hours related to this e-Visit. If it has been greater than 24 hours you will need to follow up with your provider, or enter a new e-Visit to address those concerns.   You will get an e-mail in the next two days asking about your experience.I hope that your e-visit has been valuable and will speed your recovery. Thank you for using e-visits.

## 2018-06-07 NOTE — Addendum Note (Signed)
Addended by: Jannifer RodneyHAWKS, Ashling Roane A on: 06/07/2018 08:06 PM   Modules accepted: Orders

## 2018-06-07 NOTE — Progress Notes (Signed)
PATIENT: Erica Rodriguez DOB: August 14, 1985  REASON FOR VISIT: follow up HISTORY FROM: patient  HISTORY OF PRESENT ILLNESS: Today 06/07/18:  Erica Rodriguez is a 32 year old female with a history of migraine headaches.  She returns today for follow-up.  She is having approximately 3-4 headaches a month.  Her headaches always occur on the right side behind the eye.  She does have photophobia and phonophobia.  She has nausea but no vomiting.  She can take Maxalt and her headache will resolve in 1 to 1-1/2 hours.  She continues on amitriptyline 20 mg at bedtime and Topamax 100 mg twice a day.  She reports that she does not like the way amitriptyline causes weight gain and bloating but it does help with her headaches.  Ideally she would like to come off of this medication but is unsure that she can tolerate the headache.  She also reports that she continues to have issues with insomnia.  She states that she typically wakes up around 3 AM with joint pain.  She states that her joint pain did not start until after she was put on  Ajovy.  However this is not a listed side effect of the medication.  She returns today for evaluation.   UPDATE (11/24/17, VRP): Since last visit, doing better with HA --> averaging 7-12 HA per month. Tolerating emgality (seems to help for 3 weeks after each shot). Now back on amitriptyline 25mg  daily and HA are better. No alleviating or aggravating factors. Has reduced caffeine.   UPDATE (05/22/17, VRP): Since last visit, doing about the same; averaging 15+ days headache per month. Tolerating meds. No alleviating or aggravating factors.   UPDATE 02/19/17 (MM): "Erica Rodriguez is a 32 year old female with a history of migraine headaches. She returns today for follow-up. At the last visit Topamax was increased to 100 mg twice a day. She reports starting in June she had a constant headache for approximately 3 weeks. She reports that she continuously has a dull headache. Occasionally this will advance  to a severe headache. She reports that her headaches typically occur on the right side. She does have photophobia, phonophobia and nausea. She states that her headaches have continued to be daily throughout the month of July. She reports that she may have had only one headache free day. She is tried amitriptyline in the past however this caused constipation. She is also tried Imitrex without benefit. She has tried over-the-counter medications such as Advil and Aleve. She does report that she does drink caffeine throughout the day. She states that she has tried to cut back but reports that she "loves caffeine." In the past she has had a prednisone Dosepak as well as toradol forher headaches. Both have been beneficial. The patient states that she was recently diagnosed with a urinary tract infection. She started Keflex today. Patient reports that she does have high anxiety and stress levels. She reports that her family also reports that she is "high strung." She returns today for an evaluation."  UPDATE 11/11/16: Since last visit, tried amitriptyline, x 2 months, then stopped due to side effects. Tolerating TPX. Headaches are slightly better. Sleep is better. Less caffeine.   UPDATE 08/12/16: Since last visit, HA have worsened. Lately having more cravings for food and soda (2-3 per day). More anxiety and insomnia issues.   UPDATE 02/05/16: Since last visit having 2-3 migraine per month. Some mild hair loss. Also with right TMJ pain in April 2017, then June 2017. Had tx  with PCP (toradol, prednisone, flexeril), and now improved.   UPDATE 08/07/15: Since last visit, HA are improved. Now having 3-4 HA per month. Cambia helping for rescue medication.   UPDATE 04/04/15: Since last visit, has continued with 6-7 mild HA per month; and occ severe right retroorbital severe HA, with nausea, sens to light; some "messed up" vision and spots before some HA. Dr. Cathey Endow ophthalmology eval'd patient, and no sig papilledema  noted. MRI brain and labs unremarkable. Tried imitrex per PCP and had hot feeling, anxiety and flushing, with minimal benefit. Still not sleeping well at night, in spite of nightly ambien.   PRIOR HPI (12/13/14): 32 year old right-handed female here for evaluation of headache, eye pain, swollen optic nerves, retro-orbital pain. One and a half months ago patient noticed right-sided headache, with pressure and pulsating pushing sensation behind her right eye. She had some sensitivity to light, nausea, without phonophobia or vomiting. Initially she thought she was pregnant and took a home pregnancy test which was negative. Symptoms have been constant since that time. She has chronic insomnia and has been on Ambien for several years. She has been on minocycline and trentinon cream for past 6 months. Patient went to optometrist for evaluation was found to have mild optic nerve head edema bilaterally.  REVIEW OF SYSTEMS: Out of a complete 14 system review of symptoms, the patient complains only of the following symptoms, and all other reviewed systems are negative.  Insomnia, headache  ALLERGIES: No Known Allergies  HOME MEDICATIONS: Outpatient Medications Prior to Visit  Medication Sig Dispense Refill  . acetaminophen (TYLENOL) 500 MG tablet Take 500 mg by mouth every 6 (six) hours as needed.    Marland Kitchen amitriptyline (ELAVIL) 10 MG tablet Take 1-2 tablets (10-20 mg total) by mouth at bedtime. 60 tablet 6  . amoxicillin-clavulanate (AUGMENTIN) 875-125 MG tablet Take 1 tablet by mouth 2 (two) times daily. 14 tablet 0  . clindamycin-benzoyl peroxide (BENZACLIN) gel Apply topically 2 (two) times daily. 25 g 0  . cyclobenzaprine (FLEXERIL) 5 MG tablet TAKE 1 TABLET BY MOUTH THREE TIMES A DAY AS NEEDED FOR MUSCLE SPASMS 60 tablet 2  . fluticasone (FLONASE) 50 MCG/ACT nasal spray Place 2 sprays into both nostrils daily. 16 g 6  . Fremanezumab-vfrm (AJOVY) 225 MG/1.5ML SOSY Inject 225 mg into the skin every 30  (thirty) days. 1 Syringe 4  . MIRENA, 52 MG, 20 MCG/24HR IUD     . ondansetron (ZOFRAN ODT) 4 MG disintegrating tablet Take 1 tablet (4 mg total) by mouth every 8 (eight) hours as needed for nausea or vomiting. 30 tablet 3  . rizatriptan (MAXALT-MLT) 10 MG disintegrating tablet Take 1 tablet (10 mg total) by mouth as needed for migraine. May repeat in 2 hours if needed 9 tablet 11  . topiramate (TOPAMAX) 100 MG tablet Take 1 tablet (100 mg total) 2 (two) times daily by mouth. 180 tablet 4  . zolpidem (AMBIEN) 10 MG tablet Take 1 tablet (10 mg total) by mouth at bedtime as needed for sleep. 30 tablet 1   Facility-Administered Medications Prior to Visit  Medication Dose Route Frequency Provider Last Rate Last Dose  . betamethasone acetate-betamethasone sodium phosphate (CELESTONE) injection 6 mg  6 mg Intramuscular Once Mechele Claude, MD        PAST MEDICAL HISTORY: Past Medical History:  Diagnosis Date  . Depression   . Headache    migraines  . Mild preeclampsia     PAST SURGICAL HISTORY: Past Surgical History:  Procedure  Laterality Date  . CESAREAN SECTION    . FOOT SURGERY Bilateral   . WISDOM TOOTH EXTRACTION      FAMILY HISTORY: Family History  Problem Relation Age of Onset  . Thyroid disease Mother   . Hyperlipidemia Mother   . Arthritis/Rheumatoid Mother   . Diabetes Mother   . Hypertension Father   . Kidney disease Father   . Cirrhosis Father   . Diabetes Maternal Grandmother   . Stroke Maternal Grandmother   . Stroke Maternal Grandfather     SOCIAL HISTORY: Social History   Socioeconomic History  . Marital status: Married    Spouse name: Casimiro Needle  . Number of children: 1  . Years of education: 1  . Highest education level: Not on file  Occupational History    Comment: clincal registration  Social Needs  . Financial resource strain: Not on file  . Food insecurity:    Worry: Not on file    Inability: Not on file  . Transportation needs:    Medical:  Not on file    Non-medical: Not on file  Tobacco Use  . Smoking status: Never Smoker  . Smokeless tobacco: Never Used  Substance and Sexual Activity  . Alcohol use: No  . Drug use: No  . Sexual activity: Yes  Lifestyle  . Physical activity:    Days per week: Not on file    Minutes per session: Not on file  . Stress: Not on file  Relationships  . Social connections:    Talks on phone: Not on file    Gets together: Not on file    Attends religious service: Not on file    Active member of club or organization: Not on file    Attends meetings of clubs or organizations: Not on file    Relationship status: Not on file  . Intimate partner violence:    Fear of current or ex partner: Not on file    Emotionally abused: Not on file    Physically abused: Not on file    Forced sexual activity: Not on file  Other Topics Concern  . Not on file  Social History Narrative   Lives at home with husband and son   Drinks 1 soda a day       PHYSICAL EXAM  Vitals:   06/07/18 0733  Height: 5' 1.5" (1.562 m)   Body mass index is 26.21 kg/m.  Generalized: Well developed, in no acute distress   Neurological examination  Mentation: Alert oriented to time, place, history taking. Follows all commands speech and language fluent Cranial nerve II-XII: Pupils were equal round reactive to light. Extraocular movements were full, visual field were full on confrontational test. Facial sensation and strength were normal. Uvula tongue midline. Head turning and shoulder shrug  were normal and symmetric. Motor: The motor testing reveals 5 over 5 strength of all 4 extremities. Good symmetric motor tone is noted throughout.  Sensory: Sensory testing is intact to soft touch on all 4 extremities. No evidence of extinction is noted.  Coordination: Cerebellar testing reveals good finger-nose-finger and heel-to-shin bilaterally.  Gait and station: Gait is normal. Tandem gait is normal. Romberg is negative. No  drift is seen.  Reflexes: Deep tendon reflexes are symmetric and normal bilaterally.   DIAGNOSTIC DATA (LABS, IMAGING, TESTING) - I reviewed patient records, labs, notes, testing and imaging myself where available.  Lab Results  Component Value Date   WBC 5.9 12/11/2016   HGB 13.5 12/11/2016  HCT 40.3 12/11/2016   MCV 97 12/11/2016   PLT 378 12/11/2016      Component Value Date/Time   NA 143 12/11/2016 0917   K 4.5 12/11/2016 0917   CL 108 (H) 12/11/2016 0917   CO2 20 12/11/2016 0917   GLUCOSE 82 12/11/2016 0917   BUN 11 12/11/2016 0917   CREATININE 0.67 12/11/2016 0917   CALCIUM 9.3 12/11/2016 0917   PROT 7.3 12/11/2016 0917   ALBUMIN 4.6 12/11/2016 0917   AST 14 12/11/2016 0917   ALT 10 12/11/2016 0917   ALKPHOS 83 12/11/2016 0917   BILITOT 0.4 12/11/2016 0917   GFRNONAA 118 12/11/2016 0917   GFRAA 135 12/11/2016 0917    Lab Results  Component Value Date   HGBA1C 5.2 08/12/2016   Lab Results  Component Value Date   VITAMINB12 249 12/11/2016   Lab Results  Component Value Date   TSH 1.830 12/11/2016      ASSESSMENT AND PLAN 32 y.o. year old female  has a past medical history of Depression, Headache, and Mild preeclampsia. here with:  1.  Migraine headaches  The patient's headaches have improved on Ajovy however the patient would like to eventually get off of amitriptyline if possible.  I advised that we can adjust her dose.  She can alternate taking 20 mg and 10 mg every other night.  If she tolerates this well we can continue to try to wean down her dose.  In the future we may have to consider Botox if her headache frequency increases.  She returns today for an evaluation.   I spent 15 minutes with the patient. 50% of this time was spent discussing her plan of care   Butch Penny, MSN, NP-C 06/07/2018, 7:35 AM [facility] [address], [address] [place], [place] [address] [phone]

## 2018-06-20 ENCOUNTER — Other Ambulatory Visit: Payer: Self-pay

## 2018-06-20 DIAGNOSIS — L700 Acne vulgaris: Secondary | ICD-10-CM

## 2018-06-21 MED ORDER — CLINDAMYCIN PHOS-BENZOYL PEROX 1-5 % EX GEL: Freq: Two times a day (BID) | CUTANEOUS | 0 refills | Status: DC

## 2018-06-21 MED FILL — CLINDAMYCIN PHOS-BENZOYL PE: 1-5 | 30 days supply | Qty: 25 | Fill #0

## 2018-06-21 NOTE — Telephone Encounter (Signed)
Please review

## 2018-06-29 MED FILL — AJOVY 225 MG/1.5ML SOSY: 225 | 30 days supply | Qty: 2 | Fill #0

## 2018-06-29 MED FILL — RIZATRIPTAN 10 MG ODT: 10 | 30 days supply | Qty: 9 | Fill #5

## 2018-07-01 ENCOUNTER — Telehealth: Payer: Self-pay | Admitting: Nurse Practitioner

## 2018-07-01 MED FILL — AMITRIPTYLINE HCL 10 MG TAB: 10 | 30 days supply | Qty: 60 | Fill #1

## 2018-07-02 MED ORDER — OSELTAMIVIR PHOSPHATE 75 MG PO CAPS: 75.0000 mg | ORAL_CAPSULE | Freq: Two times a day (BID) | ORAL | 0 refills | Status: DC

## 2018-07-02 NOTE — Telephone Encounter (Signed)
Patient aware via voicemail that Tamiflu prescription was sent in for her

## 2018-07-02 NOTE — Telephone Encounter (Signed)
tamiflu rx sent to pharmacy

## 2018-07-05 ENCOUNTER — Telehealth: Payer: Self-pay | Admitting: *Deleted

## 2018-07-05 NOTE — Telephone Encounter (Signed)
Initiated CMM [id number] - KEY for Reauthorization for AJOVY.  Pending determination.

## 2018-07-08 ENCOUNTER — Encounter: Payer: Self-pay | Admitting: *Deleted

## 2018-07-08 NOTE — Telephone Encounter (Signed)
Received approval for Ajovy  225mg /1.21ml.  Reauthorization  Ref # [id number].  07/17/18 thru 07/17/2019.  [employer] [id number].

## 2018-07-22 NOTE — Progress Notes (Signed)
I reviewed note and agree with plan.   Annamay Laymon R. Zayne Draheim, MD 

## 2018-08-02 ENCOUNTER — Encounter: Payer: Self-pay | Admitting: Physician Assistant

## 2018-08-02 ENCOUNTER — Telehealth: Payer: 59 | Admitting: Physician Assistant

## 2018-08-02 DIAGNOSIS — J0191 Acute recurrent sinusitis, unspecified: Secondary | ICD-10-CM | POA: Diagnosis not present

## 2018-08-02 NOTE — Progress Notes (Signed)
  Based on what you shared with me I believe you should be evaluated in a face to face office visit. According to review of your records, you were diagnosed with sinus infection and prescribed antibiotics, less than 3 months ago. Your symptoms today are typical of another sinus infection, however, recurrent sinus infection should be investigated further to avoid mis-diagnosis, as there may be underlying issues such as nasal polyps or other causes.  NOTE: If you entered your credit card information for this eVisit, you will not be charged. You may see a "hold" on your card for the $30 but that hold will drop off and you will not have a charge processed.  If you are having a true medical emergency please call 911.  If you need an urgent face to face visit, [place] has four urgent care centers for your convenience.  If you need care fast and have a high deductible or no insurance consider:   [email or web address] to reserve your spot online an avoid wait times  [facility] [address] [place], [place] [address] 8 am to 8 pm Monday-Friday 10 am to 4 pm Saturday-Sunday *Across the street from [employer]  [address] [place], [address] 8 am to 5 pm Monday-Friday * In the [facility] on the [facility]   The following sites will take your  insurance:  . [facility]  [phone] Get Driving Directions Find a Provider at this Location  [address] [place], [place] [address] . 10 am to 8 pm Monday-Friday . 12 pm to 8 pm Saturday-Sunday   . [facility] at [facility]  [phone] Get Driving Directions Find a Provider at this Location  [address] [place] [address] [place], [place] [address] . 8 am to 8 pm Monday-Friday . 9 am to 6 pm Saturday . 11 am to 6 pm Sunday   . [facility] at [facility]  [phone] Get Driving Directions  [address] [place], [place] [address] . 8 am to 8 pm Monday-Friday . 8 am to 4 pm Saturday-Sunday   Your e-visit answers were reviewed by a board certified advanced clinical practitioner to complete your personal care plan.  Thank you for using e-Visits.

## 2018-08-03 ENCOUNTER — Ambulatory Visit: Payer: 59 | Admitting: Family

## 2018-08-03 ENCOUNTER — Encounter: Payer: Self-pay | Admitting: Family

## 2018-08-03 VITALS — BP 111/75 | HR 114 | Temp 97.0°F | Ht 61.5 in | Wt 140.6 lb

## 2018-08-03 DIAGNOSIS — J0110 Acute frontal sinusitis, unspecified: Secondary | ICD-10-CM

## 2018-08-03 MED ORDER — AMOXICILLIN-POT CLAVULANATE 875-125 MG PO TABS: 1.0000 | ORAL_TABLET | Freq: Two times a day (BID) | ORAL | 0 refills | Status: DC

## 2018-08-03 NOTE — Progress Notes (Signed)
Subjective:    Patient ID: Erica Rodriguez, female    DOB: January 20, 1986, 33 y.o.   MRN: [id number]  Chief Complaint  Patient presents with  . Sinus Problem    facial and ear pain  . Headache    Sinus Problem  This is a new problem. The current episode started 1 to 4 weeks ago. The problem has been gradually worsening since onset. There has been no fever. Her pain is at a severity of 9/10. The pain is moderate. Associated symptoms include congestion, ear pain (right), headaches, a hoarse voice, sinus pressure and sneezing. Pertinent negatives include no coughing, sore throat or swollen glands. Past treatments include oral decongestants. The treatment provided mild relief.  Headache   Associated symptoms include ear pain (right) and sinus pressure. Pertinent negatives include no coughing, sore throat or swollen glands.      Review of Systems  HENT: Positive for congestion, ear pain (right), hoarse voice, sinus pressure and sneezing. Negative for sore throat.   Respiratory: Negative for cough.   Neurological: Positive for headaches.  All other systems reviewed and are negative.      Objective:   Physical Exam Vitals signs reviewed.  Constitutional:      General: She is not in acute distress.    Appearance: She is well-developed. She is ill-appearing.  HENT:     Head: Normocephalic and atraumatic.     Right Ear: External ear normal.     Nose: Mucosal edema present.     Right Sinus: Frontal sinus tenderness present.     Left Sinus: Frontal sinus tenderness present.     Mouth/Throat:     Pharynx: Posterior oropharyngeal erythema present.  Eyes:     Pupils: Pupils are equal, round, and reactive to light.  Neck:     Musculoskeletal: Normal range of motion and neck supple.     Thyroid: No thyromegaly.  Cardiovascular:     Rate and Rhythm: Normal rate and regular rhythm.     Heart sounds: Normal heart sounds. No murmur.  Pulmonary:     Effort: Pulmonary effort is normal. No  respiratory distress.     Breath sounds: Normal breath sounds. No wheezing.  Abdominal:     General: Bowel sounds are normal. There is no distension.     Palpations: Abdomen is soft.     Tenderness: There is no abdominal tenderness.  Musculoskeletal: Normal range of motion.        General: No tenderness.  Skin:    General: Skin is warm and dry.  Neurological:     Mental Status: She is alert and oriented to person, place, and time.     Cranial Nerves: No cranial nerve deficit.     Deep Tendon Reflexes: Reflexes are normal and symmetric.  Psychiatric:        Behavior: Behavior normal.        Thought Content: Thought content normal.        Judgment: Judgment normal.       BP 111/75   Pulse (!) 114   Temp (!) 97 F (36.1 C) (Oral)   Ht 5' 1.5" (1.562 m)   Wt 140 lb 9.6 oz (63.8 kg)   BMI 26.14 kg/m      Assessment & Plan:  Erica Rodriguez comes in today with chief complaint of Sinus Problem (facial and ear pain) and Headache   Diagnosis and orders addressed:  1. Acute frontal sinusitis, recurrence not specified - Take meds as prescribed -  Use a cool mist humidifier  -Use saline nose sprays frequently -Force fluids -For any cough or congestion  Use plain Mucinex- regular strength or max strength is fine -For fever or aces or pains- take tylenol or ibuprofen. -Throat lozenges if help -New toothbrush in 3 days RTO if symptoms worsen or do not improve  - amoxicillin-clavulanate (AUGMENTIN) 875-125 MG tablet; Take 1 tablet by mouth 2 (two) times daily.  Dispense: 14 tablet; Refill: 0   Jannifer Rodney, FNP

## 2018-08-03 NOTE — Patient Instructions (Signed)
Sinusitis, Adult  Sinusitis is inflammation of your sinuses. Sinuses are hollow spaces in the bones around your face. Your sinuses are located:   Around your eyes.   In the middle of your forehead.   Behind your nose.   In your cheekbones.  Mucus normally drains out of your sinuses. When your nasal tissues become inflamed or swollen, mucus can become trapped or blocked. This allows bacteria, viruses, and fungi to grow, which leads to infection. Most infections of the sinuses are caused by a virus.  Sinusitis can develop quickly. It can last for up to 4 weeks (acute) or for more than 12 weeks (chronic). Sinusitis often develops after a cold.  What are the causes?  This condition is caused by anything that creates swelling in the sinuses or stops mucus from draining. This includes:   Allergies.   Asthma.   Infection from bacteria or viruses.   Deformities or blockages in your nose or sinuses.   Abnormal growths in the nose (nasal polyps).   Pollutants, such as chemicals or irritants in the air.   Infection from fungi (rare).  What increases the risk?  You are more likely to develop this condition if you:   Have a weak body defense system (immune system).   Do a lot of swimming or diving.   Overuse nasal sprays.   Smoke.  What are the signs or symptoms?  The main symptoms of this condition are pain and a feeling of pressure around the affected sinuses. Other symptoms include:   Stuffy nose or congestion.   Thick drainage from your nose.   Swelling and warmth over the affected sinuses.   Headache.   Upper toothache.   A cough that may get worse at night.   Extra mucus that collects in the throat or the back of the nose (postnasal drip).   Decreased sense of smell and taste.   Fatigue.   A fever.   Sore throat.   Bad breath.  How is this diagnosed?  This condition is diagnosed based on:   Your symptoms.   Your medical history.   A physical exam.   Tests to find out if your condition is  acute or chronic. This may include:  ? Checking your nose for nasal polyps.  ? Viewing your sinuses using a device that has a light (endoscope).  ? Testing for allergies or bacteria.  ? Imaging tests, such as an MRI or CT scan.  In rare cases, a bone biopsy may be done to rule out more serious types of fungal sinus disease.  How is this treated?  Treatment for sinusitis depends on the cause and whether your condition is chronic or acute.   If caused by a virus, your symptoms should go away on their own within 10 days. You may be given medicines to relieve symptoms. They include:  ? Medicines that shrink swollen nasal passages (topical intranasal decongestants).  ? Medicines that treat allergies (antihistamines).  ? A spray that eases inflammation of the nostrils (topical intranasal corticosteroids).  ? Rinses that help get rid of thick mucus in your nose (nasal saline washes).   If caused by bacteria, your health care provider may recommend waiting to see if your symptoms improve. Most bacterial infections will get better without antibiotic medicine. You may be given antibiotics if you have:  ? A severe infection.  ? A weak immune system.   If caused by narrow nasal passages or nasal polyps, you may need   to have surgery.  Follow these instructions at home:  Medicines   Take, use, or apply over-the-counter and prescription medicines only as told by your health care provider. These may include nasal sprays.   If you were prescribed an antibiotic medicine, take it as told by your health care provider. Do not stop taking the antibiotic even if you start to feel better.  Hydrate and humidify     Drink enough fluid to keep your urine pale yellow. Staying hydrated will help to thin your mucus.   Use a cool mist humidifier to keep the humidity level in your home above 50%.   Inhale steam for 10-15 minutes, 3-4 times a day, or as told by your health care provider. You can do this in the bathroom while a hot shower is  running.   Limit your exposure to cool or dry air.  Rest   Rest as much as possible.   Sleep with your head raised (elevated).   Make sure you get enough sleep each night.  General instructions     Apply a warm, moist washcloth to your face 3-4 times a day or as told by your health care provider. This will help with discomfort.   Wash your hands often with soap and water to reduce your exposure to germs. If soap and water are not available, use hand sanitizer.   Do not smoke. Avoid being around people who are smoking (secondhand smoke).   Keep all follow-up visits as told by your health care provider. This is important.  Contact a health care provider if:   You have a fever.   Your symptoms get worse.   Your symptoms do not improve within 10 days.  Get help right away if:   You have a severe headache.   You have persistent vomiting.   You have severe pain or swelling around your face or eyes.   You have vision problems.   You develop confusion.   Your neck is stiff.   You have trouble breathing.  Summary   Sinusitis is soreness and inflammation of your sinuses. Sinuses are hollow spaces in the bones around your face.   This condition is caused by nasal tissues that become inflamed or swollen. The swelling traps or blocks the flow of mucus. This allows bacteria, viruses, and fungi to grow, which leads to infection.   If you were prescribed an antibiotic medicine, take it as told by your health care provider. Do not stop taking the antibiotic even if you start to feel better.   Keep all follow-up visits as told by your health care provider. This is important.  This information is not intended to replace advice given to you by your health care provider. Make sure you discuss any questions you have with your health care provider.  Document Released: 06/23/2005 Document Revised: 11/23/2017 Document Reviewed: 11/23/2017  [employer]vier Interactive Patient Education  2019 [employer]

## 2018-08-05 MED FILL — AJOVY 225 MG/1.5ML SOSY: 225 | 30 days supply | Qty: 2 | Fill #1

## 2018-08-05 MED FILL — RIZATRIPTAN 10 MG ODT: 10 | 30 days supply | Qty: 9 | Fill #6

## 2018-08-17 ENCOUNTER — Telehealth: Payer: 59 | Admitting: Physician Assistant

## 2018-08-17 DIAGNOSIS — N760 Acute vaginitis: Secondary | ICD-10-CM | POA: Diagnosis not present

## 2018-08-17 MED ORDER — FLUCONAZOLE 150 MG PO TABS: 150.0000 mg | ORAL_TABLET | Freq: Once | ORAL | 0 refills | Status: AC

## 2018-08-17 MED FILL — FLUCONAZOLE 150 MG TABS: 150 | 5 days supply | Qty: 2 | Fill #0

## 2018-08-17 NOTE — Progress Notes (Signed)
We are sorry that you are not feeling well. Here is how we plan to help! Based on what you shared with me it looks like you: May have a yeast vaginosis   Vaginosis is an inflammation of the vagina that can result in discharge, itching and pain. The cause is usually a change in the normal balance of vaginal bacteria or an infection. Vaginosis can also result from reduced estrogen levels after menopause.  The most common causes of vaginosis are:   Bacterial vaginosis which results from an overgrowth of one on several organisms that are normally present in your vagina.   Yeast infections which are caused by a naturally occurring fungus called candida.   Vaginal atrophy (atrophic vaginosis) which results from the thinning of the vagina from reduced estrogen levels after menopause.   Trichomoniasis which is caused by a parasite and is commonly transmitted by sexual intercourse.  Factors that increase your risk of developing vaginosis include: Medications, such as antibiotics and steroids Uncontrolled diabetes Use of hygiene products such as bubble bath, vaginal spray or vaginal deodorant Douching Wearing damp or tight-fitting clothing Using an intrauterine device (IUD) for birth control Hormonal changes, such as those associated with pregnancy, birth control pills or menopause Sexual activity Having a sexually transmitted infection  Your treatment plan is Diflucan (fluconazole) 150mg  tablet.  I have prescribed two pills.  I have electronically sent this prescription into the pharmacy that you have chosen.  Be sure to take all of the medication as directed. Stop taking any medication if you develop a rash, tongue swelling or shortness of breath. Mothers who are breast feeding should consider pumping and discarding their breast milk while on these antibiotics. However, there is no consensus that infant exposure at these doses would be harmful.  Remember that medication creams can weaken latex  condoms. Marland Kitchen   HOME CARE:  Good hygiene may prevent some types of vaginosis from recurring and may relieve some symptoms:  Avoid baths, hot tubs and whirlpool spas. Rinse soap from your outer genital area after a shower, and dry the area well to prevent irritation. Don't use scented or harsh soaps, such as those with deodorant or antibacterial action. Avoid irritants. These include scented tampons and pads. Wipe from front to back after using the toilet. Doing so avoids spreading fecal bacteria to your vagina.  Other things that may help prevent vaginosis include:  Don't douche. Your vagina doesn't require cleansing other than normal bathing. Repetitive douching disrupts the normal organisms that reside in the vagina and can actually increase your risk of vaginal infection. Douching won't clear up a vaginal infection. Use a latex condom. Both female and female latex condoms may help you avoid infections spread by sexual contact. Wear cotton underwear. Also wear pantyhose with a cotton crotch. If you feel comfortable without it, skip wearing underwear to bed. Yeast thrives in [employer] Your symptoms should improve in the next day or two.  GET HELP RIGHT AWAY IF:  You have pain in your lower abdomen ( pelvic area or over your ovaries) You develop nausea or vomiting You develop a fever Your discharge changes or worsens You have persistent pain with intercourse You develop shortness of breath, a rapid pulse, or you faint.  These symptoms could be signs of problems or infections that need to be evaluated by a medical provider now.  MAKE SURE YOU   Understand these instructions. Will watch your condition. Will get help right away if you are not doing  well or get worse.  Your e-visit answers were reviewed by a board certified advanced clinical practitioner to complete your personal care plan. Depending upon the condition, your plan could have included both over the counter or  prescription medications. Please review your pharmacy choice to make sure that you have choses a pharmacy that is open for you to pick up any needed prescription, Your safety is important to us. If you have drug allergies check your prescription carefully.   You can use MyChart to ask questions about today's visit, request a non-urgent call back, or ask for a work or school excuse for 24 hours related to this e-Visit. If it has been greater than 24 hours you will need to follow up with your provider, or enter a new e-Visit to address those concerns. You will get a MyChart message within the next two days asking about your experience. I hope that your e-visit has been valuable and will speed your recovery.  ===View-only below this line===   ----- Message -----    From: Erica Rodriguez    Sent: 08/17/2018  8:20 AM EST      To: E-Visit Mailing List Subject: E-Visit Submission: Vaginal Symptoms  E-Visit Submission: Vaginal Symptoms --------------------------------  Question: Which of the following are you experiencing? Answer:   Vaginal itching            Vaginal discharge  Question: Are you having pain while passing urine? Answer:   No, I have no pain while urinating  Question: Which of the following applies to your vaginal discharge Answer:   I have a white/milky discharge  Question: Are you pregnant? Answer:   I am confident that I am not pregnant  Question: Are you breastfeeding? Answer:   No  Question: Which of the following are you experiencing? Answer:   None of the above  Question: Do you have any sores on your genitals? Answer:   No  Question: Have you taken antibiotics recently? Answer:   Yes, I recently took some  Question: Please enter when you took antibiotics, and the name of the medicine, if you know it. Answer:   I took some amoxicillin 875-125 and finished about a week ago  Question: Do you do any of the following? Answer:   None of the above  Question:  Which of the following applies to your menstrual period? Answer:   I have not had a menstrual period for a while  Question: Have you had similar symptoms in the past? Answer:   Yes, I have had similar symptoms more than once before  Question: When you had similar symptoms in  the past, did any of the following work? Answer:   Pills for yeast infection  Question: Do you have a fever? Answer:   No, I do not have a fever  Question: During the past 2 months, have you had sexual contact with a specific person for the first time? Answer:   No  Question: Has a person with whom you have had sexual contact been recently told they have a disease possibly acquired through sex? Answer:   No  Question: Please list your medication allergies that you may have ? (If 'none' , please list as 'none') Answer:   none  Question: Please list any additional comments  Answer:   I have had yeast infections before it is itching and has some white milky discharge if I could please get at least 2 pills to clear it, I don't think one  pill would be enough,

## 2018-08-17 NOTE — Progress Notes (Signed)
A total of 5-10 minutes was spent evaluating this patients questionnaire and formulating a plan of care.

## 2018-08-31 ENCOUNTER — Telehealth: Payer: 59 | Admitting: Nurse Practitioner

## 2018-08-31 DIAGNOSIS — B373 Candidiasis of vulva and vagina: Secondary | ICD-10-CM

## 2018-08-31 DIAGNOSIS — B3731 Acute candidiasis of vulva and vagina: Secondary | ICD-10-CM

## 2018-08-31 MED ORDER — FLUCONAZOLE 150 MG PO TABS: 150.0000 mg | ORAL_TABLET | Freq: Once | ORAL | 0 refills | Status: AC

## 2018-08-31 MED FILL — TOPIRAMATE 100 MG TABLET: 100 | 90 days supply | Qty: 180 | Fill #1 | Status: TO

## 2018-08-31 MED FILL — AMITRIPTYLINE HCL 10 MG TAB: 10 | 30 days supply | Qty: 60 | Fill #2 | Status: TO

## 2018-08-31 NOTE — Progress Notes (Signed)
We are sorry that you are not feeling well. Here is how we plan to help! Based on what you shared with me it looks like you: May have a yeast vaginosis  Vaginosis is an inflammation of the vagina that can result in discharge, itching and pain. The cause is usually a change in the normal balance of vaginal bacteria or an infection. Vaginosis can also result from reduced estrogen levels after menopause.  The most common causes of vaginosis are:   Bacterial vaginosis which results from an overgrowth of one on several organisms that are normally present in your vagina.   Yeast infections which are caused by a naturally occurring fungus called candida.   Vaginal atrophy (atrophic vaginosis) which results from the thinning of the vagina from reduced estrogen levels after menopause.   Trichomoniasis which is caused by a parasite and is commonly transmitted by sexual intercourse.  Factors that increase your risk of developing vaginosis include: . Medications, such as antibiotics and steroids . Uncontrolled diabetes . Use of hygiene products such as bubble bath, vaginal spray or vaginal deodorant . Douching . Wearing damp or tight-fitting clothing . Using an intrauterine device (IUD) for birth control . Hormonal changes, such as those associated with pregnancy, birth control pills or menopause . Sexual activity . Having a sexually transmitted infection  Your treatment plan is A single Diflucan (fluconazole) 150mg tablet once.  I have electronically sent this prescription into the pharmacy that you have chosen.  Be sure to take all of the medication as directed. Stop taking any medication if you develop a rash, tongue swelling or shortness of breath. Mothers who are breast feeding should consider pumping and discarding their breast milk while on these antibiotics. However, there is no consensus that infant exposure at these doses would be harmful.  Remember that medication creams can weaken latex  condoms. .   HOME CARE:  Good hygiene may prevent some types of vaginosis from recurring and may relieve some symptoms:  . Avoid baths, hot tubs and whirlpool spas. Rinse soap from your outer genital area after a shower, and dry the area well to prevent irritation. Don't use scented or harsh soaps, such as those with deodorant or antibacterial action. . Avoid irritants. These include scented tampons and pads. . Wipe from front to back after using the toilet. Doing so avoids spreading fecal bacteria to your vagina.  Other things that may help prevent vaginosis include:  . Don't douche. Your vagina doesn't require cleansing other than normal bathing. Repetitive douching disrupts the normal organisms that reside in the vagina and can actually increase your risk of vaginal infection. Douching won't clear up a vaginal infection. . Use a latex condom. Both female and female latex condoms may help you avoid infections spread by sexual contact. . Wear cotton underwear. Also wear pantyhose with a cotton crotch. If you feel comfortable without it, skip wearing underwear to bed. Yeast thrives in moist environments Your symptoms should improve in the next day or two.  GET HELP RIGHT AWAY IF:  . You have pain in your lower abdomen ( pelvic area or over your ovaries) . You develop nausea or vomiting . You develop a fever . Your discharge changes or worsens . You have persistent pain with intercourse . You develop shortness of breath, a rapid pulse, or you faint.  These symptoms could be signs of problems or infections that need to be evaluated by a medical provider now.  MAKE SURE YOU      Understand these instructions.  Will watch your condition.  Will get help right away if you are not doing well or get worse.  Your e-visit answers were reviewed by a board certified advanced clinical practitioner to complete your personal care plan. Depending upon the condition, your plan could have included  both over the counter or prescription medications. Please review your pharmacy choice to make sure that you have choses a pharmacy that is open for you to pick up any needed prescription, Your safety is important to us. If you have drug allergies check your prescription carefully.   You can use MyChart to ask questions about today's visit, request a non-urgent call back, or ask for a work or school excuse for 24 hours related to this e-Visit. If it has been greater than 24 hours you will need to follow up with your provider, or enter a new e-Visit to address those concerns. You will get a MyChart message within the next two days asking about your experience. I hope that your e-visit has been valuable and will speed your recovery.  5 minutes spent reviewing and documenting in chart.  

## 2018-09-01 MED FILL — AJOVY 225 MG/1.5ML SOSY: 225 | 30 days supply | Qty: 2 | Fill #2 | Status: TO

## 2018-09-14 ENCOUNTER — Telehealth: Payer: Self-pay | Admitting: *Deleted

## 2018-09-14 DIAGNOSIS — Z0289 Encounter for other administrative examinations: Secondary | ICD-10-CM

## 2018-09-14 NOTE — Telephone Encounter (Signed)
Received [employer] FMLA papers.

## 2018-09-15 NOTE — Telephone Encounter (Signed)
[employer] FMLA papers completed and signed by Dr Marjory Lies, sent to medical records for processing. Notified patient of this through my chart message.

## 2018-09-17 ENCOUNTER — Other Ambulatory Visit: Payer: Self-pay | Admitting: Nurse Practitioner

## 2018-09-17 MED ORDER — ZOLPIDEM TARTRATE 10 MG PO TABS: 10.0000 mg | ORAL_TABLET | Freq: Every evening | ORAL | 1 refills | Status: DC | PRN

## 2018-09-27 MED FILL — AJOVY 225 MG/1.5ML SOSY: 225 | 30 days supply | Qty: 2 | Fill #0

## 2018-10-04 ENCOUNTER — Telehealth: Payer: Self-pay | Admitting: *Deleted

## 2018-10-04 NOTE — Telephone Encounter (Signed)
Received fax from [facility], [place] for PA on Emgality. Per office notes patient is on Ajovy. I called her to confirm; she stated she was on Ajovy. Fax will be disregarded.

## 2018-10-11 MED FILL — AMITRIPTYLINE HCL 10 MG TAB: 10 | 30 days supply | Qty: 60 | Fill #0

## 2018-10-11 MED FILL — RIZATRIPTAN 10 MG ODT: 10 | 30 days supply | Qty: 9 | Fill #0

## 2018-10-12 ENCOUNTER — Telehealth: Payer: 59 | Admitting: Physician Assistant

## 2018-10-12 DIAGNOSIS — L700 Acne vulgaris: Secondary | ICD-10-CM

## 2018-10-12 MED ORDER — MINOCYCLINE HCL 100 MG PO CAPS: 100.0000 mg | ORAL_CAPSULE | Freq: Every day | ORAL | 0 refills | Status: DC

## 2018-10-12 MED ORDER — CLINDAMYCIN PHOS-BENZOYL PEROX 1-5 % EX GEL: Freq: Two times a day (BID) | CUTANEOUS | 0 refills | Status: DC

## 2018-10-12 NOTE — Addendum Note (Signed)
Addended by: Ofilia Neas on: 10/12/2018 11:21 AM   Modules accepted: Orders

## 2018-10-12 NOTE — Progress Notes (Signed)
We are sorry that you are experiencing this issue.  Here is how we plan to help!   Based on what you shared with me it looks like you have uncomplicated acne.  Acne is a disorder of the hair follicles and oil glands (sebaceous glands). The sebaceous glands secrete oils to keep the skin moist.  When the glands get clogged, it can lead to pimples or cysts.  These cysts may become infected and leave scars. Acne is very common and normally occurs at puberty.  Acne is also inherited.  Your personal care plan consists of the following recommendations:  I recommend that you use a daily cleanser    I have prescribed a topical gel with an antibiotic:  Clindamycin 1% lotion mixed with benzoil peroxide.  Apply the lotion to the affected skin twice daily.  Be sure to read the package insert to understand potential side effects,  I have also prescribed one of the following additional therapies:   If excessive dryness or peeling occurs, reduce dose frequency or concentration of the topical scrubs.  If excessive stinging or burning occurs, remove the topical gel with mild soap and water and resume at a lower dose the next day.  Remember oral antibiotics and topical acne treatments may increase your sensitivity to the sun!  HOME CARE: Do not squeeze pimples because that can often lead to infections, worse acne, and scars. Use a moisturizer that contains retinoid or fruit acids that may inhibit the development of new acne lesions. Although there is not a clear link that foods can cause acne, doctors do believe that too many sweets predispose you to skin problems.  GET HELP RIGHT AWAY IF: If your acne gets worse or is not better within 10 days. If you become depressed. If you become pregnant, discontinue medications and call your OB/GYN.  MAKE SURE YOU: Understand these instructions. Will watch your condition. Will get help right away if you are not doing well or get worse.   Your e-visit answers  were reviewed by a board certified advanced clinical practitioner to complete your personal care plan.  Depending upon the condition, your plan could have included both over the counter or prescription medications.  Please review your pharmacy choice.  If there is a problem, you may contact your provider through [employer] messaging and have the prescription routed to another pharmacy.  Your safety is important to us.  If you have drug allergies check your prescription carefully.  For the next 24 hours you can use MyChart to ask questions about today's visit, request a non-urgent call back, or ask for a work or school excuse from your e-visit provider.  You will get an email in the next two days asking about your experience. I hope that your e-visit has been valuable and will speed your recovery.   ===View-only below this line===   ----- Message -----    From: Erica NettleShauna H Rodriguez    Sent: 10/12/2018 10:44 AM EDT      To: E-Visit Mailing List Subject: E-Visit Submission: Acne  E-Visit Submission: Acne --------------------------------  Question: [age]? Answer:   30-50  Question: When did you first begin to notice acne? Answer:   Under age 33  Question: Do you have a family history of acne? Answer:   Yes  Question: Are you female or female? Answer:   Female  Question: Are you pregnant or trying to become pregnant? Answer:   No  Question: Are you breastfeeding? Answer:  No  Question: Are you taking oral contraceptives? Answer:   Yes  Question: What areas of your body are effected by acne? Answer:   Face back and chest  Question: What treatments have you used (check all that apply)? Answer:   Over the counter acne washes            Prescribed topical medications  Question: If you answered yes to any prescribed medications please list them below Answer:   I use clinlque face wash and clindamycin and benzoyl peroxide cream for my face  Question: What is the severity of your  acne? Answer:   Simple pustules (white heads) on my face  Question: Have you experienced any of these symptoms related to your acne [name] all that apply)? Answer:   Severe facial swelling and redness  Question: Are you allergic to erythromycin? Answer:   No  Question: Are you allergic to sulfa drugs? Answer:   No  Question: Are you able to attach up to three photos of your acne for your provider to review? Answer:   No  Question: Please list your medication allergies that you may have ? (If 'none' , please list as 'none') Answer:   none  Question: Please list any additional comments  Answer:   I have a lot of white bumps on my chin the cream seems to help with the black head but not with the white heads and they are all over my chin and they are not small they are big, and then I get big red zits on my chin and chest and shoulders, I can not take the doxycycline it makes my skin burn esp when I take baths, I used to take minocycline along time ago and it helped a lot,  A total of 5-10 minutes was spent evaluating this patients questionnaire and formulating a plan of care.

## 2018-10-13 ENCOUNTER — Other Ambulatory Visit: Payer: Self-pay | Admitting: Nurse Practitioner

## 2018-10-13 MED ORDER — MINOCYCLINE HCL 100 MG PO CAPS: 100.0000 mg | ORAL_CAPSULE | Freq: Every day | ORAL | 1 refills | Status: AC

## 2018-10-13 MED FILL — MINOCYCLINE HCL 100 MG CAPS: 100 | 30 days supply | Qty: 30 | Fill #0

## 2018-10-13 NOTE — Progress Notes (Signed)
Minocycline refill sent to pharmacy. Patient aware.

## 2018-10-27 MED FILL — AJOVY 225 MG/1.5ML SOSY: 225 | 30 days supply | Qty: 2 | Fill #1

## 2018-11-02 ENCOUNTER — Other Ambulatory Visit: Payer: Self-pay | Admitting: Physician Assistant

## 2018-11-02 DIAGNOSIS — L700 Acne vulgaris: Secondary | ICD-10-CM

## 2018-11-06 ENCOUNTER — Telehealth: Payer: 59 | Admitting: Physician Assistant

## 2018-11-06 DIAGNOSIS — N760 Acute vaginitis: Secondary | ICD-10-CM

## 2018-11-06 MED ORDER — FLUCONAZOLE 150 MG PO TABS: 150.0000 mg | ORAL_TABLET | Freq: Once | ORAL | 0 refills | Status: AC

## 2018-11-06 NOTE — Progress Notes (Signed)
I have spent 5 minutes in review of e-visit questionnaire, review and updating patient chart, medical decision making and response to patient.   Claude Waldman Cody Maryama Kuriakose, PA-C    

## 2018-11-06 NOTE — Progress Notes (Signed)

## 2018-11-11 ENCOUNTER — Other Ambulatory Visit: Payer: Self-pay

## 2018-11-11 ENCOUNTER — Encounter: Payer: Self-pay | Admitting: Family Medicine

## 2018-11-11 ENCOUNTER — Ambulatory Visit: Payer: 59 | Admitting: Family Medicine

## 2018-11-11 VITALS — BP 110/60 | HR 104 | Resp 16

## 2018-11-11 DIAGNOSIS — G43701 Chronic migraine without aura, not intractable, with status migrainosus: Secondary | ICD-10-CM | POA: Diagnosis not present

## 2018-11-11 MED ORDER — KETOROLAC TROMETHAMINE 60 MG/2ML IM SOLN
60.0000 mg | Freq: Once | INTRAMUSCULAR | Status: AC
  Administered 2018-11-11: 60 mg via INTRAMUSCULAR

## 2018-11-11 NOTE — Progress Notes (Signed)
   Subjective:    Patient ID: Erica Rodriguez, female    DOB: [birth year], 33 y.o.   MRN: [id number]  HPI Chief Complaint  Patient presents with  . headache    headache   She presents with a 2-day history of migraine headache.  States this is one of her typical headaches but it is not resolving with her usual preventive and rescue medications.  Reports having some blurred vision, nausea. No paresthesias, numbness, tingling or weakness.   Denies fever, chills, dizziness, chest pain, palpitations, shortness of breath, abdominal pain, /V/D, urinary symptoms.   States she took 2 doses of rizatriptan yesterday with no relief. Is on Ajovy, amitriptyline, Topamax for prevention.   States Toradol in the past has helped resolve her headache. Denies having a Toradol injection in several months.  States she has a follow up appt with her neurologist next month.   IUD for contraception.     Review of Systems Pertinent positives and negatives in the history of present illness.     Objective:   Physical Exam BP 110/60   Pulse (!) 115   Alert and oriented and in no distress. No sinus tenderness.  Tympanic membranes and canals are normal. Pharyngeal area is normal and uvula midline. Neck is supple without adenopathy or thyromegaly. Cardiac exam shows a regular sinus rhythm without murmurs or gallops. Lungs are clear to auscultation. Extremities without edema, pulses intact. Skin is warm and dry. PERRLA, EOMs intact. CNs intact. No facial asymmetry or pronator drift. Normal gait. Negative romberg.       Assessment & Plan:  Chronic migraine without aura with status migrainosus, not intractable - Plan: ketorolac (TORADOL) injection 60 mg  In office visit for chronic migraine not relieved with usual medications. She has a neurologist who follows her however she is unable to get in to see them today. Requests a Toradol injection since this has worked in the past.  Neurologically intact. No red flag  symptoms.  This is not the worse headache of her life and she has no new headache symptoms.  Toradol given.  Follow up with neurologist.

## 2018-11-15 ENCOUNTER — Other Ambulatory Visit: Payer: Self-pay

## 2018-11-15 ENCOUNTER — Encounter: Payer: Self-pay | Admitting: Medical

## 2018-11-15 ENCOUNTER — Ambulatory Visit: Payer: 59 | Admitting: Medical

## 2018-11-15 VITALS — BP 120/70 | HR 85 | Temp 98.5°F | Resp 16

## 2018-11-15 DIAGNOSIS — L729 Follicular cyst of the skin and subcutaneous tissue, unspecified: Secondary | ICD-10-CM

## 2018-11-15 DIAGNOSIS — L089 Local infection of the skin and subcutaneous tissue, unspecified: Secondary | ICD-10-CM

## 2018-11-15 MED ORDER — AMOXICILLIN 875 MG PO TABS: 875.0000 mg | ORAL_TABLET | Freq: Two times a day (BID) | ORAL | 0 refills | Status: DC

## 2018-11-15 NOTE — Progress Notes (Signed)
  Subjective:   Erica Rodriguez is a 33 y.o. female who presents for evaluation of a probable cutaneous abscess. Lesion is located in the right axilla. Onset was 2 days ago. Symptoms have gradually worsened.  Abscess has associated symptoms of pain, redness, swelling  Patient does not have previous history of cutaneous abscesses.   Patient denies hx/o MRSA.  Patient denies hx/o I&D for similar.  Patient does not have diabetes.  Patient denies hx/o poor wound healing, compromised immunity or HIV.  No prior hidradenitis suppurative.  No other aggravating or relieving factors.  No other c/o.  Past Medical History:  Diagnosis Date  . Depression   . Headache    migraines  . Mild preeclampsia     Reviewed prior allergies, medications, past medical history, past surgical history.  ROS as in subjective   Objective:   BP 120/70   Pulse 85   Temp 98.5 F (36.9 C) (Oral)   Resp 16   SpO2 100%   Gen: wd, wn, nad Skin: in the right axilla there is a swollen somewhat erythematous lump approximately 57mm diameter, slight induration but no fluctuance.   No warmth.  No drainage.  No other abnormality noted.      Assessment:     Encounter Diagnosis  Name Primary?  . Infected cyst of skin Yes     Plan:   Discussed examination findings, diagnosis, usual course of illness, and options for therapy discussed. After discussing recommendations, patient agrees to I&D, oral antibiotics.    Procedure Informed consent obtained.  The area was prepped in the usual manner and the skin overlying the right red tender lump was anesthetized with 2cc of 1% lidocaine with epinephrine.  The area was incised and approx -1-2 ccs of purulent material was obtained.  Area was irrigated with high pressure saline. Packing was not inserted. Wound was covered with sterile bandage.    Advised patient to complete the course of oral antibiotics, use warm compresses or heat applied to the area to promote drainage.  Follow  up: prn,  However, if worse signs of infections as discussed (fever, chills, nausea, vomiting, worsening redness, worsening pain), then call or return immediately.  Erica Rodriguez was seen today for cyst.  Diagnoses and all orders for this visit:  Infected cyst of skin  Other orders -     amoxicillin (AMOXIL) 875 MG tablet; Take 1 tablet (875 mg total) by mouth 2 (two) times daily.

## 2018-11-15 NOTE — Patient Instructions (Signed)
Recommendations:  Use warm compress or use hot water soapy bath to the armpit several times in the next few days  Use over the counter Ibuprofen for pain and inflammation, such as 3 over the counter ibuprofen twice daily today and the next 3 days  Avoid deodorant in the right armpit the next several days until wound is healed  Consider discarding the current deodorant in case this is contaminated  Consider using watch and wait approach the next 24 hours.  If any worse redness, swelling, pain or oozing, then begin antibiotic sent to pharmacy  Call if worsening findings       Hidradenitis Suppurativa Hidradenitis suppurativa is a long-term (chronic) skin disease. It is similar to a severe form of acne, but it affects areas of the body where acne would be unusual, especially areas of the body where skin rubs against skin and becomes moist. These include:  Underarms.  Groin.  Genital area.  Buttocks.  Upper thighs.  Breasts. Hidradenitis suppurativa may start out as small lumps or pimples caused by blocked sweat glands or hair follicles. Pimples may develop into deep sores that break open (rupture) and drain pus. Over time, affected areas of skin may thicken and become scarred. This condition is rare and does not spread from person to person (non-contagious). What are the causes? The exact cause of this condition is not known. It may be related to:  Female and female hormones.  An overactive disease-fighting system (immune system). The immune system may over-react to blocked hair follicles or sweat glands and cause swelling and pus-filled sores. What increases the risk? You are more likely to develop this condition if you:  Are female.  Are 34-34 years old.  Have a family history of hidradenitis suppurativa.  Have a personal history of acne.  Are overweight.  Smoke.  Take the medicine lithium. What are the signs or symptoms? The first symptoms are usually painful  bumps in the skin, similar to pimples. The condition may get worse over time (progress), or it may only cause mild symptoms. If the disease progresses, symptoms may include:  Skin bumps getting bigger and growing deeper into the skin.  Bumps rupturing and draining pus.  Itchy, infected skin.  Skin getting thicker and scarred.  Tunnels under the skin (fistulas) where pus drains from a bump.  Pain during daily activities, such as pain during walking if your groin area is affected.  Emotional problems, such as stress or depression. This condition may affect your appearance and your ability or willingness to wear certain clothes or do certain activities. How is this diagnosed? This condition is diagnosed by a health care provider who specializes in skin diseases (dermatologist). You may be diagnosed based on:  Your symptoms and medical history.  A physical exam.  Testing a pus sample for infection.  Blood tests. How is this treated? Your treatment will depend on how severe your symptoms are. The same treatment will not work for everybody with this condition. You may need to try several treatments to find what works best for you. Treatment may include:  Cleaning and bandaging (dressing) your wounds as needed.  Lifestyle changes, such as new skin care routines.  Taking medicines, such as: ? Antibiotics. ? Acne medicines. ? Medicines to reduce the activity of the immune system. ? A diabetes medicine (metformin). ? Birth control pills, for women. ? Steroids to reduce swelling and pain.  Working with a mental health care provider, if you experience emotional distress due  to this condition. If you have severe symptoms that do not get better with medicine, you may need surgery. Surgery may involve:  Using a laser to clear the skin and remove hair follicles.  Opening and draining deep sores.  Removing the areas of skin that are diseased and scarred. Follow these instructions at  home: Medicines   Take over-the-counter and prescription medicines only as told by your health care provider.  If you were prescribed an antibiotic medicine, take it as told by your health care provider. Do not stop taking the antibiotic even if your condition improves. Skin care  If you have open wounds, cover them with a clean dressing as told by your health care provider. Keep wounds clean by washing them gently with soap and water when you bathe.  Do not shave the areas where you get hidradenitis suppurativa.  Do not wear deodorant.  Wear loose-fitting clothes.  Try to avoid getting overheated or sweaty. If you get sweaty or wet, change into clean, dry clothes as soon as you can.  To help relieve pain and itchiness, cover sore areas with a warm, clean washcloth (warm compress) for 5-10 minutes as often as needed.  If told by your health care provider, take a bleach bath twice a week: ? Fill your bathtub halfway with water. ? Pour in  cup of unscented household bleach. ? Soak in the tub for 5-10 minutes. ? Only soak from the neck down. Avoid water on your face and hair. ? Shower to rinse off the bleach from your skin. General instructions  Learn as much as you can about your disease so that you have an active role in your treatment. Work closely with your health care provider to find treatments that work for you.  If you are overweight, work with your health care provider to lose weight as recommended.  Do not use any products that contain nicotine or tobacco, such as cigarettes and e-cigarettes. If you need help quitting, ask your health care provider.  If you struggle with living with this condition, talk with your health care provider or work with a mental health care provider as recommended.  Keep all follow-up visits as told by your health care provider. This is important. Where to find more information  Hidradenitis Suppurativa Foundation, Inc.:  https://www.hs-foundation.org/ Contact a health care provider if you have:  A flare-up of hidradenitis suppurativa.  A fever or chills.  Trouble controlling your symptoms at home.  Trouble doing your daily activities because of your symptoms.  Trouble dealing with emotional problems related to your condition. Summary  Hidradenitis suppurativa is a long-term (chronic) skin disease. It is similar to a severe form of acne, but it affects areas of the body where acne would be unusual.  The first symptoms are usually painful bumps in the skin, similar to pimples. The condition may get worse over time (progress), or it may only cause mild symptoms.  If you have open wounds, cover them with a clean dressing as told by your health care provider. Keep wounds clean by washing them gently with soap and water when you bathe.  Besides skin care, treatment may include medicines, laser treatment, and surgery. This information is not intended to replace advice given to you by your health care provider. Make sure you discuss any questions you have with your health care provider. Document Released: 02/05/2004 Document Revised: 07/01/2017 Document Reviewed: 07/01/2017 Elsevier Interactive Patient Education  2019 [employer].

## 2018-11-17 MED FILL — RIZATRIPTAN 10 MG ODT: 10 | 30 days supply | Qty: 9 | Fill #1

## 2018-11-17 MED FILL — AMITRIPTYLINE HCL 10 MG TAB: 10 | 30 days supply | Qty: 60 | Fill #1

## 2018-11-22 MED FILL — AJOVY 225 MG/1.5ML SOSY: 225 | 30 days supply | Qty: 2 | Fill #2

## 2018-11-26 MED FILL — TOPIRAMATE 100 MG TABLET: 100 | 90 days supply | Qty: 180 | Fill #0

## 2018-12-03 ENCOUNTER — Other Ambulatory Visit: Payer: Self-pay

## 2018-12-03 ENCOUNTER — Telehealth: Payer: Self-pay | Admitting: Internal Medicine

## 2018-12-03 ENCOUNTER — Other Ambulatory Visit (INDEPENDENT_AMBULATORY_CARE_PROVIDER_SITE_OTHER): Payer: 59

## 2018-12-03 DIAGNOSIS — G43701 Chronic migraine without aura, not intractable, with status migrainosus: Secondary | ICD-10-CM

## 2018-12-03 MED ORDER — KETOROLAC TROMETHAMINE 60 MG/2ML IM SOLN
60.0000 mg | Freq: Once | INTRAMUSCULAR | Status: AC
  Administered 2018-12-03: 08:00:00 60 mg via INTRAMUSCULAR

## 2018-12-03 NOTE — Telephone Encounter (Signed)
I spoke to her in the office today for ongoing migraine this been present since Monday.  She saw Hetty Blend nurse practitioner here recently for the same.  Symptoms are not letting up despite other acute therapy.  We decided to do injection of 60 Toradol and oral 4 mg Zofran today in the office for acute migraine

## 2018-12-03 NOTE — Telephone Encounter (Signed)
Pt is following up with headache from when she seen vickie. She has had a headache all week. torodal 60mg  was given and zofran was given

## 2018-12-06 ENCOUNTER — Telehealth: Payer: Self-pay | Admitting: Diagnostic Neuroimaging

## 2018-12-06 DIAGNOSIS — G43001 Migraine without aura, not intractable, with status migrainosus: Secondary | ICD-10-CM | POA: Diagnosis not present

## 2018-12-06 NOTE — Telephone Encounter (Signed)
Per [name] RN patient rated headache at 8/10 before infusion. She received total of Depacon 1000 mg IV, and then reported headache 4/10.

## 2018-12-06 NOTE — Telephone Encounter (Signed)
LVM requesting call back to advise if she can come in today for infusion. Need to ask patient about insurance, new allergies and be sure she is not pregnant.

## 2018-12-06 NOTE — Telephone Encounter (Signed)
Received call back from patient. She can come in today for Depacon infusion. She denied being pregnant or having new medication allergies. No insurance changes. Per Dr Richrd Humbles VO order, gave order sheet to [facility] RN for depacon 500 mg IV, may repeat same dose if no relief form first dose, also provided [name] with insurance information, demographics. Patient aware to wear mask, will get temp checked before allowed to enter office.

## 2018-12-06 NOTE — Telephone Encounter (Signed)
Pt called in requesting sooner appt due to headaches getting worse, pt gives consent for Doxy.me visit and also provides consent to bill insurance  [phone]  [employer]  Crisoforo [place] was sent out to patient, pt did receive

## 2018-12-06 NOTE — Telephone Encounter (Signed)
Spoke with patient who stated she's had headache x 2 weeks, received toradol injections x 2 in last two weeks, most recent dose on 12/03/18. The injections gave her fair relief.  Rizatriptan has not been helpful. Her headaches is on right side of head and "feels like someone punched me". [name] RN stated she has availability today if patient can come before 3 pm, and I advised patient of same. I advised will let Dr Danae Orleans know and call her back. Patient verbalized understanding, appreciation.

## 2018-12-06 NOTE — Telephone Encounter (Signed)
Yes ok for infusion. -VRP

## 2018-12-07 NOTE — Telephone Encounter (Signed)
Spoke with patient who stated her headache is not completely gone, but better. Updated her EMR.

## 2018-12-07 NOTE — Addendum Note (Signed)
Addended by: Colen Darling C on: 12/07/2018 10:14 AM   Modules accepted: Orders

## 2018-12-08 ENCOUNTER — Ambulatory Visit (INDEPENDENT_AMBULATORY_CARE_PROVIDER_SITE_OTHER): Payer: 59 | Admitting: Diagnostic Neuroimaging

## 2018-12-08 ENCOUNTER — Other Ambulatory Visit: Payer: Self-pay

## 2018-12-08 ENCOUNTER — Encounter: Payer: Self-pay | Admitting: Diagnostic Neuroimaging

## 2018-12-08 DIAGNOSIS — G43111 Migraine with aura, intractable, with status migrainosus: Secondary | ICD-10-CM | POA: Diagnosis not present

## 2018-12-08 NOTE — Progress Notes (Signed)
    Virtual Visit via Video Note  I connected with Erica Rodriguez on 12/08/18 at  3:00 PM EDT by a video enabled telemedicine application and verified that I am speaking with the correct person using two identifiers.   I discussed the limitations of evaluation and management by telemedicine and the availability of in person appointments. The patient expressed understanding and agreed to proceed.  Patient is at their home. I am at the office.    History of Present Illness:  UPDATE (12/08/18, VRP): Since last visit, doing about the same. Continues with 10 HA / migraine per month. On ajovy; still feels some joint aches after injections. Tolerating TPX, amitrip, rizatrip. No alleviating or aggravating factors. Interested in botox.       Observations/Objective:  - awake, alert; face symm; no dysarhria - no distress   12/13/14 MRI brain - This is a normal MRI of the brain with and without contrast. There are no acute findings.    Assessment and Plan:  Dx:  1. Intractable migraine with aura with status migrainosus      33 y.o. female here with new onset headache, blurred vision, possible optic nerve edema (but later found to be normal), since April 2016. MRI, labs, and eye exam are unremarkable. Likely represents migraine with aura. Also with chronic insomnia and mild anxiety symptoms.  Meds tried and failed: amitriptyline, imitrex, toradol, flexeril, topiramate, gabapentin, sertraline, emgality, aimovig, ajovy   Dx:   1. Intractable migraine with aura with status migrainosus       PLAN:  MIGRAINE PREVENTION - continue topiramate 100mg  twice a day; for migraine prevention - continue ajovy monthly - wean off amitripyline (10mg  daily x 2 weeks, then stop); not effective and causing weight gain - will consider botox in future   MIGRAINE RESCUE - ibuprofen, tylenol - cambia as needed  - rizatriptan as needed  INSOMNIA / ANXIETY (stable) - monitor for now; may  consider psychiatry / psychology follow up in future    Follow Up Instructions:  - Return pending symptoms.    I discussed the assessment and treatment plan with the patient. The patient was provided an opportunity to ask questions and all were answered. The patient agreed with the plan and demonstrated an understanding of the instructions.   The patient was advised to call back or seek an in-person evaluation if the symptoms worsen or if the condition fails to improve as anticipated.  I provided 15 minutes of non-face-to-face time during this encounter.   Suanne Marker, MD 12/08/2018, 3:31 PM Certified in Neurology, Neurophysiology and Neuroimaging  [facility] [address] [place], [place] [address] [phone]

## 2018-12-09 ENCOUNTER — Other Ambulatory Visit: Payer: Self-pay

## 2018-12-09 DIAGNOSIS — R5383 Other fatigue: Secondary | ICD-10-CM

## 2018-12-09 DIAGNOSIS — Z13220 Encounter for screening for lipoid disorders: Secondary | ICD-10-CM

## 2018-12-09 DIAGNOSIS — Z0000 Encounter for general adult medical examination without abnormal findings: Secondary | ICD-10-CM

## 2018-12-10 ENCOUNTER — Encounter: Payer: 59 | Admitting: Nurse Practitioner

## 2018-12-14 ENCOUNTER — Ambulatory Visit: Payer: 59 | Admitting: Diagnostic Neuroimaging

## 2018-12-20 ENCOUNTER — Ambulatory Visit: Payer: Self-pay | Admitting: Neurology

## 2018-12-21 ENCOUNTER — Ambulatory Visit (INDEPENDENT_AMBULATORY_CARE_PROVIDER_SITE_OTHER): Payer: 59 | Admitting: Neurology

## 2018-12-21 ENCOUNTER — Other Ambulatory Visit: Payer: Self-pay

## 2018-12-21 ENCOUNTER — Telehealth: Payer: Self-pay | Admitting: *Deleted

## 2018-12-21 ENCOUNTER — Telehealth: Payer: Self-pay | Admitting: Neurology

## 2018-12-21 ENCOUNTER — Other Ambulatory Visit: Payer: Self-pay | Admitting: Neurology

## 2018-12-21 ENCOUNTER — Encounter: Payer: Self-pay | Admitting: Neurology

## 2018-12-21 DIAGNOSIS — G43711 Chronic migraine without aura, intractable, with status migrainosus: Secondary | ICD-10-CM | POA: Diagnosis not present

## 2018-12-21 MED ORDER — FROVATRIPTAN SUCCINATE 2.5 MG PO TABS: ORAL_TABLET | ORAL | 3 refills | Status: DC

## 2018-12-21 MED ORDER — NARATRIPTAN HCL 2.5 MG PO TABS: ORAL_TABLET | ORAL | 6 refills | Status: DC

## 2018-12-21 MED ORDER — METHYLPREDNISOLONE 4 MG PO TBPK: ORAL_TABLET | ORAL | 1 refills | Status: DC

## 2018-12-21 MED FILL — NARATRIPTAN HCL 2.5 MG TAB: 2.5 | 30 days supply | Qty: 9 | Fill #0

## 2018-12-21 NOTE — Telephone Encounter (Signed)
Botox 200 unit order form completed for G43.711. Pending MD signature.

## 2018-12-21 NOTE — Telephone Encounter (Signed)
I called and scheduled the patient for her Botox injection. She would like to follow up on the request for a nerve block and see where that stands. Please call and advise.

## 2018-12-21 NOTE — Telephone Encounter (Signed)
Called pt and LVM advising that Dr. Jaynee Eagles is running a few minutes behind and unable to logon to doxy website at this time. I advised Dr. Jaynee Eagles will call her if unable to logon to doxy website. Left office number for call back.

## 2018-12-21 NOTE — Progress Notes (Signed)
[facility]    Provider:  Dr Jaynee Eagles Requesting Provider: Chevis Pretty, * Primary Care Provider:  Chevis Pretty, FNP  CC:  Chronic migraines  Virtual Visit via Video Note  I connected with@ on 12/21/18 at  9:00 AM EDT by a video enabled telemedicine application and verified that I am speaking with the correct person using two identifiers. Patient is at home and physician is in the office.   I discussed the limitations of evaluation and management by telemedicine and the availability of in person appointments. The patient expressed understanding and agreed to proceed.  Melvenia Beam, MD  HPI:  Erica Rodriguez is a 33 y.o. female here as requested by Hassell Done, [name], * for migraines. She is a new patient to me and was seeing Dr. Leta Baptist. She has a PMHx of chronic migraines, depression. She had headaches daily, for 6 months, worsening since memorial day, Since January and February having 15 days of headaches and 10 migraine days a month, right eye, unilateral, pounding, pulsating, throbbing, photo/phonophobia, nausea, lots of stress at work and home, making it worse. Since memorial day she has daily migraines. No medication overuse. No aura. She has tried and failed multiple medications. Stopping Ajovy. No other focal neurologic deficits, associated symptoms, inciting events or modifiable factors.  Meds tried and failed: amitriptyline, imitrex, toradol, flexeril, topiramate, gabapentin, sertraline, emgality, aimovig, ajovy  Reviewed notes, labs and imaging from outside physicians, which showed:  MRI brain 12/2014 showed No acute intracranial abnormalities including mass lesion or mass effect, hydrocephalus, extra-axial fluid collection, midline shift, hemorrhage, or acute infarction, large ischemic events (personally reviewed images)     Review of Systems: Patient complains of symptoms per HPI as well as the following symptoms: migraines .  Pertinent negatives and positives per HPI. All others negative.   Social History   Socioeconomic History  . Marital status: Married    Spouse name: Erica Rodriguez  . Number of children: 1  . Years of education: 38  . Highest education level: Not on file  Occupational History    Comment: clincal registration  Social Needs  . Financial resource strain: Not on file  . Food insecurity    Worry: Not on file    Inability: Not on file  . Transportation needs    Medical: Not on file    Non-medical: Not on file  Tobacco Use  . Smoking status: Never Smoker  . Smokeless tobacco: Never Used  Substance and Sexual Activity  . Alcohol use: No  . Drug use: No  . Sexual activity: Yes  Lifestyle  . Physical activity    Days per week: Not on file    Minutes per session: Not on file  . Stress: Not on file  Relationships  . Social Herbalist on phone: Not on file    Gets together: Not on file    Attends religious service: Not on file    Active member of club or organization: Not on file    Attends meetings of clubs or organizations: Not on file    Relationship status: Not on file  . Intimate partner violence    Fear of current or ex partner: Not on file    Emotionally abused: Not on file    Physically abused: Not on file    Forced sexual activity: Not on file  Other Topics Concern  . Not on file  Social History Narrative   Lives at home with husband and son  Drinks 1 soda a day     Family History  Problem Relation Age of Onset  . Thyroid disease Mother   . Hyperlipidemia Mother   . Arthritis/Rheumatoid Mother   . Diabetes Mother   . Hypertension Father   . Kidney disease Father   . Cirrhosis Father   . Diabetes Maternal Grandmother   . Stroke Maternal Grandmother   . Stroke Maternal Grandfather     Past Medical History:  Diagnosis Date  . Depression   . Headache    migraines  . Mild preeclampsia     Patient Active Problem List   Diagnosis Date Noted  .  Chronic migraine without aura, with intractable migraine, so stated, with status migrainosus 12/21/2018  . Migraine 05/02/2017  . Insomnia 10/07/2012  . GAD (generalized anxiety disorder) 10/07/2012    Past Surgical History:  Procedure Laterality Date  . CESAREAN SECTION    . FOOT SURGERY Bilateral   . WISDOM TOOTH EXTRACTION      Current Outpatient Medications  Medication Sig Dispense Refill  . acetaminophen (TYLENOL) 500 MG tablet Take 500 mg by mouth every 6 (six) hours as needed.    . clindamycin-benzoyl peroxide (BENZACLIN) gel Apply topically 2 (two) times daily. 25 g 0  . cyclobenzaprine (FLEXERIL) 5 MG tablet TAKE 1 TABLET BY MOUTH THREE TIMES A DAY AS NEEDED FOR MUSCLE SPASMS (Patient not taking: Reported on 12/07/2018) 60 tablet 2  . fluticasone (FLONASE) 50 MCG/ACT nasal spray Place 2 sprays into both nostrils daily. 16 g 6  . frovatriptan (FROVA) 2.5 MG tablet Take twice daily for 3 days and then as needed up to twice daily. 9 tablet 3  . methylPREDNISolone (MEDROL DOSEPAK) 4 MG TBPK tablet Take pills all together in the morning with food x 6 days. 21 tablet 1  . MIRENA, 52 MG, 20 MCG/24HR IUD     . ondansetron (ZOFRAN ODT) 4 MG disintegrating tablet Take 1 tablet (4 mg total) by mouth every 8 (eight) hours as needed for nausea or vomiting. 30 tablet 3  . rizatriptan (MAXALT-MLT) 10 MG disintegrating tablet Take 1 tablet (10 mg total) by mouth as needed for migraine. May repeat in 2 hours if needed 9 tablet 11  . topiramate (TOPAMAX) 100 MG tablet Take 1 tablet (100 mg total) by mouth 2 (two) times daily. 180 tablet 4  . zolpidem (AMBIEN) 10 MG tablet Take 1 tablet (10 mg total) by mouth at bedtime as needed for up to 30 days for sleep. 30 tablet 1   No current facility-administered medications for this visit.     Allergies as of 12/21/2018  . (No Known Allergies)    Vitals: There were no vitals taken for this visit. Last Weight:  Wt Readings from Last 1 Encounters:   08/03/18 140 lb 9.6 oz (63.8 kg)   Last Height:   Ht Readings from Last 1 Encounters:  08/03/18 5' 1.5" (1.562 m)     Physical exam: Exam: Physical exam: Exam: Gen: NAD, conversant      CV: attempted, Could not perform over Web Video. Denies palpitations or chest pain or SOB. VS: Breathing at a normal rate. Weight appears within normal limits. Not febrile. Eyes: Conjunctivae clear without exudates or hemorrhage  Neuro: Detailed Neurologic Exam  Speech:    Speech is normal; fluent and spontaneous with normal comprehension.  Cognition:    The patient is oriented to person, place, and time;     recent and remote memory intact;  language fluent;     normal attention, concentration,     fund of knowledge Cranial Nerves:    The pupils are equal, round, and reactive to light. Attempted, Cannot perform fundoscopic exam. Visual fields are full to finger confrontation. Extraocular movements are intact.  The face is symmetric with normal sensation. The palate elevates in the midline. Hearing intact. Voice is normal. Shoulder shrug is normal. The tongue has normal motion without fasciculations.   Coordination:    Normal finger to nose  Gait:    Normal native gait  Motor Observation:   no involuntary movements noted. Tone:    Appears normal  Posture:    Posture is normal. normal erect    Strength:    Strength is anti-gravity and symmetric in the upper and lower limbs.      Sensation: intact to LT     Reflex Exam:  DTR's:    Attempted, Could not perform over Web Video   Toes: Attempted Could not perform over Web Video  Clonus:   Attempted, Could not perform over Web Video     Assessment/Plan:  33 year old with intractable chronic migraines, no aura, no medication overuse.  START BOTOX Stop Ajovy, stop amitriptyline (she gained weight) Look into nerve blocks if frova and steroids don;t break current cycle. Frova x 3 days and a medrol dosepak for current status  migrainosus    Follow Up Instructions:    I discussed the assessment and treatment plan with the patient. The patient was provided an opportunity to ask questions and all were answered. The patient agreed with the plan and demonstrated an understanding of the instructions.   The patient was advised to call back or seek an in-person evaluation if the symptoms worsen or if the condition fails to improve as anticipated.   Meds ordered this encounter  Medications  . methylPREDNISolone (MEDROL DOSEPAK) 4 MG TBPK tablet    Sig: Take pills all together in the morning with food x 6 days.    Dispense:  21 tablet    Refill:  1  . frovatriptan (FROVA) 2.5 MG tablet    Sig: Take twice daily for 3 days and then as needed up to twice daily.    Dispense:  9 tablet    Refill:  3    Cc: Daphine DeutscherMartin, [name], *,    Naomie DeanAntonia Olden Klauer, MD  [facility] [address] [place], [place] [id number]  Phone [phone] Fax [phone]

## 2018-12-21 NOTE — Telephone Encounter (Signed)
Thanks, I will email her

## 2018-12-21 NOTE — Telephone Encounter (Signed)
Erica Rodriguez, Patient called about nerve blocks. I wanted to perform nerve blocks on this patient  [id number] x 2 and [id number] x 2  Any way to know what her responsibility will be from insurance? She has [facility] she said.  Thanks

## 2018-12-23 ENCOUNTER — Other Ambulatory Visit: Payer: Self-pay | Admitting: *Deleted

## 2018-12-23 ENCOUNTER — Telehealth: Payer: Self-pay

## 2018-12-23 MED ORDER — ZOLPIDEM TARTRATE 10 MG PO TABS: 10.0000 mg | ORAL_TABLET | Freq: Every evening | ORAL | 0 refills | Status: DC | PRN

## 2018-12-23 MED FILL — ZOLPIDEM TARTRATE 10 MG TAB: 10 | 30 days supply | Qty: 30 | Fill #0

## 2018-12-23 NOTE — Telephone Encounter (Signed)
I sent that 1 month refill, she will need to follow-up for an appointment from there.

## 2018-12-23 NOTE — Telephone Encounter (Signed)
Spoke with Eda Keys at [facility] outpatient pharmacy. Amerge was already picked up by the patient.

## 2018-12-23 NOTE — Telephone Encounter (Signed)
Patient of MMM. She has an appt next week for a complete physical and med refills. We had to move her appt because it was changed to a remote day and now she will be out of her Ambien before next week. Can she get a refill before the appt so she doesn't run out? Please review and advise

## 2018-12-23 NOTE — Telephone Encounter (Signed)
Patient aware.

## 2018-12-27 ENCOUNTER — Other Ambulatory Visit: Payer: Self-pay

## 2018-12-27 ENCOUNTER — Other Ambulatory Visit: Payer: 59

## 2018-12-27 DIAGNOSIS — R5383 Other fatigue: Secondary | ICD-10-CM

## 2018-12-27 DIAGNOSIS — Z13220 Encounter for screening for lipoid disorders: Secondary | ICD-10-CM

## 2018-12-27 DIAGNOSIS — Z0000 Encounter for general adult medical examination without abnormal findings: Secondary | ICD-10-CM | POA: Diagnosis not present

## 2018-12-29 LAB — CBC WITH DIFFERENTIAL/PLATELET
Basophils Absolute: 0 10*3/uL (ref 0.0–0.2)
Basos: 0 %
EOS (ABSOLUTE): 0.2 10*3/uL (ref 0.0–0.4)
Eos: 2 %
Hematocrit: 40.2 % (ref 34.0–46.6)
Hemoglobin: 13.7 g/dL (ref 11.1–15.9)
Immature Grans (Abs): 0 10*3/uL (ref 0.0–0.1)
Immature Granulocytes: 0 %
Lymphocytes Absolute: 3.2 10*3/uL — ABNORMAL HIGH (ref 0.7–3.1)
Lymphs: 33 %
MCH: 32.8 pg (ref 26.6–33.0)
MCHC: 34.1 g/dL (ref 31.5–35.7)
MCV: 96 fL (ref 79–97)
Monocytes Absolute: 0.8 10*3/uL (ref 0.1–0.9)
Monocytes: 8 %
Neutrophils Absolute: 5.5 10*3/uL (ref 1.4–7.0)
Neutrophils: 57 %
Platelets: 449 10*3/uL (ref 150–450)
RBC: 4.18 x10E6/uL (ref 3.77–5.28)
RDW: 12.2 % (ref 11.7–15.4)
WBC: 9.6 10*3/uL (ref 3.4–10.8)

## 2018-12-29 LAB — CMP14+EGFR
ALT: 9 IU/L (ref 0–32)
AST: 12 IU/L (ref 0–40)
Albumin/Globulin Ratio: 1.8 (ref 1.2–2.2)
Albumin: 4.7 g/dL (ref 3.8–4.8)
Alkaline Phosphatase: 75 IU/L (ref 39–117)
BUN/Creatinine Ratio: 15 (ref 9–23)
BUN: 11 mg/dL (ref 6–20)
Bilirubin Total: 0.4 mg/dL (ref 0.0–1.2)
CO2: 21 mmol/L (ref 20–29)
Calcium: 9.5 mg/dL (ref 8.7–10.2)
Chloride: 106 mmol/L (ref 96–106)
Creatinine, Ser: 0.72 mg/dL (ref 0.57–1.00)
GFR calc Af Amer: 127 mL/min/{1.73_m2} (ref >59)
GFR calc non Af Amer: 110 mL/min/{1.73_m2} (ref >59)
Globulin, Total: 2.6 g/dL (ref 1.5–4.5)
Glucose: 95 mg/dL (ref 65–99)
Potassium: 3.5 mmol/L (ref 3.5–5.2)
Sodium: 141 mmol/L (ref 134–144)
Total Protein: 7.3 g/dL (ref 6.0–8.5)

## 2018-12-29 LAB — LIPID PANEL
Chol/HDL Ratio: 3.1 ratio (ref 0.0–4.4)
Cholesterol, Total: 163 mg/dL (ref 100–199)
HDL: 53 mg/dL (ref >39)
LDL Calculated: 98 mg/dL (ref 0–99)
Triglycerides: 58 mg/dL (ref 0–149)
VLDL Cholesterol Cal: 12 mg/dL (ref 5–40)

## 2018-12-29 LAB — THYROID PANEL WITH TSH
Free Thyroxine Index: 2.2 (ref 1.2–4.9)
T3 Uptake Ratio: 26 % (ref 24–39)
T4, Total: 8.3 ug/dL (ref 4.5–12.0)
TSH: 2.06 u[IU]/mL (ref 0.450–4.500)

## 2018-12-29 LAB — VITAMIN D 25 HYDROXY (VIT D DEFICIENCY, FRACTURES): Vit D, 25-Hydroxy: 51.6 ng/mL (ref 30.0–100.0)

## 2018-12-29 LAB — VITAMIN B12: Vitamin B-12: 196 pg/mL — ABNORMAL LOW (ref 232–1245)

## 2018-12-29 LAB — ESTROGENS, TOTAL: Estrogen: 171 pg/mL

## 2018-12-29 LAB — TESTOSTERONE: Testosterone: 9 ng/dL (ref 8–48)

## 2018-12-30 ENCOUNTER — Telehealth: Payer: Self-pay | Admitting: Neurology

## 2018-12-30 ENCOUNTER — Other Ambulatory Visit: Payer: Self-pay

## 2018-12-30 NOTE — Telephone Encounter (Signed)
I called the patient's insurance to check coverage and pre certification requirements. [place] JLL#[id number]. DW

## 2018-12-31 ENCOUNTER — Encounter: Payer: Self-pay | Admitting: Nurse Practitioner

## 2018-12-31 ENCOUNTER — Ambulatory Visit (INDEPENDENT_AMBULATORY_CARE_PROVIDER_SITE_OTHER): Payer: 59 | Admitting: Nurse Practitioner

## 2018-12-31 VITALS — BP 119/80 | HR 113 | Temp 97.9°F | Ht 61.0 in | Wt 143.0 lb

## 2018-12-31 DIAGNOSIS — L700 Acne vulgaris: Secondary | ICD-10-CM

## 2018-12-31 DIAGNOSIS — E538 Deficiency of other specified B group vitamins: Secondary | ICD-10-CM | POA: Diagnosis not present

## 2018-12-31 DIAGNOSIS — Z0001 Encounter for general adult medical examination with abnormal findings: Secondary | ICD-10-CM | POA: Diagnosis not present

## 2018-12-31 DIAGNOSIS — F5101 Primary insomnia: Secondary | ICD-10-CM

## 2018-12-31 DIAGNOSIS — F411 Generalized anxiety disorder: Secondary | ICD-10-CM

## 2018-12-31 DIAGNOSIS — G43711 Chronic migraine without aura, intractable, with status migrainosus: Secondary | ICD-10-CM | POA: Diagnosis not present

## 2018-12-31 MED ORDER — ZOLPIDEM TARTRATE 10 MG PO TABS: 10.0000 mg | ORAL_TABLET | Freq: Every evening | ORAL | 0 refills | Status: DC | PRN

## 2018-12-31 MED ORDER — CYANOCOBALAMIN 1000 MCG/ML IJ SOLN
1000.0000 ug | Freq: Once | INTRAMUSCULAR | Status: AC
  Administered 2018-12-31: 1000 ug via INTRAMUSCULAR

## 2018-12-31 MED ORDER — CLINDAMYCIN PHOS-BENZOYL PEROX 1-5 % EX GEL: Freq: Two times a day (BID) | CUTANEOUS | 0 refills | Status: DC

## 2018-12-31 MED FILL — CLINDAMYCIN PHOS-BENZOYL PE: 1-5 | 30 days supply | Qty: 25 | Fill #0

## 2018-12-31 NOTE — Progress Notes (Signed)
Subjective:    Patient ID: Erica Rodriguez, female    DOB: 03-22-1986, 33 y.o.   MRN: [id number]   Chief Complaint: Annual Exam    HPI:  1. Primary insomnia Is on ambien nightly- can't sleep without it.  2. GAD (generalized anxiety disorder) She is doing okay. Says she doe snot need any meds  3. Chronic migraine without aura, with intractable migraine, so stated, with status migrainosus Is on topamax and uses emerge when she has migraine. Last migraine was in May. She is going to try botox injections strating next week.    Outpatient Encounter Medications as of 12/31/2018  Medication Sig  . acetaminophen (TYLENOL) 500 MG tablet Take 500 mg by mouth every 6 (six) hours as needed.  . clindamycin-benzoyl peroxide (BENZACLIN) gel Apply topically 2 (two) times daily.  . cyclobenzaprine (FLEXERIL) 5 MG tablet TAKE 1 TABLET BY MOUTH THREE TIMES A DAY AS NEEDED FOR MUSCLE SPASMS  . fluticasone (FLONASE) 50 MCG/ACT nasal spray Place 2 sprays into both nostrils daily.  . methylPREDNISolone (MEDROL DOSEPAK) 4 MG TBPK tablet Take pills all together in the morning with food x 6 days.  Marland Kitchen. MIRENA, 52 MG, 20 MCG/24HR IUD   . naratriptan (AMERGE) 2.5 MG tablet Take twice daily for 3 days then as needed up to two times a day for migraine.  . ondansetron (ZOFRAN ODT) 4 MG disintegrating tablet Take 1 tablet (4 mg total) by mouth every 8 (eight) hours as needed for nausea or vomiting.  . topiramate (TOPAMAX) 100 MG tablet Take 1 tablet (100 mg total) by mouth 2 (two) times daily.  Marland Kitchen. zolpidem (AMBIEN) 10 MG tablet Take 1 tablet (10 mg total) by mouth at bedtime as needed for up to 30 days for sleep.     Past Surgical History:  Procedure Laterality Date  . CESAREAN SECTION    . FOOT SURGERY Bilateral   . WISDOM TOOTH EXTRACTION      Family History  Problem Relation Age of Onset  . Thyroid disease Mother   . Hyperlipidemia Mother   . Arthritis/Rheumatoid Mother   . Diabetes Mother   .  Hypertension Father   . Kidney disease Father   . Cirrhosis Father   . Diabetes Maternal Grandmother   . Stroke Maternal Grandmother   . Stroke Maternal Grandfather     New complaints: C/O fatigue  Social history:       Review of Systems  Constitutional: Negative for activity change and appetite change.  HENT: Negative.   Eyes: Negative for pain.  Respiratory: Negative for shortness of breath.   Cardiovascular: Negative for chest pain, palpitations and leg swelling.  Gastrointestinal: Negative for abdominal pain.  Endocrine: Negative for polydipsia.  Genitourinary: Negative.   Skin: Negative for rash.  Neurological: Negative for dizziness, weakness and headaches.  Hematological: Does not bruise/bleed easily.  Psychiatric/Behavioral: Negative.   All other systems reviewed and are negative.      Objective:   Physical Exam Vitals signs and nursing note reviewed.  Constitutional:      General: She is not in acute distress.    Appearance: Normal appearance. She is well-developed.  HENT:     Head: Normocephalic.     Nose: Nose normal.  Eyes:     Pupils: Pupils are equal, round, and reactive to light.  Neck:     Musculoskeletal: Normal range of motion and neck supple.     Vascular: No carotid bruit or JVD.  Cardiovascular:  Rate and Rhythm: Normal rate and regular rhythm.     Heart sounds: Normal heart sounds.  Pulmonary:     Effort: Pulmonary effort is normal. No respiratory distress.     Breath sounds: Normal breath sounds. No wheezing or rales.  Chest:     Chest wall: No tenderness.  Abdominal:     General: Bowel sounds are normal. There is no distension or abdominal bruit.     Palpations: Abdomen is soft. There is no hepatomegaly, splenomegaly, mass or pulsatile mass.     Tenderness: There is no abdominal tenderness.  Musculoskeletal: Normal range of motion.  Lymphadenopathy:     Cervical: No cervical adenopathy.  Skin:    General: Skin is warm and  dry.  Neurological:     Mental Status: She is alert and oriented to person, place, and time.     Deep Tendon Reflexes: Reflexes are normal and symmetric.  Psychiatric:        Behavior: Behavior normal.        Thought Content: Thought content normal.        Judgment: Judgment normal.    BP 119/80   Pulse (!) 113   Temp 97.9 F (36.6 C) (Oral)   Ht 5\' 1"  (1.549 m)   Wt 143 lb (64.9 kg)   BMI 27.02 kg/m       Assessment & Plan:  Erica Rodriguez comes in today with chief complaint of Annual Exam   Diagnosis and orders addressed:  1. Primary insomnia Bedtime routine - zolpidem (AMBIEN) 10 MG tablet; Take 1 tablet (10 mg total) by mouth at bedtime as needed for up to 30 days for sleep.  Dispense: 30 tablet; Refill: 0  2. GAD (generalized anxiety disorder) Stress management  3. Chronic migraine without aura, with intractable migraine, so stated, with status migrainosus Keep appointment for botox injections kep diary of migraines  4. Low serum vitamin B12 b12 injection- 1 per week for 4 weeks, then 1 EOW for 4 weeks then monthly  5. Acne vulgaris Witch hazel OTC - clindamycin-benzoyl peroxide (BENZACLIN) gel; Apply topically 2 (two) times daily.  Dispense: 25 g; Refill: 0   Labs pending Health Maintenance reviewed Diet and exercise encouraged  Follow up plan: prn   [name] Hassell Done, FNP

## 2018-12-31 NOTE — Patient Instructions (Signed)
Vitamin B12 Deficiency Vitamin B12 deficiency means that your body is not getting enough vitamin B12. Your body needs vitamin B12 for important bodily functions. If you do not have enough vitamin B12 in your body, you can have health problems. Follow these instructions at home:  Take supplements only as told by your doctor. Follow the directions carefully.  Get any shots (injections) as told by your doctor. Do not miss your visits to the doctor.  Eat lots of healthy foods that contain vitamin B12. Ask your doctor if you should work with someone who is trained in how food affects health (dietitian). Foods that contain vitamin B12 include: ? Meat. ? Meat from birds (poultry). ? Fish. ? Eggs. ? Cereal and dairy products that are fortified. This means that vitamin B12 has been added to the food. Check the label on the package to see if the food is fortified.  Do not drink too much (do not abuse) alcohol.  Keep all follow-up visits as told by your doctor. This is important. Contact a doctor if:  Your symptoms come back. Get help right away if:  You have trouble breathing.  You have chest pain.  You get dizzy.  You pass out (lose consciousness). This information is not intended to replace advice given to you by your health care provider. Make sure you discuss any questions you have with your health care provider. Document Released: 06/12/2011 Document Revised: 11/29/2015 Document Reviewed: 11/08/2014 Elsevier Interactive Patient Education  2019 [employer]  

## 2019-01-04 DIAGNOSIS — G43001 Migraine without aura, not intractable, with status migrainosus: Secondary | ICD-10-CM | POA: Diagnosis not present

## 2019-01-04 NOTE — Telephone Encounter (Signed)
Spoke with Dr. Jaynee Eagles. She offered a nerve block. Pt unable d/t insurance. Dr. Jaynee Eagles offered infusion. Leann RN in infusion aware. I spoke with the patient and she will come today. She understands the plan will be give Depacon, Toradol, steroids. She will drive herself. 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 She denied any known exposure. Patient aware to wear mask, will get temp checked upon arrival.  Order written per Dr. Jaynee Eagles for Depacon 1 gram IV x 1, Toradol 30 mg IV x 1, Solumedrol 250 mg IV x 1. Dr. Felecia Shelling signed on Dr. Cathren Laine behalf. Order with ins/demographic information taken to [facility] in infusion.

## 2019-01-05 ENCOUNTER — Other Ambulatory Visit: Payer: Self-pay

## 2019-01-05 ENCOUNTER — Ambulatory Visit: Payer: 59 | Admitting: Neurology

## 2019-01-05 ENCOUNTER — Telehealth: Payer: Self-pay | Admitting: Neurology

## 2019-01-05 VITALS — Temp 98.4°F

## 2019-01-05 DIAGNOSIS — M7918 Myalgia, other site: Secondary | ICD-10-CM

## 2019-01-05 DIAGNOSIS — G43711 Chronic migraine without aura, intractable, with status migrainosus: Secondary | ICD-10-CM | POA: Diagnosis not present

## 2019-01-05 MED ORDER — KETOROLAC TROMETHAMINE 60 MG/2ML IM SOLN
60.0000 mg | Freq: Once | INTRAMUSCULAR | Status: AC
  Administered 2019-01-05: 17:00:00 60 mg via INTRAMUSCULAR

## 2019-01-05 MED ORDER — NURTEC 75 MG PO TBDP: 75.0000 mg | ORAL_TABLET | Freq: Every day | ORAL | 0 refills | Status: DC | PRN

## 2019-01-05 NOTE — Progress Notes (Signed)
Botox consent signed by patient Botox- 200 units x 1 vial Lot: [id number] Expiration: 09/2021 NDC: 7017-7939-03  Bacteriostatic 0.9% Sodium Chloride- 60mL total Lot: [id number] Expiration: 04/07/2019 NDC: 0076-2263-33  Dx: L45.625 B/B  Patient given Toradol 60 mg IM injection x 1 per order. Administered 30 mg into each deltoid. No bleeding. Pt tolerated well. Bandaid applied. See MAR.

## 2019-01-05 NOTE — Telephone Encounter (Signed)
3 mo Botox inj  °

## 2019-01-05 NOTE — Progress Notes (Signed)
Consent Form Botulism Toxin Injection For Chronic Migraine  This is our first botox. Baseline is daily headaches, 10-15 migraine days a month. +OO, +Masseters. Dry needling for myofascial cervical pain syndrome.   Orders Placed This Encounter  Procedures  . Ambulatory referral to Physical Therapy   Meds ordered this encounter  Medications  . Rimegepant Sulfate (NURTEC) 75 MG TBDP    Sig: Take 75 mg by mouth daily as needed.    Dispense:  30 tablet    Refill:  0  . ketorolac (TORADOL) injection 60 mg   Let her borrow my myofascial tool she agreed to return it at next visit.  Orders Placed This Encounter  Procedures  . Ambulatory referral to Physical Therapy   Will try Nurtec for acute management  Reviewed orally with patient, additionally signature is on file:  Botulism toxin has been approved by the Federal drug administration for treatment of chronic migraine. Botulism toxin does not cure chronic migraine and it may not be effective in some patients.  The administration of botulism toxin is accomplished by injecting a small amount of toxin into the muscles of the neck and head. Dosage must be titrated for each individual. Any benefits resulting from botulism toxin tend to wear off after 3 months with a repeat injection required if benefit is to be maintained. Injections are usually done every 3-4 months with maximum effect peak achieved by about 2 or 3 weeks. Botulism toxin is expensive and you should be sure of what costs you will incur resulting from the injection.  The side effects of botulism toxin use for chronic migraine may include:   -Transient, and usually mild, facial weakness with facial injections  -Transient, and usually mild, head or neck weakness with head/neck injections  -Reduction or loss of forehead facial animation due to forehead muscle weakness  -Eyelid drooping  -Dry eye  -Pain at the site of injection or bruising at the site of injection  -Double  vision  -Potential unknown long term risks  Contraindications: You should not have Botox if you are pregnant, nursing, allergic to albumin, have an infection, skin condition, or muscle weakness at the site of the injection, or have myasthenia gravis, Lambert-Eaton syndrome, or ALS.  It is also possible that as with any injection, there may be an allergic reaction or no effect from the medication. Reduced effectiveness after repeated injections is sometimes seen and rarely infection at the injection site may occur. All care will be taken to prevent these side effects. If therapy is given over a long time, atrophy and wasting in the muscle injected may occur. Occasionally the patient's become refractory to treatment because they develop antibodies to the toxin. In this event, therapy needs to be modified.  I have read the above information and consent to the administration of botulism toxin.    BOTOX PROCEDURE NOTE FOR MIGRAINE HEADACHE    Contraindications and precautions discussed with patient(above). Aseptic procedure was observed and patient tolerated procedure. Procedure performed by Dr. Georgia Dom  The condition has existed for more than 6 months, and pt does not have a diagnosis of ALS, Myasthenia Gravis or Lambert-Eaton Syndrome.  Risks and benefits of injections discussed and pt agrees to proceed with the procedure.  Written consent obtained  These injections are medically necessary. Pt  receives good benefits from these injections. These injections do not cause sedations or hallucinations which the oral therapies may cause.  Description of procedure:  The patient was placed in a  sitting position. The standard protocol was used for Botox as follows, with 5 units of Botox injected at each site:   -Procerus muscle, midline injection  -Corrugator muscle, bilateral injection  -Frontalis muscle, bilateral injection, with 2 sites each side, medial injection was performed in the upper  one third of the frontalis muscle, in the region vertical from the medial inferior edge of the superior orbital rim. The lateral injection was again in the upper one third of the forehead vertically above the lateral limbus of the cornea, 1.5 cm lateral to the medial injection site.  - Levator Scapulae: 5 units bilaterally  -Temporalis muscle injection, 5 sites, bilaterally. The first injection was 3 cm above the tragus of the ear, second injection site was 1.5 cm to 3 cm up from the first injection site in line with the tragus of the ear. The third injection site was 1.5-3 cm forward between the first 2 injection sites. The fourth injection site was 1.5 cm posterior to the second injection site. 5th site laterally in the temporalis  muscleat the level of the outer canthus.  - Patient feels her clenching is a trigger for headaches. +5 units masseter bilaterally   - Patient feels the migraines are centered around the eyes +5 units bilaterally at the outer canthus in the orbicularis occuli  -Occipitalis muscle injection, 3 sites, bilaterally. The first injection was done one half way between the occipital protuberance and the tip of the mastoid process behind the ear. The second injection site was done lateral and superior to the first, 1 fingerbreadth from the first injection. The third injection site was 1 fingerbreadth superiorly and medially from the first injection site.  -Cervical paraspinal muscle injection, 2 sites, bilateral knee first injection site was 1 cm from the midline of the cervical spine, 3 cm inferior to the lower border of the occipital protuberance. The second injection site was 1.5 cm superiorly and laterally to the first injection site.  -Trapezius muscle injection was performed at 3 sites, bilaterally. The first injection site was in the upper trapezius muscle halfway between the inflection point of the neck, and the acromion. The second injection site was one half way between  the acromion and the first injection site. The third injection was done between the first injection site and the inflection point of the neck.   Will return for repeat injection in 3 months.   A 200 unit sof Botox was used, any Botox not injected was wasted. The patient tolerated the procedure well, there were no complications of the above procedure.

## 2019-01-06 NOTE — Telephone Encounter (Signed)
I called and scheduled the patient for her injection. DW  °

## 2019-01-07 ENCOUNTER — Other Ambulatory Visit: Payer: Self-pay

## 2019-01-07 ENCOUNTER — Ambulatory Visit (INDEPENDENT_AMBULATORY_CARE_PROVIDER_SITE_OTHER): Payer: 59 | Admitting: *Deleted

## 2019-01-07 DIAGNOSIS — E538 Deficiency of other specified B group vitamins: Secondary | ICD-10-CM | POA: Diagnosis not present

## 2019-01-07 MED ORDER — CYANOCOBALAMIN 1000 MCG/ML IJ SOLN
1000.0000 ug | Freq: Once | INTRAMUSCULAR | Status: AC
  Administered 2019-01-07: 1000 ug via INTRAMUSCULAR

## 2019-01-10 ENCOUNTER — Other Ambulatory Visit: Payer: Self-pay | Admitting: Neurology

## 2019-01-10 ENCOUNTER — Ambulatory Visit: Payer: 59 | Admitting: Physical Therapy

## 2019-01-10 MED ORDER — NURTEC 75 MG PO TBDP: 75.0000 mg | ORAL_TABLET | Freq: Once | ORAL | 11 refills | Status: DC | PRN

## 2019-01-10 MED ORDER — UBRELVY 50 MG PO TABS: 50.0000 mg | ORAL_TABLET | ORAL | 6 refills | Status: DC | PRN

## 2019-01-11 ENCOUNTER — Telehealth: Payer: Self-pay

## 2019-01-11 MED FILL — UBRELVY 50 MG TABS: 50 | 30 days supply | Qty: 10 | Fill #0

## 2019-01-11 NOTE — Telephone Encounter (Signed)
We received a prior authorization request for Ubrelvy 50mg . I have completed and submitted the PA on Cover My Meds and should have a determination within 48-72 hours.  Cover My Meds Key:[id number]

## 2019-01-12 ENCOUNTER — Encounter: Payer: Self-pay | Admitting: *Deleted

## 2019-01-12 NOTE — Telephone Encounter (Signed)
Received fax from [place]. Ubrelvy 50 mg has been approved from 01/11/2019 through 07/13/2019. I message the pt in mychart with an update.

## 2019-01-13 ENCOUNTER — Encounter: Payer: Self-pay | Admitting: *Deleted

## 2019-01-13 ENCOUNTER — Telehealth: Payer: Self-pay | Admitting: *Deleted

## 2019-01-13 MED FILL — NURTEC 75 MG TBDP: 75 | 30 days supply | Qty: 8 | Fill #0

## 2019-01-13 NOTE — Telephone Encounter (Signed)
Nurtec PA completed on CMM. KEY: [id number]. Awaiting [employer] determination. Pt will still be able to receive with savings card even if this is denied.

## 2019-01-13 NOTE — Telephone Encounter (Signed)
Per [employer], Nurtec approved from 01/11/2019 through 07/13/2019.

## 2019-01-14 ENCOUNTER — Ambulatory Visit (INDEPENDENT_AMBULATORY_CARE_PROVIDER_SITE_OTHER): Payer: 59 | Admitting: *Deleted

## 2019-01-14 ENCOUNTER — Other Ambulatory Visit: Payer: Self-pay

## 2019-01-14 DIAGNOSIS — E538 Deficiency of other specified B group vitamins: Secondary | ICD-10-CM

## 2019-01-14 MED ORDER — CYANOCOBALAMIN 1000 MCG/ML IJ SOLN
1000.0000 ug | Freq: Once | INTRAMUSCULAR | Status: AC
  Administered 2019-01-14: 1000 ug via INTRAMUSCULAR

## 2019-01-14 NOTE — Progress Notes (Signed)
Pt given Cyanocobalamin inj Tolerated well 

## 2019-01-18 ENCOUNTER — Other Ambulatory Visit: Payer: Self-pay | Admitting: Physician Assistant

## 2019-01-18 DIAGNOSIS — L700 Acne vulgaris: Secondary | ICD-10-CM

## 2019-01-20 NOTE — Telephone Encounter (Signed)
Called pt and scheduled her for nerve block next Wed 7/22 @ 8:30 AM arrival 8:00. She verbalized appreciation.

## 2019-01-21 ENCOUNTER — Other Ambulatory Visit: Payer: Self-pay

## 2019-01-21 ENCOUNTER — Ambulatory Visit (INDEPENDENT_AMBULATORY_CARE_PROVIDER_SITE_OTHER): Payer: 59 | Admitting: *Deleted

## 2019-01-21 DIAGNOSIS — E538 Deficiency of other specified B group vitamins: Secondary | ICD-10-CM | POA: Diagnosis not present

## 2019-01-21 MED ORDER — CYANOCOBALAMIN 1000 MCG/ML IJ SOLN
1000.0000 ug | INTRAMUSCULAR | Status: DC
  Administered 2019-01-21: 14:00:00 1000 ug via INTRAMUSCULAR

## 2019-01-21 NOTE — Progress Notes (Signed)
Pt given Cyanocobalamin inj Tolerated well 

## 2019-01-26 ENCOUNTER — Encounter: Payer: Self-pay | Admitting: *Deleted

## 2019-01-26 ENCOUNTER — Ambulatory Visit: Payer: 59 | Admitting: Neurology

## 2019-01-26 ENCOUNTER — Encounter: Payer: Self-pay | Admitting: Neurology

## 2019-01-26 ENCOUNTER — Other Ambulatory Visit: Payer: Self-pay

## 2019-01-26 DIAGNOSIS — M62838 Other muscle spasm: Secondary | ICD-10-CM | POA: Diagnosis not present

## 2019-01-26 DIAGNOSIS — R110 Nausea: Secondary | ICD-10-CM

## 2019-01-26 DIAGNOSIS — G43909 Migraine, unspecified, not intractable, without status migrainosus: Secondary | ICD-10-CM

## 2019-01-26 DIAGNOSIS — G43709 Chronic migraine without aura, not intractable, without status migrainosus: Secondary | ICD-10-CM

## 2019-01-26 MED ORDER — TOPIRAMATE 100 MG PO TABS: 100.0000 mg | ORAL_TABLET | Freq: Two times a day (BID) | ORAL | 4 refills | Status: DC

## 2019-01-26 MED ORDER — ONDANSETRON 4 MG PO TBDP: 4.0000 mg | ORAL_TABLET | Freq: Three times a day (TID) | ORAL | 3 refills | Status: DC | PRN

## 2019-01-26 MED ORDER — CYCLOBENZAPRINE HCL 5 MG PO TABS: ORAL_TABLET | ORAL | 11 refills | Status: DC

## 2019-01-26 MED ORDER — NARATRIPTAN HCL 2.5 MG PO TABS: ORAL_TABLET | ORAL | 6 refills | Status: DC

## 2019-01-26 MED FILL — ONDANSETRON ODT 4 MG TABLET: 4 | 10 days supply | Qty: 30 | Fill #0

## 2019-01-26 MED FILL — CYCLOBENZAPRINE 5 MG TABLET: 5 | 20 days supply | Qty: 60 | Fill #0

## 2019-01-26 MED FILL — NARATRIPTAN HCL 2.5 MG TAB: 2.5 | 30 days supply | Qty: 9 | Fill #0

## 2019-01-26 NOTE — Progress Notes (Signed)
Nerve block w/o steroid: Pt signed consent  0.5% Bupivocaine 7.5 mL LOT: [id number] EXP: 07/2019 NDC: 01007-121-97  2% Lidocaine 7.5 mL LOT: [id number] EXP: 02/05/2019 NDC: 5883-2549-82

## 2019-01-26 NOTE — Progress Notes (Signed)
[facility]    Provider:  Dr Lucia GaskinsAhern Requesting Provider: Bennie PieriniMartin, [name], * Primary Care Provider:  Bennie PieriniMartin, [name], FNP  CC:  Chronic migraines  Interval history 01/26/2019: She has had one appointment and a botox treatment a few weeks ago. This is our first botox. Baseline is daily headaches, 10-15 migraine days a month. +OO, +Masseters. Dry needling for myofascial cervical pain syndrome. Had some neck pain after the botox treatments. She troed [place]rel[place] and t helped with the migraine. She is on: Nurtec, Ubrelvy, Topamax 100mg  twice daily, naratriptan, flexeril. Also stress at work. Performed nerve blocks today as well (charge just for appointment time). Headache from 6/10 to 0/10.   Virtual Visit via Video Note  I connected with@ on 01/26/19 at  8:30 AM EDT by a video enabled telemedicine application and verified that I am speaking with the correct person using two identifiers. Patient is at home and physician is in the office.   I discussed the limitations of evaluation and management by telemedicine and the availability of in person appointments. The patient expressed understanding and agreed to proceed.  Anson FretAhern, Jaquelynn Wanamaker B, MD  HPI:  Philis NettleShauna H Uresti is a 33 y.o. female here as requested by Daphine DeutscherMartin, [name], * for migraines. She is a new patient to me and was seeing Dr. Marjory LiesPenumalli. She has a PMHx of chronic migraines, depression. She had headaches daily, for 6 months, worsening since memorial day, Since January and February having 15 days of headaches and 10 migraine days a month, right eye, unilateral, pounding, pulsating, throbbing, photo/phonophobia, nausea, lots of stress at work and home, making it worse. Since memorial day she has daily migraines. No medication overuse. No aura. She has tried and failed multiple medications. Stopping Ajovy. No other focal neurologic deficits, associated symptoms, inciting events or modifiable factors.  Meds tried and  failed: amitriptyline, imitrex, toradol, flexeril, topiramate, gabapentin, sertraline, emgality, aimovig, ajovy  Reviewed notes, labs and imaging from outside physicians, which showed:  MRI brain 12/2014 showed No acute intracranial abnormalities including mass lesion or mass effect, hydrocephalus, extra-axial fluid collection, midline shift, hemorrhage, or acute infarction, large ischemic events (personally reviewed images)     Review of Systems: Patient complains of symptoms per HPI as well as the following symptoms: migraines . Pertinent negatives and positives per HPI. All others negative.   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  Social Needs   Financial resource strain: Not on file   Food insecurity    Worry: Not on file    Inability: Not on file   Transportation needs    Medical: Not on file    Non-medical: Not on file  Tobacco Use   Smoking status: Never Smoker   Smokeless tobacco: Never Used  Substance and Sexual Activity   Alcohol use: No   Drug use: No   Sexual activity: Yes  Lifestyle   Physical activity    Days per week: Not on file    Minutes per session: Not on file   Stress: Not on file  Relationships   Social connections    Talks on phone: Not on file    Gets together: Not on file    Attends religious service: Not on file    Active member of club or  organization: Not on file    Attends meetings of clubs or organizations: Not on file    Relationship status: Not on file   Intimate partner violence    Fear of current or ex partner: Not on file    Emotionally abused: Not on file    Physically abused: Not on file    Forced sexual activity: Not on file  Other Topics Concern   Not on file  Social History Narrative   Lives at home with husband  and son   Drinks 1 soda a day     Family History  Problem Relation Age of Onset   Thyroid disease Mother    Hyperlipidemia Mother    Arthritis/Rheumatoid Mother    Diabetes Mother    Hypertension Father    Kidney disease Father    Cirrhosis Father    Diabetes Maternal Grandmother    Stroke Maternal Grandmother    Stroke Maternal Grandfather     Past Medical History:  Diagnosis Date   Depression    Headache    migraines   Mild preeclampsia     Patient Active Problem List   Diagnosis Date Noted   Chronic migraine without aura, with intractable migraine, so stated, with status migrainosus 12/21/2018   Insomnia 10/07/2012   GAD (generalized anxiety disorder) 10/07/2012    Past Surgical History:  Procedure Laterality Date   CESAREAN SECTION     FOOT SURGERY Bilateral    WISDOM TOOTH EXTRACTION      Current Outpatient Medications  Medication Sig Dispense Refill   acetaminophen (TYLENOL) 500 MG tablet Take 500 mg by mouth every 6 (six) hours as needed.     clindamycin-benzoyl peroxide (BENZACLIN) gel Apply topically 2 (two) times daily. 25 g 0   cyclobenzaprine (FLEXERIL) 5 MG tablet TAKE 1 TABLET BY MOUTH THREE TIMES A DAY AS NEEDED FOR MUSCLE SPASMS 60 tablet 11   fluticasone (FLONASE) 50 MCG/ACT nasal spray Place 2 sprays into both nostrils daily. 16 g 6   methylPREDNISolone (MEDROL DOSEPAK) 4 MG TBPK tablet Take pills all together in the morning with food x 6 days. 21 tablet 1   MIRENA, 52 MG, 20 MCG/24HR IUD      naratriptan (AMERGE) 2.5 MG tablet Take twice daily for 3 days then as needed up to two times a day for migraine. 9 tablet 6   ondansetron (ZOFRAN ODT) 4 MG disintegrating tablet Take 1 tablet (4 mg total) by mouth every 8 (eight) hours as needed for nausea or vomiting. 30 tablet 3   Rimegepant Sulfate (NURTEC) 75 MG TBDP Take 75 mg by mouth once as needed for up to 1 dose. Try to take as soon as possible for migraine. Once daily.  10 tablet 11   topiramate (TOPAMAX) 100 MG tablet Take 1 tablet (100 mg total) by mouth 2 (two) times daily. 180 tablet 4   Ubrogepant (UBRELVY) 50 MG TABS Take 50-100 mg by mouth every 2 (two) hours as needed. Max 200mg  a day 10 tablet 6   zolpidem (AMBIEN) 10 MG tablet Take 1 tablet (10 mg total) by mouth at bedtime as needed for up to 30 days for sleep. 30 tablet 0   Current Facility-Administered Medications  Medication Dose Route Frequency Provider Last Rate Last Dose   cyanocobalamin ((VITAMIN B-12)) injection 1,000 mcg  1,000 mcg Intramuscular Weekly Daphine DeutscherMartin, [name], FNP   1,000 mcg at 01/21/19 1331    Allergies as of 01/26/2019   (No Known Allergies)  Vitals: There were no vitals taken for this visit. Last Weight:  Wt Readings from Last 1 Encounters:  12/31/18 143 lb (64.9 kg)   Last Height:   Ht Readings from Last 1 Encounters:  12/31/18 5\' 1"  (1.549 m)     Physical exam: Exam: Physical exam: Exam: Gen: NAD, conversant      CV: attempted, Could not perform over Web Video. Denies palpitations or chest pain or SOB. VS: Breathing at a normal rate. Weight appears within normal limits. Not febrile. Eyes: Conjunctivae clear without exudates or hemorrhage  Neuro: Detailed Neurologic Exam  Speech:    Speech is normal; fluent and spontaneous with normal comprehension.  Cognition:    The patient is oriented to person, place, and time;     recent and remote memory intact;     language fluent;     normal attention, concentration,     fund of knowledge Cranial Nerves:    The pupils are equal, round, and reactive to light. Attempted, Cannot perform fundoscopic exam. Visual fields are full to finger confrontation. Extraocular movements are intact.  The face is symmetric with normal sensation. The palate elevates in the midline. Hearing intact. Voice is normal. Shoulder shrug is normal. The tongue has normal motion without fasciculations.   Coordination:     Normal finger to nose  Gait:    Normal native gait  Motor Observation:   no involuntary movements noted. Tone:    Appears normal  Posture:    Posture is normal. normal erect    Strength:    Strength is anti-gravity and symmetric in the upper and lower limbs.      Sensation: intact to LT     Reflex Exam:  DTR's:    Attempted, Could not perform over Web Video   Toes: Attempted Could not perform over Web Video  Clonus:   Attempted, Could not perform over Web Video     Assessment/Plan:  33 year old with intractable chronic migraines, no aura, no medication overuse. She has had one appointment and a botox treatment a few weeks ago. This is our first botox. Baseline is daily headaches, 10-15 migraine days a month. +OO, +Masseters. Dry needling for myofascial cervical pain syndrome. Had some neck pain after the botox treatments. She troed [place] and t helped with the migraine. She is on: Nurtec, Ubrelvy, Topamax 100mg  twice daily, naratriptan, flexeril. Also stress at work. Performed nerve blocks today as well (charge just for appointment time)   Started botox   Stop Ajovy, stop amitriptyline (she gained weight) Look into nerve blocks if frova and steroids don;t break current cycle.- performed nerve blocks today Frova x 3 days and a medrol dosepak for current status migrainosus ordered last appointment  Performed by Dr. Jaynee Eagles M.D. All procedures a documented blood were medically necessary, reasonable and appropriate based on the patient's history, medical diagnosis and physician opinion. Verbal informed consent was obtained from the patient, patient was informed of potential risk of procedure, including bruising, bleeding, hematoma formation, infection, muscle weakness, muscle pain, numbness, transient hypertension, transient hyperglycemia and transient insomnia among others. All areas injected were topically clean with isopropyl rubbing alcohol. Nonsterile nonlatex gloves were worn  during the procedure.  1. Greater occipital nerve block [phone]). The greater occipital nerve site was identified at the nuchal line medial to the occipital artery. Medication was injected into the left and right occipital nerve areas and suboccipital areas. Patient's condition is associated with inflammation of the greater occipital nerve and associated  multiple groups. Injection was deemed medically necessary, reasonable and appropriate. Injection represents a separate and unique surgical service.  2.Supraorbital nerve block ([id number]): Supraorbital nerve site was identified along the incision of the frontal bone on the orbital/supraorbital ridge. Medication was injected into the left and right supraorbital nerve areas. Patient's condition is associated with inflammation of the supraorbital and associated muscle groups. Injection was deemed medically necessary, reasonable and appropriate. Injection represents a separate and unique surgical service.   A total of 25 minutes was spent face-to-face with this patient. Over half this time was spent on counseling patient on the  1. Chronic migraine without aura without status migrainosus, not intractable   2. Migraine without status migrainosus, not intractable, unspecified migraine type   3. Nausea   4. Neck muscle spasm    diagnosis and different diagnostic and therapeutic options, counseling and coordination of care, risks ans benefits of management, compliance, or risk factor reduction and education.      Cc: Daphine DeutscherMartin, [name], *,    Naomie DeanAntonia Ahmiyah Coil, MD  [facility] [address] [place], [place] [id number]  Phone [phone] Fax [phone]

## 2019-02-01 MED FILL — ZOLPIDEM TARTRATE 10 MG TAB: 10 | 30 days supply | Qty: 30 | Fill #0

## 2019-02-04 ENCOUNTER — Other Ambulatory Visit: Payer: Self-pay

## 2019-02-04 ENCOUNTER — Other Ambulatory Visit (INDEPENDENT_AMBULATORY_CARE_PROVIDER_SITE_OTHER): Payer: 59

## 2019-02-04 DIAGNOSIS — E538 Deficiency of other specified B group vitamins: Secondary | ICD-10-CM | POA: Diagnosis not present

## 2019-02-04 MED ORDER — CYANOCOBALAMIN 1000 MCG/ML IJ SOLN
1000.0000 ug | Freq: Once | INTRAMUSCULAR | Status: AC
  Administered 2019-02-04: 1000 ug via INTRAMUSCULAR

## 2019-02-15 ENCOUNTER — Telehealth: Payer: Self-pay

## 2019-02-15 MED ORDER — MINOCYCLINE HCL 100 MG PO CAPS: 100.0000 mg | ORAL_CAPSULE | Freq: Two times a day (BID) | ORAL | 1 refills | Status: DC

## 2019-02-15 MED FILL — MINOCYCLINE 100 MG CAPSULE: 100 | 15 days supply | Qty: 30 | Fill #0

## 2019-02-15 NOTE — Telephone Encounter (Signed)
Patient would like a RF on her minocycline. Please review and advise

## 2019-02-15 NOTE — Telephone Encounter (Signed)
Minocycline rx sent to [facility]

## 2019-02-18 ENCOUNTER — Ambulatory Visit (INDEPENDENT_AMBULATORY_CARE_PROVIDER_SITE_OTHER): Payer: 59 | Admitting: *Deleted

## 2019-02-18 ENCOUNTER — Other Ambulatory Visit: Payer: Self-pay

## 2019-02-18 DIAGNOSIS — E538 Deficiency of other specified B group vitamins: Secondary | ICD-10-CM | POA: Diagnosis not present

## 2019-02-18 MED ORDER — CYANOCOBALAMIN 1000 MCG/ML IJ SOLN
1000.0000 ug | INTRAMUSCULAR | Status: AC
  Administered 2019-02-18 – 2019-05-26 (×3): 1000 ug via INTRAMUSCULAR

## 2019-02-18 NOTE — Progress Notes (Signed)
Pt given Cyanocobalamin inj Tolerated well 

## 2019-02-21 NOTE — Telephone Encounter (Signed)
If you are taking for acne- you only need 1 a day

## 2019-02-22 MED FILL — UBRELVY 50 MG TABS: 50 | 30 days supply | Qty: 10 | Fill #1

## 2019-02-22 MED FILL — TOPIRAMATE 100 MG TABLET: 100 | 90 days supply | Qty: 180 | Fill #0

## 2019-02-22 MED FILL — NURTEC 75 MG TBDP: 75 | 30 days supply | Qty: 8 | Fill #1

## 2019-03-03 DIAGNOSIS — G43001 Migraine without aura, not intractable, with status migrainosus: Secondary | ICD-10-CM | POA: Diagnosis not present

## 2019-03-03 NOTE — Telephone Encounter (Signed)
Called pt. Advised Dr. Jaynee Eagles approved migraine infusion. Pt at work right now and she works close to our office. She placed me on hold and got approval from boss to come for infusion and will come now. Spoke with infusion suite and they can fit her in as long as she gets to our office prior to 3:30pm. Gave completed/signed orders to infusion suite.

## 2019-03-07 ENCOUNTER — Other Ambulatory Visit (INDEPENDENT_AMBULATORY_CARE_PROVIDER_SITE_OTHER): Payer: 59

## 2019-03-07 ENCOUNTER — Other Ambulatory Visit: Payer: Self-pay

## 2019-03-07 DIAGNOSIS — E538 Deficiency of other specified B group vitamins: Secondary | ICD-10-CM | POA: Diagnosis not present

## 2019-03-07 MED ORDER — CYANOCOBALAMIN 1000 MCG/ML IJ SOLN
1000.0000 ug | Freq: Once | INTRAMUSCULAR | Status: AC
  Administered 2019-03-07: 1000 ug via INTRAMUSCULAR

## 2019-03-15 ENCOUNTER — Other Ambulatory Visit: Payer: Self-pay | Admitting: Nurse Practitioner

## 2019-03-15 DIAGNOSIS — F5101 Primary insomnia: Secondary | ICD-10-CM

## 2019-03-15 MED ORDER — ZOLPIDEM TARTRATE 10 MG PO TABS: 10.0000 mg | ORAL_TABLET | Freq: Every evening | ORAL | 0 refills | Status: DC | PRN

## 2019-03-15 MED FILL — ZOLPIDEM TARTRATE 10 MG TAB: 10 | 30 days supply | Qty: 30 | Fill #0

## 2019-03-16 MED FILL — MINOCYCLINE 100 MG CAPSULE: 100 | 15 days supply | Qty: 30 | Fill #1

## 2019-03-21 DIAGNOSIS — Z0289 Encounter for other administrative examinations: Secondary | ICD-10-CM

## 2019-03-25 DIAGNOSIS — H5213 Myopia, bilateral: Secondary | ICD-10-CM | POA: Diagnosis not present

## 2019-03-25 DIAGNOSIS — R51 Headache: Secondary | ICD-10-CM | POA: Diagnosis not present

## 2019-03-25 DIAGNOSIS — M542 Cervicalgia: Secondary | ICD-10-CM | POA: Diagnosis not present

## 2019-03-29 ENCOUNTER — Telehealth: Payer: Self-pay | Admitting: *Deleted

## 2019-03-29 NOTE — Telephone Encounter (Signed)
Pt [employer] form on [place] desk.  

## 2019-03-30 NOTE — Telephone Encounter (Signed)
FMLA forms completed, signed and sent to medical records for processing. 

## 2019-04-04 ENCOUNTER — Telehealth: Payer: Self-pay | Admitting: Neurology

## 2019-04-04 NOTE — Telephone Encounter (Signed)
FMLA paperwork faxed to [employer] 03/31/19.

## 2019-04-11 NOTE — Progress Notes (Signed)
Consent Form Botulism Toxin Injection For Chronic Migraine  This is our second botox. Baseline is daily headaches, 10-15 migraine days a month. +a. Dry needling for myofascial cervical pain syndrome - she declined. She improved to 50% migraine frequency. Nurtec helps more than Ubrelvy. She took all three of the CGRPs.    Reviewed orally with patient, additionally signature is on file:  Botulism toxin has been approved by the Federal drug administration for treatment of chronic migraine. Botulism toxin does not cure chronic migraine and it may not be effective in some patients.  The administration of botulism toxin is accomplished by injecting a small amount of toxin into the muscles of the neck and head. Dosage must be titrated for each individual. Any benefits resulting from botulism toxin tend to wear off after 3 months with a repeat injection required if benefit is to be maintained. Injections are usually done every 3-4 months with maximum effect peak achieved by about 2 or 3 weeks. Botulism toxin is expensive and you should be sure of what costs you will incur resulting from the injection.  The side effects of botulism toxin use for chronic migraine may include:   -Transient, and usually mild, facial weakness with facial injections  -Transient, and usually mild, head or neck weakness with head/neck injections  -Reduction or loss of forehead facial animation due to forehead muscle weakness  -Eyelid drooping  -Dry eye  -Pain at the site of injection or bruising at the site of injection  -Double vision  -Potential unknown long term risks  Contraindications: You should not have Botox if you are pregnant, nursing, allergic to albumin, have an infection, skin condition, or muscle weakness at the site of the injection, or have myasthenia gravis, Lambert-Eaton syndrome, or ALS.  It is also possible that as with any injection, there may be an allergic reaction or no effect from the  medication. Reduced effectiveness after repeated injections is sometimes seen and rarely infection at the injection site may occur. All care will be taken to prevent these side effects. If therapy is given over a long time, atrophy and wasting in the muscle injected may occur. Occasionally the patient's become refractory to treatment because they develop antibodies to the toxin. In this event, therapy needs to be modified.  I have read the above information and consent to the administration of botulism toxin.    BOTOX PROCEDURE NOTE FOR MIGRAINE HEADACHE    Contraindications and precautions discussed with patient(above). Aseptic procedure was observed and patient tolerated procedure. Procedure performed by Dr. Georgia Dom  The condition has existed for more than 6 months, and pt does not have a diagnosis of ALS, Myasthenia Gravis or Lambert-Eaton Syndrome.  Risks and benefits of injections discussed and pt agrees to proceed with the procedure.  Written consent obtained  These injections are medically necessary. Pt  receives good benefits from these injections. These injections do not cause sedations or hallucinations which the oral therapies may cause.  Description of procedure:  The patient was placed in a sitting position. The standard protocol was used for Botox as follows, with 5 units of Botox injected at each site:   -Procerus muscle, midline injection  -Corrugator muscle, bilateral injection  -Frontalis muscle, bilateral injection, with 2 sites each side, medial injection was performed in the upper one third of the frontalis muscle, in the region vertical from the medial inferior edge of the superior orbital rim. The lateral injection was again in the upper one third of  the forehead vertically above the lateral limbus of the cornea, 1.5 cm lateral to the medial injection site.  - Levator Scapulae: 5 units bilaterally  -Temporalis muscle injection, 5 sites, bilaterally. The first  injection was 3 cm above the tragus of the ear, second injection site was 1.5 cm to 3 cm up from the first injection site in line with the tragus of the ear. The third injection site was 1.5-3 cm forward between the first 2 injection sites. The fourth injection site was 1.5 cm posterior to the second injection site. 5th site laterally in the temporalis  muscleat the level of the outer canthus.  - Patient feels her clenching is a trigger for headaches. +5 units masseter bilaterally   - Patient feels the migraines are centered around the eyes +5 units bilaterally at the outer canthus in the orbicularis occuli  -Occipitalis muscle injection, 3 sites, bilaterally. The first injection was done one half way between the occipital protuberance and the tip of the mastoid process behind the ear. The second injection site was done lateral and superior to the first, 1 fingerbreadth from the first injection. The third injection site was 1 fingerbreadth superiorly and medially from the first injection site.  -Cervical paraspinal muscle injection, 2 sites, bilateral knee first injection site was 1 cm from the midline of the cervical spine, 3 cm inferior to the lower border of the occipital protuberance. The second injection site was 1.5 cm superiorly and laterally to the first injection site.  -Trapezius muscle injection was performed at 3 sites, bilaterally. The first injection site was in the upper trapezius muscle halfway between the inflection point of the neck, and the acromion. The second injection site was one half way between the acromion and the first injection site. The third injection was done between the first injection site and the inflection point of the neck.   Will return for repeat injection in 3 months.   A 200 unit sof Botox was used, any Botox not injected was wasted. The patient tolerated the procedure well, there were no complications of the above procedure.     

## 2019-04-12 ENCOUNTER — Other Ambulatory Visit: Payer: Self-pay

## 2019-04-12 ENCOUNTER — Ambulatory Visit (INDEPENDENT_AMBULATORY_CARE_PROVIDER_SITE_OTHER): Payer: 59 | Admitting: Neurology

## 2019-04-12 VITALS — Temp 98.6°F

## 2019-04-12 DIAGNOSIS — G43711 Chronic migraine without aura, intractable, with status migrainosus: Secondary | ICD-10-CM | POA: Diagnosis not present

## 2019-04-12 MED ORDER — KETOROLAC TROMETHAMINE 60 MG/2ML IM SOLN: INTRAMUSCULAR | 4 refills | Status: DC

## 2019-04-12 MED ORDER — KETOROLAC TROMETHAMINE 60 MG/2ML IM SOLN
60.0000 mg | Freq: Once | INTRAMUSCULAR | Status: AC
  Administered 2019-04-12: 60 mg via INTRAMUSCULAR

## 2019-04-12 NOTE — Patient Instructions (Signed)
Ketorolac injection What is this medicine? KETOROLAC (kee toe ROLE ak) is a non-steroidal anti-inflammatory drug (NSAID). It is used to treat moderate to severe pain for up to 5 days. It is commonly used after surgery. This medicine should not be used for more than 5 days. This medicine may be used for other purposes; ask your health care provider or pharmacist if you have questions. COMMON BRAND NAME(S): Toradol What should I tell my health care provider before I take this medicine? They need to know if you have any of these conditions:  asthma, especially aspirin-sensitive asthma  bleeding problems  coronary artery bypass graft (CABG) surgery within the past 2 weeks  kidney disease  stomach bleed, ulcer, or other problem  taking aspirin, other NSAID, or probenecid  an unusual or allergic reaction to ketorolac, tromethamine, aspirin, other NSAIDs, other medicines, foods, dyes or preservatives  pregnant or trying to get pregnant  breast-feeding How should I use this medicine? This medicine is for injection into a muscle or into a vein. It is given by a health care professional in a hospital or clinic setting. Talk to your pediatrician regarding the use of this medicine in children. Special care may be needed. Patients over 65 years old may have a stronger reaction and need a smaller dose. Overdosage: If you think you have taken too much of this medicine contact a poison control center or emergency room at once. NOTE: This medicine is only for you. Do not share this medicine with others. What if I miss a dose? This does not apply. What may interact with this medicine? Do not take this medicine with any of the following medications:  aspirin and aspirin-like medicines  cidofovir  methotrexate  NSAIDs, medicines for pain and inflammation, like ibuprofen or naproxen  pentoxifylline  probenecid This medicine may also interact with the following  medications:  alcohol  alendronate  alprazolam  carbamazepine  diuretics  flavocoxid  fluoxetine  ginkgo  lithium  medicines for blood pressure like enalapril  medicines that affect platelets like pentoxifylline  medicines that treat or prevent blood clots like heparin, warfarin  muscle relaxants  pemetrexed  phenytoin  thiothixene This list may not describe all possible interactions. Give your health care provider a list of all the medicines, herbs, non-prescription drugs, or dietary supplements you use. Also tell them if you smoke, drink alcohol, or use illegal drugs. Some items may interact with your medicine. What should I watch for while using this medicine? Tell your doctor or healthcare provider if your symptoms do not start to get better or if they get worse. This medicine may cause serious skin reactions. They can happen weeks to months after starting the medicine. Contact your healthcare provider right away if you notice fevers or flu-like symptoms with a rash. The rash may be red or purple and then turn into blisters or peeling of the skin. Or, you might notice a red rash with swelling of the face, lips or lymph nodes in your neck or under your arms. This medicine does not prevent heart attack or stroke. In fact, this medicine may increase the chance of a heart attack or stroke. The chance may increase with longer use of this medicine and in people who have heart disease. If you take aspirin to prevent heart attack or stroke, talk with your doctor or healthcare provider. Do not take medicines such as ibuprofen and naproxen with this medicine. Side effects such as stomach upset, nausea, or ulcers may be   more likely to occur. Many medicines available without a prescription should not be taken with this medicine. This medicine can cause ulcers and bleeding in the stomach and intestines at any time during treatment. Do not smoke cigarettes or drink alcohol. These  increase irritation to your stomach and can make it more susceptible to damage from this medicine. Ulcers and bleeding can happen without warning symptoms and can cause death. This medicine can cause you to bleed more easily. Try to avoid damage to your teeth and gums when you brush or floss your teeth. What side effects may I notice from receiving this medicine? Side effects that you should report to your doctor or health care professional as soon as possible:  allergic reactions like skin rash, itching or hives, swelling of the face, lips, or tongue  breathing problems  high blood pressure  nausea, vomiting  redness, blistering, peeling or loosening of the skin, including inside the mouth  severe stomach pain  signs and symptoms of bleeding such as bloody or black, tarry stools; red or dark-brown urine; spitting up blood or brown material that looks like coffee grounds; red spots on the skin; unusual bruising or bleeding from the eye, gums, or nose  signs and symptoms of a blood clot changes in vision; chest pain; severe, sudden headache; trouble speaking; sudden numbness or weakness of the face, arm, or leg  trouble passing urine or change in the amount of urine  unexplained weight gain or swelling  unusually weak or tired  yellowing of eyes or skin Side effects that usually do not require medical attention (report to your doctor or health care professional if they continue or are bothersome):  diarrhea  dizziness  headache  heartburn This list may not describe all possible side effects. Call your doctor for medical advice about side effects. You may report side effects to FDA at 1-800-FDA-1088. Where should I keep my medicine? This drug is given in a hospital or clinic and will not be stored at home. NOTE: This sheet is a summary. It may not cover all possible information. If you have questions about this medicine, talk to your doctor, pharmacist, or health care  provider.  2020 Elsevier/Gold Standard (2018-09-08 15:10:53)  

## 2019-04-12 NOTE — Progress Notes (Signed)
toBotox- 200 units x 1 vial Lot: [id number] Expiration: 11/2021 NDC: 3887-1959-74  Bacteriostatic 0.9% Sodium Chloride- 68mL total Lot: [id number] Expiration: 01/05/2020 NDC: 1586-8257-49  Dx: T55.217 B/B

## 2019-04-13 ENCOUNTER — Telehealth: Payer: Self-pay | Admitting: Neurology

## 2019-04-13 NOTE — Telephone Encounter (Signed)
pharmacy called wanting to know if they can give 42ml 23gauge 1.5in needle for Erica Rodriguez ketorolac (TORADOL) 60 MG/2ML SOLN injection they dont carry the 5x23gage 2 inch needles and 5 x 28ml syringe

## 2019-04-13 NOTE — Telephone Encounter (Signed)
Per Dr. Jaynee Eagles, order was supposed to be shorter needles, not 2 inch. I called the pharmacy back and spoke with Manuela Schwartz. Gave v.o. to provide 3 mL syringes with 23 gauge 1 inch needles.

## 2019-04-18 MED FILL — NURTEC 75 MG TBDP: 75 | 30 days supply | Qty: 8 | Fill #2

## 2019-04-18 MED FILL — UBRELVY 50 MG TABS: 50 | 30 days supply | Qty: 10 | Fill #2

## 2019-04-19 ENCOUNTER — Other Ambulatory Visit: Payer: Self-pay

## 2019-04-20 ENCOUNTER — Ambulatory Visit (INDEPENDENT_AMBULATORY_CARE_PROVIDER_SITE_OTHER): Payer: 59

## 2019-04-20 DIAGNOSIS — E538 Deficiency of other specified B group vitamins: Secondary | ICD-10-CM | POA: Diagnosis not present

## 2019-04-20 NOTE — Progress Notes (Signed)
Cyanocobalamin injection given to right deltoid.  Patient tolerated well. 

## 2019-04-24 ENCOUNTER — Other Ambulatory Visit: Payer: Self-pay | Admitting: Neurology

## 2019-04-24 MED ORDER — METHYLPREDNISOLONE 4 MG PO TBPK: ORAL_TABLET | ORAL | 1 refills | Status: DC

## 2019-04-25 ENCOUNTER — Other Ambulatory Visit: Payer: Self-pay | Admitting: Nurse Practitioner

## 2019-04-25 DIAGNOSIS — F5101 Primary insomnia: Secondary | ICD-10-CM

## 2019-04-25 MED ORDER — ZOLPIDEM TARTRATE 10 MG PO TABS: 10.0000 mg | ORAL_TABLET | Freq: Every evening | ORAL | 2 refills | Status: DC | PRN

## 2019-04-25 MED FILL — METHYLPREDNISOLONE 4 MG TAB: 4 | 6 days supply | Qty: 21 | Fill #0

## 2019-04-25 MED FILL — ZOLPIDEM TARTRATE 10 MG TAB: 10 | 30 days supply | Qty: 30 | Fill #0

## 2019-04-27 MED FILL — CYCLOBENZAPRINE 5 MG TABLET: 5 | 20 days supply | Qty: 60 | Fill #1

## 2019-04-28 ENCOUNTER — Other Ambulatory Visit: Payer: Self-pay

## 2019-04-28 MED ORDER — BUSPIRONE HCL 15 MG PO TABS: 15.0000 mg | ORAL_TABLET | Freq: Three times a day (TID) | ORAL | 0 refills | Status: DC

## 2019-04-29 MED FILL — busPIRone HCL 15 MG TABS: 15 | 30 days supply | Qty: 90 | Fill #0

## 2019-05-03 MED FILL — METHYLPREDNISOLONE 4 MG TAB: 4 | 6 days supply | Qty: 21 | Fill #1

## 2019-05-16 ENCOUNTER — Other Ambulatory Visit: Payer: Self-pay

## 2019-05-16 ENCOUNTER — Encounter: Payer: Self-pay | Admitting: Family Medicine

## 2019-05-16 ENCOUNTER — Ambulatory Visit: Payer: 59 | Admitting: Family Medicine

## 2019-05-16 VITALS — BP 125/85 | HR 101 | Temp 98.7°F | Ht 61.0 in | Wt 148.4 lb

## 2019-05-16 DIAGNOSIS — G8929 Other chronic pain: Secondary | ICD-10-CM

## 2019-05-16 DIAGNOSIS — M546 Pain in thoracic spine: Secondary | ICD-10-CM | POA: Diagnosis not present

## 2019-05-16 MED ORDER — TIZANIDINE HCL 6 MG PO CAPS: 6.0000 mg | ORAL_CAPSULE | Freq: Three times a day (TID) | ORAL | 1 refills | Status: DC | PRN

## 2019-05-16 MED ORDER — PREDNISONE 10 MG PO TABS: ORAL_TABLET | ORAL | 0 refills | Status: DC

## 2019-05-16 MED FILL — predniSONE 10 MG TABS: 10 | 15 days supply | Qty: 45 | Fill #0

## 2019-05-16 MED FILL — tiZANidine HCL 6 MG CAPS: 6 | 30 days supply | Qty: 90 | Fill #0

## 2019-05-16 MED FILL — KETOROLAC TROMETHAMINE 60 M: 60 | 5 days supply | Qty: 10 | Fill #1

## 2019-05-16 NOTE — Progress Notes (Signed)
Chief Complaint  Patient presents with  . Shoulder Pain    right x 2 months and states goes up neck at times    HPI  Patient presents today for right posterior shoulder pain radiating into the posterior neck, triggering a migraine has tried methylprednisolone and flexeril without relief. It hurts to flex at the shoulder as well as abduct. NKI. Onset 2 mos. Getting worse.  PMH: Smoking status noted ROS: Per HPI  Objective: BP 125/85   Pulse (!) 101   Temp 98.7 F (37.1 C) (Temporal)   Ht 5\' 1"  (1.549 m)   Wt 148 lb 6.4 oz (67.3 kg)   SpO2 100%   BMI 28.04 kg/m  Gen: NAD, alert, cooperative with exam HEENT: NCAT, EOMI, PERRL CV: RRR, good S1/S2, no murmur Resp: CTABL, no wheezes, non-labored Ext: No edema, warm. trender spasm at the right mid back medial to the scapular border. Radiated tenderness to the posterior cervical musculature on the right Neuro: Alert and oriented, No gross deficits  Assessment and plan:  1. Chronic right-sided thoracic back pain     Meds ordered this encounter  Medications  . predniSONE (DELTASONE) 10 MG tablet    Sig: Take 5 daily for 3 days followed by 4,3,2 and 1 for 3 days each.    Dispense:  45 tablet    Refill:  0  . tizanidine (ZANAFLEX) 6 MG capsule    Sig: Take 1 capsule (6 mg total) by mouth 3 (three) times daily as needed for muscle spasms.    Dispense:  90 capsule    Refill:  1    Consider P.T. pt. Wants to see how this tx does first.  Follow up as needed.  Claretta Fraise, MD

## 2019-05-26 ENCOUNTER — Ambulatory Visit: Payer: 59 | Admitting: Nurse Practitioner

## 2019-05-26 ENCOUNTER — Encounter: Payer: Self-pay | Admitting: Nurse Practitioner

## 2019-05-26 ENCOUNTER — Other Ambulatory Visit: Payer: Self-pay

## 2019-05-26 VITALS — BP 142/90 | HR 104 | Temp 97.8°F | Resp 20 | Ht 61.0 in | Wt 148.0 lb

## 2019-05-26 DIAGNOSIS — S161XXA Strain of muscle, fascia and tendon at neck level, initial encounter: Secondary | ICD-10-CM | POA: Diagnosis not present

## 2019-05-26 DIAGNOSIS — E538 Deficiency of other specified B group vitamins: Secondary | ICD-10-CM | POA: Diagnosis not present

## 2019-05-26 MED ORDER — CYANOCOBALAMIN 1000 MCG/ML IJ SOLN: 1000.0000 ug | INTRAMUSCULAR | 1 refills | Status: DC

## 2019-05-26 MED ORDER — BUSPIRONE HCL 15 MG PO TABS: 15.0000 mg | ORAL_TABLET | Freq: Three times a day (TID) | ORAL | 1 refills | Status: DC

## 2019-05-26 MED ORDER — CYANOCOBALAMIN 1000 MCG/ML IJ SOLN: 1000.0000 ug | Freq: Once | INTRAMUSCULAR | 1 refills | Status: DC

## 2019-05-26 MED ORDER — NAPROXEN 500 MG PO TABS: 500.0000 mg | ORAL_TABLET | Freq: Two times a day (BID) | ORAL | 1 refills | Status: DC

## 2019-05-26 MED FILL — NAPROXEN 500 MG TABLET: 500 | 30 days supply | Qty: 60 | Fill #0

## 2019-05-26 MED FILL — CYANOCOBALAMIN 1,000 MCG/ML: 1000 | 90 days supply | Qty: 3 | Fill #0

## 2019-05-26 MED FILL — busPIRone HCL 15 MG TABS: 15 | 30 days supply | Qty: 90 | Fill #0

## 2019-05-26 NOTE — Patient Instructions (Signed)

## 2019-05-26 NOTE — Addendum Note (Signed)
Addended by: Rolena Infante on: 05/26/2019 01:58 PM   Modules accepted: Orders

## 2019-05-26 NOTE — Addendum Note (Signed)
Addended by: Chevis Pretty on: 05/26/2019 09:29 AM   Modules accepted: Orders

## 2019-05-26 NOTE — Progress Notes (Signed)
   Subjective:    Patient ID: Erica Rodriguez, female    DOB: 03-Jun-1986, 33 y.o.   MRN: [id number]   Chief Complaint: Shoulder Pain (Right) and Neck Pain   HPI Patient comes in today c/o of right shoulder and upper back pain. She saw Dr. Riccardo Dubin last week and he gave her a steroid dose pack and tizanidine. She is at the end of steroid dose pack and it helped at first but is not helping now. Rates pain 9/10 today. Any movement makes it worse. Sitting very still makes it better. Heat helps some.    Review of Systems  Constitutional: Negative for activity change and appetite change.  HENT: Negative.   Eyes: Negative for pain.  Respiratory: Negative for shortness of breath.   Cardiovascular: Negative for chest pain, palpitations and leg swelling.  Gastrointestinal: Negative for abdominal pain.  Endocrine: Negative for polydipsia.  Genitourinary: Negative.   Musculoskeletal: Positive for arthralgias (right shoulder).  Skin: Negative for rash.  Neurological: Negative for dizziness, weakness and headaches.  Hematological: Does not bruise/bleed easily.  Psychiatric/Behavioral: Negative.   All other systems reviewed and are negative.      Objective:   Physical Exam Vitals signs and nursing note reviewed.  Constitutional:      Appearance: Normal appearance.  Cardiovascular:     Rate and Rhythm: Normal rate and regular rhythm.     Heart sounds: Normal heart sounds.  Pulmonary:     Breath sounds: Normal breath sounds.  Musculoskeletal:     Comments: FROM of right shoulder without pain FROM of cervical spine with pain on rotation to right and leaning to right Pain on palpation along sternocleidomastoid musce Grips equal bil  Skin:    General: Skin is warm.  Neurological:     General: No focal deficit present.     Mental Status: She is oriented to person, place, and time.     Sensory: No sensory deficit.     Coordination: Coordination normal.    BP (!) 142/90   Pulse (!) 104    Temp 97.8 F (36.6 C) (Temporal)   Resp 20   Ht 5\' 1"  (1.549 m)   Wt 148 lb (67.1 kg)   SpO2 100%   BMI 27.96 kg/m         Assessment & Plan:  Erica Rodriguez in today with chief complaint of Shoulder Pain (Right) and Neck Pain   1. Strain of neck muscle, initial encounter Meds ordered this encounter  Medications  . naproxen (NAPROSYN) 500 MG tablet    Sig: Take 1 tablet (500 mg total) by mouth 2 (two) times daily with a meal.    Dispense:  60 tablet    Refill:  1    Order Specific Question:   Supervising Provider    Answer:   Caryl Pina A [[id number]]   Moist heat hen home salpona patches or lidociane patches while working Message area RTO prn  Chevis Pretty, [place]

## 2019-05-27 ENCOUNTER — Ambulatory Visit: Payer: 59

## 2019-05-30 MED FILL — ZOLPIDEM TARTRATE 10 MG TAB: 10 | 30 days supply | Qty: 30 | Fill #1

## 2019-05-30 MED FILL — TOPIRAMATE 100 MG TABLET: 100 | 90 days supply | Qty: 180 | Fill #1

## 2019-05-31 ENCOUNTER — Telehealth: Payer: Self-pay

## 2019-05-31 DIAGNOSIS — M2550 Pain in unspecified joint: Secondary | ICD-10-CM

## 2019-05-31 NOTE — Telephone Encounter (Signed)
Mom has RA and wonders if she has the same with all her aches and pains. Wants to know if you will orders labs for RA and she will come in

## 2019-06-04 ENCOUNTER — Telehealth: Payer: 59 | Admitting: Physician Assistant

## 2019-06-04 DIAGNOSIS — B9789 Other viral agents as the cause of diseases classified elsewhere: Secondary | ICD-10-CM

## 2019-06-04 DIAGNOSIS — J329 Chronic sinusitis, unspecified: Secondary | ICD-10-CM | POA: Diagnosis not present

## 2019-06-04 MED ORDER — AZELASTINE HCL 0.1 % NA SOLN: 2.0000 | Freq: Two times a day (BID) | NASAL | 12 refills | Status: DC

## 2019-06-04 NOTE — Progress Notes (Signed)
I have spent 5 minutes in review of e-visit questionnaire, review and updating patient chart, medical decision making and response to patient.   Pedrohenrique [place]on[place] Cody Maron Stanzione, PA-C    

## 2019-06-04 NOTE — Progress Notes (Signed)

## 2019-06-06 MED ORDER — AMOXICILLIN-POT CLAVULANATE 875-125 MG PO TABS: 1.0000 | ORAL_TABLET | Freq: Two times a day (BID) | ORAL | 0 refills | Status: DC

## 2019-06-06 NOTE — Addendum Note (Signed)
Addended by: Rodney Booze on: 06/06/2019 11:14 AM   Modules accepted: Orders

## 2019-06-07 NOTE — Telephone Encounter (Signed)
Ordered placed for arthritis panel.

## 2019-06-07 NOTE — Telephone Encounter (Signed)
Pt aware.

## 2019-06-08 ENCOUNTER — Other Ambulatory Visit: Payer: Self-pay | Admitting: Nurse Practitioner

## 2019-06-08 MED ORDER — NYSTATIN 100000 UNIT/ML MT SUSP: 5.0000 mL | Freq: Four times a day (QID) | OROMUCOSAL | 0 refills | Status: DC

## 2019-06-08 MED FILL — NYSTATIN 100,000 UNITS/ML S: 100000 | 3 days supply | Qty: 60 | Fill #0

## 2019-06-08 NOTE — Progress Notes (Signed)
ati

## 2019-06-14 MED FILL — KETOROLAC TROMETHAMINE 60 M: 60 | 5 days supply | Qty: 10 | Fill #2

## 2019-06-16 MED FILL — UBRELVY 50 MG TABS: 50 | 30 days supply | Qty: 10 | Fill #3

## 2019-06-16 MED FILL — NURTEC 75 MG TBDP: 75 | 30 days supply | Qty: 8 | Fill #3

## 2019-06-17 ENCOUNTER — Telehealth: Payer: 59 | Admitting: Physician Assistant

## 2019-06-17 ENCOUNTER — Encounter: Payer: Self-pay | Admitting: Physician Assistant

## 2019-06-17 DIAGNOSIS — B370 Candidal stomatitis: Secondary | ICD-10-CM | POA: Diagnosis not present

## 2019-06-17 MED ORDER — NYSTATIN 100000 UNIT/ML MT SUSP: 5.0000 mL | Freq: Four times a day (QID) | OROMUCOSAL | 0 refills | Status: DC

## 2019-06-17 NOTE — Progress Notes (Signed)
We are sorry that you are not feeling well.  Here is how we plan to help!  Your symptoms indicate that you have oral thrush, since you indicated that you have white patches in your tongue, roof of the mouth, and throat. Use Nystatin units 4 times/day; swish in the mouth and retain for several minutes before swallowing. Medication has been sent to your pharmacy.   Oral thrush can cause inflammation in the back of the throat which can cause a sore throat, scratchiness and sometimes difficulty swallowing. It is evident by the white patches diffusely in the mouth, and not just in the throat. Please be mindful that pharyngitis can also be caused by a respiratory virus and will just run its course.  Please keep in mind that your symptoms could last up to 10 days.  For throat pain, we recommend over the counter oral pain relief medications such as acetaminophen or aspirin, or anti-inflammatory medications such as ibuprofen or naproxen sodium.  Topical treatments such as oral throat lozenges or sprays may be used as needed, as tolerated. Avoid close contact with loved ones, especially the very young and elderly.  Remember to wash your hands thoroughly throughout the day as this is the number one way to prevent the spread of infection and wipe down door knobs and counters with disinfectant.  If sore throat continues despite treatment for thrush, then follow up with [place] for possible other causes or you may need formal testing in clinic or office to confirm if your symptoms continue or worsen.    Home Care:  Only take medications as instructed by your medical team.  Do not drink alcohol while taking these medications.  A steam or ultrasonic humidifier can help congestion.  You can place a towel over your head and breathe in the steam from hot water coming from a faucet.  Avoid close contacts especially the very young and the elderly.  Cover your mouth when you cough or sneeze.  Always remember to wash your  hands.  Get Help Right Away If:  You develop worsening fever or throat pain.  You develop a severe head ache or visual changes.  Your symptoms persist after you have completed your treatment plan.  Make sure you  Understand these instructions.  Will watch your condition.  Will get help right away if you are not doing well or get worse.  Your e-visit answers were reviewed by a board certified advanced clinical practitioner to complete your personal care plan.  Depending on the condition, your plan could have included both over the counter or prescription medications.  If there is a problem please reply  once you have received a response from your provider.  Your safety is important to [place].  If you have drug allergies check your prescription carefully.    You can use MyChart to ask questions about todays visit, request a non-urgent call back, or ask for a work or school excuse for 24 hours related to this e-Visit. If it has been greater than 24 hours you will need to follow up with your provider, or enter a new e-Visit to address those concerns.  You will get an e-mail in the next two days asking about your experience.  I hope that your e-visit has been valuable and will speed your recovery. Thank you for using e-visits.   I spent 5-10 minutes on review and completion of this note- Lacy Duverney [facility]

## 2019-06-18 MED ORDER — NYSTATIN 100000 UNIT/ML MT SUSP: 5.0000 mL | Freq: Four times a day (QID) | OROMUCOSAL | 0 refills | Status: DC

## 2019-06-18 NOTE — Addendum Note (Signed)
Addended by: Chevis Pretty on: 06/18/2019 02:07 PM   Modules accepted: Orders

## 2019-06-24 ENCOUNTER — Other Ambulatory Visit: Payer: Self-pay

## 2019-06-24 ENCOUNTER — Other Ambulatory Visit: Payer: 59

## 2019-06-24 DIAGNOSIS — M2550 Pain in unspecified joint: Secondary | ICD-10-CM

## 2019-06-25 LAB — ARTHRITIS PANEL
Basophils Absolute: 0 10*3/uL (ref 0.0–0.2)
Basos: 1 %
EOS (ABSOLUTE): 0.2 10*3/uL (ref 0.0–0.4)
Eos: 2 %
Hematocrit: 38 % (ref 34.0–46.6)
Hemoglobin: 13.1 g/dL (ref 11.1–15.9)
Immature Grans (Abs): 0 10*3/uL (ref 0.0–0.1)
Immature Granulocytes: 0 %
Lymphocytes Absolute: 1.9 10*3/uL (ref 0.7–3.1)
Lymphs: 30 %
MCH: 33 pg (ref 26.6–33.0)
MCHC: 34.5 g/dL (ref 31.5–35.7)
MCV: 96 fL (ref 79–97)
Monocytes Absolute: 0.5 10*3/uL (ref 0.1–0.9)
Monocytes: 7 %
Neutrophils Absolute: 3.8 10*3/uL (ref 1.4–7.0)
Neutrophils: 60 %
Platelets: 421 10*3/uL (ref 150–450)
RBC: 3.97 x10E6/uL (ref 3.77–5.28)
RDW: 11.9 % (ref 11.7–15.4)
Rheumatoid fact SerPl-aCnc: <10 IU/mL (ref 0.0–13.9)
Sed Rate: 18 mm/hr (ref 0–32)
Uric Acid: 3.2 mg/dL (ref 2.6–6.2)
WBC: 6.3 10*3/uL (ref 3.4–10.8)

## 2019-06-28 MED FILL — ZOLPIDEM TARTRATE 10 MG TAB: 10 | 30 days supply | Qty: 30 | Fill #2

## 2019-06-28 MED FILL — busPIRone HCL 15 MG TABS: 15 | 30 days supply | Qty: 90 | Fill #1

## 2019-07-04 ENCOUNTER — Telehealth: Payer: Self-pay

## 2019-07-04 NOTE — Telephone Encounter (Signed)
PA done for Ubrelvy on cover my meds.  [employer] is reviewing your PA request. You may close this dialog, return to your dashboard, and perform other tasks. To check for an update later, open this request again from your dashboard. If [employer] has not replied within 24 hours for urgent requests or within 48 hours for standard requests, please contact [employer] at [phone]

## 2019-07-04 NOTE — Telephone Encounter (Signed)
PA for Nurtec done on cover my meds.[employer] is reviewing your PA request. You may close this dialog, return to your dashboard, and perform other tasks. To check for an update later, open this request again from your dashboard. If [employer] has not replied within 24 hours for urgent requests or within 48 hours for standard requests, please contact [employer] at [phone].

## 2019-07-05 NOTE — Telephone Encounter (Signed)
PA approve from 07/04/2019 to 07/02/2020 for up to 16 tablets per month. PA reference number [id number]. Contact number is [phone].

## 2019-07-05 NOTE — Telephone Encounter (Signed)
Ubelvy approve from 07/04/2019 to 07/02/2020 by [employer]. PA reference number is [id number]. Request approve up to 16 tablets per month.

## 2019-07-06 ENCOUNTER — Telehealth: Payer: Self-pay

## 2019-07-06 MED ORDER — HYDROXYZINE HCL 10 MG PO TABS: 10.0000 mg | ORAL_TABLET | Freq: Three times a day (TID) | ORAL | 0 refills | Status: DC | PRN

## 2019-07-06 MED FILL — hydrOXYzine HCL 10 MG TABS: 10 | 10 days supply | Qty: 30 | Fill #0

## 2019-07-06 NOTE — Telephone Encounter (Signed)
Buspar is causing face to break out bad. Can this be changed to something else? Please advise

## 2019-07-06 NOTE — Telephone Encounter (Signed)
Stop buspar- start atarax prn- rx sent to pharmacy

## 2019-07-08 HISTORY — PX: CARPAL TUNNEL RELEASE: SHX101

## 2019-07-08 HISTORY — PX: PLANTAR FASCIA RELEASE: SHX2239

## 2019-07-14 ENCOUNTER — Other Ambulatory Visit: Payer: Self-pay

## 2019-07-14 ENCOUNTER — Ambulatory Visit: Payer: 59 | Admitting: Neurology

## 2019-07-14 VITALS — Temp 97.4°F

## 2019-07-14 DIAGNOSIS — G43711 Chronic migraine without aura, intractable, with status migrainosus: Secondary | ICD-10-CM | POA: Diagnosis not present

## 2019-07-14 NOTE — Progress Notes (Signed)
Botox- 200 units x 1 vial Lot: [id number] Expiration: 02/2022 NDC: 0023-3921-02  Bacteriostatic 0.9% Sodium Chloride- 4mL total Lot: [id number] Expiration: 10/06/2019 NDC: 0409-1966-02  Dx: G43.711 B/B   

## 2019-07-14 NOTE — Progress Notes (Signed)
Consent Form Botulism Toxin Injection For Chronic Migraine  This is our second botox. Baseline is daily headaches, 10-15 migraine days a month. +a. Dry needling for myofascial cervical pain syndrome - she declined. She improved to 50% migraine frequency. Nurtec helps more than Ubrelvy. She took all three of the CGRPs.    Reviewed orally with patient, additionally signature is on file:  Botulism toxin has been approved by the Federal drug administration for treatment of chronic migraine. Botulism toxin does not cure chronic migraine and it may not be effective in some patients.  The administration of botulism toxin is accomplished by injecting a small amount of toxin into the muscles of the neck and head. Dosage must be titrated for each individual. Any benefits resulting from botulism toxin tend to wear off after 3 months with a repeat injection required if benefit is to be maintained. Injections are usually done every 3-4 months with maximum effect peak achieved by about 2 or 3 weeks. Botulism toxin is expensive and you should be sure of what costs you will incur resulting from the injection.  The side effects of botulism toxin use for chronic migraine may include:   -Transient, and usually mild, facial weakness with facial injections  -Transient, and usually mild, head or neck weakness with head/neck injections  -Reduction or loss of forehead facial animation due to forehead muscle weakness  -Eyelid drooping  -Dry eye  -Pain at the site of injection or bruising at the site of injection  -Double vision  -Potential unknown long term risks  Contraindications: You should not have Botox if you are pregnant, nursing, allergic to albumin, have an infection, skin condition, or muscle weakness at the site of the injection, or have myasthenia gravis, Lambert-Eaton syndrome, or ALS.  It is also possible that as with any injection, there may be an allergic reaction or no effect from the  medication. Reduced effectiveness after repeated injections is sometimes seen and rarely infection at the injection site may occur. All care will be taken to prevent these side effects. If therapy is given over a long time, atrophy and wasting in the muscle injected may occur. Occasionally the patient's become refractory to treatment because they develop antibodies to the toxin. In this event, therapy needs to be modified.  I have read the above information and consent to the administration of botulism toxin.    BOTOX PROCEDURE NOTE FOR MIGRAINE HEADACHE    Contraindications and precautions discussed with patient(above). Aseptic procedure was observed and patient tolerated procedure. Procedure performed by Dr. Georgia Dom  The condition has existed for more than 6 months, and pt does not have a diagnosis of ALS, Myasthenia Gravis or Lambert-Eaton Syndrome.  Risks and benefits of injections discussed and pt agrees to proceed with the procedure.  Written consent obtained  These injections are medically necessary. Pt  receives good benefits from these injections. These injections do not cause sedations or hallucinations which the oral therapies may cause.  Description of procedure:  The patient was placed in a sitting position. The standard protocol was used for Botox as follows, with 5 units of Botox injected at each site:   -Procerus muscle, midline injection  -Corrugator muscle, bilateral injection  -Frontalis muscle, bilateral injection, with 2 sites each side, medial injection was performed in the upper one third of the frontalis muscle, in the region vertical from the medial inferior edge of the superior orbital rim. The lateral injection was again in the upper one third of  the forehead vertically above the lateral limbus of the cornea, 1.5 cm lateral to the medial injection site.  - Levator Scapulae: 5 units bilaterally  -Temporalis muscle injection, 5 sites, bilaterally. The first  injection was 3 cm above the tragus of the ear, second injection site was 1.5 cm to 3 cm up from the first injection site in line with the tragus of the ear. The third injection site was 1.5-3 cm forward between the first 2 injection sites. The fourth injection site was 1.5 cm posterior to the second injection site. 5th site laterally in the temporalis  muscleat the level of the outer canthus.  - Patient feels her clenching is a trigger for headaches. +5 units masseter bilaterally   - Patient feels the migraines are centered around the eyes +5 units bilaterally at the outer canthus in the orbicularis occuli  -Occipitalis muscle injection, 3 sites, bilaterally. The first injection was done one half way between the occipital protuberance and the tip of the mastoid process behind the ear. The second injection site was done lateral and superior to the first, 1 fingerbreadth from the first injection. The third injection site was 1 fingerbreadth superiorly and medially from the first injection site.  -Cervical paraspinal muscle injection, 2 sites, bilateral knee first injection site was 1 cm from the midline of the cervical spine, 3 cm inferior to the lower border of the occipital protuberance. The second injection site was 1.5 cm superiorly and laterally to the first injection site.  -Trapezius muscle injection was performed at 3 sites, bilaterally. The first injection site was in the upper trapezius muscle halfway between the inflection point of the neck, and the acromion. The second injection site was one half way between the acromion and the first injection site. The third injection was done between the first injection site and the inflection point of the neck.   Will return for repeat injection in 3 months.   A 200 unit sof Botox was used, any Botox not injected was wasted. The patient tolerated the procedure well, there were no complications of the above procedure.

## 2019-07-15 ENCOUNTER — Telehealth: Payer: Self-pay | Admitting: Nurse Practitioner

## 2019-07-15 ENCOUNTER — Other Ambulatory Visit: Payer: Self-pay | Admitting: Nurse Practitioner

## 2019-07-15 MED ORDER — MINOCYCLINE HCL 100 MG PO CAPS: 100.0000 mg | ORAL_CAPSULE | Freq: Two times a day (BID) | ORAL | 1 refills | Status: AC

## 2019-07-15 MED FILL — MINOCYCLINE 100 MG CAPSULE: 100 | 30 days supply | Qty: 30 | Fill #0

## 2019-07-15 NOTE — Telephone Encounter (Signed)
Wants to verify whether patient should be taking the MINOCYCLINE Rx for 10 or 15 days. Says pt usually takes for 15 days but what we sent over is for 10 days.

## 2019-07-19 MED FILL — KETOROLAC TROMETHAMINE 60 M: 60 | 5 days supply | Qty: 10 | Fill #3

## 2019-07-25 ENCOUNTER — Other Ambulatory Visit: Payer: Self-pay | Admitting: *Deleted

## 2019-07-25 MED ORDER — METHYLPREDNISOLONE 4 MG PO TBPK: ORAL_TABLET | ORAL | 0 refills | Status: DC

## 2019-07-28 ENCOUNTER — Other Ambulatory Visit: Payer: Self-pay | Admitting: Nurse Practitioner

## 2019-07-28 DIAGNOSIS — F5101 Primary insomnia: Secondary | ICD-10-CM

## 2019-07-29 ENCOUNTER — Encounter: Payer: Self-pay | Admitting: Nurse Practitioner

## 2019-07-29 ENCOUNTER — Ambulatory Visit: Payer: 59 | Admitting: Nurse Practitioner

## 2019-07-29 DIAGNOSIS — F411 Generalized anxiety disorder: Secondary | ICD-10-CM

## 2019-07-29 DIAGNOSIS — F5101 Primary insomnia: Secondary | ICD-10-CM | POA: Diagnosis not present

## 2019-07-29 MED ORDER — ZOLPIDEM TARTRATE 10 MG PO TABS: 10.0000 mg | ORAL_TABLET | Freq: Every evening | ORAL | 5 refills | Status: DC | PRN

## 2019-07-29 MED FILL — ZOLPIDEM TARTRATE 10 MG TAB: 10 | 30 days supply | Qty: 30 | Fill #0

## 2019-07-29 NOTE — Progress Notes (Signed)
Virtual Visit via telephone Note Due to COVID-19 pandemic this visit was conducted virtually. This visit type was conducted due to national recommendations for restrictions regarding the COVID-19 Pandemic (e.g. social distancing, sheltering in place) in an effort to limit this patient's exposure and mitigate transmission in our community. All issues noted in this document were discussed and addressed.  A physical exam was not performed with this format.  I connected with Erica Rodriguez on 07/29/19 at 10:15 by telephone and verified that I am speaking with the correct person using two identifiers. Erica Rodriguez is currently located at work and no one is currently with her during visit. The provider, [name], FNP is located in their office at time of visit.  I discussed the limitations, risks, security and privacy concerns of performing an evaluation and management service by telephone and the availability of in person appointments. I also discussed with the patient that there may be a patient responsible charge related to this service. The patient expressed understanding and agreed to proceed.   History and Present Illness:   Chief Complaint: Medical Management of Chronic Issues    HPI:  1. Primary insomnia patient takes ambien to sleep at night. She has been taking for over 3 years and says that she cannot sleep without it.  2. GAD (generalized anxiety disorder) She says her anxiety is doing well. GAD 7 : Generalized Anxiety Score 07/29/2019  Nervous, Anxious, on Edge 0  Control/stop worrying 0  Worry too much - different things 0  Trouble relaxing 0  Restless 0  Easily annoyed or irritable 1  Afraid - awful might happen 0  Total GAD 7 Score 1  Anxiety Difficulty Not difficult at all        Outpatient Encounter Medications as of 07/29/2019  Medication Sig  . acetaminophen (TYLENOL) 500 MG tablet Take 500 mg by mouth every 6 (six) hours as needed.  Marland Kitchen azelastine  (ASTELIN) 0.1 % nasal spray Place 2 sprays into both nostrils 2 (two) times daily. Use in each nostril as directed  . clindamycin-benzoyl peroxide (BENZACLIN) gel Apply topically 2 (two) times daily.  . cyanocobalamin (,VITAMIN B-12,) 1000 MCG/ML injection Inject 1 mL (1,000 mcg total) into the muscle every 30 (thirty) days.  . cyclobenzaprine (FLEXERIL) 5 MG tablet TAKE 1 TABLET BY MOUTH THREE TIMES A DAY AS NEEDED FOR MUSCLE SPASMS  . fluticasone (FLONASE) 50 MCG/ACT nasal spray Place 2 sprays into both nostrils daily.  . hydrOXYzine (ATARAX/VISTARIL) 10 MG tablet Take 1 tablet (10 mg total) by mouth 3 (three) times daily as needed.  Marland Kitchen ketorolac (TORADOL) 60 MG/2ML SOLN injection Inject 74ml (60mg ) intramuscularly at onset of migraine. May repeat in 6 hours. Max twice a day and 4 days per month.  . methylPREDNISolone (MEDROL DOSEPAK) 4 MG TBPK tablet Follow pack instructions. Take with food.  MIRENA, 52 MG, 20 MCG/24HR IUD   . ondansetron (ZOFRAN ODT) 4 MG disintegrating tablet Take 1 tablet (4 mg total) by mouth every 8 (eight) hours as needed for nausea or vomiting.  . Rimegepant Sulfate (NURTEC) 75 MG TBDP Take 75 mg by mouth once as needed for up to 1 dose. Try to take as soon as possible for migraine. Once daily.  . tizanidine (ZANAFLEX) 6 MG capsule Take 1 capsule (6 mg total) by mouth 3 (three) times daily as needed for muscle spasms.  Marland Kitchen topiramate (TOPAMAX) 100 MG tablet Take 1 tablet (100 mg total) by mouth 2 (two) times  daily.  . Ubrogepant (UBRELVY) 50 MG TABS Take 50-100 mg by mouth every 2 (two) hours as needed. Max 200mg  a day  . zolpidem (AMBIEN) 10 MG tablet Take 1 tablet (10 mg total) by mouth at bedtime as needed for sleep.     Past Surgical History:  Procedure Laterality Date  . CESAREAN SECTION    . FOOT SURGERY Bilateral   . WISDOM TOOTH EXTRACTION      Family History  Problem Relation Age of Onset  . Thyroid disease Mother   . Hyperlipidemia Mother   .  Arthritis/Rheumatoid Mother   . Diabetes Mother   . Hypertension Father   . Kidney disease Father   . Cirrhosis Father   . Diabetes Maternal Grandmother   . Stroke Maternal Grandmother   . Stroke Maternal Grandfather     New complaints: None today  Social history: Works at [employer]  Controlled substance contract: will have signed at next face to face visit    Review of Systems  Constitutional: Negative for diaphoresis and weight loss.  Eyes: Negative for blurred vision, double vision and pain.  Respiratory: Negative for shortness of breath.   Cardiovascular: Negative for chest pain, palpitations, orthopnea and leg swelling.  Gastrointestinal: Negative for abdominal pain.  Skin: Negative for rash.  Neurological: Negative for dizziness, sensory change, loss of consciousness, weakness and headaches.  Endo/Heme/Allergies: Negative for polydipsia. Does not bruise/bleed easily.  Psychiatric/Behavioral: Negative for memory loss. The patient does not have insomnia.   All other systems reviewed and are negative.    Observations/Objective: .Alert and oriented- answers all questions appropriately No distress    Assessment and Plan: Erica Rodriguez in today with chief complaint of Medical Management of Chronic Issues   1. Primary insomnia Bedtime routine Do nt eat 2 hours prior to bedtime Avoid your cell phone for at least an hour before going to bed - zolpidem (AMBIEN) 10 MG tablet; Take 1 tablet (10 mg total) by mouth at bedtime as needed for sleep.  Dispense: 30 tablet; Refill: 5  2. GAD (generalized anxiety disorder) Stress management reviewed   Follow Up Instructions: 6 months and prn    I discussed the assessment and treatment plan with the patient. The patient was provided an opportunity to ask questions and all were answered. The patient agreed with the plan and demonstrated an understanding of the instructions.   The patient was advised to call  back or seek an in-person evaluation if the symptoms worsen or if the condition fails to improve as anticipated.  The above assessment and management plan was discussed with the patient. The patient verbalized understanding of and has agreed to the management plan. Patient is aware to call the clinic if symptoms persist or worsen. Patient is aware when to return to the clinic for a follow-up visit. Patient educated on when it is appropriate to go to the emergency department.   Time call ended:  10:30  I provided 15 minutes of non-face-to-face time during this encounter.    [name] Hassell Done, FNP

## 2019-08-01 MED FILL — NURTEC 75 MG TBDP: 75 | 30 days supply | Qty: 8 | Fill #4

## 2019-08-22 MED FILL — MINOCYCLINE 100 MG CAPSULE: 100 | 30 days supply | Qty: 30 | Fill #1

## 2019-08-23 MED FILL — KETOROLAC TROMETHAMINE 60 M: 60 | 5 days supply | Qty: 10 | Fill #4

## 2019-08-26 MED FILL — TOPIRAMATE 100 MG TABLET: 100 | 90 days supply | Qty: 180 | Fill #2

## 2019-08-26 MED FILL — CYANOCOBALAMIN 1,000 MCG/ML: 1000 | 90 days supply | Qty: 3 | Fill #1

## 2019-08-26 MED FILL — ZOLPIDEM TARTRATE 10 MG TAB: 10 | 30 days supply | Qty: 30 | Fill #1

## 2019-08-31 ENCOUNTER — Telehealth: Payer: Self-pay

## 2019-08-31 NOTE — Telephone Encounter (Signed)
I received a message from billing that the patient had some questions regarding patient assistance. I called the patient but she did not answer so I left a VM asking her to call me back.

## 2019-08-31 NOTE — Telephone Encounter (Signed)
Patient returned missed call    Work# [phone] ext [phone]

## 2019-09-01 NOTE — Telephone Encounter (Signed)
Patient called back this morning and we discussed the reimbursement program.

## 2019-09-07 ENCOUNTER — Other Ambulatory Visit: Payer: Self-pay | Admitting: Neurology

## 2019-09-07 MED ORDER — METHYLPREDNISOLONE 4 MG PO TBPK: ORAL_TABLET | ORAL | 0 refills | Status: DC

## 2019-09-13 ENCOUNTER — Encounter: Payer: Self-pay | Admitting: Family Medicine

## 2019-09-13 ENCOUNTER — Other Ambulatory Visit: Payer: Self-pay

## 2019-09-13 ENCOUNTER — Ambulatory Visit: Payer: 59 | Admitting: Family Medicine

## 2019-09-13 VITALS — BP 122/85 | HR 100 | Temp 98.2°F | Resp 20 | Ht 61.0 in | Wt 145.5 lb

## 2019-09-13 DIAGNOSIS — M26629 Arthralgia of temporomandibular joint, unspecified side: Secondary | ICD-10-CM

## 2019-09-13 MED ORDER — PREDNISONE 10 MG PO TABS: ORAL_TABLET | ORAL | 0 refills | Status: DC

## 2019-09-13 NOTE — Progress Notes (Signed)
Subjective:  Patient ID: Erica Rodriguez, female    DOB: 1986/05/13  Age: 34 y.o. MRN: [id number]  CC: Jaw Pain (right side, history of TMJ; )   HPI Erica Rodriguez presents for 3 to 4 weeks of increasing pain on the right jaw.  It is very painful to chew.  It is getting worse over time.  She points to the area of the right cheekbone.  This is where it seems to hurt the worse.  She has begun avoiding foods that have to be chewed.  She has had problems with this in the past that have not lasted this long.  Generally few days and it is back to normal.  Depression screen [facility] 2/9 09/13/2019 05/26/2019 05/16/2019  Decreased Interest 0 0 0  Down, Depressed, Hopeless 0 0 0  PHQ - 2 Score 0 0 0    History Erica Rodriguez has a past medical history of Depression, Headache, and Mild preeclampsia.   She has a past surgical history that includes Cesarean section; Wisdom tooth extraction; and Foot surgery (Bilateral).   Her family history includes Arthritis/Rheumatoid in her mother; Cirrhosis in her father; Diabetes in her maternal grandmother and mother; Hyperlipidemia in her mother; Hypertension in her father; Kidney disease in her father; Stroke in her maternal grandfather and maternal grandmother; Thyroid disease in her mother.She reports that she has never smoked. She has never used smokeless tobacco. She reports that she does not drink alcohol or use drugs.    ROS Review of Systems  Constitutional: Positive for appetite change.  HENT: Negative.     Objective:  BP 122/85   Pulse 100   Temp 98.2 F (36.8 C) (Oral)   Resp 20   Ht 5\' 1"  (1.549 m)   Wt 145 lb 8 oz (66 kg)   SpO2 99%   BMI 27.49 kg/m   BP Readings from Last 3 Encounters:  09/13/19 122/85  05/26/19 (!) 142/90  05/16/19 125/85    Wt Readings from Last 3 Encounters:  09/13/19 145 lb 8 oz (66 kg)  05/26/19 148 lb (67.1 kg)  05/16/19 148 lb 6.4 oz (67.3 kg)     Physical Exam Constitutional:      General: She is not in acute  distress.    Appearance: Normal appearance. She is not ill-appearing.  HENT:     Head: Normocephalic and atraumatic.     Comments:   Tender for palpation over the styloid at the temporomandibular region.    Right Ear: Tympanic membrane normal.     Left Ear: Tympanic membrane normal.  Cardiovascular:     Rate and Rhythm: Normal rate and regular rhythm.  Neurological:     Mental Status: She is alert.       Assessment & Plan:   Kristyne was seen today for jaw pain.  Diagnoses and all orders for this visit:  TMJ syndrome  Other orders -     predniSONE (DELTASONE) 10 MG tablet; Take 5 daily for 3 days followed by 4,3,2 and 1 for 3 days each.       I am having Val Riles start on predniSONE. I am also having her maintain her fluticasone, acetaminophen, Mirena (52 MG), clindamycin-benzoyl peroxide, Nurtec, Ubrelvy, ondansetron, topiramate, cyclobenzaprine, ketorolac, tizanidine, cyanocobalamin, azelastine, hydrOXYzine, zolpidem, and methylPREDNISolone. We will continue to administer cyanocobalamin.  Allergies as of 09/13/2019   No Known Allergies     Medication List       Accurate as of September 13, 2019  5:19 PM. If you have any questions, ask your nurse or doctor.        acetaminophen 500 MG tablet Commonly known as: TYLENOL Take 500 mg by mouth every 6 (six) hours as needed.   azelastine 0.1 % nasal spray Commonly known as: ASTELIN Place 2 sprays into both nostrils 2 (two) times daily. Use in each nostril as directed   clindamycin-benzoyl peroxide gel Commonly known as: BENZACLIN Apply topically 2 (two) times daily.   cyanocobalamin 1000 MCG/ML injection Commonly known as: (VITAMIN B-12) Inject 1 mL (1,000 mcg total) into the muscle every 30 (thirty) days.   cyclobenzaprine 5 MG tablet Commonly known as: FLEXERIL TAKE 1 TABLET BY MOUTH THREE TIMES A DAY AS NEEDED FOR MUSCLE SPASMS   fluticasone 50 MCG/ACT nasal spray Commonly known as: FLONASE Place 2 sprays  into both nostrils daily.   hydrOXYzine 10 MG tablet Commonly known as: ATARAX/VISTARIL Take 1 tablet (10 mg total) by mouth 3 (three) times daily as needed.   ketorolac 60 MG/2ML Soln injection Commonly known as: TORADOL Inject 47ml (60mg ) intramuscularly at onset of migraine. May repeat in 6 hours. Max twice a day and 4 days per month.   methylPREDNISolone 4 MG Tbpk tablet Commonly known as: MEDROL DOSEPAK Follow pack instructions. Take with food.   Mirena (52 MG) 20 MCG/24HR IUD Generic drug: levonorgestrel   Nurtec 75 MG Tbdp Generic drug: Rimegepant Sulfate Take 75 mg by mouth once as needed for up to 1 dose. Try to take as soon as possible for migraine. Once daily.   ondansetron 4 MG disintegrating tablet Commonly known as: Zofran ODT Take 1 tablet (4 mg total) by mouth every 8 (eight) hours as needed for nausea or vomiting.   predniSONE 10 MG tablet Commonly known as: DELTASONE Take 5 daily for 3 days followed by 4,3,2 and 1 for 3 days each. Started by: , MD   tizanidine 6 MG capsule Commonly known as: ZANAFLEX Take 1 capsule (6 mg total) by mouth 3 (three) times daily as needed for muscle spasms.   topiramate 100 MG tablet Commonly known as: TOPAMAX Take 1 tablet (100 mg total) by mouth 2 (two) times daily.   Ubrelvy 50 MG Tabs Generic drug: Ubrogepant Take 50-100 mg by mouth every 2 (two) hours as needed. Max 200mg  a day   zolpidem 10 MG tablet Commonly known as: Ambien Take 1 tablet (10 mg total) by mouth at bedtime as needed for sleep.        Follow-up: Return if symptoms worsen or fail to improve.  Mechele Claude, M.D.

## 2019-09-15 DIAGNOSIS — Z0289 Encounter for other administrative examinations: Secondary | ICD-10-CM

## 2019-09-16 ENCOUNTER — Other Ambulatory Visit: Payer: Self-pay | Admitting: Neurology

## 2019-09-16 DIAGNOSIS — G43711 Chronic migraine without aura, intractable, with status migrainosus: Secondary | ICD-10-CM

## 2019-09-16 MED FILL — CYCLOBENZAPRINE HCL 5 MG TA: 5 | 20 days supply | Qty: 60 | Fill #2

## 2019-09-16 MED FILL — NURTEC 75 MG TBDP: 75 | 30 days supply | Qty: 8 | Fill #5

## 2019-09-19 ENCOUNTER — Encounter: Payer: Self-pay | Admitting: *Deleted

## 2019-09-19 ENCOUNTER — Telehealth: Payer: Self-pay | Admitting: *Deleted

## 2019-09-19 MED FILL — KETOROLAC TROMETHAMINE 60 M: 60 | 5 days supply | Qty: 10 | Fill #0

## 2019-09-19 NOTE — Telephone Encounter (Signed)
FMLA forms completed, signed, and sent to medical records for processing.  

## 2019-09-20 ENCOUNTER — Telehealth: Payer: Self-pay | Admitting: *Deleted

## 2019-09-20 NOTE — Telephone Encounter (Signed)
Pt matrix form faxed on 09/19/19

## 2019-09-23 ENCOUNTER — Telehealth: Payer: 59 | Admitting: Physician Assistant

## 2019-09-23 DIAGNOSIS — J329 Chronic sinusitis, unspecified: Secondary | ICD-10-CM | POA: Diagnosis not present

## 2019-09-23 DIAGNOSIS — B9789 Other viral agents as the cause of diseases classified elsewhere: Secondary | ICD-10-CM | POA: Diagnosis not present

## 2019-09-23 MED ORDER — PREDNISONE 20 MG PO TABS: ORAL_TABLET | ORAL | 0 refills | Status: DC

## 2019-09-23 MED ORDER — AZELASTINE HCL 0.1 % NA SOLN: 2.0000 | Freq: Two times a day (BID) | NASAL | 0 refills | Status: DC

## 2019-09-23 NOTE — Addendum Note (Signed)
Addended by: Sebastian Ache on: 09/23/2019 02:00 PM   Modules accepted: Orders

## 2019-09-23 NOTE — Progress Notes (Signed)

## 2019-09-29 MED FILL — ZOLPIDEM TARTRATE 10 MG TAB: 10 | 30 days supply | Qty: 30 | Fill #2

## 2019-10-05 ENCOUNTER — Telehealth: Payer: Self-pay | Admitting: *Deleted

## 2019-10-05 NOTE — Telephone Encounter (Signed)
I called patients insurance, [employer], and spoke to [place].  She states that [id number] and [id number] are billable and do not require PA.  Ref# for call is [id number].

## 2019-10-11 ENCOUNTER — Encounter: Payer: Self-pay | Admitting: Nurse Practitioner

## 2019-10-11 NOTE — Progress Notes (Signed)
Consent Form Botulism Toxin Injection For Chronic Migraine  10/12/2019: Baseline is daily headaches, 10-15 migraine days a month. +a. Dry needling for myofascial cervical pain syndrome - she declined. She improved to 50% migraine frequency. Nurtec helps more than Ubrelvy. She took all three of the CGRPs.    Reviewed orally with patient, additionally signature is on file:  Botulism toxin has been approved by the Federal drug administration for treatment of chronic migraine. Botulism toxin does not cure chronic migraine and it may not be effective in some patients.  The administration of botulism toxin is accomplished by injecting a small amount of toxin into the muscles of the neck and head. Dosage must be titrated for each individual. Any benefits resulting from botulism toxin tend to wear off after 3 months with a repeat injection required if benefit is to be maintained. Injections are usually done every 3-4 months with maximum effect peak achieved by about 2 or 3 weeks. Botulism toxin is expensive and you should be sure of what costs you will incur resulting from the injection.  The side effects of botulism toxin use for chronic migraine may include:   -Transient, and usually mild, facial weakness with facial injections  -Transient, and usually mild, head or neck weakness with head/neck injections  -Reduction or loss of forehead facial animation due to forehead muscle weakness  -Eyelid drooping  -Dry eye  -Pain at the site of injection or bruising at the site of injection  -Double vision  -Potential unknown long term risks  Contraindications: You should not have Botox if you are pregnant, nursing, allergic to albumin, have an infection, skin condition, or muscle weakness at the site of the injection, or have myasthenia gravis, Lambert-Eaton syndrome, or ALS.  It is also possible that as with any injection, there may be an allergic reaction or no effect from the medication. Reduced  effectiveness after repeated injections is sometimes seen and rarely infection at the injection site may occur. All care will be taken to prevent these side effects. If therapy is given over a long time, atrophy and wasting in the muscle injected may occur. Occasionally the patient's become refractory to treatment because they develop antibodies to the toxin. In this event, therapy needs to be modified.  I have read the above information and consent to the administration of botulism toxin.    BOTOX PROCEDURE NOTE FOR MIGRAINE HEADACHE    Contraindications and precautions discussed with patient(above). Aseptic procedure was observed and patient tolerated procedure. Procedure performed by Dr. Artemio Aly  The condition has existed for more than 6 months, and pt does not have a diagnosis of ALS, Myasthenia Gravis or Lambert-Eaton Syndrome.  Risks and benefits of injections discussed and pt agrees to proceed with the procedure.  Written consent obtained  These injections are medically necessary. Pt  receives good benefits from these injections. These injections do not cause sedations or hallucinations which the oral therapies may cause.  Description of procedure:  The patient was placed in a sitting position. The standard protocol was used for Botox as follows, with 5 units of Botox injected at each site:   -Procerus muscle, midline injection  -Corrugator muscle, bilateral injection  -Frontalis muscle, bilateral injection, with 2 sites each side, medial injection was performed in the upper one third of the frontalis muscle, in the region vertical from the medial inferior edge of the superior orbital rim. The lateral injection was again in the upper one third of the forehead vertically above  the lateral limbus of the cornea, 1.5 cm lateral to the medial injection site.  - Levator Scapulae: 5 units bilaterally  -Temporalis muscle injection, 5 sites, bilaterally. The first injection was 3 cm  above the tragus of the ear, second injection site was 1.5 cm to 3 cm up from the first injection site in line with the tragus of the ear. The third injection site was 1.5-3 cm forward between the first 2 injection sites. The fourth injection site was 1.5 cm posterior to the second injection site. 5th site laterally in the temporalis  muscleat the level of the outer canthus.  - Patient feels her clenching is a trigger for headaches. +5 units masseter bilaterally   - Patient feels the migraines are centered around the eyes +5 units bilaterally at the outer canthus in the orbicularis occuli  -Occipitalis muscle injection, 3 sites, bilaterally. The first injection was done one half way between the occipital protuberance and the tip of the mastoid process behind the ear. The second injection site was done lateral and superior to the first, 1 fingerbreadth from the first injection. The third injection site was 1 fingerbreadth superiorly and medially from the first injection site.  -Cervical paraspinal muscle injection, 2 sites, bilateral knee first injection site was 1 cm from the midline of the cervical spine, 3 cm inferior to the lower border of the occipital protuberance. The second injection site was 1.5 cm superiorly and laterally to the first injection site.  -Trapezius muscle injection was performed at 3 sites, bilaterally. The first injection site was in the upper trapezius muscle halfway between the inflection point of the neck, and the acromion. The second injection site was one half way between the acromion and the first injection site. The third injection was done between the first injection site and the inflection point of the neck.   Will return for repeat injection in 3 months.   A 200 unit sof Botox was used, any Botox not injected was wasted. The patient tolerated the procedure well, there were no complications of the above procedure.

## 2019-10-12 ENCOUNTER — Other Ambulatory Visit: Payer: Self-pay

## 2019-10-12 ENCOUNTER — Other Ambulatory Visit: Payer: Self-pay | Admitting: Family Medicine

## 2019-10-12 ENCOUNTER — Ambulatory Visit (INDEPENDENT_AMBULATORY_CARE_PROVIDER_SITE_OTHER): Payer: 59 | Admitting: Neurology

## 2019-10-12 VITALS — Temp 98.8°F

## 2019-10-12 DIAGNOSIS — G43711 Chronic migraine without aura, intractable, with status migrainosus: Secondary | ICD-10-CM

## 2019-10-12 DIAGNOSIS — L700 Acne vulgaris: Secondary | ICD-10-CM

## 2019-10-12 DIAGNOSIS — R110 Nausea: Secondary | ICD-10-CM

## 2019-10-12 DIAGNOSIS — G43909 Migraine, unspecified, not intractable, without status migrainosus: Secondary | ICD-10-CM

## 2019-10-12 MED ORDER — MINOCYCLINE HCL 100 MG PO CAPS: 100.0000 mg | ORAL_CAPSULE | Freq: Every day | ORAL | 0 refills | Status: DC

## 2019-10-12 MED ORDER — METHYLPREDNISOLONE 4 MG PO TBPK: ORAL_TABLET | ORAL | 1 refills | Status: DC

## 2019-10-12 MED ORDER — ONDANSETRON 4 MG PO TBDP: 4.0000 mg | ORAL_TABLET | Freq: Three times a day (TID) | ORAL | 11 refills | Status: DC | PRN

## 2019-10-12 MED ORDER — KETOROLAC TROMETHAMINE 60 MG/2ML IM SOLN
60.0000 mg | Freq: Once | INTRAMUSCULAR | Status: AC
  Administered 2019-10-12: 60 mg via INTRAMUSCULAR

## 2019-10-12 MED ORDER — PROPRANOLOL HCL ER 60 MG PO CP24: 60.0000 mg | ORAL_CAPSULE | Freq: Every day | ORAL | 3 refills | Status: DC

## 2019-10-12 MED FILL — ONDANSETRON ODT 4 MG TABLET: 4 | 5 days supply | Qty: 30 | Fill #0

## 2019-10-12 MED FILL — METHYLPREDNISOLONE 4 MG TAB: 4 | 6 days supply | Qty: 21 | Fill #0

## 2019-10-12 MED FILL — PROPRANOLOL HCL ER 60 MG CP: 60 | 30 days supply | Qty: 30 | Fill #0

## 2019-10-12 MED FILL — MINOCYCLINE 100 MG CAPSULE: 100 | 90 days supply | Qty: 90 | Fill #0

## 2019-10-12 NOTE — Addendum Note (Signed)
Addended by: Bertram Savin on: 10/12/2019 04:21 PM   Modules accepted: Orders

## 2019-10-12 NOTE — Progress Notes (Addendum)
Botox- 200 units x 1 vial Lot: [id number] Expiration: 06/2022 NDC: 1191-4782-95  Bacteriostatic 0.9% Sodium Chloride- 61mL total Lot: [id number] Expiration: 10/06/2019 NDC: 6578-4696-29  Dx: B28.413 B/B  Administered Toradol 60 mg IM injection in pt's RUOQ of R buttock per vo Dr. Lucia Gaskins. Pt tolerated well. No bleeding. See MAR.

## 2019-10-12 NOTE — Addendum Note (Signed)
Addended by: Naomie Dean B on: 10/12/2019 04:08 PM   Modules accepted: Orders

## 2019-10-17 MED FILL — NURTEC 75 MG TBDP: 75 | 30 days supply | Qty: 8 | Fill #6

## 2019-10-21 ENCOUNTER — Telehealth: Payer: 59 | Admitting: Nurse Practitioner

## 2019-10-21 DIAGNOSIS — L02439 Carbuncle of limb, unspecified: Secondary | ICD-10-CM | POA: Diagnosis not present

## 2019-10-21 MED ORDER — SULFAMETHOXAZOLE-TRIMETHOPRIM 800-160 MG PO TABS: 1.0000 | ORAL_TABLET | Freq: Two times a day (BID) | ORAL | 0 refills | Status: DC

## 2019-10-21 NOTE — Progress Notes (Signed)
E Visit for Cellulitis  We are sorry that you are not feeling well. Here is how we plan to help!  Based on what you shared with me it looks like you have carbuncle.  Carbuncle looks like areas of skin redness, swelling, and warmth; it develops as a result of bacteria entering under the skin. Little red spots and/or bleeding can be seen in skin, and tiny surface sacs containing fluid can occur. Fever can be present. Cellulitis is almost always on one side of a body, and the lower limbs are the most common site of involvement.   I have prescribed:  Bactrim DS 1 tablet by mouth twice a day for 7 days  HOME CARE:  . Take your medications as ordered and take all of them, even if the skin irritation appears to be healing.   GET HELP RIGHT AWAY IF:  . Symptoms that don't begin to go away within 48 hours. . Severe redness persists or worsens . If the area turns color, spreads or swells. . If it blisters and opens, develops yellow-brown crust or bleeds. . You develop a fever or chills. . If the pain increases or becomes unbearable.  . Are unable to keep fluids and food down.  MAKE SURE YOU    Understand these instructions.  Will watch your condition.  Will get help right away if you are not doing well or get worse.  Thank you for choosing an e-visit. Your e-visit answers were reviewed by a board certified advanced clinical practitioner to complete your personal care plan. Depending upon the condition, your plan could have included both over the counter or prescription medications. Please review your pharmacy choice. Make sure the pharmacy is open so you can pick up prescription now. If there is a problem, you may contact your provider through [employer] and have the prescription routed to another pharmacy. Your safety is important to [place]. If you have drug allergies check your prescription carefully.  For the next 24 hours you can use MyChart to ask questions about today's visit,  request a non-urgent call back, or ask for a work or school excuse. You will get an email in the next two days asking about your experience. I hope that your e-visit has been valuable and will speed your recovery.  5-10 minutes spent reviewing and documenting in chart.

## 2019-10-27 MED FILL — ZOLPIDEM TARTRATE 10 MG TAB: 10 | 30 days supply | Qty: 30 | Fill #3

## 2019-11-08 ENCOUNTER — Telehealth: Payer: 59 | Admitting: Emergency Medicine

## 2019-11-08 DIAGNOSIS — N760 Acute vaginitis: Secondary | ICD-10-CM

## 2019-11-08 MED ORDER — FLUCONAZOLE 150 MG PO TABS: 150.0000 mg | ORAL_TABLET | Freq: Once | ORAL | 0 refills | Status: AC

## 2019-11-08 NOTE — Progress Notes (Signed)
We are sorry that you are not feeling well. Here is how we plan to help! Based on what you shared with me it looks like you: yeast vaginitis  Vaginosis is an inflammation of the vagina that can result in discharge, itching and pain. The cause is usually a change in the normal balance of vaginal bacteria or an infection. Vaginosis can also result from reduced estrogen levels after menopause.  The most common causes of vaginosis are:   Bacterial vaginosis which results from an overgrowth of one on several organisms that are normally present in your vagina.   Yeast infections which are caused by a naturally occurring fungus called candida.   Vaginal atrophy (atrophic vaginosis) which results from the thinning of the vagina from reduced estrogen levels after menopause.   Trichomoniasis which is caused by a parasite and is commonly transmitted by sexual intercourse.  Factors that increase your risk of developing vaginosis include: Marland Kitchen Medications, such as antibiotics and steroids . Uncontrolled diabetes . Use of hygiene products such as bubble bath, vaginal spray or vaginal deodorant . Douching . Wearing damp or tight-fitting clothing . Using an intrauterine device (IUD) for birth control . Hormonal changes, such as those associated with pregnancy, birth control pills or menopause . Sexual activity . Having a sexually transmitted infection  Your treatment plan is Fluconazole 150 mg tablet by mouth once  Be sure to take all of the medication as directed. Stop taking any medication if you develop a rash, tongue swelling or shortness of breath. Mothers who are breast feeding should consider pumping and discarding their breast milk while on these antibiotics. However, there is no consensus that infant exposure at these doses would be harmful.  Remember that medication creams can weaken latex condoms. Marland Kitchen   HOME CARE:  Good hygiene may prevent some types of vaginosis from recurring and may  relieve some symptoms:  . Avoid baths, hot tubs and whirlpool spas. Rinse soap from your outer genital area after a shower, and dry the area well to prevent irritation. Don't use scented or harsh soaps, such as those with deodorant or antibacterial action. Marland Kitchen Avoid irritants. These include scented tampons and pads. . Wipe from front to back after using the toilet. Doing so avoids spreading fecal bacteria to your vagina.  Other things that may help prevent vaginosis include:  Marland Kitchen Don't douche. Your vagina doesn't require cleansing other than normal bathing. Repetitive douching disrupts the normal organisms that reside in the vagina and can actually increase your risk of vaginal infection. Douching won't clear up a vaginal infection. . Use a latex condom. Both female and female latex condoms may help you avoid infections spread by sexual contact. . Wear cotton underwear. Also wear pantyhose with a cotton crotch. If you feel comfortable without it, skip wearing underwear to bed. Yeast thrives in [employer] Your symptoms should improve in the next day or two.  GET HELP RIGHT AWAY IF:  . You have pain in your lower abdomen ( pelvic area or over your ovaries) . You develop nausea or vomiting . You develop a fever . Your discharge changes or worsens . You have persistent pain with intercourse . You develop shortness of breath, a rapid pulse, or you faint.  These symptoms could be signs of problems or infections that need to be evaluated by a medical provider now.  MAKE SURE YOU    Understand these instructions.  Will watch your condition.  Will get help right away if you  are not doing well or get worse.  Your e-visit answers were reviewed by a board certified advanced clinical practitioner to complete your personal care plan. Depending upon the condition, your plan could have included both over the counter or prescription medications. Please review your pharmacy choice to make sure that  you have choses a pharmacy that is open for you to pick up any needed prescription, Your safety is important to [place]. If you have drug allergies check your prescription carefully.   You can use MyChart to ask questions about today's visit, request a non-urgent call back, or ask for a work or school excuse for 24 hours related to this e-Visit. If it has been greater than 24 hours you will need to follow up with your provider, or enter a new e-Visit to address those concerns. You will get a MyChart message within the next two days asking about your experience. I hope that your e-visit has been valuable and will speed your recovery.  Greater than 5 but less than 10 minutes spent researching, coordinating, and implementing care for this patient today

## 2019-11-11 MED FILL — PROPRANOLOL HCL ER 60 MG CP: 60 | 30 days supply | Qty: 30 | Fill #1

## 2019-11-11 MED FILL — NURTEC 75 MG TBDP: 75 | 30 days supply | Qty: 8 | Fill #7

## 2019-11-22 ENCOUNTER — Encounter: Payer: Self-pay | Admitting: Nurse Practitioner

## 2019-11-22 ENCOUNTER — Other Ambulatory Visit: Payer: Self-pay | Admitting: Nurse Practitioner

## 2019-11-22 DIAGNOSIS — E538 Deficiency of other specified B group vitamins: Secondary | ICD-10-CM

## 2019-11-22 MED ORDER — CYANOCOBALAMIN 1000 MCG/ML IJ SOLN: 1000.0000 ug | INTRAMUSCULAR | 1 refills | Status: DC

## 2019-11-22 MED FILL — CYANOCOBALAMIN 1,000 MCG/ML: 1000 | 90 days supply | Qty: 3 | Fill #0

## 2019-11-25 MED FILL — ZOLPIDEM TARTRATE 10 MG TAB: 10 | 30 days supply | Qty: 30 | Fill #4

## 2019-11-25 MED FILL — KETOROLAC TROMETHAMINE 60 M: 60 | 5 days supply | Qty: 10 | Fill #1

## 2019-11-25 MED FILL — TOPIRAMATE 100 MG TABLET: 100 | 90 days supply | Qty: 180 | Fill #3

## 2019-12-09 MED FILL — METHYLPREDNISOLONE 4 MG TAB: 4 | 6 days supply | Qty: 21 | Fill #1

## 2019-12-09 MED FILL — NURTEC 75 MG TBDP: 75 | 30 days supply | Qty: 8 | Fill #8

## 2019-12-12 ENCOUNTER — Telehealth: Payer: Self-pay | Admitting: Neurology

## 2019-12-12 NOTE — Telephone Encounter (Signed)
I spoke with the pt and we discussed the medication cocktail from Dr. Terrace Arabia noted below for persistant migraine headache. Pt verbalized appreciation. She has been taking Flexeril 5 mg TID for a few weeks and will continue. However she will call if need to try Tizanidine. Pt also aware I will send a mychart message with the recommendations or reference later. She verbalized appreciation. She will call back if needed.

## 2019-12-12 NOTE — Telephone Encounter (Signed)
Please call patient, she may try following cocktail for her severe prolonged migraine headaches  Ubrelvy   -Aleve 2 tablets -Zofran 4 mg -Flexeril-if she has a lot of neck pain, tension, feel like Flexeril provides suboptimal control, may consider tizanidine, -Heating pad -Even consider physical therapy

## 2019-12-28 MED FILL — ZOLPIDEM TARTRATE 10 MG TAB: 10 | 30 days supply | Qty: 30 | Fill #5

## 2019-12-30 MED FILL — KETOROLAC TROMETHAMINE 60 M: 60 | 5 days supply | Qty: 10 | Fill #2

## 2020-01-03 ENCOUNTER — Encounter: Payer: 59 | Admitting: Nurse Practitioner

## 2020-01-06 ENCOUNTER — Other Ambulatory Visit: Payer: Self-pay

## 2020-01-06 ENCOUNTER — Encounter: Payer: Self-pay | Admitting: Nurse Practitioner

## 2020-01-06 ENCOUNTER — Ambulatory Visit (INDEPENDENT_AMBULATORY_CARE_PROVIDER_SITE_OTHER): Payer: 59 | Admitting: Nurse Practitioner

## 2020-01-06 VITALS — BP 112/80 | HR 79 | Temp 98.3°F | Resp 20 | Ht 61.0 in | Wt 145.0 lb

## 2020-01-06 DIAGNOSIS — Z0000 Encounter for general adult medical examination without abnormal findings: Secondary | ICD-10-CM | POA: Diagnosis not present

## 2020-01-06 DIAGNOSIS — F411 Generalized anxiety disorder: Secondary | ICD-10-CM | POA: Diagnosis not present

## 2020-01-06 DIAGNOSIS — Z0001 Encounter for general adult medical examination with abnormal findings: Secondary | ICD-10-CM | POA: Diagnosis not present

## 2020-01-06 DIAGNOSIS — G43711 Chronic migraine without aura, intractable, with status migrainosus: Secondary | ICD-10-CM

## 2020-01-06 DIAGNOSIS — F5101 Primary insomnia: Secondary | ICD-10-CM | POA: Diagnosis not present

## 2020-01-06 NOTE — Progress Notes (Signed)
Subjective:    Patient ID: Erica Rodriguez, female    DOB: December 11, 1985, 34 y.o.   MRN: [id number]   Chief Complaint: Annual Exam (Feet hurting all the time)    HPI:  1. Chronic migraine without aura, with intractable migraine, so stated, with status migrainosus Has had migraines frequently. Maintains neurology appointments, medication regimen, and does botox via neurology.   2. Primary insomnia Does not sleep through the night and has a hard time going to sleep. Does take ambien prn, does not help any more.   3. GAD (generalized anxiety disorder) Reports anxiety being more intense lately, possibly related to problems with her mother. Is being treated with medication from neurologist.     Outpatient Encounter Medications as of 01/06/2020  Medication Sig  . azelastine (ASTELIN) 0.1 % nasal spray Place 2 sprays into both nostrils 2 (two) times daily. Use in each nostril as directed  . clindamycin-benzoyl peroxide (BENZACLIN) gel Apply topically 2 (two) times daily.  . cyanocobalamin (,VITAMIN B-12,) 1000 MCG/ML injection Inject 1 mL (1,000 mcg total) into the muscle every 30 (thirty) days.  . cyclobenzaprine (FLEXERIL) 5 MG tablet TAKE 1 TABLET BY MOUTH THREE TIMES A DAY AS NEEDED FOR MUSCLE SPASMS  . fluticasone (FLONASE) 50 MCG/ACT nasal spray Place 2 sprays into both nostrils daily.  Marland Kitchen ketorolac (TORADOL) 60 MG/2ML SOLN injection INJECT (60MG ) INTRAMUSCULARLY AT ONSET OF MIGRAINE. MAY REPEAT IN 6 HOURS. MAX TWICE A DAY AND 4 DAYS PER MONTH.  . methylPREDNISolone (MEDROL DOSEPAK) 4 MG TBPK tablet Take all pills in the morning with food for 6 days.  . minocycline (MINOCIN) 100 MG capsule Take 1 capsule (100 mg total) by mouth daily.  MIRENA, 52 MG, 20 MCG/24HR IUD   . ondansetron (ZOFRAN ODT) 4 MG disintegrating tablet Take 1-2 tablets (4-8 mg total) by mouth every 8 (eight) hours as needed for nausea or vomiting.  . propranolol ER (INDERAL LA) 60 MG 24 hr capsule Take 1 capsule (60  mg total) by mouth daily.  . Rimegepant Sulfate (NURTEC) 75 MG TBDP Take 75 mg by mouth once as needed for up to 1 dose. Try to take as soon as possible for migraine. Once daily.  Marland Kitchen topiramate (TOPAMAX) 100 MG tablet Take 1 tablet (100 mg total) by mouth 2 (two) times daily.  Marland Kitchen Ubrogepant (UBRELVY) 50 MG TABS Take 50-100 mg by mouth every 2 (two) hours as needed. Max 200mg  a day  . zolpidem (AMBIEN) 10 MG tablet Take 1 tablet (10 mg total) by mouth at bedtime as needed for sleep.  . [DISCONTINUED] azelastine (ASTELIN) 0.1 % nasal spray Place 2 sprays into both nostrils 2 (two) times daily. Use in each nostril as directed  . [DISCONTINUED] predniSONE (DELTASONE) 10 MG tablet Take 5 daily for 3 days followed by 4,3,2 and 1 for 3 days each.  . [DISCONTINUED] sulfamethoxazole-trimethoprim (BACTRIM DS) 800-160 MG tablet Take 1 tablet by mouth 2 (two) times daily.            Past Surgical History:  Procedure Laterality Date  . CESAREAN SECTION    . FOOT SURGERY Bilateral   . WISDOM TOOTH EXTRACTION      Family History  Problem Relation Age of Onset  . Thyroid disease Mother   . Hyperlipidemia Mother   . Arthritis/Rheumatoid Mother   . Diabetes Mother   . Hypertension Father   . Kidney disease Father   . Cirrhosis Father   . Diabetes Maternal Grandmother   .  Stroke Maternal Grandmother   . Stroke Maternal Grandfather     New complaints: The bottom of both heels are bothering the patient. Reports a sharp pain that feels like pressure. Utilizes Voltaren gel daily, does help some.   Social history: Lives at home with her husband and son. Works 5 days per week, 8 hours per day.   Controlled substance contract: n/a     Review of Systems  Constitutional: Negative.   HENT: Negative.   Eyes: Negative.   Respiratory: Negative.   Cardiovascular: Negative.   Gastrointestinal: Negative.   Endocrine: Negative.   Genitourinary: Negative.   Musculoskeletal: Negative.   Skin:  Negative.   Allergic/Immunologic: Negative.   Neurological: Negative.   Hematological: Negative.   Psychiatric/Behavioral: Negative.        Objective:   Physical Exam Vitals reviewed.  Constitutional:      Appearance: Normal appearance. She is normal weight.  HENT:     Head: Normocephalic and atraumatic.     Right Ear: Tympanic membrane, ear canal and external ear normal.     Left Ear: Tympanic membrane, ear canal and external ear normal.     Nose: Nose normal.     Mouth/Throat:     Mouth: Mucous membranes are moist.     Pharynx: Oropharynx is clear.  Eyes:     Extraocular Movements: Extraocular movements intact.     Conjunctiva/sclera: Conjunctivae normal.     Pupils: Pupils are equal, round, and reactive to light.  Cardiovascular:     Rate and Rhythm: Normal rate and regular rhythm.     Pulses: Normal pulses.     Heart sounds: Normal heart sounds.  Pulmonary:     Effort: Pulmonary effort is normal.     Breath sounds: Normal breath sounds.  Abdominal:     General: Abdomen is flat. Bowel sounds are normal.     Palpations: Abdomen is soft.  Genitourinary:    General: Normal vulva.     Rectum: Normal.  Musculoskeletal:        General: Normal range of motion.     Cervical back: Normal range of motion and neck supple.  Skin:    General: Skin is warm and dry.     Capillary Refill: Capillary refill takes less than 2 seconds.  Neurological:     General: No focal deficit present.     Mental Status: She is alert and oriented to person, place, and time. Mental status is at baseline.  Psychiatric:        Mood and Affect: Mood normal.        Behavior: Behavior normal.        Thought Content: Thought content normal.        Judgment: Judgment normal.     BP 112/80   Pulse 79   Temp 98.3 F (36.8 C) (Temporal)   Resp 20   Ht 5\' 1"  (1.549 m)   Wt 145 lb (65.8 kg)   SpO2 100%   BMI 27.40 kg/m        Assessment & Plan:  Erica Rodriguez comes in today with chief  complaint of Annual Exam (Feet hurting all the time)   Diagnosis and orders addressed:  1. Chronic migraine without aura, with intractable migraine, so stated, with status migrainosus Maintain treatment regimen and comply with neurology appointments to the best of her ability.   2. Primary insomnia Continue [name]- has tried belsomra which did not help Add melatonin Bedtime routine  3. GAD (generalized  anxiety disorder) Encouraged to journal anxiety experiences and report them. Continue neurology treatment regimen.    Labs pending Health Maintenance reviewed Diet and exercise encouraged  Follow up plan: 6 months  Jefm Bryant, BSN, RN  [name], FNP

## 2020-01-06 NOTE — Patient Instructions (Signed)
Exercising to Stay Healthy To become healthy and stay healthy, it is recommended that you do moderate-intensity and vigorous-intensity exercise. You can tell that you are exercising at a moderate intensity if your heart starts beating faster and you start breathing faster but can still hold a conversation. You can tell that you are exercising at a vigorous intensity if you are breathing much harder and faster and cannot hold a conversation while exercising. Exercising regularly is important. It has many health benefits, such as:  Improving overall fitness, flexibility, and endurance.  Increasing bone density.  Helping with weight control.  Decreasing body fat.  Increasing muscle strength.  Reducing stress and tension.  Improving overall health. How often should I exercise? Choose an activity that you enjoy, and set realistic goals. Your health care provider can help you make an activity plan that works for you. Exercise regularly as told by your health care provider. This may include:  Doing strength training two times a week, such as: ? Lifting weights. ? Using resistance bands. ? Push-ups. ? Sit-ups. ? Yoga.  Doing a certain intensity of exercise for a given amount of time. Choose from these options: ? A total of 150 minutes of moderate-intensity exercise every week. ? A total of 75 minutes of vigorous-intensity exercise every week. ? A mix of moderate-intensity and vigorous-intensity exercise every week. Children, pregnant women, people who have not exercised regularly, people who are overweight, and older adults may need to talk with a health care provider about what activities are safe to do. If you have a medical condition, be sure to talk with your health care provider before you start a new exercise program. What are some exercise ideas? Moderate-intensity exercise ideas include:  Walking 1 mile (1.6 km) in about 15  minutes.  Biking.  Hiking.  Golfing.  Dancing.  Water aerobics. Vigorous-intensity exercise ideas include:  Walking 4.5 miles (7.2 km) or more in about 1 hour.  Jogging or running 5 miles (8 km) in about 1 hour.  Biking 10 miles (16.1 km) or more in about 1 hour.  Lap swimming.  Roller-skating or in-line skating.  Cross-country skiing.  Vigorous competitive sports, such as football, basketball, and soccer.  Jumping rope.  Aerobic dancing. What are some everyday activities that can help me to get exercise?  Yard work, such as: ? Pushing a lawn mower. ? Raking and bagging leaves.  Washing your car.  Pushing a stroller.  Shoveling snow.  Gardening.  Washing windows or floors. How can I be more active in my day-to-day activities?  Use stairs instead of an elevator.  Take a walk during your lunch break.  If you drive, park your car farther away from your work or school.  If you take public transportation, get off one stop early and walk the rest of the way.  Stand up or walk around during all of your indoor phone calls.  Get up, stretch, and walk around every 30 minutes throughout the day.  Enjoy exercise with a friend. Support to continue exercising will help you keep a regular routine of activity. What guidelines can I follow while exercising?  Before you start a new exercise program, talk with your health care provider.  Do not exercise so much that you hurt yourself, feel dizzy, or get very short of breath.  Wear comfortable clothes and wear shoes with good support.  Drink plenty of water while you exercise to prevent dehydration or heat stroke.  Work out until your breathing   and your heartbeat get faster. Where to find more information  [place] Department of Health and Human Services: www.hhs.gov  Centers for Disease Control and Prevention (CDC): www.cdc.gov Summary  Exercising regularly is important. It will improve your overall fitness,  flexibility, and endurance.  Regular exercise also will improve your overall health. It can help you control your weight, reduce stress, and improve your bone density.  Do not exercise so much that you hurt yourself, feel dizzy, or get very short of breath.  Before you start a new exercise program, talk with your health care provider. This information is not intended to replace advice given to you by your health care provider. Make sure you discuss any questions you have with your health care provider. Document Revised: 06/05/2017 Document Reviewed: 05/14/2017 Elsevier Patient Education  2020 [employer]  

## 2020-01-07 LAB — THYROID PANEL WITH TSH
Free Thyroxine Index: 1.8 (ref 1.2–4.9)
T3 Uptake Ratio: 26 % (ref 24–39)
T4, Total: 6.8 ug/dL (ref 4.5–12.0)
TSH: 1.47 u[IU]/mL (ref 0.450–4.500)

## 2020-01-07 LAB — CBC WITH DIFFERENTIAL/PLATELET
Basophils Absolute: 0 10*3/uL (ref 0.0–0.2)
Basos: 0 %
EOS (ABSOLUTE): 0.1 10*3/uL (ref 0.0–0.4)
Eos: 2 %
Hematocrit: 39.8 % (ref 34.0–46.6)
Hemoglobin: 13.3 g/dL (ref 11.1–15.9)
Immature Grans (Abs): 0 10*3/uL (ref 0.0–0.1)
Immature Granulocytes: 0 %
Lymphocytes Absolute: 2.4 10*3/uL (ref 0.7–3.1)
Lymphs: 39 %
MCH: 32.8 pg (ref 26.6–33.0)
MCHC: 33.4 g/dL (ref 31.5–35.7)
MCV: 98 fL — ABNORMAL HIGH (ref 79–97)
Monocytes Absolute: 0.5 10*3/uL (ref 0.1–0.9)
Monocytes: 8 %
Neutrophils Absolute: 3.1 10*3/uL (ref 1.4–7.0)
Neutrophils: 51 %
Platelets: 418 10*3/uL (ref 150–450)
RBC: 4.05 x10E6/uL (ref 3.77–5.28)
RDW: 12 % (ref 11.7–15.4)
WBC: 6 10*3/uL (ref 3.4–10.8)

## 2020-01-07 LAB — CMP14+EGFR
ALT: 10 IU/L (ref 0–32)
AST: 11 IU/L (ref 0–40)
Albumin/Globulin Ratio: 2 (ref 1.2–2.2)
Albumin: 4.5 g/dL (ref 3.8–4.8)
Alkaline Phosphatase: 87 IU/L (ref 48–121)
BUN/Creatinine Ratio: 24 — ABNORMAL HIGH (ref 9–23)
BUN: 14 mg/dL (ref 6–20)
Bilirubin Total: 0.3 mg/dL (ref 0.0–1.2)
CO2: 22 mmol/L (ref 20–29)
Calcium: 9.1 mg/dL (ref 8.7–10.2)
Chloride: 108 mmol/L — ABNORMAL HIGH (ref 96–106)
Creatinine, Ser: 0.58 mg/dL (ref 0.57–1.00)
GFR calc Af Amer: 139 mL/min/{1.73_m2} (ref >59)
GFR calc non Af Amer: 121 mL/min/{1.73_m2} (ref >59)
Globulin, Total: 2.3 g/dL (ref 1.5–4.5)
Glucose: 85 mg/dL (ref 65–99)
Potassium: 4.1 mmol/L (ref 3.5–5.2)
Sodium: 142 mmol/L (ref 134–144)
Total Protein: 6.8 g/dL (ref 6.0–8.5)

## 2020-01-07 LAB — LIPID PANEL
Chol/HDL Ratio: 3.2 ratio (ref 0.0–4.4)
Cholesterol, Total: 161 mg/dL (ref 100–199)
HDL: 51 mg/dL (ref >39)
LDL Chol Calc (NIH): 101 mg/dL — ABNORMAL HIGH (ref 0–99)
Triglycerides: 39 mg/dL (ref 0–149)
VLDL Cholesterol Cal: 9 mg/dL (ref 5–40)

## 2020-01-10 ENCOUNTER — Telehealth: Payer: Self-pay

## 2020-01-10 MED ORDER — DRYSOL 20 % EX SOLN: Freq: Every day | CUTANEOUS | 0 refills | Status: DC

## 2020-01-10 MED FILL — DRYSOL DAB-O-MATIC SOLUTION: 20 | 30 days supply | Qty: 35 | Fill #0

## 2020-01-10 NOTE — Telephone Encounter (Signed)
Sent in rx   drysol use nightly

## 2020-01-10 NOTE — Telephone Encounter (Signed)
Patient talked with student when she came to her appt but forgot to mention to you. She has excessive sweating. She sweats through her clothes daily. She has tried all deoderants. Any suggestions?

## 2020-01-11 ENCOUNTER — Ambulatory Visit: Payer: 59 | Admitting: Neurology

## 2020-01-11 MED FILL — PROPRANOLOL HCL ER 60 MG CP: 60 | 30 days supply | Qty: 30 | Fill #3

## 2020-01-17 NOTE — Progress Notes (Signed)
Consent Form Botulism Toxin Injection For Chronic Migraine  01/18/2020: Baseline is daily headaches, 10-15 migraine days a month. +a. Dry needling for myofascial cervical pain syndrome - she declined. She improved to 60% migraine frequency with botox. Nurtec helps more than Ubrelvy. She took all three of the CGRPs and failed. +masseters. Nurtec works better.    Reviewed orally with patient, additionally signature is on file:  Botulism toxin has been approved by the Federal drug administration for treatment of chronic migraine. Botulism toxin does not cure chronic migraine and it may not be effective in some patients.  The administration of botulism toxin is accomplished by injecting a small amount of toxin into the muscles of the neck and head. Dosage must be titrated for each individual. Any benefits resulting from botulism toxin tend to wear off after 3 months with a repeat injection required if benefit is to be maintained. Injections are usually done every 3-4 months with maximum effect peak achieved by about 2 or 3 weeks. Botulism toxin is expensive and you should be sure of what costs you will incur resulting from the injection.  The side effects of botulism toxin use for chronic migraine may include:   -Transient, and usually mild, facial weakness with facial injections  -Transient, and usually mild, head or neck weakness with head/neck injections  -Reduction or loss of forehead facial animation due to forehead muscle weakness  -Eyelid drooping  -Dry eye  -Pain at the site of injection or bruising at the site of injection  -Double vision  -Potential unknown long term risks  Contraindications: You should not have Botox if you are pregnant, nursing, allergic to albumin, have an infection, skin condition, or muscle weakness at the site of the injection, or have myasthenia gravis, Lambert-Eaton syndrome, or ALS.  It is also possible that as with any injection, there may be an allergic  reaction or no effect from the medication. Reduced effectiveness after repeated injections is sometimes seen and rarely infection at the injection site may occur. All care will be taken to prevent these side effects. If therapy is given over a long time, atrophy and wasting in the muscle injected may occur. Occasionally the patient's become refractory to treatment because they develop antibodies to the toxin. In this event, therapy needs to be modified.  I have read the above information and consent to the administration of botulism toxin.    BOTOX PROCEDURE NOTE FOR MIGRAINE HEADACHE    Contraindications and precautions discussed with patient(above). Aseptic procedure was observed and patient tolerated procedure. Procedure performed by Dr. Artemio Aly  The condition has existed for more than 6 months, and pt does not have a diagnosis of ALS, Myasthenia Gravis or Lambert-Eaton Syndrome.  Risks and benefits of injections discussed and pt agrees to proceed with the procedure.  Written consent obtained  These injections are medically necessary. Pt  receives good benefits from these injections. These injections do not cause sedations or hallucinations which the oral therapies may cause.  Description of procedure:  The patient was placed in a sitting position. The standard protocol was used for Botox as follows, with 5 units of Botox injected at each site:   -Procerus muscle, midline injection  -Corrugator muscle, bilateral injection  -Frontalis muscle, bilateral injection, with 2 sites each side, medial injection was performed in the upper one third of the frontalis muscle, in the region vertical from the medial inferior edge of the superior orbital rim. The lateral injection was again in the  upper one third of the forehead vertically above the lateral limbus of the cornea, 1.5 cm lateral to the medial injection site.  - Levator Scapulae: 5 units bilaterally  -Temporalis muscle injection, 5  sites, bilaterally. The first injection was 3 cm above the tragus of the ear, second injection site was 1.5 cm to 3 cm up from the first injection site in line with the tragus of the ear. The third injection site was 1.5-3 cm forward between the first 2 injection sites. The fourth injection site was 1.5 cm posterior to the second injection site. 5th site laterally in the temporalis  muscleat the level of the outer canthus.  - Patient feels her clenching is a trigger for headaches. +5 units masseter bilaterally   - Patient feels the migraines are centered around the eyes +5 units bilaterally at the outer canthus in the orbicularis occuli  -Occipitalis muscle injection, 3 sites, bilaterally. The first injection was done one half way between the occipital protuberance and the tip of the mastoid process behind the ear. The second injection site was done lateral and superior to the first, 1 fingerbreadth from the first injection. The third injection site was 1 fingerbreadth superiorly and medially from the first injection site.  -Cervical paraspinal muscle injection, 2 sites, bilateral knee first injection site was 1 cm from the midline of the cervical spine, 3 cm inferior to the lower border of the occipital protuberance. The second injection site was 1.5 cm superiorly and laterally to the first injection site.  -Trapezius muscle injection was performed at 3 sites, bilaterally. The first injection site was in the upper trapezius muscle halfway between the inflection point of the neck, and the acromion. The second injection site was one half way between the acromion and the first injection site. The third injection was done between the first injection site and the inflection point of the neck.   Will return for repeat injection in 3 months.   A 200 unit sof Botox was used, any Botox not injected was wasted. The patient tolerated the procedure well, there were no complications of the above  procedure.

## 2020-01-18 ENCOUNTER — Other Ambulatory Visit: Payer: Self-pay | Admitting: Neurology

## 2020-01-18 ENCOUNTER — Ambulatory Visit (INDEPENDENT_AMBULATORY_CARE_PROVIDER_SITE_OTHER): Payer: 59 | Admitting: Neurology

## 2020-01-18 DIAGNOSIS — G43711 Chronic migraine without aura, intractable, with status migrainosus: Secondary | ICD-10-CM

## 2020-01-18 MED ORDER — NURTEC 75 MG PO TBDP: 75.0000 mg | ORAL_TABLET | Freq: Once | ORAL | 11 refills | Status: DC | PRN

## 2020-01-18 MED ORDER — TOPIRAMATE 100 MG PO TABS: 100.0000 mg | ORAL_TABLET | Freq: Two times a day (BID) | ORAL | 4 refills | Status: DC

## 2020-01-18 MED FILL — NURTEC 75 MG TBDP: 75 | 30 days supply | Qty: 8 | Fill #0

## 2020-01-18 NOTE — Progress Notes (Signed)
Botox- 200 units x 1 vial Lot: [id number] Expiration: 09/2022 NDC: 0023-3921-02  Bacteriostatic 0.9% Sodium Chloride- 4mL total Lot: [id number] Expiration: 04/06/2021 NDC: 0409-1966-02  Dx: G43.711 B/B  

## 2020-01-20 ENCOUNTER — Encounter: Payer: Self-pay | Admitting: Nurse Practitioner

## 2020-01-20 NOTE — Telephone Encounter (Signed)
Seen 07/02

## 2020-01-25 ENCOUNTER — Other Ambulatory Visit: Payer: Self-pay | Admitting: Nurse Practitioner

## 2020-01-25 DIAGNOSIS — F5101 Primary insomnia: Secondary | ICD-10-CM

## 2020-01-25 MED ORDER — ZOLPIDEM TARTRATE 10 MG PO TABS: 10.0000 mg | ORAL_TABLET | Freq: Every evening | ORAL | 5 refills | Status: DC | PRN

## 2020-01-26 ENCOUNTER — Other Ambulatory Visit: Payer: Self-pay | Admitting: *Deleted

## 2020-01-26 DIAGNOSIS — L700 Acne vulgaris: Secondary | ICD-10-CM

## 2020-01-26 MED ORDER — MINOCYCLINE HCL 100 MG PO CAPS: 100.0000 mg | ORAL_CAPSULE | Freq: Every day | ORAL | 0 refills | Status: DC

## 2020-01-26 MED FILL — ZOLPIDEM TARTRATE 10 MG TAB: 10 | 30 days supply | Qty: 30 | Fill #0

## 2020-01-26 MED FILL — MINOCYCLINE 100 MG CAPSULE: 100 | 90 days supply | Qty: 90 | Fill #0

## 2020-01-31 ENCOUNTER — Encounter: Payer: Self-pay | Admitting: Nurse Practitioner

## 2020-01-31 ENCOUNTER — Ambulatory Visit: Payer: 59 | Admitting: Nurse Practitioner

## 2020-01-31 ENCOUNTER — Other Ambulatory Visit: Payer: Self-pay

## 2020-01-31 VITALS — BP 106/77 | HR 98 | Temp 99.0°F | Resp 20 | Ht 61.0 in | Wt 146.0 lb

## 2020-01-31 DIAGNOSIS — M79641 Pain in right hand: Secondary | ICD-10-CM

## 2020-01-31 DIAGNOSIS — M79642 Pain in left hand: Secondary | ICD-10-CM

## 2020-01-31 NOTE — Patient Instructions (Signed)
Carpal Tunnel Syndrome  Carpal tunnel syndrome is a condition that causes pain in your hand and arm. The carpal tunnel is a narrow area that is on the palm side of your wrist. Repeated wrist motion or certain diseases may cause swelling in the tunnel. This swelling can pinch the main nerve in the wrist (median nerve). What are the causes? This condition may be caused by:  Repeated wrist motions.  Wrist injuries.  Arthritis.  A sac of fluid (cyst) or abnormal growth (tumor) in the carpal tunnel.  Fluid buildup during pregnancy. Sometimes the cause is not known. What increases the risk? The following factors may make you more likely to develop this condition:  Having a job in which you move your wrist in the same way many times. This includes jobs like being a butcher or a cashier.  Being a woman.  Having other health conditions, such as: ? Diabetes. ? Obesity. ? A thyroid gland that is not active enough (hypothyroidism). ? Kidney failure. What are the signs or symptoms? Symptoms of this condition include:  A tingling feeling in your fingers.  Tingling or a loss of feeling (numbness) in your hand.  Pain in your entire arm. This pain may get worse when you bend your wrist and elbow for a long time.  Pain in your wrist that goes up your arm to your shoulder.  Pain that goes down into your palm or fingers.  A weak feeling in your hands. You may find it hard to grab and hold items. You may feel worse at night. How is this diagnosed? This condition is diagnosed with a medical history and physical exam. You may also have tests, such as:  Electromyogram (EMG). This test checks the signals that the nerves send to the muscles.  Nerve conduction study. This test checks how well signals pass through your nerves.  Imaging tests, such as X-rays, ultrasound, and MRI. These tests check for what might be the cause of your condition. How is this treated? This condition may be treated  with:  Lifestyle changes. You will be asked to stop or change the activity that caused your problem.  Doing exercise and activities that make bones and muscles stronger (physical therapy).  Learning how to use your hand again (occupational therapy).  Medicines for pain and swelling (inflammation). You may have injections in your wrist.  A wrist splint.  Surgery. Follow these instructions at home: If you have a splint:  Wear the splint as told by your doctor. Remove it only as told by your doctor.  Loosen the splint if your fingers: ? Tingle. ? Lose feeling (become numb). ? Turn cold and blue.  Keep the splint clean.  If the splint is not waterproof: ? Do not let it get wet. ? Cover it with a watertight covering when you take a bath or a shower. Managing pain, stiffness, and swelling   If told, put ice on the painful area: ? If you have a removable splint, remove it as told by your doctor. ? Put ice in a plastic bag. ? Place a towel between your skin and the bag. ? Leave the ice on for 20 minutes, 2-3 times per day. General instructions  Take over-the-counter and prescription medicines only as told by your doctor.  Rest your wrist from any activity that may cause pain. If needed, talk with your boss at work about changes that can help your wrist heal.  Do any exercises as told by your doctor,   physical therapist, or occupational therapist.  Keep all follow-up visits as told by your doctor. This is important. Contact a doctor if:  You have new symptoms.  Medicine does not help your pain.  Your symptoms get worse. Get help right away if:  You have very bad numbness or tingling in your wrist or hand. Summary  Carpal tunnel syndrome is a condition that causes pain in your hand and arm.  It is often caused by repeated wrist motions.  Lifestyle changes and medicines are used to treat this problem. Surgery may help in very bad cases.  Follow your doctor's  instructions about wearing a splint, resting your wrist, keeping follow-up visits, and calling for help. This information is not intended to replace advice given to you by your health care provider. Make sure you discuss any questions you have with your health care provider. Document Revised: 10/30/2017 Document Reviewed: 10/30/2017 Elsevier Patient Education  2020 [employer]  

## 2020-01-31 NOTE — Progress Notes (Signed)
   Subjective:    Patient ID: Erica Rodriguez, female    DOB: [birth year], 34 y.o.   MRN: [id number]   Chief Complaint: both hands painful and burning   HPI Patient come sin today c/o bil hands hurting. Pain starts at base of thumb and radiates upward into th etip of all of fingers. Hurts to grip or squeeze bottles. She acnt even squeeze shampoo bottle herself. They do get numb at night and wake her up.    Review of Systems  Respiratory: Negative.   Cardiovascular: Negative.   Musculoskeletal: Positive for arthralgias (bil hands).  All other systems reviewed and are negative.      Objective:   Physical Exam Vitals and nursing note reviewed.  Constitutional:      Appearance: Normal appearance.  Cardiovascular:     Rate and Rhythm: Normal rate and regular rhythm.     Heart sounds: Normal heart sounds.  Pulmonary:     Breath sounds: Normal breath sounds.  Musculoskeletal:     Comments: bil hand pain with gripping (+) tinel bil (+) phalen Bil  Skin:    General: Skin is warm.  Neurological:     General: No focal deficit present.     Mental Status: She is alert and oriented to person, place, and time.  Psychiatric:        Mood and Affect: Mood normal.        Behavior: Behavior normal.    BP 106/77   Pulse 98   Temp 99 F (37.2 C) (Temporal)   Resp 20   Ht 5\' 1"  (1.549 m)   Wt 146 lb (66.2 kg)   SpO2 100%   BMI 27.59 kg/m         Assessment & Plan:  in today with chief complaint of both hands painful and burning   1. Pain in both hands Hand brace to right hand to see oif helps Referral to ortho motrin OTC as needed - Ambulatory referral to Orthopedic Surgery    The above assessment and management plan was discussed with the patient. The patient verbalized understanding of and has agreed to the management plan. Patient is aware to call the clinic if symptoms persist or worsen. Patient is aware when to return to the clinic for a follow-up visit.  Patient educated on when it is appropriate to go to the emergency department.   [name] [name], FNP

## 2020-02-03 ENCOUNTER — Ambulatory Visit: Payer: 59 | Admitting: Medical

## 2020-02-03 ENCOUNTER — Encounter: Payer: Self-pay | Admitting: Medical

## 2020-02-03 ENCOUNTER — Other Ambulatory Visit: Payer: Self-pay

## 2020-02-03 VITALS — BP 110/76 | HR 82 | Ht 61.0 in | Wt 146.0 lb

## 2020-02-03 DIAGNOSIS — R200 Anesthesia of skin: Secondary | ICD-10-CM | POA: Diagnosis not present

## 2020-02-03 DIAGNOSIS — M25531 Pain in right wrist: Secondary | ICD-10-CM | POA: Insufficient documentation

## 2020-02-03 DIAGNOSIS — M25532 Pain in left wrist: Secondary | ICD-10-CM

## 2020-02-03 MED ORDER — HYDROCODONE-ACETAMINOPHEN 5-325 MG PO TABS: 1.0000 | ORAL_TABLET | Freq: Two times a day (BID) | ORAL | 0 refills | Status: DC | PRN

## 2020-02-03 MED FILL — HYDROCODON-APAP 5-325: 5-325 | 8 days supply | Qty: 15 | Fill #0

## 2020-02-03 NOTE — Progress Notes (Signed)
Subjective:  Erica Rodriguez is a 34 y.o. female who presents for Chief Complaint  Patient presents with  . Hand Pain    right     Here for pain, concern for carpal tunnel.  She saw her PCP recently for the same is awaiting carpal tunnel nerve conduction test.  However her pain has worsened.  Here for acute pain today  She notes several week history of pain in both hands pain in both wrists, numbness and tingling particular in the fingertips of all fingers.  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  She is wearing carpal tunnel splint on the right hand since 3 days ago.  Numbness started 6 months + in hands.  Pain got kicked up 2 weeks ago.  For a while now though had weakness squeezing the shampoo bottle.   No obvious swelling.  Strength doesn't seem all that less.  Pain often in base of thumb and wrist, but no specific pain with wrist of finger ROM.   Worse symptoms lately using mouse at work.   Recently got padded keyboard for these symptoms.   Using ibuprofen 800mg  TID x a week and a half.  In med list, Medrol dose pak is a back up for migraine, but not currently using this.  No other aggravating or relieving factors.    No other c/o.  The following portions of the patient's history were reviewed and updated as appropriate: allergies, current medications, past family history, past medical history, past social history, past surgical history and problem list.  ROS Otherwise as in subjective above    Objective: BP 110/76   Pulse 82   Ht 5\' 1"  (1.549 m)   Wt 146 lb (66.2 kg)   SpO2 99%   BMI 27.59 kg/m   General appearance: alert, no distress, well developed, well nourished Bilateral hands without obvious swelling, no atrophy of muscles, Tinel's negative Phalen positive, normal strength and sensation in fingers and hands, mildly tender over volar wrist bilaterally, left wrist extension slightly reduced, otherwise wrist range of motion normal finger range of motion  normal Rest of arm is nontender without deformity Otherwise arms neurovascularly intact    Assessment: Encounter Diagnoses  Name Primary?  . Bilateral wrist pain Yes  . Bilateral hand numbness      Plan: We discussed symptoms and concerns.  She has been already been doing high-dose ibuprofen for the last 1+ week, she started wrist but this week.  She is in a lot of pain the last 2 days.  Prescription below for short-term pain relief.  Discussed risk and benefits of the medicine and caution with this medicine as it can cause drowsiness.  We discussed other potential treatment options including gabapentin or other neuropathic pain medicine versus steroid injection, versus conservative care.  She still has nerve conduction studies pending in the next 2 weeks  She request trial of steroid injection.     Discussed risk and benefits of procedure.  Patient gives consent.  Cleaned and prepped area in usual sterile fashion.   Injected the right wrist with the ulnar approach, ulnar side of palmaris longus, 1cm medial to palmaris longus tendon, 1cm proximal to distal wrist crease, 30 degree angle, using  20 mg of Kenalog in 1.5 cc of 1% Lidocaine with a 22-gauge 1.5 inch needle (3cc syringe).  Estimated blood loss less than 1 cc, patient tolerated procedure well.  Discussed aftercare.     Erica Rodriguez was seen today  for hand pain.  Diagnoses and all orders for this visit:  Bilateral wrist pain  Bilateral hand numbness  Other orders -     HYDROcodone-acetaminophen (NORCO) 5-325 MG tablet; Take 1 tablet by mouth 2 (two) times daily as needed.    Follow up: call report 1 wk

## 2020-02-10 ENCOUNTER — Other Ambulatory Visit: Payer: Self-pay | Admitting: Neurology

## 2020-02-10 DIAGNOSIS — M79641 Pain in right hand: Secondary | ICD-10-CM | POA: Insufficient documentation

## 2020-02-11 ENCOUNTER — Telehealth: Payer: 59 | Admitting: Nurse Practitioner

## 2020-02-11 DIAGNOSIS — N3000 Acute cystitis without hematuria: Secondary | ICD-10-CM

## 2020-02-11 MED ORDER — NITROFURANTOIN MONOHYD MACRO 100 MG PO CAPS
100.0000 mg | ORAL_CAPSULE | Freq: Two times a day (BID) | ORAL | 0 refills | Status: DC
Start: 2020-02-11 — End: 2020-03-08

## 2020-02-11 NOTE — Progress Notes (Signed)

## 2020-02-13 ENCOUNTER — Other Ambulatory Visit: Payer: Self-pay | Admitting: Neurology

## 2020-02-13 MED FILL — NURTEC 75 MG TBDP: 75 | 30 days supply | Qty: 8 | Fill #1

## 2020-02-13 MED FILL — PROPRANOLOL HCL ER 60 MG CP: 60 | 30 days supply | Qty: 30 | Fill #0

## 2020-02-15 DIAGNOSIS — G5603 Carpal tunnel syndrome, bilateral upper limbs: Secondary | ICD-10-CM | POA: Insufficient documentation

## 2020-02-15 DIAGNOSIS — M79642 Pain in left hand: Secondary | ICD-10-CM | POA: Diagnosis not present

## 2020-02-15 DIAGNOSIS — M79641 Pain in right hand: Secondary | ICD-10-CM | POA: Diagnosis not present

## 2020-02-22 DIAGNOSIS — Z1151 Encounter for screening for human papillomavirus (HPV): Secondary | ICD-10-CM | POA: Diagnosis not present

## 2020-02-22 DIAGNOSIS — Z01419 Encounter for gynecological examination (general) (routine) without abnormal findings: Secondary | ICD-10-CM | POA: Diagnosis not present

## 2020-02-22 DIAGNOSIS — Z975 Presence of (intrauterine) contraceptive device: Secondary | ICD-10-CM | POA: Diagnosis not present

## 2020-02-22 DIAGNOSIS — Z6827 Body mass index (BMI) 27.0-27.9, adult: Secondary | ICD-10-CM | POA: Diagnosis not present

## 2020-02-24 ENCOUNTER — Other Ambulatory Visit: Payer: Self-pay | Admitting: Neurology

## 2020-02-24 DIAGNOSIS — M62838 Other muscle spasm: Secondary | ICD-10-CM

## 2020-02-24 MED FILL — KETOROLAC TROMETHAMINE 60 M: 60 | 5 days supply | Qty: 10 | Fill #3

## 2020-02-24 MED FILL — TOPIRAMATE 100 MG TABLET: 100 | 90 days supply | Qty: 180 | Fill #0

## 2020-02-24 MED FILL — ZOLPIDEM TARTRATE 10 MG TAB: 10 | 30 days supply | Qty: 30 | Fill #1

## 2020-02-24 MED FILL — CYANOCOBALAMIN 1,000 MCG/ML: 1000 | 90 days supply | Qty: 3 | Fill #1

## 2020-02-27 ENCOUNTER — Other Ambulatory Visit: Payer: Self-pay | Admitting: Neurology

## 2020-02-27 MED FILL — CYCLOBENZAPRINE HCL 5 MG TA: 5 | 20 days supply | Qty: 60 | Fill #0

## 2020-03-02 ENCOUNTER — Ambulatory Visit (INDEPENDENT_AMBULATORY_CARE_PROVIDER_SITE_OTHER): Payer: 59

## 2020-03-02 ENCOUNTER — Other Ambulatory Visit: Payer: Self-pay

## 2020-03-02 ENCOUNTER — Ambulatory Visit: Payer: 59 | Admitting: Podiatry

## 2020-03-02 DIAGNOSIS — M722 Plantar fascial fibromatosis: Secondary | ICD-10-CM

## 2020-03-02 DIAGNOSIS — S9032XA Contusion of left foot, initial encounter: Secondary | ICD-10-CM

## 2020-03-02 DIAGNOSIS — S9031XA Contusion of right foot, initial encounter: Secondary | ICD-10-CM

## 2020-03-02 NOTE — Patient Instructions (Signed)

## 2020-03-05 ENCOUNTER — Ambulatory Visit: Payer: 59

## 2020-03-05 ENCOUNTER — Other Ambulatory Visit: Payer: Self-pay

## 2020-03-05 DIAGNOSIS — Z7185 Encounter for immunization safety counseling: Secondary | ICD-10-CM

## 2020-03-05 MED ORDER — COVID-19 MRNA VACCINE (PFIZER) 30 MCG/0.3ML IM SUSP: 0.3000 mL | Freq: Once | INTRAMUSCULAR | 0 refills | Status: DC

## 2020-03-06 ENCOUNTER — Encounter: Payer: Self-pay | Admitting: Podiatry

## 2020-03-06 NOTE — Progress Notes (Signed)
Subjective:  Patient ID: Erica Rodriguez, female    DOB: [birth year],  MRN: [id number]  Chief Complaint  Patient presents with  . Foot Pain    B/L heel pain- burning pressure aches- during and after shift pt is having major bruning feeling-     34 y.o. female presents with the above complaint.  Patient presents with bilateral heel pain that has been going on for a long period of time.  Patient states she works Long shifts and she is constantly on her foot.  After pressure there is burning and aching.  Patient states the elbow is gone for a year.  Patient stated there is no feeling associated with it.  She states her pain scale is 7 out of 10 and sharp shooting in nature.  She denies any other acute complaints.  She has not seen anyone else prior to seeing me for this.  She would like to discuss treatment options   Review of Systems: Negative except as noted in the HPI. Denies N/V/F/Ch.  Past Medical History:  Diagnosis Date  . Depression   . Headache    migraines  . Mild preeclampsia     Current Outpatient Medications:  .  aluminum chloride (DRYSOL) 20 % external solution, Apply topically at bedtime., Disp: 35 mL, Rfl: 0 .  azelastine (ASTELIN) 0.1 % nasal spray, Place 2 sprays into both nostrils 2 (two) times daily. Use in each nostril as directed, Disp: 30 mL, Rfl: 12 .  clindamycin-benzoyl peroxide (BENZACLIN) gel, Apply topically 2 (two) times daily., Disp: 25 g, Rfl: 0 .  cyanocobalamin (,VITAMIN B-12,) 1000 MCG/ML injection, Inject 1 mL (1,000 mcg total) into the muscle every 30 (thirty) days., Disp: 3 mL, Rfl: 1 .  cyclobenzaprine (FLEXERIL) 5 MG tablet, TAKE 1 TABLET BY MOUTH 3 TIMES A DAY AS NEEDED FOR MUSCLE SPASMS, Disp: 60 tablet, Rfl: 2 .  fluticasone (FLONASE) 50 MCG/ACT nasal spray, Place 2 sprays into both nostrils daily., Disp: 16 g, Rfl: 6 .  HYDROcodone-acetaminophen (NORCO) 5-325 MG tablet, Take 1 tablet by mouth 2 (two) times daily as needed., Disp: 15 tablet, Rfl: 0 .   ketorolac (TORADOL) 60 MG/2ML SOLN injection, INJECT (60MG ) INTRAMUSCULARLY AT ONSET OF MIGRAINE. MAY REPEAT IN 6 HOURS. MAX TWICE A DAY AND 4 DAYS PER MONTH., Disp: 10 mL, Rfl: 4 .  methylPREDNISolone (MEDROL DOSEPAK) 4 MG TBPK tablet, Take all pills in the morning with food for 6 days., Disp: 21 tablet, Rfl: 1 .  minocycline (MINOCIN) 100 MG capsule, Take 1 capsule (100 mg total) by mouth daily., Disp: 90 capsule, Rfl: 0 .  MIRENA, 52 MG, 20 MCG/24HR IUD, , Disp: , Rfl:  .  nitrofurantoin, macrocrystal-monohydrate, (MACROBID) 100 MG capsule, Take 1 capsule (100 mg total) by mouth 2 (two) times daily. 1 po BId, Disp: 10 capsule, Rfl: 0 .  ondansetron (ZOFRAN ODT) 4 MG disintegrating tablet, Take 1-2 tablets (4-8 mg total) by mouth every 8 (eight) hours as needed for nausea or vomiting., Disp: 30 tablet, Rfl: 11 .  propranolol ER (INDERAL LA) 60 MG 24 hr capsule, TAKE 1 CAPSULE (60 MG TOTAL) BY MOUTH DAILY., Disp: 30 capsule, Rfl: 3 .  Rimegepant Sulfate (NURTEC) 75 MG TBDP, Take 75 mg by mouth once as needed for up to 1 dose. Try to take as soon as possible for migraine. Once daily., Disp: 8 tablet, Rfl: 11 .  topiramate (TOPAMAX) 100 MG tablet, Take 1 tablet (100 mg total) by mouth 2 (two) times  daily., Disp: 180 tablet, Rfl: 4 .  Ubrogepant (UBRELVY) 50 MG TABS, Take 50-100 mg by mouth every 2 (two) hours as needed. Max 200mg  a day, Disp: 10 tablet, Rfl: 6 .  zolpidem (AMBIEN) 10 MG tablet, Take 1 tablet (10 mg total) by mouth at bedtime as needed for sleep., Disp: 30 tablet, Rfl: 5  Current Facility-Administered Medications:  .  cyanocobalamin ((VITAMIN B-12)) injection 1,000 mcg, 1,000 mcg, Intramuscular, Q30 days, , [name], FNP, 1,000 mcg at 05/26/19 1035  Social History   Tobacco Use  Smoking Status Never Smoker  Smokeless Tobacco Never Used    No Known Allergies Objective:  There were no vitals filed for this visit. There is no height or weight on file to calculate  BMI. Constitutional Well developed. Well nourished.  Vascular Dorsalis pedis pulses palpable bilaterally. Posterior tibial pulses palpable bilaterally. Capillary refill normal to all digits.  No cyanosis or clubbing noted. Pedal hair growth normal.  Neurologic Normal speech. Oriented to person, place, and time. Epicritic sensation to light touch grossly present bilaterally.  Dermatologic Nails well groomed and normal in appearance. No open wounds. No skin lesions.  Orthopedic: Normal joint ROM without pain or crepitus bilaterally. No visible deformities. Tender to palpation at the calcaneal tuber bilaterally. No pain with calcaneal squeeze bilaterally. Ankle ROM full range of motion bilaterally. Silfverskiold Test: negative bilaterally.   Radiographs: Taken and reviewed. No acute fractures or dislocations. No evidence of stress fracture.  Plantar heel spur absent. Posterior heel spur absent.   Assessment:   1. Contusion of left foot, initial encounter   2. Contusion of right foot, initial encounter    Plan:  Patient was evaluated and treated and all questions answered.  Plantar Fasciitis, bilaterally - XR reviewed as above.  - Educated on icing and stretching. Instructions given.  - Injection delivered to the plantar fascia as below. - DME: Plantar Fascial Brace x2 - Pharmacologic management: None  Procedure: Injection Tendon/Ligament Location: Bilateral plantar fascia at the glabrous junction; medial approach. Skin Prep: alcohol Injectate: 0.5 cc 0.5% marcaine plain, 0.5 cc of 1% Lidocaine, 0.5 cc kenalog 10. Disposition: Patient tolerated procedure well. Injection site dressed with a band-aid.  No follow-ups on file.

## 2020-03-08 ENCOUNTER — Telehealth: Payer: 59 | Admitting: Physician Assistant

## 2020-03-08 DIAGNOSIS — J019 Acute sinusitis, unspecified: Secondary | ICD-10-CM | POA: Diagnosis not present

## 2020-03-08 MED ORDER — AMOXICILLIN-POT CLAVULANATE 875-125 MG PO TABS: 1.0000 | ORAL_TABLET | Freq: Two times a day (BID) | ORAL | 0 refills | Status: DC

## 2020-03-08 NOTE — Progress Notes (Signed)

## 2020-03-13 DIAGNOSIS — Z0289 Encounter for other administrative examinations: Secondary | ICD-10-CM

## 2020-03-13 NOTE — Telephone Encounter (Signed)
FMLA forms completed, pending MD signature.

## 2020-03-15 NOTE — Telephone Encounter (Signed)
FMLA forms signed and sent to medical records for processing.

## 2020-03-16 MED FILL — PROPRANOLOL HCL ER 60 MG CP: 60 | 30 days supply | Qty: 30 | Fill #1

## 2020-03-19 ENCOUNTER — Telehealth: Payer: 59 | Admitting: Emergency Medicine

## 2020-03-19 DIAGNOSIS — B3731 Acute candidiasis of vulva and vagina: Secondary | ICD-10-CM

## 2020-03-19 DIAGNOSIS — B373 Candidiasis of vulva and vagina: Secondary | ICD-10-CM

## 2020-03-19 MED ORDER — FLUCONAZOLE 150 MG PO TABS: 150.0000 mg | ORAL_TABLET | Freq: Once | ORAL | 0 refills | Status: AC

## 2020-03-19 NOTE — Progress Notes (Signed)
We are sorry that you are not feeling well. Here is how we plan to help! Based on what you shared with me it looks like you: May have a yeast vaginosis  Vaginosis is an inflammation of the vagina that can result in discharge, itching and pain. The cause is usually a change in the normal balance of vaginal bacteria or an infection. Vaginosis can also result from reduced estrogen levels after menopause.  The most common causes of vaginosis are:   Bacterial vaginosis which results from an overgrowth of one on several organisms that are normally present in your vagina.   Yeast infections which are caused by a naturally occurring fungus called candida.   Vaginal atrophy (atrophic vaginosis) which results from the thinning of the vagina from reduced estrogen levels after menopause.   Trichomoniasis which is caused by a parasite and is commonly transmitted by sexual intercourse.  Factors that increase your risk of developing vaginosis include: . Medications, such as antibiotics and steroids . Uncontrolled diabetes . Use of hygiene products such as bubble bath, vaginal spray or vaginal deodorant . Douching . Wearing damp or tight-fitting clothing . Using an intrauterine device (IUD) for birth control . Hormonal changes, such as those associated with pregnancy, birth control pills or menopause . Sexual activity . Having a sexually transmitted infection  Your treatment plan is A single Diflucan (fluconazole) 150mg tablet once.  I have electronically sent this prescription into the pharmacy that you have chosen.  Be sure to take all of the medication as directed. Stop taking any medication if you develop a rash, tongue swelling or shortness of breath. Mothers who are breast feeding should consider pumping and discarding their breast milk while on these antibiotics. However, there is no consensus that infant exposure at these doses would be harmful.  Remember that medication creams can weaken latex  condoms. .   HOME CARE:  Good hygiene may prevent some types of vaginosis from recurring and may relieve some symptoms:  . Avoid baths, hot tubs and whirlpool spas. Rinse soap from your outer genital area after a shower, and dry the area well to prevent irritation. Don't use scented or harsh soaps, such as those with deodorant or antibacterial action. . Avoid irritants. These include scented tampons and pads. . Wipe from front to back after using the toilet. Doing so avoids spreading fecal bacteria to your vagina.  Other things that may help prevent vaginosis include:  . Don't douche. Your vagina doesn't require cleansing other than normal bathing. Repetitive douching disrupts the normal organisms that reside in the vagina and can actually increase your risk of vaginal infection. Douching won't clear up a vaginal infection. . Use a latex condom. Both female and female latex condoms may help you avoid infections spread by sexual contact. . Wear cotton underwear. Also wear pantyhose with a cotton crotch. If you feel comfortable without it, skip wearing underwear to bed. Yeast thrives in moist environments Your symptoms should improve in the next day or two.  GET HELP RIGHT AWAY IF:  . You have pain in your lower abdomen ( pelvic area or over your ovaries) . You develop nausea or vomiting . You develop a fever . Your discharge changes or worsens . You have persistent pain with intercourse . You develop shortness of breath, a rapid pulse, or you faint.  These symptoms could be signs of problems or infections that need to be evaluated by a medical provider now.  MAKE SURE YOU      Understand these instructions.  Will watch your condition.  Will get help right away if you are not doing well or get worse.  Your e-visit answers were reviewed by a board certified advanced clinical practitioner to complete your personal care plan. Depending upon the condition, your plan could have included  both over the counter or prescription medications. Please review your pharmacy choice to make sure that you have choses a pharmacy that is open for you to pick up any needed prescription, Your safety is important to us. If you have drug allergies check your prescription carefully.   You can use MyChart to ask questions about today's visit, request a non-urgent call back, or ask for a work or school excuse for 24 hours related to this e-Visit. If it has been greater than 24 hours you will need to follow up with your provider, or enter a new e-Visit to address those concerns. You will get a MyChart message within the next two days asking about your experience. I hope that your e-visit has been valuable and will speed your recovery.  **Please do not respond to this message unless you have follow up questions.** Greater than 5 but less than 10 minutes spent researching, coordinating, and implementing care for this patient today  

## 2020-03-21 DIAGNOSIS — R87612 Low grade squamous intraepithelial lesion on cytologic smear of cervix (LGSIL): Secondary | ICD-10-CM | POA: Diagnosis not present

## 2020-03-22 ENCOUNTER — Telehealth: Payer: 59 | Admitting: Physician Assistant

## 2020-03-22 DIAGNOSIS — B373 Candidiasis of vulva and vagina: Secondary | ICD-10-CM | POA: Diagnosis not present

## 2020-03-22 DIAGNOSIS — B3731 Acute candidiasis of vulva and vagina: Secondary | ICD-10-CM

## 2020-03-22 MED ORDER — FLUCONAZOLE 150 MG PO TABS: 150.0000 mg | ORAL_TABLET | ORAL | 0 refills | Status: DC

## 2020-03-22 NOTE — Progress Notes (Signed)
We are sorry that you are not feeling well. Here is how we plan to help! Based on what you shared with me it looks like you: May have a yeast vaginosis  Vaginosis is an inflammation of the vagina that can result in discharge, itching and pain. The cause is usually a change in the normal balance of vaginal bacteria or an infection. Vaginosis can also result from reduced estrogen levels after menopause.  The most common causes of vaginosis are:   Bacterial vaginosis which results from an overgrowth of one on several organisms that are normally present in your vagina.   Yeast infections which are caused by a naturally occurring fungus called candida.   Vaginal atrophy (atrophic vaginosis) which results from the thinning of the vagina from reduced estrogen levels after menopause.   Trichomoniasis which is caused by a parasite and is commonly transmitted by sexual intercourse.  Factors that increase your risk of developing vaginosis include: . Medications, such as antibiotics and steroids . Uncontrolled diabetes . Use of hygiene products such as bubble bath, vaginal spray or vaginal deodorant . Douching . Wearing damp or tight-fitting clothing . Using an intrauterine device (IUD) for birth control . Hormonal changes, such as those associated with pregnancy, birth control pills or menopause . Sexual activity . Having a sexually transmitted infection  Your treatment plan is A single Diflucan (fluconazole) 150mg tablet once.  I have electronically sent this prescription into the pharmacy that you have chosen.  Be sure to take all of the medication as directed. Stop taking any medication if you develop a rash, tongue swelling or shortness of breath. Mothers who are breast feeding should consider pumping and discarding their breast milk while on these antibiotics. However, there is no consensus that infant exposure at these doses would be harmful.  Remember that medication creams can weaken latex  condoms. .   HOME CARE:  Good hygiene may prevent some types of vaginosis from recurring and may relieve some symptoms:  . Avoid baths, hot tubs and whirlpool spas. Rinse soap from your outer genital area after a shower, and dry the area well to prevent irritation. Don't use scented or harsh soaps, such as those with deodorant or antibacterial action. . Avoid irritants. These include scented tampons and pads. . Wipe from front to back after using the toilet. Doing so avoids spreading fecal bacteria to your vagina.  Other things that may help prevent vaginosis include:  . Don't douche. Your vagina doesn't require cleansing other than normal bathing. Repetitive douching disrupts the normal organisms that reside in the vagina and can actually increase your risk of vaginal infection. Douching won't clear up a vaginal infection. . Use a latex condom. Both female and female latex condoms may help you avoid infections spread by sexual contact. . Wear cotton underwear. Also wear pantyhose with a cotton crotch. If you feel comfortable without it, skip wearing underwear to bed. Yeast thrives in moist environments Your symptoms should improve in the next day or two.  GET HELP RIGHT AWAY IF:  . You have pain in your lower abdomen ( pelvic area or over your ovaries) . You develop nausea or vomiting . You develop a fever . Your discharge changes or worsens . You have persistent pain with intercourse . You develop shortness of breath, a rapid pulse, or you faint.  These symptoms could be signs of problems or infections that need to be evaluated by a medical provider now.  MAKE SURE YOU      Understand these instructions.  Will watch your condition.  Will get help right away if you are not doing well or get worse.    Greater than 5 minutes, yet less than 10 minutes of time have been spent researching, coordinating, and implementing care for this patient today.  Jarold Motto PA-C

## 2020-03-23 DIAGNOSIS — G5603 Carpal tunnel syndrome, bilateral upper limbs: Secondary | ICD-10-CM | POA: Diagnosis not present

## 2020-03-27 MED FILL — ZOLPIDEM TARTRATE 10 MG TAB: 10 | 30 days supply | Qty: 30 | Fill #2

## 2020-03-27 MED FILL — NURTEC 75 MG TBDP: 75 | 30 days supply | Qty: 8 | Fill #2

## 2020-03-30 ENCOUNTER — Ambulatory Visit: Payer: 59 | Admitting: Podiatry

## 2020-03-30 ENCOUNTER — Encounter: Payer: Self-pay | Admitting: Podiatry

## 2020-03-30 ENCOUNTER — Other Ambulatory Visit: Payer: Self-pay

## 2020-03-30 DIAGNOSIS — M722 Plantar fascial fibromatosis: Secondary | ICD-10-CM | POA: Diagnosis not present

## 2020-03-30 MED ORDER — METHYLPREDNISOLONE 4 MG PO TBPK: ORAL_TABLET | ORAL | 1 refills | Status: DC

## 2020-03-30 MED ORDER — MELOXICAM 7.5 MG PO TABS: 7.5000 mg | ORAL_TABLET | Freq: Every day | ORAL | 1 refills | Status: AC

## 2020-03-30 MED ORDER — METHYLPREDNISOLONE 4 MG PO TBPK: ORAL_TABLET | Freq: Four times a day (QID) | ORAL | 2 refills | Status: DC

## 2020-04-02 DIAGNOSIS — G5603 Carpal tunnel syndrome, bilateral upper limbs: Secondary | ICD-10-CM | POA: Diagnosis not present

## 2020-04-03 ENCOUNTER — Encounter: Payer: Self-pay | Admitting: Podiatry

## 2020-04-03 NOTE — Progress Notes (Signed)
Subjective:  Patient ID: Erica Rodriguez, female    DOB: 01/13/1986,  MRN: [id number]  Chief Complaint  Patient presents with  . Plantar Fasciitis    4WK F/U- STILL HAVING PAIN    34 y.o. female presents with the above complaint.  Patient presents with a follow-up of bilateral plantar fasciitis.  Patient states it still hurts a lot especially the right side.  Patient states that there is more aching associated.  She has been wearing the braces injection did not give her as much relief.  She denies any other acute complaints.   Review of Systems: Negative except as noted in the HPI. Denies N/V/F/Ch.  Past Medical History:  Diagnosis Date  . Depression   . Headache    migraines  . Mild preeclampsia     Current Outpatient Medications:  .  aluminum chloride (DRYSOL) 20 % external solution, Apply topically at bedtime., Disp: 35 mL, Rfl: 0 .  amoxicillin-clavulanate (AUGMENTIN) 875-125 MG tablet, Take 1 tablet by mouth 2 (two) times daily. One po bid x 7 days, Disp: 14 tablet, Rfl: 0 .  azelastine (ASTELIN) 0.1 % nasal spray, Place 2 sprays into both nostrils 2 (two) times daily. Use in each nostril as directed, Disp: 30 mL, Rfl: 12 .  clindamycin-benzoyl peroxide (BENZACLIN) gel, Apply topically 2 (two) times daily., Disp: 25 g, Rfl: 0 .  cyanocobalamin (,VITAMIN B-12,) 1000 MCG/ML injection, Inject 1 mL (1,000 mcg total) into the muscle every 30 (thirty) days., Disp: 3 mL, Rfl: 1 .  cyclobenzaprine (FLEXERIL) 5 MG tablet, TAKE 1 TABLET BY MOUTH 3 TIMES A DAY AS NEEDED FOR MUSCLE SPASMS, Disp: 60 tablet, Rfl: 2 .  fluconazole (DIFLUCAN) 150 MG tablet, Take 1 tablet (150 mg total) by mouth once a week. May repeat in 1 week if symptoms unresolved., Disp: 2 tablet, Rfl: 0 .  fluticasone (FLONASE) 50 MCG/ACT nasal spray, Place 2 sprays into both nostrils daily., Disp: 16 g, Rfl: 6 .  HYDROcodone-acetaminophen (NORCO) 5-325 MG tablet, Take 1 tablet by mouth 2 (two) times daily as needed., Disp: 15  tablet, Rfl: 0 .  ketorolac (TORADOL) 60 MG/2ML SOLN injection, INJECT (60MG ) INTRAMUSCULARLY AT ONSET OF MIGRAINE. MAY REPEAT IN 6 HOURS. MAX TWICE A DAY AND 4 DAYS PER MONTH., Disp: 10 mL, Rfl: 4 .  meloxicam (MOBIC) 7.5 MG tablet, Take 1 tablet (7.5 mg total) by mouth daily., Disp: 30 tablet, Rfl: 1 .  methylPREDNISolone (MEDROL DOSEPAK) 4 MG TBPK tablet, Take by mouth taper from 4 doses each day to 1 dose and stop., Disp: 1 tablet, Rfl: 2 .  methylPREDNISolone (MEDROL DOSEPAK) 4 MG TBPK tablet, Take all pills in the morning with food for 6 days., Disp: 21 tablet, Rfl: 1 .  MIRENA, 52 MG, 20 MCG/24HR IUD, , Disp: , Rfl:  .  ondansetron (ZOFRAN ODT) 4 MG disintegrating tablet, Take 1-2 tablets (4-8 mg total) by mouth every 8 (eight) hours as needed for nausea or vomiting., Disp: 30 tablet, Rfl: 11 .  propranolol ER (INDERAL LA) 60 MG 24 hr capsule, TAKE 1 CAPSULE (60 MG TOTAL) BY MOUTH DAILY., Disp: 30 capsule, Rfl: 3 .  Rimegepant Sulfate (NURTEC) 75 MG TBDP, Take 75 mg by mouth once as needed for up to 1 dose. Try to take as soon as possible for migraine. Once daily., Disp: 8 tablet, Rfl: 11 .  topiramate (TOPAMAX) 100 MG tablet, Take 1 tablet (100 mg total) by mouth 2 (two) times daily., Disp: 180 tablet, Rfl:  4 .  Ubrogepant (UBRELVY) 50 MG TABS, Take 50-100 mg by mouth every 2 (two) hours as needed. Max 200mg  a day, Disp: 10 tablet, Rfl: 6 .  zolpidem (AMBIEN) 10 MG tablet, Take 1 tablet (10 mg total) by mouth at bedtime as needed for sleep., Disp: 30 tablet, Rfl: 5  Current Facility-Administered Medications:  .  cyanocobalamin ((VITAMIN B-12)) injection 1,000 mcg, 1,000 mcg, Intramuscular, Q30 days, , [name], FNP, 1,000 mcg at 05/26/19 1035  Social History   Tobacco Use  Smoking Status Never Smoker  Smokeless Tobacco Never Used    No Known Allergies Objective:  There were no vitals filed for this visit. There is no height or weight on file to calculate  BMI. Constitutional Well developed. Well nourished.  Vascular Dorsalis pedis pulses palpable bilaterally. Posterior tibial pulses palpable bilaterally. Capillary refill normal to all digits.  No cyanosis or clubbing noted. Pedal hair growth normal.  Neurologic Normal speech. Oriented to person, place, and time. Epicritic sensation to light touch grossly present bilaterally.  Dermatologic Nails well groomed and normal in appearance. No open wounds. No skin lesions.  Orthopedic: Normal joint ROM without pain or crepitus bilaterally. No visible deformities. Tender to palpation at the calcaneal tuber bilaterally. No pain with calcaneal squeeze bilaterally. Ankle ROM full range of motion bilaterally. Silfverskiold Test: negative bilaterally.   Radiographs: Taken and reviewed. No acute fractures or dislocations. No evidence of stress fracture.  Plantar heel spur absent. Posterior heel spur absent.   Assessment:   1. Plantar fasciitis of left foot   2. Plantar fasciitis of right foot    Plan:  Patient was evaluated and treated and all questions answered.  Plantar Fasciitis, bilaterally right greater than left - XR reviewed as above.  - Educated on icing and stretching. Instructions given.  - Injection delivered to the plantar fascia as below. - DME: Cam boot immobilization - Pharmacologic management: Mobic and Medrol Dosepak  Procedure: Injection Tendon/Ligament Location: Bilateral plantar fascia at the glabrous junction; medial approach. Skin Prep: alcohol Injectate: 0.5 cc 0.5% marcaine plain, 0.5 cc of 1% Lidocaine, 0.5 cc kenalog 10. Disposition: Patient tolerated procedure well. Injection site dressed with a band-aid.  No follow-ups on file.

## 2020-04-04 ENCOUNTER — Encounter: Payer: Self-pay | Admitting: Podiatry

## 2020-04-17 MED FILL — PROPRANOLOL HCL ER 60 MG CP: 60 | 30 days supply | Qty: 30 | Fill #2

## 2020-04-18 ENCOUNTER — Other Ambulatory Visit: Payer: Self-pay

## 2020-04-18 ENCOUNTER — Ambulatory Visit: Payer: 59

## 2020-04-18 DIAGNOSIS — G5603 Carpal tunnel syndrome, bilateral upper limbs: Secondary | ICD-10-CM | POA: Diagnosis not present

## 2020-04-18 DIAGNOSIS — Z23 Encounter for immunization: Secondary | ICD-10-CM

## 2020-04-23 ENCOUNTER — Ambulatory Visit: Payer: 59 | Admitting: Family Medicine

## 2020-04-23 ENCOUNTER — Encounter: Payer: Self-pay | Admitting: Family Medicine

## 2020-04-23 ENCOUNTER — Other Ambulatory Visit: Payer: Self-pay | Admitting: Family Medicine

## 2020-04-23 DIAGNOSIS — G43711 Chronic migraine without aura, intractable, with status migrainosus: Secondary | ICD-10-CM | POA: Diagnosis not present

## 2020-04-23 MED ORDER — NURTEC 75 MG PO TBDP
75.0000 mg | ORAL_TABLET | Freq: Once | ORAL | 11 refills | Status: DC | PRN
Start: 2020-04-23 — End: 2020-04-23

## 2020-04-23 MED ORDER — PROPRANOLOL HCL ER 60 MG PO CP24: 60.0000 mg | ORAL_CAPSULE | Freq: Every day | ORAL | 3 refills | Status: DC

## 2020-04-23 MED ORDER — UBRELVY 50 MG PO TABS
50.0000 mg | ORAL_TABLET | ORAL | 6 refills | Status: DC | PRN
Start: 2020-04-23 — End: 2020-08-16

## 2020-04-23 MED FILL — UBRELVY 50 MG TABS: 50 | 30 days supply | Qty: 10 | Fill #0

## 2020-04-23 MED FILL — NURTEC 75 MG TBDP: 75 | 30 days supply | Qty: 8 | Fill #0

## 2020-04-23 NOTE — Progress Notes (Signed)
Sodium chloride NDC 97588-325-49 lot [id number] 2/23 (4.4 ml).  BOTOX B/B  100units (x2) NDC 3094-0768-08 lot [id number]  Exp 08/2022  (x 2)

## 2020-04-23 NOTE — Progress Notes (Signed)
She is doing well. Botox is helping. She continues topiramate 100mg  BID and propranolol 60mg  daily. She alternates Nurtec and Ubrelvy. She usually uses Nurtec then will take if headache continues. She has about 10-12 migraines per month.  Stress is contributor.    Consent Form Botulism Toxin Injection For Chronic Migraine    Reviewed orally with patient, additionally signature is on file:  Botulism toxin has been approved by the Federal drug administration for treatment of chronic migraine. Botulism toxin does not cure chronic migraine and it may not be effective in some patients.  The administration of botulism toxin is accomplished by injecting a small amount of toxin into the muscles of the neck and head. Dosage must be titrated for each individual. Any benefits resulting from botulism toxin tend to wear off after 3 months with a repeat injection required if benefit is to be maintained. Injections are usually done every 3-4 months with maximum effect peak achieved by about 2 or 3 weeks. Botulism toxin is expensive and you should be sure of what costs you will incur resulting from the injection.  The side effects of botulism toxin use for chronic migraine may include:   -Transient, and usually mild, facial weakness with facial injections  -Transient, and usually mild, head or neck weakness with head/neck injections  -Reduction or loss of forehead facial animation due to forehead muscle weakness  -Eyelid drooping  -Dry eye  -Pain at the site of injection or bruising at the site of injection  -Double vision  -Potential unknown long term risks   Contraindications: You should not have Botox if you are pregnant, nursing, allergic to albumin, have an infection, skin condition, or muscle weakness at the site of the injection, or have myasthenia gravis, Lambert-Eaton syndrome, or ALS.  It is also possible that as with any injection, there may be an allergic reaction or no effect  from the medication. Reduced effectiveness after repeated injections is sometimes seen and rarely infection at the injection site may occur. All care will be taken to prevent these side effects. If therapy is given over a long time, atrophy and wasting in the muscle injected may occur. Occasionally the patient's become refractory to treatment because they develop antibodies to the toxin. In this event, therapy needs to be modified.  I have read the above information and consent to the administration of botulism toxin.    BOTOX PROCEDURE NOTE FOR MIGRAINE HEADACHE  Contraindications and precautions discussed with patient(above). Aseptic procedure was observed and patient tolerated procedure. Procedure performed by , FNP-C.   The condition has existed for more than 6 months, and pt does not have a diagnosis of ALS, Myasthenia Gravis or Lambert-Eaton Syndrome.  Risks and benefits of injections discussed and pt agrees to proceed with the procedure.  Written consent obtained  These injections are medically necessary. Pt  receives good benefits from these injections. These injections do not cause sedations or hallucinations which the oral therapies may cause.   Description of procedure:  The patient was placed in a sitting position. The standard protocol was used for Botox as follows, with 5 units of Botox injected at each site:  -Procerus muscle, midline injection  -Corrugator muscle, bilateral injection  -Frontalis muscle, bilateral injection, with 2 sites each side, medial injection was performed in the upper one third of the frontalis muscle, in the region vertical from the medial inferior edge of the superior orbital rim. The lateral injection was again in the upper  one third of the forehead vertically above the lateral limbus of the cornea, 1.5 cm lateral to the medial injection site.  -Temporalis muscle injection, 4 sites, bilaterally. The first injection was 3 cm above the tragus  of the ear, second injection site was 1.5 cm to 3 cm up from the first injection site in line with the tragus of the ear. The third injection site was 1.5-3 cm forward between the first 2 injection sites. The fourth injection site was 1.5 cm posterior to the second injection site. 5th site laterally in the temporalis  muscleat the level of the outer canthus.  -Occipitalis muscle injection, 3 sites, bilaterally. The first injection was done one half way between the occipital protuberance and the tip of the mastoid process behind the ear. The second injection site was done lateral and superior to the first, 1 fingerbreadth from the first injection. The third injection site was 1 fingerbreadth superiorly and medially from the first injection site.  -Cervical paraspinal muscle injection, 2 sites, bilaterally. The first injection site was 1 cm from the midline of the cervical spine, 3 cm inferior to the lower border of the occipital protuberance. The second injection site was 1.5 cm superiorly and laterally to the first injection site.  -Trapezius muscle injection was performed at 3 sites, bilaterally. The first injection site was in the upper trapezius muscle halfway between the inflection point of the neck, and the acromion. The second injection site was one half way between the acromion and the first injection site. The third injection was done between the first injection site and the inflection point of the neck.   Will return for repeat injection in 3 months.   A total of 200 units of Botox was prepared, 155 units of Botox was injected as documented above, any Botox not injected was wasted. The patient tolerated the procedure well, there were no complications of the above procedure.

## 2020-04-25 MED FILL — ZOLPIDEM TARTRATE 10 MG TAB: 10 | 30 days supply | Qty: 30 | Fill #3

## 2020-04-27 ENCOUNTER — Ambulatory Visit: Payer: 59 | Admitting: Podiatry

## 2020-04-27 ENCOUNTER — Telehealth: Payer: Self-pay

## 2020-04-27 ENCOUNTER — Other Ambulatory Visit: Payer: Self-pay

## 2020-04-27 DIAGNOSIS — Z01818 Encounter for other preprocedural examination: Secondary | ICD-10-CM | POA: Diagnosis not present

## 2020-04-27 DIAGNOSIS — M722 Plantar fascial fibromatosis: Secondary | ICD-10-CM | POA: Diagnosis not present

## 2020-04-27 NOTE — Telephone Encounter (Signed)
DOS 05/21/2020  EPF RT - 39030  [facility] EFFECTIVE DATE - 07/08/2019  PLAN DEDUCTIBLE - $300.00 W/ $0.00 REMAINING OUT OF POCKET - $7900.00 W/ $0923.30 REMAINING COPAY $0.00 COINSURANCE - 60%  SPOKE TO VILLISHA AT [facility], SHE STATED NO PRECERT IS REQUIRED FOR CPT [phone]. CALL REF # [id number]

## 2020-04-28 ENCOUNTER — Encounter: Payer: Self-pay | Admitting: Podiatry

## 2020-04-30 ENCOUNTER — Encounter: Payer: Self-pay | Admitting: Podiatry

## 2020-05-01 ENCOUNTER — Encounter: Payer: Self-pay | Admitting: Podiatry

## 2020-05-01 NOTE — Progress Notes (Signed)
Subjective:  Patient ID: Erica Rodriguez, female    DOB: 02/09/1986,  MRN: [id number]  Chief Complaint  Patient presents with  . Foot Pain    Bilateral foot pain Right is worse than left     34 y.o. female presents with the above complaint.  Patient presents with a follow-up of bilateral plantar fasciitis.  The right side is still a lot more painful than left side.  Patient states that she has not gotten any relief.  She has tried the injection boots orthotics but none of that has helped.  At this time patient would like to discuss surgical treatment options another conservative treatment option with has helped.   Review of Systems: Negative except as noted in the HPI. Denies N/V/F/Ch.  Past Medical History:  Diagnosis Date  . Depression   . Headache    migraines  . Mild preeclampsia     Current Outpatient Medications:  .  aluminum chloride (DRYSOL) 20 % external solution, Apply topically at bedtime., Disp: 35 mL, Rfl: 0 .  clindamycin-benzoyl peroxide (BENZACLIN) gel, Apply topically 2 (two) times daily., Disp: 25 g, Rfl: 0 .  cyanocobalamin (,VITAMIN B-12,) 1000 MCG/ML injection, Inject 1 mL (1,000 mcg total) into the muscle every 30 (thirty) days., Disp: 3 mL, Rfl: 1 .  cyclobenzaprine (FLEXERIL) 5 MG tablet, TAKE 1 TABLET BY MOUTH 3 TIMES A DAY AS NEEDED FOR MUSCLE SPASMS, Disp: 60 tablet, Rfl: 2 .  fluticasone (FLONASE) 50 MCG/ACT nasal spray, Place 2 sprays into both nostrils daily., Disp: 16 g, Rfl: 6 .  ketorolac (TORADOL) 60 MG/2ML SOLN injection, INJECT (60MG ) INTRAMUSCULARLY AT ONSET OF MIGRAINE. MAY REPEAT IN 6 HOURS. MAX TWICE A DAY AND 4 DAYS PER MONTH., Disp: 10 mL, Rfl: 4 .  methylPREDNISolone (MEDROL DOSEPAK) 4 MG TBPK tablet, Take all pills in the morning with food for 6 days., Disp: 21 tablet, Rfl: 1 .  minocycline (MINOCIN) 100 MG capsule, minocycline 100 mg capsule  TAKE 1 CAPSULE (100 MG TOTAL) BY MOUTH DAILY., Disp: , Rfl:  .  MIRENA, 52 MG, 20 MCG/24HR IUD, ,  Disp: , Rfl:  .  nitrofurantoin, macrocrystal-monohydrate, (MACROBID) 100 MG capsule, nitrofurantoin monohydrate/macrocrystals 100 mg capsule  TAKE 1 CAPSULE (100 MG TOTAL) BY MOUTH 2 (TWO) TIMES DAILY., Disp: , Rfl:  .  OnabotulinumtoxinA (BOTOX IM), Inject into the muscle. 155units IM to head and neck every 3 months for migraines., Disp: , Rfl:  .  ondansetron (ZOFRAN ODT) 4 MG disintegrating tablet, Take 1-2 tablets (4-8 mg total) by mouth every 8 (eight) hours as needed for nausea or vomiting., Disp: 30 tablet, Rfl: 11 .  propranolol ER (INDERAL LA) 60 MG 24 hr capsule, Take 1 capsule (60 mg total) by mouth daily., Disp: 90 capsule, Rfl: 3 .  Rimegepant Sulfate (NURTEC) 75 MG TBDP, Take 75 mg by mouth once as needed for up to 1 dose. Try to take as soon as possible for migraine. Once daily., Disp: 8 tablet, Rfl: 11 .  sulfamethoxazole-trimethoprim (BACTRIM DS) 800-160 MG tablet, sulfamethoxazole 800 mg-trimethoprim 160 mg tablet  TAKE 1 TABLET BY MOUTH 2 (TWO) TIMES DAILY., Disp: , Rfl:  .  tizanidine (ZANAFLEX) 6 MG capsule, tizanidine 6 mg capsule, Disp: , Rfl:  .  topiramate (TOPAMAX) 100 MG tablet, Take 1 tablet (100 mg total) by mouth 2 (two) times daily., Disp: 180 tablet, Rfl: 4 .  Ubrogepant (UBRELVY) 50 MG TABS, Take 50-100 mg by mouth every 2 (two) hours as needed. Max  200mg  a day, Disp: 10 tablet, Rfl: 6 .  zolpidem (AMBIEN) 10 MG tablet, Take 1 tablet (10 mg total) by mouth at bedtime as needed for sleep., Disp: 30 tablet, Rfl: 5  Current Facility-Administered Medications:  .  cyanocobalamin ((VITAMIN B-12)) injection 1,000 mcg, 1,000 mcg, Intramuscular, Q30 days, , [name], FNP, 1,000 mcg at 05/26/19 1035  Social History   Tobacco Use  Smoking Status Never Smoker  Smokeless Tobacco Never Used    No Known Allergies Objective:  There were no vitals filed for this visit. There is no height or weight on file to calculate BMI. Constitutional Well developed. Well  nourished.  Vascular Dorsalis pedis pulses palpable bilaterally. Posterior tibial pulses palpable bilaterally. Capillary refill normal to all digits.  No cyanosis or clubbing noted. Pedal hair growth normal.  Neurologic Normal speech. Oriented to person, place, and time. Epicritic sensation to light touch grossly present bilaterally.  Dermatologic Nails well groomed and normal in appearance. No open wounds. No skin lesions.  Orthopedic: Normal joint ROM without pain or crepitus bilaterally. No visible deformities. Tender to palpation at the calcaneal tuber bilaterally. No pain with calcaneal squeeze bilaterally. Ankle ROM full range of motion bilaterally. Silfverskiold Test: negative bilaterally.   Radiographs: Taken and reviewed. No acute fractures or dislocations. No evidence of stress fracture.  Plantar heel spur absent. Posterior heel spur absent.   Assessment:   No diagnosis found. Plan:  Patient was evaluated and treated and all questions answered.  Plantar Fasciitis, bilaterally right greater than left -Patient has not gotten any relief.  Clinically patient is deteriorating and her pain has not gotten any better with any conservative treatment options including cam boot immobilization injection bracing.  At this time I believe patient will benefit from surgical interventions.  I discussed with the patient my entire postop protocol as well as my surgical planning in extensive detail.  I reviewed with the patient that she will benefit from right foot endoscopic plantar fasciotomy.  She does not have any history of equinus and therefore will not need gastrocnemius equalizing procedure. -Patient will be weightbearing as tolerated to the right side with a cam boot.  Patient states understanding -Informed surgical risk consent was reviewed and read aloud to the patient.  I reviewed the films.  I have discussed my findings with the patient in great detail.  I have discussed all risks  including but not limited to infection, stiffness, scarring, limp, disability, deformity, damage to blood vessels and nerves, numbness, poor healing, need for braces, arthritis, chronic pain, amputation, death.  All benefits and realistic expectations discussed in great detail.  I have made no promises as to the outcome.  I have provided realistic expectations.  I have offered the patient a 2nd opinion, which they have declined and assured me they preferred to proceed despite the risks -A total of 33 minutes was spent in direct patient care as well as pre and post patient encounter activities.  This includes documentation as well as reviewing patient chart for labs, imaging, past medical, surgical, social, and family history as documented in the EMR.  I have reviewed medication allergies as documented in EMR.  I discussed the etiology of condition and treatment options from conservative to surgical care.  All risks and benefit of the treatment course was discussed in detail.  All questions were answered and return appointment was discussed.  Since the visit completed in an ambulatory/outpatient setting, the patient and/or parent/guardian has been advised to contact the providers office  for worsening condition and seek medical treatment and/or call 911 if the patient deems either is necessary.   No follow-ups on file.

## 2020-05-07 ENCOUNTER — Encounter: Payer: Self-pay | Admitting: Podiatry

## 2020-05-08 ENCOUNTER — Other Ambulatory Visit: Payer: Self-pay | Admitting: Podiatry

## 2020-05-08 MED ORDER — KETOROLAC TROMETHAMINE 10 MG PO TABS: 10.0000 mg | ORAL_TABLET | Freq: Four times a day (QID) | ORAL | 0 refills | Status: DC | PRN

## 2020-05-08 MED FILL — KETOROLAC 10 MG TABLET: 10 | 5 days supply | Qty: 20 | Fill #0

## 2020-05-15 DIAGNOSIS — G5601 Carpal tunnel syndrome, right upper limb: Secondary | ICD-10-CM | POA: Diagnosis not present

## 2020-05-16 MED FILL — PROPRANOLOL HCL ER 60 MG CP: 60 | 30 days supply | Qty: 30 | Fill #3

## 2020-05-21 ENCOUNTER — Other Ambulatory Visit: Payer: Self-pay | Admitting: Podiatry

## 2020-05-21 DIAGNOSIS — M25571 Pain in right ankle and joints of right foot: Secondary | ICD-10-CM | POA: Diagnosis not present

## 2020-05-21 DIAGNOSIS — M722 Plantar fascial fibromatosis: Secondary | ICD-10-CM | POA: Diagnosis not present

## 2020-05-21 MED ORDER — OXYCODONE-ACETAMINOPHEN 10-325 MG PO TABS
1.0000 | ORAL_TABLET | ORAL | 0 refills | Status: DC | PRN
Start: 2020-05-21 — End: 2020-07-13

## 2020-05-21 MED ORDER — IBUPROFEN 800 MG PO TABS: 800.0000 mg | ORAL_TABLET | Freq: Four times a day (QID) | ORAL | 1 refills | Status: DC | PRN

## 2020-05-21 MED ORDER — OXYCODONE-ACETAMINOPHEN 10-325 MG PO TABS: 1.0000 | ORAL_TABLET | ORAL | 0 refills | Status: DC | PRN

## 2020-05-21 MED FILL — IBUPROFEN 800 MG TABS: 800 | 15 days supply | Qty: 60 | Fill #0

## 2020-05-21 MED FILL — OXYCODONE-APAP 10-325: 10-325 | 5 days supply | Qty: 30 | Fill #0

## 2020-05-24 ENCOUNTER — Other Ambulatory Visit: Payer: Self-pay | Admitting: Podiatry

## 2020-05-24 ENCOUNTER — Other Ambulatory Visit: Payer: Self-pay | Admitting: Nurse Practitioner

## 2020-05-24 ENCOUNTER — Encounter: Payer: Self-pay | Admitting: Podiatry

## 2020-05-24 DIAGNOSIS — E538 Deficiency of other specified B group vitamins: Secondary | ICD-10-CM

## 2020-05-24 MED FILL — ONDANSETRON ODT 4 MG TABLET: 4 | 5 days supply | Qty: 30 | Fill #1

## 2020-05-24 MED FILL — UBRELVY 50 MG TABS: 50 | 30 days supply | Qty: 10 | Fill #1

## 2020-05-24 MED FILL — NURTEC 75 MG TBDP: 75 | 30 days supply | Qty: 8 | Fill #1

## 2020-05-24 MED FILL — KETOROLAC TROMETHAMINE 60 M: 60 | 5 days supply | Qty: 10 | Fill #4

## 2020-05-24 MED FILL — TOPIRAMATE 100 MG TABLET: 100 | 90 days supply | Qty: 180 | Fill #1

## 2020-05-24 MED FILL — CYANOCOBALAMIN 1,000 MCG/ML: 1000 | 90 days supply | Qty: 3 | Fill #0

## 2020-05-24 MED FILL — ZOLPIDEM TARTRATE 10 MG TAB: 10 | 30 days supply | Qty: 30 | Fill #4

## 2020-05-29 DIAGNOSIS — M25631 Stiffness of right wrist, not elsewhere classified: Secondary | ICD-10-CM | POA: Diagnosis not present

## 2020-05-29 DIAGNOSIS — Z4789 Encounter for other orthopedic aftercare: Secondary | ICD-10-CM | POA: Diagnosis not present

## 2020-05-29 DIAGNOSIS — G5602 Carpal tunnel syndrome, left upper limb: Secondary | ICD-10-CM | POA: Diagnosis not present

## 2020-05-29 DIAGNOSIS — G5601 Carpal tunnel syndrome, right upper limb: Secondary | ICD-10-CM | POA: Diagnosis not present

## 2020-05-30 ENCOUNTER — Telehealth: Payer: 59 | Admitting: Nurse Practitioner

## 2020-05-30 ENCOUNTER — Encounter: Payer: Self-pay | Admitting: Podiatry

## 2020-05-30 ENCOUNTER — Other Ambulatory Visit: Payer: Self-pay

## 2020-05-30 ENCOUNTER — Ambulatory Visit (INDEPENDENT_AMBULATORY_CARE_PROVIDER_SITE_OTHER): Payer: 59 | Admitting: Podiatry

## 2020-05-30 DIAGNOSIS — B373 Candidiasis of vulva and vagina: Secondary | ICD-10-CM

## 2020-05-30 DIAGNOSIS — Z9889 Other specified postprocedural states: Secondary | ICD-10-CM

## 2020-05-30 DIAGNOSIS — M722 Plantar fascial fibromatosis: Secondary | ICD-10-CM

## 2020-05-30 DIAGNOSIS — B3731 Acute candidiasis of vulva and vagina: Secondary | ICD-10-CM

## 2020-05-30 MED ORDER — FLUCONAZOLE 150 MG PO TABS: 150.0000 mg | ORAL_TABLET | Freq: Once | ORAL | 0 refills | Status: AC

## 2020-05-30 NOTE — Progress Notes (Signed)
Subjective:  Patient ID: Erica Rodriguez, female    DOB: Mar 20, 1986,  MRN: [id number]  Chief Complaint  Patient presents with  . Routine Post Op    Pt stated that she is doing okay she is still having some pain mainly on the outside of her foot she thinks it is from the boot rubbing the side of her foot. She has no concerns.     DOS: 05/21/2020 Procedure: Right EPF  34 y.o. female returns for post-op check.  Patient states she is doing well.  She has occasional pain especially on the sides of the foot.  The boot has been a little bit hard to walk with.  She denies any other acute complaints.  She has been ambulating with the boot on.  She has been go back to work as well.  Review of Systems: Negative except as noted in the HPI. Denies N/V/F/Ch.  Past Medical History:  Diagnosis Date  . Depression   . Headache    migraines  . Mild preeclampsia     Current Outpatient Medications:  .  aluminum chloride (DRYSOL) 20 % external solution, Apply topically at bedtime., Disp: 35 mL, Rfl: 0 .  cephALEXin (KEFLEX) 500 MG capsule, cephalexin 500 mg capsule, Disp: , Rfl:  .  clindamycin-benzoyl peroxide (BENZACLIN) gel, Apply topically 2 (two) times daily., Disp: 25 g, Rfl: 0 .  cyanocobalamin (,VITAMIN B-12,) 1000 MCG/ML injection, INJECT 1 ML (1,000 MCG TOTAL) INTO THE MUSCLE EVERY 30 DAYS., Disp: 3 mL, Rfl: 1 .  cyclobenzaprine (FLEXERIL) 5 MG tablet, TAKE 1 TABLET BY MOUTH 3 TIMES A DAY AS NEEDED FOR MUSCLE SPASMS, Disp: 60 tablet, Rfl: 2 .  fluconazole (DIFLUCAN) 150 MG tablet, Take 1 tablet (150 mg total) by mouth once for 1 dose., Disp: 1 tablet, Rfl: 0 .  fluticasone (FLONASE) 50 MCG/ACT nasal spray, Place 2 sprays into both nostrils daily., Disp: 16 g, Rfl: 6 .  HYDROcodone-acetaminophen (NORCO/VICODIN) 5-325 MG tablet, hydrocodone 5 mg-acetaminophen 325 mg tablet, Disp: , Rfl:  .  ibuprofen (ADVIL) 800 MG tablet, Take 1 tablet (800 mg total) by mouth every 6 (six) hours as needed., Disp:  60 tablet, Rfl: 1 .  ibuprofen (ADVIL) 800 MG tablet, Take 1 tablet (800 mg total) by mouth every 6 (six) hours as needed., Disp: 60 tablet, Rfl: 1 .  ketorolac (TORADOL) 10 MG tablet, Take 1 tablet (10 mg total) by mouth every 6 (six) hours as needed., Disp: 20 tablet, Rfl: 0 .  ketorolac (TORADOL) 60 MG/2ML SOLN injection, INJECT (60MG ) INTRAMUSCULARLY AT ONSET OF MIGRAINE. MAY REPEAT IN 6 HOURS. MAX TWICE A DAY AND 4 DAYS PER MONTH., Disp: 10 mL, Rfl: 4 .  meloxicam (MOBIC) 7.5 MG tablet, TAKE 1 TAB BY MOUTH DAILY, Disp: 30 tablet, Rfl: 1 .  methylPREDNISolone (MEDROL DOSEPAK) 4 MG TBPK tablet, Take all pills in the morning with food for 6 days., Disp: 21 tablet, Rfl: 1 .  minocycline (MINOCIN) 100 MG capsule, minocycline 100 mg capsule  TAKE 1 CAPSULE (100 MG TOTAL) BY MOUTH DAILY., Disp: , Rfl:  .  MIRENA, 52 MG, 20 MCG/24HR IUD, , Disp: , Rfl:  .  nitrofurantoin, macrocrystal-monohydrate, (MACROBID) 100 MG capsule, nitrofurantoin monohydrate/macrocrystals 100 mg capsule  TAKE 1 CAPSULE (100 MG TOTAL) BY MOUTH 2 (TWO) TIMES DAILY., Disp: , Rfl:  .  OnabotulinumtoxinA (BOTOX IM), Inject into the muscle. 155units IM to head and neck every 3 months for migraines., Disp: , Rfl:  .  ondansetron (ZOFRAN  ODT) 4 MG disintegrating tablet, Take 1-2 tablets (4-8 mg total) by mouth every 8 (eight) hours as needed for nausea or vomiting., Disp: 30 tablet, Rfl: 11 .  ondansetron (ZOFRAN) 4 MG tablet, Zofran 4 mg tablet, Disp: , Rfl:  .  oxyCODONE-acetaminophen (PERCOCET) 10-325 MG tablet, Take 1 tablet by mouth every 4 (four) hours as needed for pain., Disp: 30 tablet, Rfl: 0 .  oxyCODONE-acetaminophen (PERCOCET) 10-325 MG tablet, Take 1 tablet by mouth every 4 (four) hours as needed for pain., Disp: 30 tablet, Rfl: 0 .  propranolol ER (INDERAL LA) 60 MG 24 hr capsule, Take 1 capsule (60 mg total) by mouth daily., Disp: 90 capsule, Rfl: 3 .  Rimegepant Sulfate (NURTEC) 75 MG TBDP, Take 75 mg by mouth once  as needed for up to 1 dose. Try to take as soon as possible for migraine. Once daily., Disp: 8 tablet, Rfl: 11 .  sulfamethoxazole-trimethoprim (BACTRIM DS) 800-160 MG tablet, sulfamethoxazole 800 mg-trimethoprim 160 mg tablet  TAKE 1 TABLET BY MOUTH 2 (TWO) TIMES DAILY., Disp: , Rfl:  .  tizanidine (ZANAFLEX) 6 MG capsule, tizanidine 6 mg capsule, Disp: , Rfl:  .  topiramate (TOPAMAX) 100 MG tablet, Take 1 tablet (100 mg total) by mouth 2 (two) times daily., Disp: 180 tablet, Rfl: 4 .  Ubrogepant (UBRELVY) 50 MG TABS, Take 50-100 mg by mouth every 2 (two) hours as needed. Max 200mg  a day, Disp: 10 tablet, Rfl: 6 .  zolpidem (AMBIEN) 10 MG tablet, Take 1 tablet (10 mg total) by mouth at bedtime as needed for sleep., Disp: 30 tablet, Rfl: 5  Current Facility-Administered Medications:  .  cyanocobalamin ((VITAMIN B-12)) injection 1,000 mcg, 1,000 mcg, Intramuscular, Q30 days, , [name], FNP, 1,000 mcg at 05/26/19 1035  Social History   Tobacco Use  Smoking Status Never Smoker  Smokeless Tobacco Never Used    No Known Allergies Objective:  There were no vitals filed for this visit. There is no height or weight on file to calculate BMI. Constitutional Well developed. Well nourished.  Vascular Foot warm and well perfused. Capillary refill normal to all digits.   Neurologic Normal speech. Oriented to person, place, and time. Epicritic sensation to light touch grossly present bilaterally.  Dermatologic Skin healing well without signs of infection. Skin edges well coapted without signs of infection.  Orthopedic: Tenderness to palpation noted about the surgical site.   Radiographs: None Assessment:   1. Plantar fasciitis of right foot   2. Status post foot surgery    Plan:  Patient was evaluated and treated and all questions answered.  S/p foot surgery right -Progressing as expected post-operatively. -XR: None -WB Status: Weightbearing as tolerated in cam boot -Sutures:  Intact.  No signs of dehiscence noted.  No clinical signs of infection noted -Medications: None -Foot redressed.  No follow-ups on file.

## 2020-05-30 NOTE — Progress Notes (Signed)

## 2020-06-13 ENCOUNTER — Encounter: Payer: Self-pay | Admitting: Podiatry

## 2020-06-13 ENCOUNTER — Ambulatory Visit (INDEPENDENT_AMBULATORY_CARE_PROVIDER_SITE_OTHER): Payer: 59 | Admitting: Podiatry

## 2020-06-13 ENCOUNTER — Other Ambulatory Visit: Payer: Self-pay

## 2020-06-13 DIAGNOSIS — M722 Plantar fascial fibromatosis: Secondary | ICD-10-CM

## 2020-06-13 DIAGNOSIS — Z9889 Other specified postprocedural states: Secondary | ICD-10-CM

## 2020-06-13 MED FILL — PROPRANOLOL HCL ER 60 MG CP: 60 | 90 days supply | Qty: 90 | Fill #0

## 2020-06-15 ENCOUNTER — Encounter: Payer: Self-pay | Admitting: Podiatry

## 2020-06-15 NOTE — Progress Notes (Signed)
Subjective:  Patient ID: Erica Rodriguez, female    DOB: 1985/08/08,  MRN: [id number]  Chief Complaint  Patient presents with  . Routine Post Op    PT stated that she has been having alot of pain and at night she has foot cramps and it feels like someone is pulling at her toes.    DOS: 05/21/2020 Procedure: Right EPF  34 y.o. female returns for post-op check.  Patient states she is doing well.  She has occasional pain especially on the sides of the foot.  The boot has been a little bit hard to walk with.  She denies any other acute complaints.  She has been ambulating with the boot on.  She has been go back to work as well.  Review of Systems: Negative except as noted in the HPI. Denies N/V/F/Ch.  Past Medical History:  Diagnosis Date  . Depression   . Headache    migraines  . Mild preeclampsia     Current Outpatient Medications:  .  aluminum chloride (DRYSOL) 20 % external solution, Apply topically at bedtime., Disp: 35 mL, Rfl: 0 .  cephALEXin (KEFLEX) 500 MG capsule, cephalexin 500 mg capsule, Disp: , Rfl:  .  clindamycin-benzoyl peroxide (BENZACLIN) gel, Apply topically 2 (two) times daily., Disp: 25 g, Rfl: 0 .  cyanocobalamin (,VITAMIN B-12,) 1000 MCG/ML injection, INJECT 1 ML (1,000 MCG TOTAL) INTO THE MUSCLE EVERY 30 DAYS., Disp: 3 mL, Rfl: 1 .  cyclobenzaprine (FLEXERIL) 5 MG tablet, TAKE 1 TABLET BY MOUTH 3 TIMES A DAY AS NEEDED FOR MUSCLE SPASMS, Disp: 60 tablet, Rfl: 2 .  fluconazole (DIFLUCAN) 150 MG tablet, fluconazole 150 mg tablet, Disp: , Rfl:  .  fluticasone (FLONASE) 50 MCG/ACT nasal spray, Place 2 sprays into both nostrils daily., Disp: 16 g, Rfl: 6 .  HYDROcodone-acetaminophen (NORCO/VICODIN) 5-325 MG tablet, hydrocodone 5 mg-acetaminophen 325 mg tablet, Disp: , Rfl:  .  ibuprofen (ADVIL) 800 MG tablet, Take 1 tablet (800 mg total) by mouth every 6 (six) hours as needed., Disp: 60 tablet, Rfl: 1 .  ibuprofen (ADVIL) 800 MG tablet, Take 1 tablet (800 mg total) by  mouth every 6 (six) hours as needed., Disp: 60 tablet, Rfl: 1 .  ketorolac (TORADOL) 10 MG tablet, Take 1 tablet (10 mg total) by mouth every 6 (six) hours as needed., Disp: 20 tablet, Rfl: 0 .  ketorolac (TORADOL) 60 MG/2ML SOLN injection, INJECT (60MG ) INTRAMUSCULARLY AT ONSET OF MIGRAINE. MAY REPEAT IN 6 HOURS. MAX TWICE A DAY AND 4 DAYS PER MONTH., Disp: 10 mL, Rfl: 4 .  meloxicam (MOBIC) 7.5 MG tablet, TAKE 1 TAB BY MOUTH DAILY, Disp: 30 tablet, Rfl: 1 .  methylPREDNISolone (MEDROL DOSEPAK) 4 MG TBPK tablet, Take all pills in the morning with food for 6 days., Disp: 21 tablet, Rfl: 1 .  minocycline (MINOCIN) 100 MG capsule, minocycline 100 mg capsule  TAKE 1 CAPSULE (100 MG TOTAL) BY MOUTH DAILY., Disp: , Rfl:  .  MIRENA, 52 MG, 20 MCG/24HR IUD, , Disp: , Rfl:  .  nitrofurantoin, macrocrystal-monohydrate, (MACROBID) 100 MG capsule, nitrofurantoin monohydrate/macrocrystals 100 mg capsule  TAKE 1 CAPSULE (100 MG TOTAL) BY MOUTH 2 (TWO) TIMES DAILY., Disp: , Rfl:  .  OnabotulinumtoxinA (BOTOX IM), Inject into the muscle. 155units IM to head and neck every 3 months for migraines., Disp: , Rfl:  .  ondansetron (ZOFRAN ODT) 4 MG disintegrating tablet, Take 1-2 tablets (4-8 mg total) by mouth every 8 (eight) hours as needed for  nausea or vomiting., Disp: 30 tablet, Rfl: 11 .  ondansetron (ZOFRAN) 4 MG tablet, Zofran 4 mg tablet, Disp: , Rfl:  .  oxyCODONE-acetaminophen (PERCOCET) 10-325 MG tablet, Take 1 tablet by mouth every 4 (four) hours as needed for pain., Disp: 30 tablet, Rfl: 0 .  oxyCODONE-acetaminophen (PERCOCET) 10-325 MG tablet, Take 1 tablet by mouth every 4 (four) hours as needed for pain., Disp: 30 tablet, Rfl: 0 .  propranolol ER (INDERAL LA) 60 MG 24 hr capsule, Take 1 capsule (60 mg total) by mouth daily., Disp: 90 capsule, Rfl: 3 .  Rimegepant Sulfate (NURTEC) 75 MG TBDP, Take 75 mg by mouth once as needed for up to 1 dose. Try to take as soon as possible for migraine. Once daily.,  Disp: 8 tablet, Rfl: 11 .  sulfamethoxazole-trimethoprim (BACTRIM DS) 800-160 MG tablet, sulfamethoxazole 800 mg-trimethoprim 160 mg tablet  TAKE 1 TABLET BY MOUTH 2 (TWO) TIMES DAILY., Disp: , Rfl:  .  tizanidine (ZANAFLEX) 6 MG capsule, tizanidine 6 mg capsule, Disp: , Rfl:  .  topiramate (TOPAMAX) 100 MG tablet, Take 1 tablet (100 mg total) by mouth 2 (two) times daily., Disp: 180 tablet, Rfl: 4 .  Ubrogepant (UBRELVY) 50 MG TABS, Take 50-100 mg by mouth every 2 (two) hours as needed. Max 200mg  a day, Disp: 10 tablet, Rfl: 6 .  zolpidem (AMBIEN) 10 MG tablet, Take 1 tablet (10 mg total) by mouth at bedtime as needed for sleep., Disp: 30 tablet, Rfl: 5  Current Facility-Administered Medications:  .  cyanocobalamin ((VITAMIN B-12)) injection 1,000 mcg, 1,000 mcg, Intramuscular, Q30 days, , [name], FNP, 1,000 mcg at 05/26/19 1035  Social History   Tobacco Use  Smoking Status Never Smoker  Smokeless Tobacco Never Used    No Known Allergies Objective:  There were no vitals filed for this visit. There is no height or weight on file to calculate BMI. Constitutional Well developed. Well nourished.  Vascular Foot warm and well perfused. Capillary refill normal to all digits.   Neurologic Normal speech. Oriented to person, place, and time. Epicritic sensation to light touch grossly present bilaterally.  Dermatologic Skin healing well without signs of infection. Skin edges well coapted without signs of infection.  Orthopedic: Tenderness to palpation noted about the surgical site.   Radiographs: None Assessment:   1. Plantar fasciitis of right foot   2. Status post foot surgery    Plan:  Patient was evaluated and treated and all questions answered.  S/p foot surgery right -Progressing as expected post-operatively. -XR: None -WB Status: Weightbearing as tolerated in cam boot -Sutures: Removed.  No signs of dehiscence noted.  No clinical signs of infection  noted -Medications: None -Foot redressed. -She will also benefit from starting physical therapy.  Prescription for physical therapy was given to the patient.  Pes planovalgus -I explained to the patient the etiology of pes planovalgus of her extremity options were discussed.  I believe patient will benefit from custom-made orthotics to help control hindfoot motion and support the arch of the foot take the stress away from the plantar fascia.  She will be scheduled see rec for orthotics.  No follow-ups on file.

## 2020-06-18 ENCOUNTER — Telehealth: Payer: Self-pay | Admitting: Podiatry

## 2020-06-18 DIAGNOSIS — M25571 Pain in right ankle and joints of right foot: Secondary | ICD-10-CM | POA: Diagnosis not present

## 2020-06-18 DIAGNOSIS — M722 Plantar fascial fibromatosis: Secondary | ICD-10-CM | POA: Diagnosis not present

## 2020-06-18 DIAGNOSIS — M25671 Stiffness of right ankle, not elsewhere classified: Secondary | ICD-10-CM | POA: Diagnosis not present

## 2020-06-18 DIAGNOSIS — R531 Weakness: Secondary | ICD-10-CM | POA: Diagnosis not present

## 2020-06-18 DIAGNOSIS — R262 Difficulty in walking, not elsewhere classified: Secondary | ICD-10-CM | POA: Diagnosis not present

## 2020-06-18 DIAGNOSIS — M25474 Effusion, right foot: Secondary | ICD-10-CM | POA: Diagnosis not present

## 2020-06-18 NOTE — Addendum Note (Signed)
Addended by: Jacklynn Bue on: 06/18/2020 12:02 PM   Modules accepted: Orders

## 2020-06-18 NOTE — Telephone Encounter (Signed)
[facility] walked across the hall inquiring about this patient. She called to make an appt for 12-13 at 6 pm. They do not have any orders for this patient. Could you please send them over? This is for the [facility] located in the [place] office.

## 2020-06-19 ENCOUNTER — Ambulatory Visit (INDEPENDENT_AMBULATORY_CARE_PROVIDER_SITE_OTHER): Payer: 59 | Admitting: Orthotics

## 2020-06-19 ENCOUNTER — Encounter: Payer: Self-pay | Admitting: Podiatry

## 2020-06-19 ENCOUNTER — Other Ambulatory Visit: Payer: Self-pay

## 2020-06-19 DIAGNOSIS — M722 Plantar fascial fibromatosis: Secondary | ICD-10-CM

## 2020-06-19 DIAGNOSIS — Z01818 Encounter for other preprocedural examination: Secondary | ICD-10-CM

## 2020-06-19 NOTE — Progress Notes (Signed)

## 2020-06-21 MED FILL — ZOLPIDEM TARTRATE 10 MG TAB: 10 | 30 days supply | Qty: 30 | Fill #5

## 2020-06-26 DIAGNOSIS — G5601 Carpal tunnel syndrome, right upper limb: Secondary | ICD-10-CM | POA: Diagnosis not present

## 2020-06-26 DIAGNOSIS — G5602 Carpal tunnel syndrome, left upper limb: Secondary | ICD-10-CM | POA: Diagnosis not present

## 2020-06-26 DIAGNOSIS — Z4789 Encounter for other orthopedic aftercare: Secondary | ICD-10-CM | POA: Diagnosis not present

## 2020-06-29 ENCOUNTER — Telehealth: Payer: 59 | Admitting: Nurse Practitioner

## 2020-06-29 DIAGNOSIS — J029 Acute pharyngitis, unspecified: Secondary | ICD-10-CM | POA: Diagnosis not present

## 2020-06-29 NOTE — Progress Notes (Signed)

## 2020-07-01 ENCOUNTER — Telehealth: Payer: 59 | Admitting: Family

## 2020-07-01 DIAGNOSIS — B9689 Other specified bacterial agents as the cause of diseases classified elsewhere: Secondary | ICD-10-CM | POA: Diagnosis not present

## 2020-07-01 DIAGNOSIS — J019 Acute sinusitis, unspecified: Secondary | ICD-10-CM

## 2020-07-01 MED ORDER — DOXYCYCLINE HYCLATE 100 MG PO TABS: 100.0000 mg | ORAL_TABLET | Freq: Two times a day (BID) | ORAL | 0 refills | Status: DC

## 2020-07-01 NOTE — Progress Notes (Signed)
We are sorry that you are not feeling well.  Here is how we plan to help!  Based on what you have shared with me it looks like you have sinusitis.  Sinusitis is inflammation and infection in the sinus cavities of the head.  Based on your presentation I believe you most likely have Acute Bacterial Sinusitis.  This is an infection caused by bacteria and is treated with antibiotics. I have prescribed Doxycycline 100mg by mouth twice a day for 10 days. You may use an oral decongestant such as Mucinex D or if you have glaucoma or high blood pressure use plain Mucinex. Saline nasal spray help and can safely be used as often as needed for congestion.  If you develop worsening sinus pain, fever or notice severe headache and vision changes, or if symptoms are not better after completion of antibiotic, please schedule an appointment with a health care provider.    Sinus infections are not as easily transmitted as other respiratory infection, however we still recommend that you avoid close contact with loved ones, especially the very young and elderly.  Remember to wash your hands thoroughly throughout the day as this is the number one way to prevent the spread of infection!  Home Care:  Only take medications as instructed by your medical team.  Complete the entire course of an antibiotic.  Do not take these medications with alcohol.  A steam or ultrasonic humidifier can help congestion.  You can place a towel over your head and breathe in the steam from hot water coming from a faucet.  Avoid close contacts especially the very young and the elderly.  Cover your mouth when you cough or sneeze.  Always remember to wash your hands.  Get Help Right Away If:  You develop worsening fever or sinus pain.  You develop a severe head ache or visual changes.  Your symptoms persist after you have completed your treatment plan.  Make sure you  Understand these instructions.  Will watch your  condition.  Will get help right away if you are not doing well or get worse.  Your e-visit answers were reviewed by a board certified advanced clinical practitioner to complete your personal care plan.  Depending on the condition, your plan could have included both over the counter or prescription medications.  If there is a problem please reply  once you have received a response from your provider.  Your safety is important to us.  If you have drug allergies check your prescription carefully.    You can use MyChart to ask questions about today's visit, request a non-urgent call back, or ask for a work or school excuse for 24 hours related to this e-Visit. If it has been greater than 24 hours you will need to follow up with your provider, or enter a new e-Visit to address those concerns.  You will get an e-mail in the next two days asking about your experience.  I hope that your e-visit has been valuable and will speed your recovery. Thank you for using e-visits.  Greater than 5 minutes, yet less than 10 minutes of time have been spent researching, coordinating, and implementing care for this patient today.  Thank you for the details you included in the comment boxes. Those details are very helpful in determining the best course of treatment for you and help us to provide the best care.  

## 2020-07-03 ENCOUNTER — Other Ambulatory Visit: Payer: Self-pay | Admitting: Nurse Practitioner

## 2020-07-03 MED ORDER — NYSTATIN 100000 UNIT/ML MT SUSP: 5.0000 mL | Freq: Four times a day (QID) | OROMUCOSAL | 0 refills | Status: DC

## 2020-07-03 NOTE — Progress Notes (Signed)
nystatin

## 2020-07-06 ENCOUNTER — Telehealth: Payer: 59 | Admitting: Physician Assistant

## 2020-07-06 DIAGNOSIS — N760 Acute vaginitis: Secondary | ICD-10-CM

## 2020-07-06 MED ORDER — FLUCONAZOLE 150 MG PO TABS: 150.0000 mg | ORAL_TABLET | Freq: Once | ORAL | 0 refills | Status: AC

## 2020-07-06 NOTE — Progress Notes (Signed)
We are sorry that you are not feeling well. Here is how we plan to help! Based on what you shared with me it looks like you: May have a yeast vaginosis  Vaginosis is an inflammation of the vagina that can result in discharge, itching and pain. The cause is usually a change in the normal balance of vaginal bacteria or an infection. Vaginosis can also result from reduced estrogen levels after menopause.  The most common causes of vaginosis are:   Bacterial vaginosis which results from an overgrowth of one on several organisms that are normally present in your vagina.   Yeast infections which are caused by a naturally occurring fungus called candida.   Vaginal atrophy (atrophic vaginosis) which results from the thinning of the vagina from reduced estrogen levels after menopause.   Trichomoniasis which is caused by a parasite and is commonly transmitted by sexual intercourse.  Factors that increase your risk of developing vaginosis include: Marland Kitchen Medications, such as antibiotics and steroids . Uncontrolled diabetes . Use of hygiene products such as bubble bath, vaginal spray or vaginal deodorant . Douching . Wearing damp or tight-fitting clothing . Using an intrauterine device (IUD) for birth control . Hormonal changes, such as those associated with pregnancy, birth control pills or menopause . Sexual activity . Having a sexually transmitted infection  Your treatment plan is A single Diflucan (fluconazole) 150mg  tablet once.  I have electronically sent this prescription into the pharmacy that you have chosen. You may repeat in 72 hours if symptoms persist.  Be sure to take all of the medication as directed. Stop taking any medication if you develop a rash, tongue swelling or shortness of breath. Mothers who are breast feeding should consider pumping and discarding their breast milk while on these antibiotics. However, there is no consensus that infant exposure at these doses would be harmful.   Remember that medication creams can weaken latex condoms.   HOME CARE:  Good hygiene may prevent some types of vaginosis from recurring and may relieve some symptoms:  . Avoid baths, hot tubs and whirlpool spas. Rinse soap from your outer genital area after a shower, and dry the area well to prevent irritation. Don't use scented or harsh soaps, such as those with deodorant or antibacterial action. Marland Kitchen Avoid irritants. These include scented tampons and pads. . Wipe from front to back after using the toilet. Doing so avoids spreading fecal bacteria to your vagina.  Other things that may help prevent vaginosis include:  Marland Kitchen Don't douche. Your vagina doesn't require cleansing other than normal bathing. Repetitive douching disrupts the normal organisms that reside in the vagina and can actually increase your risk of vaginal infection. Douching won't clear up a vaginal infection. . Use a latex condom. Both female and female latex condoms may help you avoid infections spread by sexual contact. . Wear cotton underwear. Also wear pantyhose with a cotton crotch. If you feel comfortable without it, skip wearing underwear to bed. Yeast thrives in Marland Kitchen Your symptoms should improve in the next day or two.  GET HELP RIGHT AWAY IF:  . You have pain in your lower abdomen ( pelvic area or over your ovaries) . You develop nausea or vomiting . You develop a fever . Your discharge changes or worsens . You have persistent pain with intercourse . You develop shortness of breath, a rapid pulse, or you faint.  These symptoms could be signs of problems or infections that need to be evaluated by a  medical provider now.  MAKE SURE YOU    Understand these instructions.  Will watch your condition.  Will get help right away if you are not doing well or get worse.  Your e-visit answers were reviewed by a board certified advanced clinical practitioner to complete your personal care plan. Depending  upon the condition, your plan could have included both over the counter or prescription medications. Please review your pharmacy choice to make sure that you have choses a pharmacy that is open for you to pick up any needed prescription, Your safety is important to [place]. If you have drug allergies check your prescription carefully.   You can use MyChart to ask questions about today's visit, request a non-urgent call back, or ask for a work or school excuse for 24 hours related to this e-Visit. If it has been greater than 24 hours you will need to follow up with your provider, or enter a new e-Visit to address those concerns. You will get a MyChart message within the next two days asking about your experience. I hope that your e-visit has been valuable and will speed your recovery.  Greater than 5 minutes, yet less than 10 minutes of time have been spent researching, coordinating, and implementing care for this patient today

## 2020-07-12 ENCOUNTER — Other Ambulatory Visit: Payer: Self-pay | Admitting: Nurse Practitioner

## 2020-07-12 ENCOUNTER — Other Ambulatory Visit: Payer: Self-pay

## 2020-07-12 MED ORDER — NYSTATIN 100000 UNIT/ML MT SUSP: 5.0000 mL | Freq: Four times a day (QID) | OROMUCOSAL | 0 refills | Status: DC

## 2020-07-12 MED FILL — NYSTATIN 100000 UNIT/ML SUS: 100000 | 3 days supply | Qty: 60 | Fill #0

## 2020-07-13 ENCOUNTER — Other Ambulatory Visit: Payer: Self-pay | Admitting: Nurse Practitioner

## 2020-07-13 ENCOUNTER — Encounter: Payer: Self-pay | Admitting: Nurse Practitioner

## 2020-07-13 ENCOUNTER — Ambulatory Visit (INDEPENDENT_AMBULATORY_CARE_PROVIDER_SITE_OTHER): Payer: 59 | Admitting: Podiatry

## 2020-07-13 ENCOUNTER — Other Ambulatory Visit: Payer: Self-pay

## 2020-07-13 ENCOUNTER — Ambulatory Visit: Payer: 59 | Admitting: Nurse Practitioner

## 2020-07-13 VITALS — BP 113/79 | HR 87 | Temp 97.0°F | Resp 20 | Ht 61.0 in | Wt 147.0 lb

## 2020-07-13 DIAGNOSIS — F5101 Primary insomnia: Secondary | ICD-10-CM

## 2020-07-13 DIAGNOSIS — F411 Generalized anxiety disorder: Secondary | ICD-10-CM

## 2020-07-13 DIAGNOSIS — G43711 Chronic migraine without aura, intractable, with status migrainosus: Secondary | ICD-10-CM

## 2020-07-13 DIAGNOSIS — Z9889 Other specified postprocedural states: Secondary | ICD-10-CM

## 2020-07-13 DIAGNOSIS — M2550 Pain in unspecified joint: Secondary | ICD-10-CM

## 2020-07-13 DIAGNOSIS — M722 Plantar fascial fibromatosis: Secondary | ICD-10-CM

## 2020-07-13 MED ORDER — ZOLPIDEM TARTRATE 10 MG PO TABS
10.0000 mg | ORAL_TABLET | Freq: Every evening | ORAL | 5 refills | Status: DC | PRN
Start: 2020-07-13 — End: 2020-07-13

## 2020-07-13 NOTE — Progress Notes (Signed)
Subjective:    Patient ID: Erica Rodriguez, female    DOB: [birth year], 35 y.o.   MRN: [id number]   Chief Complaint: Medical Management of Chronic Issues    HPI:  1. Chronic migraine without aura, with intractable migraine, so stated, with status migrainosus She is currently on topamax, ubrelvy and inderal for migraine prevention. She has also had botox. She has 3-4 a month and takes nurtec and that usually helps.   2. GAD (generalized anxiety disorder) Up some during covid because she works at [employer] staffed. GAD 7 : Generalized Anxiety Score 07/13/2020 07/29/2019  Nervous, Anxious, on Edge 1 0  Control/stop worrying 0 0  Worry too much - different things 0 0  Trouble relaxing 1 0  Restless 0 0  Easily annoyed or irritable 1 1  Afraid - awful might happen 0 0  Total GAD 7 Score 3 1  Anxiety Difficulty Not difficult at all Not difficult at all      3. Primary insomnia Has to take ambien to sleep at night. Does not sleep well without it. Sleeps 4-5 hours.    Outpatient Encounter Medications as of 07/13/2020  Medication Sig  . aluminum chloride (DRYSOL) 20 % external solution Apply topically at bedtime.  . clindamycin-benzoyl peroxide (BENZACLIN) gel Apply topically 2 (two) times daily.  . cyanocobalamin (,VITAMIN B-12,) 1000 MCG/ML injection INJECT 1 ML (1,000 MCG TOTAL) INTO THE MUSCLE EVERY 30 DAYS.  Marland Kitchen cyclobenzaprine (FLEXERIL) 5 MG tablet TAKE 1 TABLET BY MOUTH 3 TIMES A DAY AS NEEDED FOR MUSCLE SPASMS  . fluticasone (FLONASE) 50 MCG/ACT nasal spray Place 2 sprays into both nostrils daily.  Marland Kitchen ibuprofen (ADVIL) 800 MG tablet Take 1 tablet (800 mg total) by mouth every 6 (six) hours as needed.  Marland Kitchen ibuprofen (ADVIL) 800 MG tablet Take 1 tablet (800 mg total) by mouth every 6 (six) hours as needed.  Marland Kitchen ketorolac (TORADOL) 10 MG tablet Take 1 tablet (10 mg total) by mouth every 6 (six) hours as needed.  Marland Kitchen ketorolac (TORADOL) 60 MG/2ML SOLN injection INJECT 2ML (60MG)  INTRAMUSCULARLY AT ONSET OF MIGRAINE. MAY REPEAT IN 6 HOURS. MAX TWICE A DAY AND 4 DAYS PER MONTH.  . meloxicam (MOBIC) 7.5 MG tablet TAKE 1 TAB BY MOUTH DAILY  . methylPREDNISolone (MEDROL DOSEPAK) 4 MG TBPK tablet Take all pills in the morning with food for 6 days.  Marland Kitchen MIRENA, 52 MG, 20 MCG/24HR IUD   . nystatin (MYCOSTATIN) 100000 UNIT/ML suspension Take 5 mLs (500,000 Units total) by mouth 4 (four) times daily.  . OnabotulinumtoxinA (BOTOX IM) Inject into the muscle. 155units IM to head and neck every 3 months for migraines.  . ondansetron (ZOFRAN ODT) 4 MG disintegrating tablet Take 1-2 tablets (4-8 mg total) by mouth every 8 (eight) hours as needed for nausea or vomiting.  . ondansetron (ZOFRAN) 4 MG tablet Zofran 4 mg tablet  . propranolol ER (INDERAL LA) 60 MG 24 hr capsule Take 1 capsule (60 mg total) by mouth daily.  . Rimegepant Sulfate (NURTEC) 75 MG TBDP Take 75 mg by mouth once as needed for up to 1 dose. Try to take as soon as possible for migraine. Once daily.  . tizanidine (ZANAFLEX) 6 MG capsule tizanidine 6 mg capsule  . topiramate (TOPAMAX) 100 MG tablet Take 1 tablet (100 mg total) by mouth 2 (two) times daily.  Marland Kitchen Ubrogepant (UBRELVY) 50 MG TABS Take 50-100 mg by mouth every 2 (two) hours as needed. Max 275m a  day  . zolpidem (AMBIEN) 10 MG tablet Take 1 tablet (10 mg total) by mouth at bedtime as needed for sleep.  . [DISCONTINUED] doxycycline (VIBRA-TABS) 100 MG tablet Take 1 tablet (100 mg total) by mouth 2 (two) times daily.  . [DISCONTINUED] HYDROcodone-acetaminophen (NORCO/VICODIN) 5-325 MG tablet hydrocodone 5 mg-acetaminophen 325 mg tablet  . [DISCONTINUED] oxyCODONE-acetaminophen (PERCOCET) 10-325 MG tablet Take 1 tablet by mouth every 4 (four) hours as needed for pain.  . [DISCONTINUED] oxyCODONE-acetaminophen (PERCOCET) 10-325 MG tablet Take 1 tablet by mouth every 4 (four) hours as needed for pain.   Facility-Administered Encounter Medications as of 07/13/2020   Medication  . cyanocobalamin ((VITAMIN B-12)) injection 1,000 mcg    Past Surgical History:  Procedure Laterality Date  . CESAREAN SECTION    . FOOT SURGERY Bilateral   . WISDOM TOOTH EXTRACTION      Family History  Problem Relation Age of Onset  . Thyroid disease Mother   . Hyperlipidemia Mother   . Arthritis/Rheumatoid Mother   . Diabetes Mother   . Hypertension Father   . Kidney disease Father   . Cirrhosis Father   . Diabetes Maternal Grandmother   . Stroke Maternal Grandmother   . Stroke Maternal Grandfather     New complaints: She had carpal tunnel surgery in November and ortho suggested she be checked for RA 1 x a year. Last check was negative  Social history  Lives with husband and works at [employer]  Controlled substance contract: n/a    Review of Systems  Constitutional: Negative for diaphoresis.  Eyes: Negative for pain.  Respiratory: Negative for shortness of breath.   Cardiovascular: Negative for chest pain, palpitations and leg swelling.  Gastrointestinal: Negative for abdominal pain.  Endocrine: Negative for polydipsia.  Skin: Negative for rash.  Neurological: Negative for dizziness, weakness and headaches.  Hematological: Does not bruise/bleed easily.  All other systems reviewed and are negative.      Objective:   Physical Exam Vitals and nursing note reviewed.  Constitutional:      General: She is not in acute distress.    Appearance: Normal appearance. She is well-developed and well-nourished.  HENT:     Head: Normocephalic.     Nose: Nose normal.     Mouth/Throat:     Mouth: Oropharynx is clear and moist.  Eyes:     Extraocular Movements: EOM normal.     Pupils: Pupils are equal, round, and reactive to light.  Neck:     Vascular: No carotid bruit or JVD.  Cardiovascular:     Rate and Rhythm: Normal rate and regular rhythm.     Pulses: Intact distal pulses.     Heart sounds: Normal heart sounds.  Pulmonary:     Effort:  Pulmonary effort is normal. No respiratory distress.     Breath sounds: Normal breath sounds. No wheezing or rales.  Chest:     Chest wall: No tenderness.  Abdominal:     General: Bowel sounds are normal. There is no distension or abdominal bruit. Aorta is normal.     Palpations: Abdomen is soft. There is no hepatomegaly, splenomegaly, mass or pulsatile mass.     Tenderness: There is no abdominal tenderness.  Musculoskeletal:        General: No edema. Normal range of motion.     Cervical back: Normal range of motion and neck supple.  Lymphadenopathy:     Cervical: No cervical adenopathy.  Skin:    General: Skin is warm and  dry.  Neurological:     Mental Status: She is alert and oriented to person, place, and time.     Deep Tendon Reflexes: Reflexes are normal and symmetric.  Psychiatric:        Mood and Affect: Mood and affect normal.        Behavior: Behavior normal.        Thought Content: Thought content normal.        Judgment: Judgment normal.   BP 113/79   Pulse 87   Temp (!) 97 F (36.1 C) (Temporal)   Resp 20   Ht _0  (1.549 m)   Wt 147 lb (66.7 kg)   SpO2 100%   BMI 27.78 kg/m          Assessment & Plan:  AZELIA REIGER comes in today with chief complaint of Medical Management of Chronic Issues   Diagnosis and orders addressed:  1. Chronic migraine without aura, with intractable migraine, so stated, with status migrainosus Keep appointments with specialist  2. GAD (generalized anxiety disorder) stress managemenet  3. Primary insomnia Bedtime routine' May add melatonin - zolpidem (AMBIEN) 10 MG tablet; Take 1 tablet (10 mg total) by mouth at bedtime as needed for sleep.  Dispense: 30 tablet; Refill: 5  4. Arthralgia, unspecified joint - Arthritis Panel - CMP14+EGFR - Lipid panel   Labs pending Health Maintenance reviewed Diet and exercise encouraged  Follow up plan: 6 month   [place], FNP

## 2020-07-13 NOTE — Patient Instructions (Signed)
Rheumatoid Arthritis Rheumatoid arthritis (RA) is a long-term (chronic) disease. RA causes inflammation in your joints. Your joints may feel painful, stiff, swollen, and warm. RA may start slowly. It most often affects the small joints of the hands and feet. It can also affect other parts of the body. Symptoms of RA often come and go. There is no cure for RA, but medicines can help your symptoms. What are the causes?  RA is an autoimmune disease. This means that your body's defense system (immune system) attacks healthy parts of your body by mistake. The exact cause of RA is not known. What increases the risk?  Being a woman.  Having a family history of RA or other diseases like RA.  Smoking.  Being overweight.  Being exposed to pollutants or chemicals. What are the signs or symptoms?  Morning stiffness that lasts longer than 30 minutes. This is often the first symptom.  Symptoms start slowly. They are often worse in the morning.  As RA gets worse, symptoms may include: ? Pain, stiffness, swelling, warmth, and tenderness in joints on both sides of your body. ? Loss of energy. ? Not feeling hungry. ? Weight loss. ? A low fever. ? Dry eyes and a dry mouth. ? Firm lumps that grow under your skin. ? Changes in the way your joints look. ? Changes in the way your joints work.  Symptoms vary and they: ? Often come and go. ? Sometimes get worse for a period of time. These are called flares. How is this treated?   Treatment may include: ? Taking good care of yourself. Be sure to rest as needed, eat a healthy diet, and exercise. ? Medicines. These may include:  Pain relievers.  Medicines to help with inflammation.  Disease-modifying antirheumatic drugs (DMARDs).  Medicines called biologic response modifiers. ? Physical therapy and occupational therapy. ? Surgery, if joint damage is very bad. Your doctor will work with you to find the best treatments. Follow these  instructions at home: Activity  Return to your normal activities as told by your doctor. Ask your doctor what activities are safe for you.  Rest when you have a flare.  Exercise as told by your doctor. General instructions  Take over-the-counter and prescription medicines only as told by your doctor.  Keep all follow-up visits as told by your doctor. This is important. Where to find more information  American College of Rheumatology: www.rheumatology.org  Arthritis Foundation: www.arthritis.org Contact a doctor if:  You have a flare.  You have a fever.  You have problems because of your medicines. Get help right away if:  You have chest pain.  You have trouble breathing.  You get a hot, painful joint all of a sudden, and it is worse than your normal joint aches. Summary  RA is a long-term disease.  Symptoms of RA start slowly. They are often worse in the morning.  RA causes inflammation in your joints. This information is not intended to replace advice given to you by your health care provider. Make sure you discuss any questions you have with your health care provider. Document Revised: 02/24/2018 Document Reviewed: 02/24/2018 Elsevier Patient Education  2020 Elsevier Inc.  

## 2020-07-14 LAB — ARTHRITIS PANEL
Basophils Absolute: 0 10*3/uL (ref 0.0–0.2)
Basos: 1 %
EOS (ABSOLUTE): 0.1 10*3/uL (ref 0.0–0.4)
Eos: 2 %
Hematocrit: 39.5 % (ref 34.0–46.6)
Hemoglobin: 13.3 g/dL (ref 11.1–15.9)
Immature Grans (Abs): 0 10*3/uL (ref 0.0–0.1)
Immature Granulocytes: 0 %
Lymphocytes Absolute: 2.3 10*3/uL (ref 0.7–3.1)
Lymphs: 35 %
MCH: 32.5 pg (ref 26.6–33.0)
MCHC: 33.7 g/dL (ref 31.5–35.7)
MCV: 97 fL (ref 79–97)
Monocytes Absolute: 0.5 10*3/uL (ref 0.1–0.9)
Monocytes: 8 %
Neutrophils Absolute: 3.5 10*3/uL (ref 1.4–7.0)
Neutrophils: 54 %
Platelets: 354 10*3/uL (ref 150–450)
RBC: 4.09 x10E6/uL (ref 3.77–5.28)
RDW: 11.6 % — ABNORMAL LOW (ref 11.7–15.4)
Rheumatoid fact SerPl-aCnc: <10 IU/mL (ref <14.0)
Sed Rate: 19 mm/hr (ref 0–32)
Uric Acid: 2.7 mg/dL (ref 2.6–6.2)
WBC: 6.4 10*3/uL (ref 3.4–10.8)

## 2020-07-14 LAB — LIPID PANEL
Chol/HDL Ratio: 4 ratio (ref 0.0–4.4)
Cholesterol, Total: 170 mg/dL (ref 100–199)
HDL: 42 mg/dL (ref >39)
LDL Chol Calc (NIH): 114 mg/dL — ABNORMAL HIGH (ref 0–99)
Triglycerides: 74 mg/dL (ref 0–149)
VLDL Cholesterol Cal: 14 mg/dL (ref 5–40)

## 2020-07-14 LAB — CMP14+EGFR
ALT: 7 IU/L (ref 0–32)
AST: 11 IU/L (ref 0–40)
Albumin/Globulin Ratio: 1.5 (ref 1.2–2.2)
Albumin: 4.2 g/dL (ref 3.8–4.8)
Alkaline Phosphatase: 81 IU/L (ref 44–121)
BUN/Creatinine Ratio: 21 (ref 9–23)
BUN: 14 mg/dL (ref 6–20)
Bilirubin Total: <0.2 mg/dL (ref 0.0–1.2)
CO2: 21 mmol/L (ref 20–29)
Calcium: 8.9 mg/dL (ref 8.7–10.2)
Chloride: 110 mmol/L — ABNORMAL HIGH (ref 96–106)
Creatinine, Ser: 0.66 mg/dL (ref 0.57–1.00)
GFR calc Af Amer: 132 mL/min/{1.73_m2} (ref >59)
GFR calc non Af Amer: 115 mL/min/{1.73_m2} (ref >59)
Globulin, Total: 2.8 g/dL (ref 1.5–4.5)
Glucose: 98 mg/dL (ref 65–99)
Potassium: 4 mmol/L (ref 3.5–5.2)
Sodium: 141 mmol/L (ref 134–144)
Total Protein: 7 g/dL (ref 6.0–8.5)

## 2020-07-17 ENCOUNTER — Encounter: Payer: 59 | Admitting: Orthotics

## 2020-07-17 ENCOUNTER — Encounter: Payer: Self-pay | Admitting: Podiatry

## 2020-07-17 NOTE — Progress Notes (Signed)
Subjective:  Patient ID: Erica Rodriguez, female    DOB: 1986-01-22,  MRN: [id number]  Chief Complaint  Patient presents with  . Routine Post Op    Pt stated that she is doing well she has no major concerns at this time     DOS: 05/21/2020 Procedure: Right EPF  35 y.o. female returns for post-op check.  Patient states she is doing well. She denies any other acute complaint she has gone to regular shoes. She has gone back to work as well. She denies any issues.  Review of Systems: Negative except as noted in the HPI. Denies N/V/F/Ch.  Past Medical History:  Diagnosis Date  . Depression   . Headache    migraines  . Mild preeclampsia     Current Outpatient Medications:  .  aluminum chloride (DRYSOL) 20 % external solution, Apply topically at bedtime., Disp: 35 mL, Rfl: 0 .  clindamycin-benzoyl peroxide (BENZACLIN) gel, Apply topically 2 (two) times daily., Disp: 25 g, Rfl: 0 .  cyanocobalamin (,VITAMIN B-12,) 1000 MCG/ML injection, INJECT 1 ML (1,000 MCG TOTAL) INTO THE MUSCLE EVERY 30 DAYS., Disp: 3 mL, Rfl: 1 .  cyclobenzaprine (FLEXERIL) 5 MG tablet, TAKE 1 TABLET BY MOUTH 3 TIMES A DAY AS NEEDED FOR MUSCLE SPASMS, Disp: 60 tablet, Rfl: 2 .  fluticasone (FLONASE) 50 MCG/ACT nasal spray, Place 2 sprays into both nostrils daily., Disp: 16 g, Rfl: 6 .  ketorolac (TORADOL) 10 MG tablet, Take 1 tablet (10 mg total) by mouth every 6 (six) hours as needed., Disp: 20 tablet, Rfl: 0 .  ketorolac (TORADOL) 60 MG/2ML SOLN injection, INJECT (60MG ) INTRAMUSCULARLY AT ONSET OF MIGRAINE. MAY REPEAT IN 6 HOURS. MAX TWICE A DAY AND 4 DAYS PER MONTH., Disp: 10 mL, Rfl: 4 .  meloxicam (MOBIC) 7.5 MG tablet, TAKE 1 TAB BY MOUTH DAILY, Disp: 30 tablet, Rfl: 1 .  MIRENA, 52 MG, 20 MCG/24HR IUD, , Disp: , Rfl:  .  nystatin (MYCOSTATIN) 100000 UNIT/ML suspension, Take 5 mLs (500,000 Units total) by mouth 4 (four) times daily., Disp: 60 mL, Rfl: 0 .  OnabotulinumtoxinA (BOTOX IM), Inject into the muscle.  155units IM to head and neck every 3 months for migraines., Disp: , Rfl:  .  ondansetron (ZOFRAN ODT) 4 MG disintegrating tablet, Take 1-2 tablets (4-8 mg total) by mouth every 8 (eight) hours as needed for nausea or vomiting., Disp: 30 tablet, Rfl: 11 .  propranolol ER (INDERAL LA) 60 MG 24 hr capsule, Take 1 capsule (60 mg total) by mouth daily., Disp: 90 capsule, Rfl: 3 .  Rimegepant Sulfate (NURTEC) 75 MG TBDP, Take 75 mg by mouth once as needed for up to 1 dose. Try to take as soon as possible for migraine. Once daily., Disp: 8 tablet, Rfl: 11 .  tizanidine (ZANAFLEX) 6 MG capsule, tizanidine 6 mg capsule, Disp: , Rfl:  .  topiramate (TOPAMAX) 100 MG tablet, Take 1 tablet (100 mg total) by mouth 2 (two) times daily., Disp: 180 tablet, Rfl: 4 .  Ubrogepant (UBRELVY) 50 MG TABS, Take 50-100 mg by mouth every 2 (two) hours as needed. Max 200mg  a day, Disp: 10 tablet, Rfl: 6 .  zolpidem (AMBIEN) 10 MG tablet, Take 1 tablet (10 mg total) by mouth at bedtime as needed for sleep., Disp: 30 tablet, Rfl: 5  Current Facility-Administered Medications:  .  cyanocobalamin ((VITAMIN B-12)) injection 1,000 mcg, 1,000 mcg, Intramuscular, Q30 days, , [name], FNP, 1,000 mcg at 05/26/19 1035  Social History  Tobacco Use  Smoking Status Never Smoker  Smokeless Tobacco Never Used    No Known Allergies Objective:  There were no vitals filed for this visit. There is no height or weight on file to calculate BMI. Constitutional Well developed. Well nourished.  Vascular Foot warm and well perfused. Capillary refill normal to all digits.   Neurologic Normal speech. Oriented to person, place, and time. Epicritic sensation to light touch grossly present bilaterally.  Dermatologic  incision has completely very epithelialized. No  Taunt  fascia noted plantar fascia noted.  Orthopedic: No completely epithelialized. Tenderness to palpation noted about the surgical site.   Radiographs:  None Assessment:   No diagnosis found. Plan:  Patient was evaluated and treated and all questions answered.  S/p foot surgery right -Progressing as expected post-operatively. -XR: None -WB Status: Weight-bear as tolerated in regular shoes -Sutures: Removed.  No signs of dehiscence noted.  No clinical signs of infection noted -Medications: None -Foot redressed. -Continue physical use as needed. -Clinically patient's pain has considerably improved given that her pain is resolved at this time patient can return to regular activities without any reservation. If any foot and ankle issues arise in the future come back and see me. She states understanding   Pes planovalgus -I explained to the patient the etiology of pes planovalgus of her extremity options were discussed.  I believe patient will benefit from custom-made orthotics to help control hindfoot motion and support the arch of the foot take the stress away from the plantar fascia.   -Patient has obtained orthotics and functioning well with them. No acute complaints  No follow-ups on file.

## 2020-07-20 ENCOUNTER — Other Ambulatory Visit: Payer: Self-pay

## 2020-07-20 ENCOUNTER — Encounter: Payer: 59 | Admitting: Orthotics

## 2020-07-22 ENCOUNTER — Other Ambulatory Visit: Payer: Self-pay | Admitting: Podiatry

## 2020-07-22 NOTE — Telephone Encounter (Signed)
Please advise 

## 2020-07-23 ENCOUNTER — Encounter: Payer: 59 | Admitting: Orthotics

## 2020-07-24 ENCOUNTER — Telehealth: Payer: 59 | Admitting: Nurse Practitioner

## 2020-07-24 DIAGNOSIS — J029 Acute pharyngitis, unspecified: Secondary | ICD-10-CM

## 2020-07-24 NOTE — Progress Notes (Signed)

## 2020-07-25 ENCOUNTER — Encounter: Payer: Self-pay | Admitting: Family Medicine

## 2020-07-26 ENCOUNTER — Telehealth: Payer: 59 | Admitting: Family

## 2020-07-26 DIAGNOSIS — U071 COVID-19: Secondary | ICD-10-CM

## 2020-07-26 DIAGNOSIS — R059 Cough, unspecified: Secondary | ICD-10-CM

## 2020-07-26 MED ORDER — ALBUTEROL SULFATE HFA 108 (90 BASE) MCG/ACT IN AERS: 2.0000 | INHALATION_SPRAY | Freq: Four times a day (QID) | RESPIRATORY_TRACT | 0 refills | Status: DC | PRN

## 2020-07-26 MED ORDER — BENZONATATE 100 MG PO CAPS: 100.0000 mg | ORAL_CAPSULE | Freq: Three times a day (TID) | ORAL | 0 refills | Status: DC | PRN

## 2020-07-26 NOTE — Progress Notes (Signed)
E-Visit for Corona Virus Screening  We are sorry you are not feeling well. We are here to help!  You have tested positive for COVID-19, meaning that you were infected with the novel coronavirus and could give the virus to others.  It is vitally important that you stay home so you do not spread it to others.      Please continue isolation at home, for at least 10 days since the start of your symptoms and until you have had 24 hours with no fever (without taking a fever reducer) and with improving of symptoms.  If you have no symptoms but tested positive (or all symptoms resolve after 5 days and you have no fever) you can leave your house but continue to wear a mask around others for an additional 5 days. If you have a fever,continue to stay home until you have had 24 hours of no fever. Most cases improve 5-10 days from onset but we have seen a small number of patients who have gotten worse after the 10 days.  Please be sure to watch for worsening symptoms and remain taking the proper precautions.   Go to the nearest hospital ED for assessment if fever/cough/breathlessness are severe or illness seems like a threat to life.    The following symptoms may appear 2-14 days after exposure: . Fever . Cough . Shortness of breath or difficulty breathing . Chills . Repeated shaking with chills . Muscle pain . Headache . Sore throat . New loss of taste or smell . Fatigue . Congestion or runny nose . Nausea or vomiting . Diarrhea  You have been enrolled in [facility] for COVID-19. Daily you will receive a questionnaire within the [facility] website. Our COVID-19 response team will be monitoring your responses daily.  You can use medication such as A prescription cough medication called Tessalon Perles 100 mg. You may take 1-2 capsules every 8 hours as needed for cough and A prescription inhaler called Albuterol MDI 90 mcg /actuation 2 puffs every 4 hours as needed for shortness of breath,  wheezing, cough     You may also take acetaminophen (Tylenol) as needed for fever.  HOME CARE: . Only take medications as instructed by your medical team. . Drink plenty of fluids and get plenty of rest. . A steam or ultrasonic humidifier can help if you have congestion.   GET HELP RIGHT AWAY IF YOU HAVE EMERGENCY WARNING SIGNS.  Call 911 or proceed to your closest emergency facility if: . You develop worsening high fever. . Trouble breathing . Bluish lips or face . Persistent pain or pressure in the chest . New confusion . Inability to wake or stay awake . You cough up blood. . Your symptoms become more severe . Inability to hold down food or fluids  This list is not all possible symptoms. Contact your medical provider for any symptoms that are severe or concerning to you.    Your e-visit answers were reviewed by a board certified advanced clinical practitioner to complete your personal care plan.  Depending on the condition, your plan could have included both over the counter or prescription medications.  If there is a problem please reply once you have received a response from your provider.  Your safety is important to [place].  If you have drug allergies check your prescription carefully.    You can use MyChart to ask questions about today's visit, request a non-urgent call back, or ask for a work or school  excuse for 24 hours related to this e-Visit. If it has been greater than 24 hours you will need to follow up with your provider, or enter a new e-Visit to address those concerns. You will get an e-mail in the next two days asking about your experience.  I hope that your e-visit has been valuable and will speed your recovery. Thank you for using e-visits.  Greater than 5 minutes, yet less than 10 minutes of time have been spent researching, coordinating, and implementing care for this patient.

## 2020-07-30 ENCOUNTER — Ambulatory Visit: Payer: 59 | Admitting: Family Medicine

## 2020-07-30 MED FILL — ZOLPIDEM TARTRATE 10 MG TAB: 10 | 30 days supply | Qty: 30 | Fill #0

## 2020-07-30 MED FILL — CYCLOBENZAPRINE HCL 5 MG TA: 5 | 20 days supply | Qty: 60 | Fill #1

## 2020-08-07 ENCOUNTER — Other Ambulatory Visit: Payer: Self-pay | Admitting: Neurology

## 2020-08-07 ENCOUNTER — Telehealth: Payer: Self-pay | Admitting: *Deleted

## 2020-08-07 DIAGNOSIS — G43711 Chronic migraine without aura, intractable, with status migrainosus: Secondary | ICD-10-CM

## 2020-08-07 MED FILL — NURTEC 75 MG TBDP: 75 | 30 days supply | Qty: 8 | Fill #2

## 2020-08-07 MED FILL — ONDANSETRON ODT 4 MG TABLET: 4 | 5 days supply | Qty: 30 | Fill #2

## 2020-08-07 MED FILL — KETOROLAC TROMETHAMINE 60 M: 60 | 5 days supply | Qty: 10 | Fill #0

## 2020-08-07 NOTE — Telephone Encounter (Signed)
Received approval via fax. I faxed it to the pt's pharmacy. Received a receipt of confirmation.

## 2020-08-07 NOTE — Telephone Encounter (Signed)
Nurtec PA completed on Cover My Meds Key: [id number]. [employer] approved the medication.   The request has been approved. The authorization is effective for a maximum of 12 fills from 08/07/2020 to 08/06/2021, as long as the member is enrolled in their current health plan. This has been approved for a quantity limit of 18.0 with a day supply limit of 30.0. A written notification letter will follow with additional details.

## 2020-08-09 ENCOUNTER — Ambulatory Visit: Payer: 59 | Admitting: Family Medicine

## 2020-08-09 DIAGNOSIS — G43709 Chronic migraine without aura, not intractable, without status migrainosus: Secondary | ICD-10-CM

## 2020-08-09 NOTE — Progress Notes (Signed)
Botox- 100 units x 2 vial Lot: [id number] Expiration: 09/2022 NDC: 0802-2336-12  Bacteriostatic 0.9% Sodium Chloride- 73mL total [id number] Expiration: 08/2021 NDC: 11021-117-35  Dx: [id number]

## 2020-08-09 NOTE — Progress Notes (Signed)
08/09/2020 ALL: She is doing well, today. She does feel headaches have been a little more frequent over the past two weeks but feels that recent illness and being off schedule with Botox has played a role. She continues to have about 10 migraines every month. She is considering increasing propranolol. Will wait to see what happens with this procedure. She continues topiramate and Nurtec/Ubrelvy.     04/23/2020 ALL: She is doing well. Botox is helping. She continues topiramate 100mg  BID and propranolol 60mg  daily. She alternates Nurtec and Ubrelvy. She usually uses Nurtec then will take if headache continues. She has about 10-12 migraines per month.  Stress is contributor.    Consent Form Botulism Toxin Injection For Chronic Migraine    Reviewed orally with patient, additionally signature is on file:  Botulism toxin has been approved by the Federal drug administration for treatment of chronic migraine. Botulism toxin does not cure chronic migraine and it may not be effective in some patients.  The administration of botulism toxin is accomplished by injecting a small amount of toxin into the muscles of the neck and head. Dosage must be titrated for each individual. Any benefits resulting from botulism toxin tend to wear off after 3 months with a repeat injection required if benefit is to be maintained. Injections are usually done every 3-4 months with maximum effect peak achieved by about 2 or 3 weeks. Botulism toxin is expensive and you should be sure of what costs you will incur resulting from the injection.  The side effects of botulism toxin use for chronic migraine may include:   -Transient, and usually mild, facial weakness with facial injections  -Transient, and usually mild, head or neck weakness with head/neck injections  -Reduction or loss of forehead facial animation due to forehead muscle weakness  -Eyelid drooping  -Dry eye  -Pain at the site of injection or bruising at  the site of injection  -Double vision  -Potential unknown long term risks   Contraindications: You should not have Botox if you are pregnant, nursing, allergic to albumin, have an infection, skin condition, or muscle weakness at the site of the injection, or have myasthenia gravis, Lambert-Eaton syndrome, or ALS.  It is also possible that as with any injection, there may be an allergic reaction or no effect from the medication. Reduced effectiveness after repeated injections is sometimes seen and rarely infection at the injection site may occur. All care will be taken to prevent these side effects. If therapy is given over a long time, atrophy and wasting in the muscle injected may occur. Occasionally the patient's become refractory to treatment because they develop antibodies to the toxin. In this event, therapy needs to be modified.  I have read the above information and consent to the administration of botulism toxin.    BOTOX PROCEDURE NOTE FOR MIGRAINE HEADACHE  Contraindications and precautions discussed with patient(above). Aseptic procedure was observed and patient tolerated procedure. Procedure performed by , FNP-C.   The condition has existed for more than 6 months, and pt does not have a diagnosis of ALS, Myasthenia Gravis or Lambert-Eaton Syndrome.  Risks and benefits of injections discussed and pt agrees to proceed with the procedure.  Written consent obtained  These injections are medically necessary. Pt  receives good benefits from these injections. These injections do not cause sedations or hallucinations which the oral therapies may cause.   Description of procedure:  The patient was placed in a sitting position. The standard protocol  was used for Botox as follows, with 5 units of Botox injected at each site:  -Procerus muscle, midline injection  -Corrugator muscle, bilateral injection  -Frontalis muscle, bilateral injection, with 2 sites each side, medial  injection was performed in the upper one third of the frontalis muscle, in the region vertical from the medial inferior edge of the superior orbital rim. The lateral injection was again in the upper one third of the forehead vertically above the lateral limbus of the cornea, 1.5 cm lateral to the medial injection site.  -Temporalis muscle injection, 4 sites, bilaterally. The first injection was 3 cm above the tragus of the ear, second injection site was 1.5 cm to 3 cm up from the first injection site in line with the tragus of the ear. The third injection site was 1.5-3 cm forward between the first 2 injection sites. The fourth injection site was 1.5 cm posterior to the second injection site. 5th site laterally in the temporalis  muscleat the level of the outer canthus.  -Occipitalis muscle injection, 3 sites, bilaterally. The first injection was done one half way between the occipital protuberance and the tip of the mastoid process behind the ear. The second injection site was done lateral and superior to the first, 1 fingerbreadth from the first injection. The third injection site was 1 fingerbreadth superiorly and medially from the first injection site.  -Cervical paraspinal muscle injection, 2 sites, bilaterally. The first injection site was 1 cm from the midline of the cervical spine, 3 cm inferior to the lower border of the occipital protuberance. The second injection site was 1.5 cm superiorly and laterally to the first injection site.  -Trapezius muscle injection was performed at 3 sites, bilaterally. The first injection site was in the upper trapezius muscle halfway between the inflection point of the neck, and the acromion. The second injection site was one half way between the acromion and the first injection site. The third injection was done between the first injection site and the inflection point of the neck.   Will return for repeat injection in 3 months.   A total of 200 units of Botox  was prepared, 155 units of Botox was injected as documented above, any Botox not injected was wasted. The patient tolerated the procedure well, there were no complications of the above procedure.

## 2020-08-10 DIAGNOSIS — R87612 Low grade squamous intraepithelial lesion on cytologic smear of cervix (LGSIL): Secondary | ICD-10-CM | POA: Diagnosis not present

## 2020-08-15 DIAGNOSIS — G5601 Carpal tunnel syndrome, right upper limb: Secondary | ICD-10-CM | POA: Diagnosis not present

## 2020-08-15 DIAGNOSIS — G5602 Carpal tunnel syndrome, left upper limb: Secondary | ICD-10-CM | POA: Diagnosis not present

## 2020-08-16 ENCOUNTER — Other Ambulatory Visit: Payer: Self-pay | Admitting: Family Medicine

## 2020-08-16 ENCOUNTER — Telehealth: Payer: Self-pay

## 2020-08-16 ENCOUNTER — Encounter: Payer: Self-pay | Admitting: Family Medicine

## 2020-08-16 MED ORDER — UBRELVY 100 MG PO TABS: 100.0000 mg | ORAL_TABLET | Freq: Every day | ORAL | 11 refills | Status: DC | PRN

## 2020-08-16 NOTE — Telephone Encounter (Signed)
I have submitted a PA request for Ubrelvy 100mg  on [employer], Key: [id number]. Received instant approval.   The request has been approved. The authorization is effective for a maximum of 12 fills from 08/16/2020 to 08/15/2021, as long as the member is enrolled in their current health plan. This has been approved for a quantity limit of 16.0 with a day supply limit of 30.0. A written notification letter will follow with additional details.  Patient has tried nurtec, botox, ubrelvy 50mg , topamax, propranolol, naratriptan, flexeril, imitrex, amitriptyline, gabapentin, sertraline, emgality, aimovig, ajovy.

## 2020-08-21 MED FILL — UBRELVY 100 MG TABS: 100 | 30 days supply | Qty: 10 | Fill #0

## 2020-08-22 ENCOUNTER — Other Ambulatory Visit: Payer: Self-pay | Admitting: Family Medicine

## 2020-08-22 ENCOUNTER — Encounter: Payer: Self-pay | Admitting: Family Medicine

## 2020-08-22 MED ORDER — PROPRANOLOL HCL ER 80 MG PO CP24: 80.0000 mg | ORAL_CAPSULE | Freq: Every day | ORAL | 1 refills | Status: DC

## 2020-08-22 MED FILL — TOPIRAMATE 100 MG TABLET: 100 | 90 days supply | Qty: 180 | Fill #2

## 2020-08-22 MED FILL — PROPRANOLOL HCL ER 80 MG CP: 80 | 90 days supply | Qty: 90 | Fill #0

## 2020-08-23 ENCOUNTER — Telehealth: Payer: 59 | Admitting: Physician Assistant

## 2020-08-23 DIAGNOSIS — J029 Acute pharyngitis, unspecified: Secondary | ICD-10-CM

## 2020-08-23 NOTE — Progress Notes (Signed)
Hi Erica Rodriguez,   I am sorry you aren't feeling well. Because your sore throat has been present for a while, I would feel more comfortable if you were evaluated in person for this problem. You may need lab tests  Based on what you shared with me, I feel your condition warrants further evaluation and I recommend that you be seen for a face to face office visit.   NOTE: If you entered your credit card information for this eVisit, you will not be charged. You may see a "hold" on your card for the $35 but that hold will drop off and you will not have a charge processed.   If you are having a true medical emergency please call 911.      For an urgent face to face visit, [facility] has five urgent care centers for your convenience:     [facility] at [facility] Directions [phone] [address] [place], [place] [address] . 10 am - 6pm Monday - Friday    [facility]) Get Driving Directions [phone] [address] [place], [place] [address] . 10 am to 8 pm Monday-Friday . 12 pm to 8 pm [facility] Urgent Care at [facility] Get Driving Directions [phone] [place], [place] [address] . 8 am to 8 pm Monday-Friday . 9 am to 6 pm Saturday . 11 am to 6 pm Sunday     [facility] Health Urgent Care at [facility] Get Driving Directions  [phone] [address] [place], [place] [address] . 8 am to 8 pm Monday-Friday . 8 am to 4 pm [facility] at [facility] Directions [phone] [address] [place], [place] [address] . 12 pm to 6 pm Monday-Friday      Your e-visit answers were reviewed by a board certified advanced clinical practitioner to complete your personal care plan.  Thank you for using e-Visits.

## 2020-08-27 MED FILL — ZOLPIDEM TARTRATE 10 MG TAB: 10 | 30 days supply | Qty: 30 | Fill #1

## 2020-08-29 MED FILL — CYANOCOBALAMIN 1,000 MCG/ML: 1000 | 90 days supply | Qty: 3 | Fill #1

## 2020-09-04 DIAGNOSIS — G5603 Carpal tunnel syndrome, bilateral upper limbs: Secondary | ICD-10-CM | POA: Diagnosis not present

## 2020-09-04 DIAGNOSIS — G5621 Lesion of ulnar nerve, right upper limb: Secondary | ICD-10-CM | POA: Diagnosis not present

## 2020-09-10 ENCOUNTER — Encounter: Payer: Self-pay | Admitting: Family Medicine

## 2020-09-10 DIAGNOSIS — Z0289 Encounter for other administrative examinations: Secondary | ICD-10-CM

## 2020-09-13 MED FILL — NURTEC 75 MG TBDP: 75 | 30 days supply | Qty: 8 | Fill #3

## 2020-09-13 MED FILL — KETOROLAC TROMETHAMINE 60 M: 60 | 5 days supply | Qty: 10 | Fill #1

## 2020-09-13 MED FILL — CYCLOBENZAPRINE HCL 5 MG TA: 5 | 20 days supply | Qty: 60 | Fill #2

## 2020-09-14 MED FILL — UBRELVY 100 MG TABS: 100 | 30 days supply | Qty: 10 | Fill #1

## 2020-09-17 ENCOUNTER — Telehealth: Payer: Self-pay | Admitting: *Deleted

## 2020-09-17 NOTE — Telephone Encounter (Signed)
FMLA form completed for chronic migraines. Completed, singed and to MR.

## 2020-09-20 DIAGNOSIS — G5621 Lesion of ulnar nerve, right upper limb: Secondary | ICD-10-CM | POA: Diagnosis not present

## 2020-09-27 ENCOUNTER — Other Ambulatory Visit (HOSPITAL_COMMUNITY): Payer: Self-pay | Admitting: Orthopaedic Surgery

## 2020-09-27 MED FILL — OXYCODONE-APAP 5-325MG: 5-325 | 4 days supply | Qty: 20 | Fill #0

## 2020-09-27 MED FILL — ZOLPIDEM TARTRATE 10 MG TAB: 10 | 30 days supply | Qty: 30 | Fill #2

## 2020-10-05 ENCOUNTER — Ambulatory Visit: Payer: 59 | Admitting: Nurse Practitioner

## 2020-10-05 ENCOUNTER — Encounter: Payer: Self-pay | Admitting: Nurse Practitioner

## 2020-10-05 ENCOUNTER — Other Ambulatory Visit: Payer: Self-pay | Admitting: Nurse Practitioner

## 2020-10-05 ENCOUNTER — Other Ambulatory Visit: Payer: Self-pay

## 2020-10-05 VITALS — BP 109/78 | HR 83 | Temp 98.3°F | Resp 20 | Ht 61.0 in | Wt 143.0 lb

## 2020-10-05 DIAGNOSIS — F419 Anxiety disorder, unspecified: Secondary | ICD-10-CM

## 2020-10-05 MED ORDER — VIIBRYD 20 MG PO TABS
1.0000 | ORAL_TABLET | Freq: Every day | ORAL | 3 refills | Status: DC
Start: 2020-10-05 — End: 2020-10-05

## 2020-10-05 NOTE — Progress Notes (Signed)
Subjective:    Patient ID: Erica Rodriguez, female    DOB: 1985-12-22, 35 y.o.   MRN: [id number]   Chief Complaint: Discuss meds and anxiety   HPI Pt presents today with coo overwhelming stress at home and work. She has been caring for her mom since last October. Caring for her mom and her family has been stressful. Her sleep has been okay with the ambien, but continues to have trouble falling asleep then is up early (around 3am). Stress symptoms began back in October, but recently been getting worse. Migraine occurrence has not increased since her stress increase. She has tried buspar in the past but it caused acne. Her neurologist has given her cymbalta but it made her feel disconnected.    Review of Systems  Constitutional: Negative for fatigue and fever.  Respiratory: Negative for cough, shortness of breath and wheezing.   Cardiovascular: Negative for chest pain and palpitations.  Gastrointestinal: Negative for abdominal pain, constipation and diarrhea.  Genitourinary: Negative for difficulty urinating, dysuria and hematuria.  Neurological: Positive for headaches. Negative for dizziness, syncope and light-headedness.  Psychiatric/Behavioral: The patient is nervous/anxious.    Flowsheet Row Office Visit from 10/05/2020 in [facility]  PHQ-9 Total Score 1     GAD 7 : Generalized Anxiety Score 10/05/2020 07/13/2020 07/29/2019  Nervous, Anxious, on Edge 2 1 0  Control/stop worrying 1 0 0  Worry too much - different things 1 0 0  Trouble relaxing 1 1 0  Restless 0 0 0  Easily annoyed or irritable 1 1 1   Afraid - awful might happen 0 0 0  Total GAD 7 Score 6 3 1   Anxiety Difficulty Not difficult at all Not difficult at all Not difficult at all    Vitals:   10/05/20 1513  BP: 109/78  Pulse: 83  Resp: 20  Temp: 98.3 F (36.8 C)  SpO2: 100%      Objective:   Physical Exam Constitutional:      Appearance: Normal appearance.  Cardiovascular:     Rate and  Rhythm: Normal rate and regular rhythm.     Pulses: Normal pulses.     Heart sounds: Normal heart sounds.  Pulmonary:     Effort: Pulmonary effort is normal.     Breath sounds: Normal breath sounds.  Abdominal:     General: Bowel sounds are normal.     Palpations: Abdomen is soft.  Musculoskeletal:        General: Normal range of motion.     Cervical back: Normal range of motion and neck supple.  Skin:    General: Skin is warm and dry.     Capillary Refill: Capillary refill takes less than 2 seconds.  Neurological:     Mental Status: She is alert and oriented to person, place, and time.  Psychiatric:        Mood and Affect: Mood normal.        Behavior: Behavior normal.       Assessment & Plan:    EVALISE ABRUZZESE comes in today with chief complaint of Discuss meds and anxiety   Diagnosis and orders addressed:  1. Anxiety Try Viibryd sample for the next two weeks. Contact the office in 2 weeks to discuss how well it is helping.   - Vilazodone HCl (VIIBRYD) 20 MG TABS; Take 1 tablet (20 mg total) by mouth daily.  Dispense: 30 tablet; Refill: 3   Labs pending Health Maintenance reviewed Diet and exercise  encouraged  Follow up plan: PRN  Oretha Milch, RN, BSN, FNP-Student [name], FNP

## 2020-10-05 NOTE — Patient Instructions (Signed)
Textbook of family medicine (9th ed., pp. 1062-1073). [place], [place]: [employer].">  Stress, Adult Stress is a normal reaction to life events. Stress is what you feel when life demands more than you are used to, or more than you think you can handle. Some stress can be useful, such as studying for a test or meeting a deadline at work. Stress that occurs too often or for too long can cause problems. It can affect your emotional health and interfere with relationships and normal daily activities. Too much stress can weaken your body's defense system (immune system) and increase your risk for physical illness. If you already have a medical problem, stress can make it worse. What are the causes? All sorts of life events can cause stress. An event that causes stress for one person may not be stressful for another person. Major life events, whether positive or negative, commonly cause stress. Examples include:  Losing a job or starting a new job.  Losing a loved one.  Moving to a new town or home.  Getting married or divorced.  Having a baby.  Getting injured or sick. Less obvious life events can also cause stress, especially if they occur day after day or in combination with each other. Examples include:  Working long hours.  Driving in traffic.  Caring for children.  Being in debt.  Being in a difficult relationship. What are the signs or symptoms? Stress can cause emotional symptoms, including:  Anxiety. This is feeling worried, afraid, on edge, overwhelmed, or out of control.  Anger, including irritation or impatience.  Depression. This is feeling sad, down, helpless, or guilty.  Trouble focusing, remembering, or making decisions. Stress can cause physical symptoms, including:  Aches and pains. These may affect your head, neck, back, stomach, or other areas of your body.  Tight muscles or a clenched jaw.  Low energy.  Trouble sleeping. Stress can cause unhealthy  behaviors, including:  Eating to feel better (overeating) or skipping meals.  Working too much or putting off tasks.  Smoking, drinking alcohol, or using drugs to feel better. How is this diagnosed? Stress is diagnosed through an assessment by your health care provider. He or she may diagnose this condition based on:  Your symptoms and any stressful life events.  Your medical history.  Tests to rule out other causes of your symptoms. Depending on your condition, your health care provider may refer you to a specialist for further evaluation. How is this treated? Stress management techniques are the recommended treatment for stress. Medicine is not typically recommended for the treatment of stress. Techniques to reduce your reaction to stressful life events include:  Stress identification. Monitor yourself for symptoms of stress and identify what causes stress for you. These skills may help you to avoid or prepare for stressful events.  Time management. Set your priorities, keep a calendar of events, and learn to say no. Taking these actions can help you avoid making too many commitments. Techniques for coping with stress include:  Rethinking the problem. Try to think realistically about stressful events rather than ignoring them or overreacting. Try to find the positives in a stressful situation rather than focusing on the negatives.  Exercise. Physical exercise can release both physical and emotional tension. The key is to find a form of exercise that you enjoy and do it regularly.  Relaxation techniques. These relax the body and mind. The key is to find one or more that you enjoy and use the techniques regularly. Examples include: ?   Meditation, deep breathing, or progressive relaxation techniques. ? Yoga or tai chi. ? Biofeedback, mindfulness techniques, or journaling. ? Listening to music, being out in nature, or participating in other hobbies.  Practicing a healthy lifestyle.  Eat a balanced diet, drink plenty of water, limit or avoid caffeine, and get plenty of sleep.  Having a strong support network. Spend time with family, friends, or other people you enjoy being around. Express your feelings and talk things over with someone you trust. Counseling or talk therapy with a mental health professional may be helpful if you are having trouble managing stress on your own.   Follow these instructions at home: Lifestyle  Avoid drugs.  Do not use any products that contain nicotine or tobacco, such as cigarettes, e-cigarettes, and chewing tobacco. If you need help quitting, ask your health care provider.  Limit alcohol intake to no more than 1 drink a day for nonpregnant women and 2 drinks a day for men. One drink equals 12 oz of beer, 5 oz of wine, or 1 oz of hard liquor  Do not use alcohol or drugs to relax.  Eat a balanced diet that includes fresh fruits and vegetables, whole grains, lean meats, fish, eggs, and beans, and low-fat dairy. Avoid processed foods and foods high in added fat, sugar, and salt.  Exercise at least 30 minutes on 5 or more days each week.  Get 7-8 hours of sleep each night.   General instructions  Practice stress management techniques as discussed with your health care provider.  Drink enough fluid to keep your urine clear or pale yellow.  Take over-the-counter and prescription medicines only as told by your health care provider.  Keep all follow-up visits as told by your health care provider. This is important.   Contact a health care provider if:  Your symptoms get worse.  You have new symptoms.  You feel overwhelmed by your problems and can no longer manage them on your own. Get help right away if:  You have thoughts of hurting yourself or others. If you ever feel like you may hurt yourself or others, or have thoughts about taking your own life, get help right away. You can go to your nearest emergency department or  call:  Your local emergency services (911 in the [place]  A suicide crisis helpline, such as the National Suicide Prevention Lifeline at 1-800-273-8255. This is open 24 hours a day. Summary  Stress is a normal reaction to life events. It can cause problems if it happens too often or for too long.  Practicing stress management techniques is the best way to treat stress.  Counseling or talk therapy with a mental health professional may be helpful if you are having trouble managing stress on your own. This information is not intended to replace advice given to you by your health care provider. Make sure you discuss any questions you have with your health care provider. Document Revised: 03/09/2020 Document Reviewed: 03/09/2020 Elsevier Patient Education  2021 Elsevier Inc.  

## 2020-10-06 ENCOUNTER — Other Ambulatory Visit (HOSPITAL_COMMUNITY): Payer: Self-pay

## 2020-10-09 DIAGNOSIS — M25621 Stiffness of right elbow, not elsewhere classified: Secondary | ICD-10-CM | POA: Diagnosis not present

## 2020-10-11 ENCOUNTER — Other Ambulatory Visit (HOSPITAL_COMMUNITY): Payer: Self-pay

## 2020-10-11 MED FILL — Rimegepant Sulfate Tab Disint 75 MG: ORAL | 30 days supply | Qty: 8 | Fill #0 | Status: AC

## 2020-10-14 ENCOUNTER — Other Ambulatory Visit: Payer: Self-pay | Admitting: Neurology

## 2020-10-14 DIAGNOSIS — M62838 Other muscle spasm: Secondary | ICD-10-CM

## 2020-10-14 MED FILL — Ubrogepant Tab 100 MG: ORAL | 30 days supply | Qty: 10 | Fill #0 | Status: AC

## 2020-10-15 ENCOUNTER — Other Ambulatory Visit (HOSPITAL_COMMUNITY): Payer: Self-pay

## 2020-10-15 DIAGNOSIS — H5213 Myopia, bilateral: Secondary | ICD-10-CM | POA: Diagnosis not present

## 2020-10-16 ENCOUNTER — Other Ambulatory Visit (HOSPITAL_COMMUNITY): Payer: Self-pay

## 2020-10-16 DIAGNOSIS — M25621 Stiffness of right elbow, not elsewhere classified: Secondary | ICD-10-CM | POA: Diagnosis not present

## 2020-10-16 MED ORDER — CYCLOBENZAPRINE HCL 5 MG PO TABS
5.0000 mg | ORAL_TABLET | Freq: Three times a day (TID) | ORAL | 0 refills | Status: DC | PRN
  Filled 2020-10-16: qty 60, 20d supply, fill #0

## 2020-10-16 MED FILL — Vilazodone HCl Tab 20 MG: ORAL | 30 days supply | Qty: 30 | Fill #0 | Status: AC

## 2020-10-25 ENCOUNTER — Other Ambulatory Visit: Payer: Self-pay

## 2020-10-25 ENCOUNTER — Other Ambulatory Visit (HOSPITAL_COMMUNITY): Payer: Self-pay

## 2020-10-25 ENCOUNTER — Ambulatory Visit: Payer: 59 | Admitting: Podiatry

## 2020-10-25 DIAGNOSIS — M722 Plantar fascial fibromatosis: Secondary | ICD-10-CM | POA: Diagnosis not present

## 2020-10-25 DIAGNOSIS — Q666 Other congenital valgus deformities of feet: Secondary | ICD-10-CM | POA: Diagnosis not present

## 2020-10-25 MED FILL — Zolpidem Tartrate Tab 10 MG: ORAL | 30 days supply | Qty: 30 | Fill #0 | Status: AC

## 2020-10-26 DIAGNOSIS — M25621 Stiffness of right elbow, not elsewhere classified: Secondary | ICD-10-CM | POA: Diagnosis not present

## 2020-10-31 ENCOUNTER — Encounter: Payer: Self-pay | Admitting: Podiatry

## 2020-10-31 NOTE — Progress Notes (Signed)
Subjective:  Patient ID: Erica Rodriguez, female    DOB: 17-Nov-1985,  MRN: [id number]  Chief Complaint  Patient presents with  . Plantar Fasciitis    PT stated that she is still having some pain in her left foot     DOS: 05/21/2020 Procedure: Right EPF  35 y.o. female returns for post-op check.  Patient states she is doing well. She denies any other acute complaint she has gone to regular shoes. She has gone back to work as well. She denies any issues.  Review of Systems: Negative except as noted in the HPI. Denies N/V/F/Ch.  Past Medical History:  Diagnosis Date  . Depression   . Headache    migraines  . Mild preeclampsia     Current Outpatient Medications:  .  albuterol (VENTOLIN HFA) 108 (90 Base) MCG/ACT inhaler, Inhale 2 puffs into the lungs every 6 (six) hours as needed for wheezing or shortness of breath., Disp: 8 g, Rfl: 0 .  aluminum chloride (DRYSOL) 20 % external solution, Apply topically at bedtime., Disp: 35 mL, Rfl: 0 .  clindamycin-benzoyl peroxide (BENZACLIN) gel, Apply topically 2 (two) times daily., Disp: 25 g, Rfl: 0 .  cyanocobalamin (,VITAMIN B-12,) 1000 MCG/ML injection, INJECT 1 ML (1,000 MCG TOTAL) INTO THE MUSCLE EVERY 30 DAYS., Disp: 3 mL, Rfl: 1 .  cyclobenzaprine (FLEXERIL) 5 MG tablet, TAKE 1 TABLET BY MOUTH 3 TIMES A DAY AS NEEDED FOR MUSCLE SPASMS, Disp: 60 tablet, Rfl: 0 .  fluticasone (FLONASE) 50 MCG/ACT nasal spray, Place 2 sprays into both nostrils daily., Disp: 16 g, Rfl: 6 .  ketorolac (TORADOL) 10 MG tablet, TAKE 1 TABLET (10 MG TOTAL) BY MOUTH EVERY 6 HOURS AS NEEDED., Disp: 20 tablet, Rfl: 0 .  ketorolac (TORADOL) 60 MG/2ML SOLN injection, INJECT (60MG ) INTO THE MUSCLE AT ONSET OF MIGRAINE. MAY REPEAT IN 6 HOURS. MAX TWICE A DAY AND 4 DAYS PER MONTH., Disp: 10 mL, Rfl: 1 .  meloxicam (MOBIC) 7.5 MG tablet, TAKE 1 TAB BY MOUTH DAILY, Disp: 30 tablet, Rfl: 1 .  MIRENA, 52 MG, 20 MCG/24HR IUD, , Disp: , Rfl:  .  OnabotulinumtoxinA (BOTOX IM),  Inject into the muscle. 155units IM to head and neck every 3 months for migraines., Disp: , Rfl:  .  oxyCODONE-acetaminophen (PERCOCET/ROXICET) 5-325 MG tablet, TAKE 1-2 TABLETS BY MOUTH EVERY 4-6 HOURS AS NEEDED FOR PAIN. NOT TO EXCEED 6 TABLETS A DAY., Disp: 20 tablet, Rfl: 0 .  propranolol ER (INDERAL LA) 80 MG 24 hr capsule, TAKE 1 CAPSULE (80 MG TOTAL) BY MOUTH DAILY., Disp: 90 capsule, Rfl: 1 .  Rimegepant Sulfate 75 MG TBDP, TAKE 1 TABLET BY MOUTH ONCE AS NEEDED FOR UP TO 1 DOSE. TRY TO TAKE AS SOON AS POSSIBLE FOR MIGRAINE., Disp: 8 tablet, Rfl: 11 .  topiramate (TOPAMAX) 100 MG tablet, TAKE 1 TABLET (100 MG TOTAL) BY MOUTH 2 (TWO) TIMES DAILY., Disp: 180 tablet, Rfl: 4 .  Ubrogepant 100 MG TABS, TAKE 1 TABLET (100 MG) BY MOUTH AT ONSET OF HEADACHE, MAY REPEAT 1 TABLET IN 2 HOURS, NO MORE THAN 2 TABLETS IN 24 HOURS, Disp: 10 tablet, Rfl: 11 .  Vilazodone HCl 20 MG TABS, TAKE 1 TABLET (20 MG TOTAL) BY MOUTH DAILY., Disp: 30 tablet, Rfl: 3 .  zolpidem (AMBIEN) 10 MG tablet, TAKE 1 TABLET (10 MG TOTAL) BY MOUTH AT BEDTIME AS NEEDED FOR SLEEP., Disp: 30 tablet, Rfl: 5  Current Facility-Administered Medications:  .  cyanocobalamin ((VITAMIN B-12)) injection  1,000 mcg, 1,000 mcg, Intramuscular, Q30 days, [name], [name], FNP, 1,000 mcg at 05/26/19 1035  Social History   Tobacco Use  Smoking Status Never Smoker  Smokeless Tobacco Never Used    No Known Allergies Objective:  There were no vitals filed for this visit. There is no height or weight on file to calculate BMI. Constitutional Well developed. Well nourished.  Vascular Foot warm and well perfused. Capillary refill normal to all digits.   Neurologic Normal speech. Oriented to person, place, and time. Epicritic sensation to light touch grossly present bilaterally.  Dermatologic  incision has completely very epithelialized. No  Taunt  fascia noted plantar fascia noted.  Orthopedic: No completely epithelialized. Tenderness to  palpation noted about the surgical site.   Radiographs: None Assessment:   1. Plantar fasciitis of right foot   2. Plantar fasciitis of left foot   3. Pes planovalgus    Plan:  Patient was evaluated and treated and all questions answered. \ Bilateral plantar fasciitis status post right foot EPF 05/27/2020 -I explained the patient etiology of recurrence and continues pain that she is experiencingFrom it.  Given that the left side is much worse than right side I believe patient will benefit from steroid injection to both sides.  Patient agrees with the plan like to proceed with steroid injection. -Continue using plantar fascial brace and orthotics and make shoe gear modification  Procedure: Injection Tendon/Ligament Location: Bilateral plantar fascia at the glabrous junction; medial approach. Skin Prep: alcohol Injectate: 0.5 cc 0.5% marcaine plain, 0.5 cc of 1% Lidocaine, 0.5 cc kenalog 10. Disposition: Patient tolerated procedure well. Injection site dressed with a band-aid.  No follow-ups on file.   Pes planovalgus -I explained to the patient the etiology of pes planovalgus of her extremity options were discussed.  I believe patient will benefit from custom-made orthotics to help control hindfoot motion and support the arch of the foot take the stress away from the plantar fascia.   -Patient has obtained orthotics and functioning well with them. No acute complaints  No follow-ups on file.

## 2020-11-01 ENCOUNTER — Telehealth: Payer: Self-pay

## 2020-11-01 NOTE — Telephone Encounter (Signed)
Pt states that since starting the Viibryd she has been having headaches. She thought that it was her regular headaches that she normally has but this happens daily about 2 hours after she takes her medicine. Please advise

## 2020-11-01 NOTE — Telephone Encounter (Signed)
Wean off nad we will see what we can do when come off of meds

## 2020-11-01 NOTE — Telephone Encounter (Signed)
Patient notified and verbalized understanding. 

## 2020-11-08 ENCOUNTER — Telehealth: Payer: Self-pay

## 2020-11-08 NOTE — Telephone Encounter (Signed)
Patient has weaned herself off the Viibryd as instructed. What should she do now? Is there another medication that she can try? Please advise

## 2020-11-08 NOTE — Telephone Encounter (Signed)
Patient also states that she has tried Zoloft, Lexapro, and Celexa and none have worked. Also does not want med to make her gain weight

## 2020-11-08 NOTE — Telephone Encounter (Signed)
Patient aware and verbalizes understanding.  States that she is interested in the gene sight testing but she would like to know about pricing before scheduling an appointment.  Please advise  Patient aware you are out of the office until Tuesday.

## 2020-11-08 NOTE — Telephone Encounter (Signed)
Needs to see brittany for genetic testing

## 2020-11-09 ENCOUNTER — Ambulatory Visit (INDEPENDENT_AMBULATORY_CARE_PROVIDER_SITE_OTHER): Payer: 59 | Admitting: Family Medicine

## 2020-11-09 ENCOUNTER — Encounter: Payer: Self-pay | Admitting: Family Medicine

## 2020-11-09 ENCOUNTER — Other Ambulatory Visit: Payer: Self-pay

## 2020-11-09 VITALS — Temp 98.2°F

## 2020-11-09 DIAGNOSIS — H579 Unspecified disorder of eye and adnexa: Secondary | ICD-10-CM

## 2020-11-09 DIAGNOSIS — H1032 Unspecified acute conjunctivitis, left eye: Secondary | ICD-10-CM

## 2020-11-09 NOTE — Progress Notes (Signed)
   Subjective:    Patient ID: Erica Rodriguez, female    DOB: Jan 21, 1986, 35 y.o.   MRN: [id number]  HPI Chief Complaint  Patient presents with  . Eye Pain    Eye pain, red, and swelling. Sleeps in contacts and thanks she has a cut in here eye from contacts. Contacts are dry   Here with complaints of left eye redness, irritation and foreign body sensation since this morning after sleeping with her contact lenses in. Vision normal.  States she did remove the entire contact lens.   No fever, chills, headache, dizziness, URI symptoms.    Review of Systems Pertinent positives and negatives in the history of present illness.     Objective:   Physical Exam Constitutional:      Appearance: Normal appearance.  Eyes:     Extraocular Movements: Extraocular movements intact.     Pupils: Pupils are equal, round, and reactive to light.   Pulmonary:     Effort: Pulmonary effort is normal.  Musculoskeletal:     Cervical back: Normal range of motion and neck supple.  Neurological:     Mental Status: She is alert.    Temp 98.2 F (36.8 C) (Oral)        Assessment & Plan:  Acute conjunctivitis of left eye, unspecified acute conjunctivitis type  Sensation of foreign body in eye  No red flag symptoms.  She will avoid wearing her contact lenses until condition resolved Tetracaine used as well as fluorescein for stain.  Woods lamp used to visualize her eye.  Her eye was then irrigated with normal saline Discussed that she does have an area that appears to be a small abrasion and then another area that did not take up any stain but did seem abnormal to her upper iris.  She will use Polytrim, cool compresses and schedule with her eye doctor.  A patch was placed on her eye to allow her to rest.  Discussed keeping her eye well moisturized.

## 2020-11-13 NOTE — Telephone Encounter (Signed)
Patient aware and appointment scheduled.  

## 2020-11-13 NOTE — Telephone Encounter (Signed)
The max out of pocket they will charge is $330; anything over that, they will call the patient for. They do adjust based on income and family size. If a patient has to pay, they will set up a payment plan if needed.

## 2020-11-16 ENCOUNTER — Telehealth: Payer: 59 | Admitting: Orthopedic Surgery

## 2020-11-16 ENCOUNTER — Other Ambulatory Visit (HOSPITAL_COMMUNITY): Payer: Self-pay

## 2020-11-16 ENCOUNTER — Other Ambulatory Visit: Payer: Self-pay

## 2020-11-16 DIAGNOSIS — M2550 Pain in unspecified joint: Secondary | ICD-10-CM

## 2020-11-16 DIAGNOSIS — N760 Acute vaginitis: Secondary | ICD-10-CM | POA: Diagnosis not present

## 2020-11-16 DIAGNOSIS — M25621 Stiffness of right elbow, not elsewhere classified: Secondary | ICD-10-CM | POA: Diagnosis not present

## 2020-11-16 MED ORDER — BUPROPION HCL ER (XL) 150 MG PO TB24
150.0000 mg | ORAL_TABLET | Freq: Every day | ORAL | 1 refills | Status: DC
  Filled 2020-11-16: qty 90, 90d supply, fill #0

## 2020-11-17 MED ORDER — FLUCONAZOLE 150 MG PO TABS: 150.0000 mg | ORAL_TABLET | Freq: Every day | ORAL | 0 refills | Status: AC

## 2020-11-17 NOTE — Progress Notes (Signed)
We are sorry that you are not feeling well. Here is how we plan to help! Based on what you shared with me it looks like you: May have a yeast vaginosis  Vaginosis is an inflammation of the vagina that can result in discharge, itching and pain. The cause is usually a change in the normal balance of vaginal bacteria or an infection. Vaginosis can also result from reduced estrogen levels after menopause.  The most common causes of vaginosis are:   Bacterial vaginosis which results from an overgrowth of one on several organisms that are normally present in your vagina.   Yeast infections which are caused by a naturally occurring fungus called candida.   Vaginal atrophy (atrophic vaginosis) which results from the thinning of the vagina from reduced estrogen levels after menopause.   Trichomoniasis which is caused by a parasite and is commonly transmitted by sexual intercourse.  Factors that increase your risk of developing vaginosis include: Marland Kitchen Medications, such as antibiotics and steroids . Uncontrolled diabetes . Use of hygiene products such as bubble bath, vaginal spray or vaginal deodorant . Douching . Wearing damp or tight-fitting clothing . Using an intrauterine device (IUD) for birth control . Hormonal changes, such as those associated with pregnancy, birth control pills or menopause . Sexual activity . Having a sexually transmitted infection  Your treatment plan is Diflucan (fluconazole) 150mg  tablet once daily for 2 days.  I have electronically sent this prescription into the pharmacy that you have chosen.  Be sure to take all of the medication as directed. Stop taking any medication if you develop a rash, tongue swelling or shortness of breath. Mothers who are breast feeding should consider pumping and discarding their breast milk while on these antibiotics. However, there is no consensus that infant exposure at these doses would be harmful.  Remember that medication creams can  weaken latex condoms.   HOME CARE:  Good hygiene may prevent some types of vaginosis from recurring and may relieve some symptoms:  . Avoid baths, hot tubs and whirlpool spas. Rinse soap from your outer genital area after a shower, and dry the area well to prevent irritation. Don't use scented or harsh soaps, such as those with deodorant or antibacterial action. Marland Kitchen Avoid irritants. These include scented tampons and pads. . Wipe from front to back after using the toilet. Doing so avoids spreading fecal bacteria to your vagina.  Other things that may help prevent vaginosis include:  Marland Kitchen Don't douche. Your vagina doesn't require cleansing other than normal bathing. Repetitive douching disrupts the normal organisms that reside in the vagina and can actually increase your risk of vaginal infection. Douching won't clear up a vaginal infection. . Use a latex condom. Both female and female latex condoms may help you avoid infections spread by sexual contact. . Wear cotton underwear. Also wear pantyhose with a cotton crotch. If you feel comfortable without it, skip wearing underwear to bed. Yeast thrives in Marland Kitchen Your symptoms should improve in the next day or two.  GET HELP RIGHT AWAY IF:  . You have pain in your lower abdomen ( pelvic area or over your ovaries) . You develop nausea or vomiting . You develop a fever . Your discharge changes or worsens . You have persistent pain with intercourse . You develop shortness of breath, a rapid pulse, or you faint.  These symptoms could be signs of problems or infections that need to be evaluated by a medical provider now.  MAKE SURE YOU  Understand these instructions.  Will watch your condition.  Will get help right away if you are not doing well or get worse.  Your e-visit answers were reviewed by a board certified advanced clinical practitioner to complete your personal care plan. Depending upon the condition, your plan could  have included both over the counter or prescription medications. Please review your pharmacy choice to make sure that you have choses a pharmacy that is open for you to pick up any needed prescription, Your safety is important to [place]. If you have drug allergies check your prescription carefully.   You can use MyChart to ask questions about today's visit, request a non-urgent call back, or ask for a work or school excuse for 24 hours related to this e-Visit. If it has been greater than 24 hours you will need to follow up with your provider, or enter a new e-Visit to address those concerns. You will get a MyChart message within the next two days asking about your experience. I hope that your e-visit has been valuable and will speed your recovery.  Greater than 5 minutes, yet less than 10 minutes of time have been spent researching, coordinating and implementing care for this patient today.

## 2020-11-19 ENCOUNTER — Ambulatory Visit: Payer: 59 | Admitting: Family Medicine

## 2020-11-19 ENCOUNTER — Other Ambulatory Visit (HOSPITAL_COMMUNITY): Payer: Self-pay

## 2020-11-19 VITALS — Wt 143.0 lb

## 2020-11-19 DIAGNOSIS — G43711 Chronic migraine without aura, intractable, with status migrainosus: Secondary | ICD-10-CM

## 2020-11-19 DIAGNOSIS — G43111 Migraine with aura, intractable, with status migrainosus: Secondary | ICD-10-CM | POA: Diagnosis not present

## 2020-11-19 MED ORDER — KETOROLAC TROMETHAMINE 60 MG/2ML IM SOLN
60.0000 mg | INTRAMUSCULAR | 1 refills | Status: DC
  Filled 2020-11-19: qty 10, 5d supply, fill #0
  Filled 2020-12-20: qty 10, 5d supply, fill #1

## 2020-11-19 MED FILL — Zolpidem Tartrate Tab 10 MG: ORAL | 30 days supply | Qty: 30 | Fill #1 | Status: AC

## 2020-11-19 MED FILL — Topiramate Tab 100 MG: ORAL | 90 days supply | Qty: 180 | Fill #0 | Status: AC

## 2020-11-19 MED FILL — Propranolol HCl Cap ER 24HR 80 MG: ORAL | 90 days supply | Qty: 90 | Fill #0 | Status: AC

## 2020-11-19 MED FILL — Ubrogepant Tab 100 MG: ORAL | 30 days supply | Qty: 10 | Fill #1 | Status: AC

## 2020-11-19 MED FILL — Rimegepant Sulfate Tab Disint 75 MG: ORAL | 30 days supply | Qty: 8 | Fill #1 | Status: AC

## 2020-11-19 NOTE — Progress Notes (Signed)
11/19/2020 ALL: She feels Botox works very well for about 2.5 months then headaches return. We increased propranolol in 08/2020 to 80mg  ER daily. She reports this has helped with headaches. She continues topiramate 100mg  BID and Ubrelvy 100mg  PRN. She continues to have increased anxiety and feels stress contributes to headaches. Vibryyd worsened headaches. She was recently started on Wellbutrin. Toradol used sparingly for intractable headaches. She has had more tension in right shoulder/upper back.   08/09/2020 ALL: She is doing well, today. She does feel headaches have been a little more frequent over the past two weeks but feels that recent illness and being off schedule with Botox has played a role. She continues to have about 10 migraines every month. She is considering increasing propranolol. Will wait to see what happens with this procedure. She continues topiramate and Nurtec/Ubrelvy.   04/23/2020 ALL: She is doing well. Botox is helping. She continues topiramate 100mg  BID and propranolol 60mg  daily. She alternates Nurtec and Ubrelvy. She usually uses Nurtec then will take if headache continues. She has about 10-12 migraines per month.  Stress is contributor.    Consent Form Botulism Toxin Injection For Chronic Migraine    Reviewed orally with patient, additionally signature is on file:  Botulism toxin has been approved by the Federal drug administration for treatment of chronic migraine. Botulism toxin does not cure chronic migraine and it may not be effective in some patients.  The administration of botulism toxin is accomplished by injecting a small amount of toxin into the muscles of the neck and head. Dosage must be titrated for each individual. Any benefits resulting from botulism toxin tend to wear off after 3 months with a repeat injection required if benefit is to be maintained. Injections are usually done every 3-4 months with maximum effect peak achieved by about 2 or 3  weeks. Botulism toxin is expensive and you should be sure of what costs you will incur resulting from the injection.  The side effects of botulism toxin use for chronic migraine may include:   -Transient, and usually mild, facial weakness with facial injections  -Transient, and usually mild, head or neck weakness with head/neck injections  -Reduction or loss of forehead facial animation due to forehead muscle weakness  -Eyelid drooping  -Dry eye  -Pain at the site of injection or bruising at the site of injection  -Double vision  -Potential unknown long term risks   Contraindications: You should not have Botox if you are pregnant, nursing, allergic to albumin, have an infection, skin condition, or muscle weakness at the site of the injection, or have myasthenia gravis, Lambert-Eaton syndrome, or ALS.  It is also possible that as with any injection, there may be an allergic reaction or no effect from the medication. Reduced effectiveness after repeated injections is sometimes seen and rarely infection at the injection site may occur. All care will be taken to prevent these side effects. If therapy is given over a long time, atrophy and wasting in the muscle injected may occur. Occasionally the patient's become refractory to treatment because they develop antibodies to the toxin. In this event, therapy needs to be modified.  I have read the above information and consent to the administration of botulism toxin.    BOTOX PROCEDURE NOTE FOR MIGRAINE HEADACHE  Contraindications and precautions discussed with patient(above). Aseptic procedure was observed and patient tolerated procedure. Procedure performed by 10/07/2020, FNP-C.   The condition has existed for more than 6 months, and pt  does not have a diagnosis of ALS, Myasthenia Gravis or Lambert-Eaton Syndrome.  Risks and benefits of injections discussed and pt agrees to proceed with the procedure.  Written consent obtained  These injections  are medically necessary. Pt  receives good benefits from these injections. These injections do not cause sedations or hallucinations which the oral therapies may cause.   Description of procedure:  The patient was placed in a sitting position. The standard protocol was used for Botox as follows, with 5 units of Botox injected at each site:  -Procerus muscle, midline injection  -Corrugator muscle, bilateral injection  -Frontalis muscle, bilateral injection, with 2 sites each side, medial injection was performed in the upper one third of the frontalis muscle, in the region vertical from the medial inferior edge of the superior orbital rim. The lateral injection was again in the upper one third of the forehead vertically above the lateral limbus of the cornea, 1.5 cm lateral to the medial injection site.  -Temporalis muscle injection, 4 sites, bilaterally. The first injection was 3 cm above the tragus of the ear, second injection site was 1.5 cm to 3 cm up from the first injection site in line with the tragus of the ear. The third injection site was 1.5-3 cm forward between the first 2 injection sites. The fourth injection site was 1.5 cm posterior to the second injection site. 5th site laterally in the temporalis  muscleat the level of the outer canthus.  -Occipitalis muscle injection, 3 sites, bilaterally. The first injection was done one half way between the occipital protuberance and the tip of the mastoid process behind the ear. The second injection site was done lateral and superior to the first, 1 fingerbreadth from the first injection. The third injection site was 1 fingerbreadth superiorly and medially from the first injection site.  -Cervical paraspinal muscle injection, 2 sites, bilaterally. The first injection site was 1 cm from the midline of the cervical spine, 3 cm inferior to the lower border of the occipital protuberance. The second injection site was 1.5 cm superiorly and laterally to  the first injection site.  -Trapezius muscle injection was performed at 3 sites, bilaterally. The first injection site was in the upper trapezius muscle halfway between the inflection point of the neck, and the acromion. The second injection site was one half way between the acromion and the first injection site. The third injection was done between the first injection site and the inflection point of the neck.  - LS injected with 10 units on right side.    Will return for repeat injection in 3 months.   A total of 200 units of Botox was prepared, 165 units of Botox was injected as documented above, any Botox not injected was wasted. The patient tolerated the procedure well, there were no complications of the above procedure.

## 2020-11-19 NOTE — Progress Notes (Signed)
Botox- 200 units x 1 vial Lot: [id number]  Expiration: 04/2024 NDC: 0600-4599-77  Bacteriostatic 0.9% Sodium Chloride- 42mL total Lot: [id number]  Expiration: 12/05/2021 NDC: 5320-2334-35  Dx: W86.168 B/B

## 2020-11-21 ENCOUNTER — Other Ambulatory Visit (HOSPITAL_COMMUNITY): Payer: Self-pay

## 2020-11-22 ENCOUNTER — Other Ambulatory Visit (HOSPITAL_COMMUNITY): Payer: Self-pay

## 2020-11-23 ENCOUNTER — Ambulatory Visit: Payer: 59 | Admitting: Family Medicine

## 2020-11-27 ENCOUNTER — Other Ambulatory Visit: Payer: Self-pay

## 2020-11-27 ENCOUNTER — Other Ambulatory Visit: Payer: 59

## 2020-11-27 DIAGNOSIS — G7249 Other inflammatory and immune myopathies, not elsewhere classified: Secondary | ICD-10-CM

## 2020-11-29 ENCOUNTER — Encounter: Payer: Self-pay | Admitting: Medical

## 2020-12-01 ENCOUNTER — Telehealth: Payer: 59 | Admitting: Emergency Medicine

## 2020-12-01 DIAGNOSIS — J019 Acute sinusitis, unspecified: Secondary | ICD-10-CM | POA: Diagnosis not present

## 2020-12-01 MED ORDER — AMOXICILLIN-POT CLAVULANATE 875-125 MG PO TABS: 1.0000 | ORAL_TABLET | Freq: Two times a day (BID) | ORAL | 0 refills | Status: DC

## 2020-12-01 NOTE — Progress Notes (Signed)

## 2020-12-03 LAB — CBC WITH DIFFERENTIAL/PLATELET
Basophils Absolute: 0 10*3/uL (ref 0.0–0.2)
Basos: 0 %
EOS (ABSOLUTE): 0.1 10*3/uL (ref 0.0–0.4)
Eos: 2 %
Hematocrit: 42.2 % (ref 34.0–46.6)
Hemoglobin: 14.4 g/dL (ref 11.1–15.9)
Immature Grans (Abs): 0 10*3/uL (ref 0.0–0.1)
Immature Granulocytes: 0 %
Lymphocytes Absolute: 1.8 10*3/uL (ref 0.7–3.1)
Lymphs: 31 %
MCH: 32.4 pg (ref 26.6–33.0)
MCHC: 34.1 g/dL (ref 31.5–35.7)
MCV: 95 fL (ref 79–97)
Monocytes Absolute: 0.4 10*3/uL (ref 0.1–0.9)
Monocytes: 7 %
Neutrophils Absolute: 3.6 10*3/uL (ref 1.4–7.0)
Neutrophils: 60 %
Platelets: 424 10*3/uL (ref 150–450)
RBC: 4.44 x10E6/uL (ref 3.77–5.28)
RDW: 11.3 % — ABNORMAL LOW (ref 11.7–15.4)
WBC: 5.9 10*3/uL (ref 3.4–10.8)

## 2020-12-03 LAB — HLA A,B,C (IR)

## 2020-12-03 LAB — ANGIOTENSIN CONVERTING ENZYME: Angio Convert Enzyme: 40 U/L (ref 14–82)

## 2020-12-03 LAB — CYCLIC CITRUL PEPTIDE ANTIBODY, IGG/IGA: Cyclic Citrullin Peptide Ab: 7 units (ref 0–19)

## 2020-12-03 LAB — CREATININE KINASE MB: CK-MB Index: <1 ng/mL (ref 0.0–5.3)

## 2020-12-07 ENCOUNTER — Ambulatory Visit: Payer: 59 | Admitting: Podiatry

## 2020-12-20 ENCOUNTER — Encounter: Payer: Self-pay | Admitting: Family Medicine

## 2020-12-20 ENCOUNTER — Other Ambulatory Visit: Payer: Self-pay | Admitting: Neurology

## 2020-12-20 ENCOUNTER — Other Ambulatory Visit (HOSPITAL_COMMUNITY): Payer: Self-pay

## 2020-12-20 DIAGNOSIS — M62838 Other muscle spasm: Secondary | ICD-10-CM

## 2020-12-20 MED ORDER — CYCLOBENZAPRINE HCL 5 MG PO TABS
5.0000 mg | ORAL_TABLET | Freq: Three times a day (TID) | ORAL | 0 refills | Status: DC | PRN
  Filled 2020-12-20: qty 60, 20d supply, fill #0

## 2020-12-20 MED ORDER — METHYLPREDNISOLONE 4 MG PO TBPK
ORAL_TABLET | ORAL | 0 refills | Status: DC
Start: 2020-12-20 — End: 2021-01-25
  Filled 2020-12-20: qty 21, 6d supply, fill #0

## 2020-12-20 MED FILL — Ubrogepant Tab 100 MG: ORAL | 30 days supply | Qty: 10 | Fill #2 | Status: AC

## 2020-12-20 MED FILL — Zolpidem Tartrate Tab 10 MG: ORAL | 30 days supply | Qty: 30 | Fill #2 | Status: AC

## 2020-12-20 MED FILL — Rimegepant Sulfate Tab Disint 75 MG: ORAL | 30 days supply | Qty: 8 | Fill #2 | Status: AC

## 2020-12-24 ENCOUNTER — Other Ambulatory Visit (HOSPITAL_COMMUNITY): Payer: Self-pay

## 2020-12-24 DIAGNOSIS — E538 Deficiency of other specified B group vitamins: Secondary | ICD-10-CM

## 2020-12-24 MED ORDER — CYANOCOBALAMIN 1000 MCG/ML IJ SOLN
1000.0000 ug | INTRAMUSCULAR | 1 refills | Status: DC
  Filled 2020-12-24: qty 3, 90d supply, fill #0
  Filled 2021-03-20: qty 3, 90d supply, fill #1

## 2020-12-25 ENCOUNTER — Ambulatory Visit: Payer: 59 | Admitting: Internal Medicine

## 2020-12-25 ENCOUNTER — Other Ambulatory Visit: Payer: Self-pay

## 2020-12-25 ENCOUNTER — Encounter: Payer: Self-pay | Admitting: Internal Medicine

## 2020-12-25 ENCOUNTER — Encounter: Payer: Self-pay | Admitting: Podiatry

## 2020-12-25 VITALS — BP 123/88 | HR 76 | Ht 61.0 in | Wt 142.2 lb

## 2020-12-25 DIAGNOSIS — M79642 Pain in left hand: Secondary | ICD-10-CM

## 2020-12-25 DIAGNOSIS — M722 Plantar fascial fibromatosis: Secondary | ICD-10-CM | POA: Insufficient documentation

## 2020-12-25 DIAGNOSIS — M79641 Pain in right hand: Secondary | ICD-10-CM | POA: Diagnosis not present

## 2020-12-25 DIAGNOSIS — G5621 Lesion of ulnar nerve, right upper limb: Secondary | ICD-10-CM | POA: Insufficient documentation

## 2020-12-25 NOTE — Progress Notes (Signed)
Office Visit Note  Patient: Erica Rodriguez             Date of Birth: September 21, 1985           MRN: [id number]             PCP: Chevis Pretty, FNP Referring: Chevis Pretty, * Visit Date: 12/25/2020 Occupation: Clinic register  Subjective:  New Patient (Initial Visit) (Patient recently had right carpal tunnel release and right ulnar nerve surgery. Patient complains of bilateral hand pain, especially after overuse (typing at work). Patient has been taking Gabapentin and Meloxicam which seems to help some. Patient's mother has history of RA. )   History of Present Illness: Erica Rodriguez is a 35 y.o. female here for evaluation of joint pains especially in her hands. She previously saw orthopedic surgery for bilateral carpal tunnel syndrome last year and has seen podiatry for plantar fasciitis symptoms. She sees neurology for chronic migraines. 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  This commonly feels like an aching type of pain in the joints of her knuckles also gets a burning and stinging type pain in her wrist.  Her carpal tunnel release surgery seem partially helpful though she still has the stinging wrist pain symptoms.  She also had right cubital tunnel surgery for persistent numbness in the fingers of the right hand she feels overall these have partially improved her symptoms.  She notices some benefit with gabapentin and meloxicam and her hand pain.  She does not report any specific joint swelling or erythema though does comment she can only wear some rings on her fingers that previously fit. She does describe hand discoloration looking paler than other areas.  Labs reviewed RF neg CCP neg ACE wnl ESR 19 CBC wnl CMP wnl  Activities of Daily Living:  Patient reports morning stiffness for 0 minutes.   Patient Reports nocturnal pain.  Difficulty  dressing/grooming: Denies Difficulty climbing stairs: Denies Difficulty getting out of chair: Denies Difficulty using hands for taps, buttons, cutlery, and/or writing: Reports  Review of Systems  Constitutional:  Positive for fatigue.  HENT:  Positive for mouth dryness. Negative for mouth sores and nose dryness.   Eyes:  Positive for visual disturbance. Negative for pain, itching and dryness.  Respiratory:  Negative for cough, hemoptysis, shortness of breath and difficulty breathing.   Cardiovascular:  Negative for chest pain, palpitations and swelling in legs/feet.  Gastrointestinal:  Negative for abdominal pain, blood in stool, constipation and diarrhea.  Endocrine: Negative for increased urination.  Genitourinary:  Negative for painful urination.  Musculoskeletal:  Positive for joint pain, joint pain, joint swelling, myalgias and myalgias. Negative for muscle weakness, morning stiffness and muscle tenderness.  Skin:  Positive for color change. Negative for rash and redness.  Allergic/Immunologic: Negative for susceptible to infections.  Neurological:  Positive for numbness and headaches. Negative for dizziness, memory loss and weakness.  Hematological:  Negative for swollen glands.  Psychiatric/Behavioral:  Positive for sleep disturbance. Negative for confusion.    PMFS History:  Patient Active Problem List   Diagnosis Date Noted   Plantar fasciitis 12/25/2020   Ulnar nerve compression, right 12/25/2020   Bilateral carpal tunnel syndrome 02/15/2020   Bilateral hand pain 02/10/2020   Bilateral wrist pain 02/03/2020   Bilateral hand numbness 02/03/2020   Chronic migraine without aura, with intractable migraine, so  stated, with status migrainosus 12/21/2018   Insomnia 10/07/2012   GAD (generalized anxiety disorder) 10/07/2012    Past Medical History:  Diagnosis Date   Depression    Headache    migraines   Mild preeclampsia     Family History  Problem Relation Age of Onset    Thyroid disease Mother    Hyperlipidemia Mother    Arthritis/Rheumatoid Mother    Diabetes Mother    Hypertension Father    Kidney disease Father    Cirrhosis Father    Liver cancer Father    Diabetes Maternal Grandmother    Stroke Maternal Grandmother    Breast cancer Maternal Grandmother    Stroke Maternal Grandfather    Healthy Son    Past Surgical History:  Procedure Laterality Date   CARPAL TUNNEL RELEASE  2021   CESAREAN SECTION     FOOT SURGERY Bilateral    PLANTAR FASCIA RELEASE  2021   ULNAR NERVE REPAIR     WISDOM TOOTH EXTRACTION     Social History   Social History Narrative   Lives at home with husband and son   Drinks 1 soda a day    Immunization History  Administered Date(s) Administered   Influenza-Unspecified 04/10/2015, 04/04/2016, 04/01/2018, 05/03/2019, 04/30/2020   PFIZER(Purple Top)SARS-COV-2 Vaccination 03/05/2020, 04/18/2020   Tdap 05/12/2007     Objective: Vital Signs: BP 123/88 (BP Location: Right Arm, Patient Position: Sitting, Cuff Size: Normal)   Pulse 76   Ht _0  (1.549 m)   Wt 142 lb 3.2 oz (64.5 kg)   BMI 26.87 kg/m    Physical Exam Eyes:     Conjunctiva/sclera: Conjunctivae normal.  Pulmonary:     Effort: Pulmonary effort is normal.     Breath sounds: Normal breath sounds.  Skin:    General: Skin is warm and dry.  Neurological:     General: No focal deficit present.     Mental Status: She is alert.  Psychiatric:        Mood and Affect: Mood normal.     Musculoskeletal Exam:  Neck full ROM no tenderness Shoulders full ROM no tenderness or swelling Elbows full ROM no tenderness or swelling Wrists full ROM no tenderness or swelling Fingers full ROM no tenderness or swelling Knees full ROM no tenderness or swelling Ankles full ROM no tenderness or swelling Firm nodule under anterior border of right calcaneus mild tenderness    Investigation: No additional findings.  Imaging: No results found.  Recent Labs: Lab  Results  Component Value Date   WBC 5.9 11/27/2020   HGB 14.4 11/27/2020   PLT 424 11/27/2020   NA 141 07/13/2020   K 4.0 07/13/2020   CL 110 (H) 07/13/2020   CO2 21 07/13/2020   GLUCOSE 98 07/13/2020   BUN 14 07/13/2020   CREATININE 0.66 07/13/2020   BILITOT <0.2 07/13/2020   ALKPHOS 81 07/13/2020   AST 11 07/13/2020   ALT 7 07/13/2020   PROT 7.0 07/13/2020   ALBUMIN 4.2 07/13/2020   CALCIUM 8.9 07/13/2020   GFRAA 132 07/13/2020    Speciality Comments: No specialty comments available.  Procedures:  No procedures performed Allergies: Patient has no known allergies.   Assessment / Plan:     Visit Diagnoses: Bilateral hand pain  Hand pain without current inflammatory features seen on physical exam and history is fairly nonspecific combined with normal inflammatory markers and rheumatoid arthritis antibodies do not recommend any new RA treatment at this time.  I suspect  this is more of a use related problem with the previous x-rays were reportedly unrevealing for significant arthritis.  I recommended we can always check again if symptoms worsen particularly developing additional hand stiffness swelling or erythema around the joints as signs otherwise no specific follow-up at this time.  Plantar fasciitis  Bilateral plantar fasciitis previous surgery on right side but with normal inflammatory markers and negative serology.  Not a typical site for RA activity in the foot so also seems less likely.  Orders: No orders of the defined types were placed in this encounter.  No orders of the defined types were placed in this encounter.    Follow-Up Instructions: Return if symptoms worsen or fail to improve.   Collier Salina, MD  Note - This record has been created using [employer].  Chart creation errors have been sought, but may not always  have been located. Such creation errors do not reflect on  the standard of medical care.

## 2020-12-28 ENCOUNTER — Other Ambulatory Visit (HOSPITAL_COMMUNITY): Payer: Self-pay

## 2020-12-28 MED ORDER — MELOXICAM 15 MG PO TABS
15.0000 mg | ORAL_TABLET | Freq: Every day | ORAL | 1 refills | Status: DC
  Filled 2020-12-28: qty 30, 30d supply, fill #0

## 2020-12-29 ENCOUNTER — Telehealth: Payer: 59 | Admitting: Emergency Medicine

## 2020-12-29 DIAGNOSIS — N898 Other specified noninflammatory disorders of vagina: Secondary | ICD-10-CM | POA: Diagnosis not present

## 2020-12-29 MED ORDER — FLUCONAZOLE 150 MG PO TABS: ORAL_TABLET | ORAL | 0 refills | Status: DC

## 2020-12-29 NOTE — Progress Notes (Signed)

## 2021-01-01 ENCOUNTER — Other Ambulatory Visit (HOSPITAL_COMMUNITY): Payer: Self-pay

## 2021-01-01 MED ORDER — GABAPENTIN 300 MG PO CAPS
300.0000 mg | ORAL_CAPSULE | Freq: Three times a day (TID) | ORAL | 0 refills | Status: DC
  Filled 2021-01-01: qty 90, 30d supply, fill #0

## 2021-01-16 DIAGNOSIS — M5412 Radiculopathy, cervical region: Secondary | ICD-10-CM | POA: Diagnosis not present

## 2021-01-17 ENCOUNTER — Other Ambulatory Visit: Payer: Self-pay | Admitting: Nurse Practitioner

## 2021-01-17 ENCOUNTER — Other Ambulatory Visit: Payer: Self-pay | Admitting: Family Medicine

## 2021-01-17 ENCOUNTER — Other Ambulatory Visit (HOSPITAL_COMMUNITY): Payer: Self-pay

## 2021-01-17 DIAGNOSIS — F5101 Primary insomnia: Secondary | ICD-10-CM

## 2021-01-17 DIAGNOSIS — G43711 Chronic migraine without aura, intractable, with status migrainosus: Secondary | ICD-10-CM

## 2021-01-17 MED ORDER — KETOROLAC TROMETHAMINE 60 MG/2ML IM SOLN
INTRAMUSCULAR | 0 refills | Status: DC
  Filled 2021-01-17: qty 10, 5d supply, fill #0

## 2021-01-17 MED FILL — Ubrogepant Tab 100 MG: ORAL | 30 days supply | Qty: 10 | Fill #3 | Status: AC

## 2021-01-17 MED FILL — Rimegepant Sulfate Tab Disint 75 MG: ORAL | 30 days supply | Qty: 8 | Fill #3 | Status: AC

## 2021-01-18 ENCOUNTER — Other Ambulatory Visit (HOSPITAL_COMMUNITY): Payer: Self-pay

## 2021-01-23 DIAGNOSIS — M5412 Radiculopathy, cervical region: Secondary | ICD-10-CM | POA: Diagnosis not present

## 2021-01-23 DIAGNOSIS — G5603 Carpal tunnel syndrome, bilateral upper limbs: Secondary | ICD-10-CM | POA: Diagnosis not present

## 2021-01-23 DIAGNOSIS — G5621 Lesion of ulnar nerve, right upper limb: Secondary | ICD-10-CM | POA: Diagnosis not present

## 2021-01-24 ENCOUNTER — Other Ambulatory Visit: Payer: Self-pay

## 2021-01-24 ENCOUNTER — Other Ambulatory Visit: Payer: Self-pay | Admitting: *Deleted

## 2021-01-24 ENCOUNTER — Other Ambulatory Visit: Payer: 59

## 2021-01-24 DIAGNOSIS — R5383 Other fatigue: Secondary | ICD-10-CM

## 2021-01-24 DIAGNOSIS — E538 Deficiency of other specified B group vitamins: Secondary | ICD-10-CM

## 2021-01-25 ENCOUNTER — Encounter: Payer: Self-pay | Admitting: Nurse Practitioner

## 2021-01-25 ENCOUNTER — Other Ambulatory Visit: Payer: Self-pay

## 2021-01-25 ENCOUNTER — Ambulatory Visit (INDEPENDENT_AMBULATORY_CARE_PROVIDER_SITE_OTHER): Payer: 59 | Admitting: Nurse Practitioner

## 2021-01-25 ENCOUNTER — Other Ambulatory Visit (HOSPITAL_COMMUNITY): Payer: Self-pay

## 2021-01-25 VITALS — BP 112/70 | HR 73 | Temp 98.3°F | Resp 20 | Ht 61.0 in | Wt 140.0 lb

## 2021-01-25 DIAGNOSIS — G43711 Chronic migraine without aura, intractable, with status migrainosus: Secondary | ICD-10-CM

## 2021-01-25 DIAGNOSIS — F411 Generalized anxiety disorder: Secondary | ICD-10-CM | POA: Diagnosis not present

## 2021-01-25 DIAGNOSIS — F5101 Primary insomnia: Secondary | ICD-10-CM

## 2021-01-25 LAB — CBC WITH DIFFERENTIAL/PLATELET
Basophils Absolute: 0 10*3/uL (ref 0.0–0.2)
Basos: 0 %
EOS (ABSOLUTE): 0 10*3/uL (ref 0.0–0.4)
Eos: 0 %
Hematocrit: 42.2 % (ref 34.0–46.6)
Hemoglobin: 14 g/dL (ref 11.1–15.9)
Immature Grans (Abs): 0.2 10*3/uL — ABNORMAL HIGH (ref 0.0–0.1)
Immature Granulocytes: 1 %
Lymphocytes Absolute: 1.3 10*3/uL (ref 0.7–3.1)
Lymphs: 7 %
MCH: 32 pg (ref 26.6–33.0)
MCHC: 33.2 g/dL (ref 31.5–35.7)
MCV: 97 fL (ref 79–97)
Monocytes Absolute: 0.7 10*3/uL (ref 0.1–0.9)
Monocytes: 4 %
Neutrophils Absolute: 16.3 10*3/uL — ABNORMAL HIGH (ref 1.4–7.0)
Neutrophils: 88 %
Platelets: 541 10*3/uL — ABNORMAL HIGH (ref 150–450)
RBC: 4.37 x10E6/uL (ref 3.77–5.28)
RDW: 11.8 % (ref 11.7–15.4)
WBC: 18.5 10*3/uL — ABNORMAL HIGH (ref 3.4–10.8)

## 2021-01-25 LAB — CMP14+EGFR
ALT: 9 IU/L (ref 0–32)
AST: 10 IU/L (ref 0–40)
Albumin/Globulin Ratio: 1.7 (ref 1.2–2.2)
Albumin: 4.8 g/dL (ref 3.8–4.8)
Alkaline Phosphatase: 96 IU/L (ref 44–121)
BUN/Creatinine Ratio: 16 (ref 9–23)
BUN: 12 mg/dL (ref 6–20)
Bilirubin Total: 0.3 mg/dL (ref 0.0–1.2)
CO2: 19 mmol/L — ABNORMAL LOW (ref 20–29)
Calcium: 9.7 mg/dL (ref 8.7–10.2)
Chloride: 107 mmol/L — ABNORMAL HIGH (ref 96–106)
Creatinine, Ser: 0.76 mg/dL (ref 0.57–1.00)
Globulin, Total: 2.8 g/dL (ref 1.5–4.5)
Glucose: 110 mg/dL — ABNORMAL HIGH (ref 65–99)
Potassium: 4.2 mmol/L (ref 3.5–5.2)
Sodium: 141 mmol/L (ref 134–144)
Total Protein: 7.6 g/dL (ref 6.0–8.5)
eGFR: 105 mL/min/{1.73_m2} (ref >59)

## 2021-01-25 LAB — AMENORRHEA PROFILE
FSH: 7.6 m[IU]/mL
LH: 11.4 m[IU]/mL
Prolactin: 6 ng/mL (ref 4.8–23.3)

## 2021-01-25 LAB — B12 AND FOLATE PANEL
Folate: 8.6 ng/mL (ref >3.0)
Vitamin B-12: 1376 pg/mL — ABNORMAL HIGH (ref 232–1245)

## 2021-01-25 LAB — LIPID PANEL
Chol/HDL Ratio: 3.3 ratio (ref 0.0–4.4)
Cholesterol, Total: 172 mg/dL (ref 100–199)
HDL: 52 mg/dL (ref >39)
LDL Chol Calc (NIH): 110 mg/dL — ABNORMAL HIGH (ref 0–99)
Triglycerides: 49 mg/dL (ref 0–149)
VLDL Cholesterol Cal: 10 mg/dL (ref 5–40)

## 2021-01-25 MED ORDER — ZOLPIDEM TARTRATE 10 MG PO TABS
10.0000 mg | ORAL_TABLET | Freq: Every evening | ORAL | 4 refills | Status: DC | PRN
  Filled 2021-02-19 – 2021-02-22 (×3): qty 30, 30d supply, fill #0
  Filled 2021-03-20 – 2021-03-22 (×2): qty 30, 30d supply, fill #1
  Filled 2021-04-17 – 2021-04-19 (×3): qty 30, 30d supply, fill #2
  Filled 2021-05-21: qty 30, 30d supply, fill #3

## 2021-01-25 MED ORDER — BUPROPION HCL ER (XL) 150 MG PO TB24
150.0000 mg | ORAL_TABLET | Freq: Every day | ORAL | 1 refills | Status: DC
  Filled 2021-01-25 – 2021-02-25 (×2): qty 90, 90d supply, fill #0
  Filled 2021-05-25: qty 90, 90d supply, fill #1

## 2021-01-25 NOTE — Progress Notes (Signed)
Subjective:    Patient ID: Erica Rodriguez, female    DOB: [birth year], 35 y.o.   MRN: [id number]   Chief Complaint: medical management of chronic issues     HPI:  1. Chronic migraine without aura, with intractable migraine, so stated, with status migrainosus Still has around 5 month. She sees specialist every 4 months. She is in topamax, ubrogepant and botox injections. No better but no worse.  2. GAD (generalized anxiety disorder) Is on wellbutrin which helps some. She is still a little on edge and is snappy, but tolerable. GAD 7 : Generalized Anxiety Score 01/25/2021 10/05/2020 07/13/2020 07/29/2019  Nervous, Anxious, on Edge 2 2 1  0  Control/stop worrying 2 1 0 0  Worry too much - different things 2 1 0 0  Trouble relaxing 1 1 1  0  Restless 0 0 0 0  Easily annoyed or irritable 2 1 1 1   Afraid - awful might happen 0 0 0 0  Total GAD 7 Score 9 6 3 1   Anxiety Difficulty Not difficult at all Not difficult at all Not difficult at all Not difficult at all      3. Primary insomnia Ambien to sleep. Sleeps about 4-5 hours a night even with ambien.    Outpatient Encounter Medications as of 01/25/2021  Medication Sig   aluminum chloride (DRYSOL) 20 % external solution Apply topically at bedtime. (Patient not taking: Reported on 12/25/2020)   amoxicillin-clavulanate (AUGMENTIN) 875-125 MG tablet Take 1 tablet by mouth 2 (two) times daily. One po bid x 7 days (Patient not taking: Reported on 12/25/2020)   buPROPion (WELLBUTRIN XL) 150 MG 24 hr tablet Take 1 tablet (150 mg total) by mouth daily.   clindamycin-benzoyl peroxide (BENZACLIN) gel Apply topically 2 (two) times daily. (Patient taking differently: Apply topically as needed.)   cyanocobalamin (,VITAMIN B-12,) 1000 MCG/ML injection Inject 1 mL (1,000 mcg total) into the muscle every 30 days   cyclobenzaprine (FLEXERIL) 5 MG tablet Take 1 tablet (5 mg total) by mouth 3 (three) times daily as needed for muscle spasms   fluconazole  (DIFLUCAN) 150 MG tablet Take one tab (150mg ) once, may repeat in 3 days if still having symptoms   fluticasone (FLONASE) 50 MCG/ACT nasal spray Place 2 sprays into both nostrils daily.   gabapentin (NEURONTIN) 100 MG capsule Take 100 mg by mouth in the morning, at noon, and at bedtime.   gabapentin (NEURONTIN) 300 MG capsule Take 1 capsule (300 mg total) by mouth 3 (three) times daily.   ketorolac (TORADOL) 60 MG/2ML SOLN injection INJECT (60MG ) INTO THE MUSCLE AT ONSET OF MIGRAINE. MAY REPEAT IN 6 HOURS. MAX TWICE A DAY AND 4 DAYS PER MONTH.   meloxicam (MOBIC) 15 MG tablet Take 15 mg by mouth daily.   meloxicam (MOBIC) 15 MG tablet Take 1 tablet (15 mg total) by mouth daily.   methylPREDNISolone (MEDROL DOSEPAK) 4 MG TBPK tablet Take as directed.   MIRENA, 52 MG, 20 MCG/24HR IUD    OnabotulinumtoxinA (BOTOX IM) Inject into the muscle. 155units IM to head and neck every 3 months for migraines.   propranolol ER (INDERAL LA) 80 MG 24 hr capsule Take 1 capsule (80 mg total) by mouth daily.   Rimegepant Sulfate 75 MG TBDP TAKE 1 TABLET BY MOUTH ONCE AS NEEDED FOR UP TO 1 DOSE. TRY TO TAKE AS SOON AS POSSIBLE FOR MIGRAINE.   topiramate (TOPAMAX) 100 MG tablet Take 1 tablet (100 mg total) by mouth 2 (two)  times daily.   Ubrogepant 100 MG TABS TAKE 1 TABLET (100 MG) BY MOUTH AT ONSET OF HEADACHE, MAY REPEAT 1 TABLET IN 2 HOURS, NO MORE THAN 2 TABLETS IN 24 HOURS   zolpidem (AMBIEN) 10 MG tablet TAKE 1 TABLET (10 MG TOTAL) BY MOUTH AT BEDTIME AS NEEDED FOR SLEEP.   Facility-Administered Encounter Medications as of 01/25/2021  Medication   cyanocobalamin ((VITAMIN B-12)) injection 1,000 mcg    Past Surgical History:  Procedure Laterality Date   CARPAL TUNNEL RELEASE  2021   CESAREAN SECTION     FOOT SURGERY Bilateral    PLANTAR FASCIA RELEASE  2021   ULNAR NERVE REPAIR     WISDOM TOOTH EXTRACTION      Family History  Problem Relation Age of Onset   Thyroid disease Mother     Hyperlipidemia Mother    Arthritis/Rheumatoid Mother    Diabetes Mother    Hypertension Father    Kidney disease Father    Cirrhosis Father    Liver cancer Father    Diabetes Maternal Grandmother    Stroke Maternal Grandmother    Breast cancer Maternal Grandmother    Stroke Maternal Grandfather    Healthy Son     New complaints: none  Social history: Lives with her mom  Controlled substance contract: n/a     Review of Systems  Constitutional:  Negative for diaphoresis.  Eyes:  Negative for pain.  Respiratory:  Negative for shortness of breath.   Cardiovascular:  Negative for chest pain, palpitations and leg swelling.  Gastrointestinal:  Negative for abdominal pain.  Endocrine: Negative for polydipsia.  Skin:  Negative for rash.  Neurological:  Negative for dizziness, weakness and headaches.  Hematological:  Does not bruise/bleed easily.  All other systems reviewed and are negative.     Objective:   Physical Exam Vitals and nursing note reviewed.  Constitutional:      General: She is not in acute distress.    Appearance: Normal appearance. She is well-developed.  HENT:     Head: Normocephalic.     Right Ear: Tympanic membrane normal.     Left Ear: Tympanic membrane normal.     Nose: Nose normal.     Mouth/Throat:     Mouth: Mucous membranes are moist.  Eyes:     Pupils: Pupils are equal, round, and reactive to light.  Neck:     Vascular: No carotid bruit or JVD.  Cardiovascular:     Rate and Rhythm: Normal rate and regular rhythm.     Heart sounds: Normal heart sounds.  Pulmonary:     Effort: Pulmonary effort is normal. No respiratory distress.     Breath sounds: Normal breath sounds. No wheezing or rales.  Chest:     Chest wall: No tenderness.  Abdominal:     General: Bowel sounds are normal. There is no distension or abdominal bruit.     Palpations: Abdomen is soft. There is no hepatomegaly, splenomegaly, mass or pulsatile mass.     Tenderness: There  is no abdominal tenderness.  Musculoskeletal:        General: Normal range of motion.     Cervical back: Normal range of motion and neck supple.  Lymphadenopathy:     Cervical: No cervical adenopathy.  Skin:    General: Skin is warm and dry.  Neurological:     Mental Status: She is alert and oriented to person, place, and time.     Deep Tendon Reflexes: Reflexes are normal  and symmetric.  Psychiatric:        Behavior: Behavior normal.        Thought Content: Thought content normal.        Judgment: Judgment normal.    BP 112/70   Pulse 73   Temp 98.3 F (36.8 C) (Temporal)   Resp 20   Ht 5\' 1"  (1.549 m)   Wt 140 lb (63.5 kg)   SpO2 100%   BMI 26.45 kg/m        Assessment & Plan:  Erica Rodriguez comes in today with chief complaint of Medical Management of Chronic Issues   Diagnosis and orders addressed:  1. Chronic migraine without aura, with intractable migraine, so stated, with status migrainosus Keep follow up with neurologist  2. GAD (generalized anxiety disorder) Stress management - buPROPion (WELLBUTRIN XL) 150 MG 24 hr tablet; Take 1 tablet (150 mg total) by mouth daily.  Dispense: 90 tablet; Refill: 1  3. Primary insomnia Bedtime routine - zolpidem (AMBIEN) 10 MG tablet; TAKE 1 TABLET (10 MG TOTAL) BY MOUTH AT BEDTIME AS NEEDED FOR SLEEP.  Dispense: 30 tablet; Refill: 5   Labs pending Health Maintenance reviewed Diet and exercise encouraged  Follow up plan: 6 months   [name] [name], FNP

## 2021-01-25 NOTE — Patient Instructions (Signed)
Textbook of family medicine (9th ed., pp. 1062-1073). [place], [place]: [employer].">  Stress, Adult Stress is a normal reaction to life events. Stress is what you feel when life demands more than you are used to, or more than you think you can handle. Some stress can be useful, such as studying for a test or meeting a deadline at work. Stress that occurs too often or for too long can cause problems. It can affect your emotional health and interfere with relationships and normal daily activities. Too much stress can weaken your body's defense system (immune system) and increase your risk for physical illness. If you already have a medicalproblem, stress can make it worse. What are the causes? All sorts of life events can cause stress. An event that causes stress for one person may not be stressful for another person. Major life events, whether positive or negative, commonly cause stress. Examples include: Losing a job or starting a new job. Losing a loved one. Moving to a new town or home. Getting married or divorced. Having a baby. Getting injured or sick. Less obvious life events can also cause stress, especially if they occur day after day or in combination with each other. Examples include: Working long hours. Driving in traffic. Caring for children. Being in debt. Being in a difficult relationship. What are the signs or symptoms? Stress can cause emotional symptoms, including: Anxiety. This is feeling worried, afraid, on edge, overwhelmed, or out of control. Anger, including irritation or impatience. Depression. This is feeling sad, down, helpless, or guilty. Trouble focusing, remembering, or making decisions. Stress can cause physical symptoms, including: Aches and pains. These may affect your head, neck, back, stomach, or other areas of your body. Tight muscles or a clenched jaw. Low energy. Trouble sleeping. Stress can cause unhealthy behaviors, including: Eating to feel better  (overeating) or skipping meals. Working too much or putting off tasks. Smoking, drinking alcohol, or using drugs to feel better. How is this diagnosed? Stress is diagnosed through an assessment by your health care provider. He or she may diagnose this condition based on: Your symptoms and any stressful life events. Your medical history. Tests to rule out other causes of your symptoms. Depending on your condition, your health care provider may refer you to aspecialist for further evaluation. How is this treated?  Stress management techniques are the recommended treatment for stress. Medicineis not typically recommended for the treatment of stress. Techniques to reduce your reaction to stressful life events include: Stress identification. Monitor yourself for symptoms of stress and identify what causes stress for you. These skills may help you to avoid or prepare for stressful events. Time management. Set your priorities, keep a calendar of events, and learn to say no. Taking these actions can help you avoid making too many commitments. Techniques for coping with stress include: Rethinking the problem. Try to think realistically about stressful events rather than ignoring them or overreacting. Try to find the positives in a stressful situation rather than focusing on the negatives. Exercise. Physical exercise can release both physical and emotional tension. The key is to find a form of exercise that you enjoy and do it regularly. Relaxation techniques. These relax the body and mind. The key is to find one or more that you enjoy and use the techniques regularly. Examples include: Meditation, deep breathing, or progressive relaxation techniques. Yoga or tai chi. Biofeedback, mindfulness techniques, or journaling. Listening to music, being out in nature, or participating in other hobbies. Practicing a healthy lifestyle.   Eat a balanced diet, drink plenty of water, limit or avoid caffeine, and get  plenty of sleep. Having a strong support network. Spend time with family, friends, or other people you enjoy being around. Express your feelings and talk things over with someone you trust. Counseling or talk therapy with a mental health professional may be helpful if you are havingtrouble managing stress on your own. Follow these instructions at home: Lifestyle  Avoid drugs. Do not use any products that contain nicotine or tobacco, such as cigarettes, e-cigarettes, and chewing tobacco. If you need help quitting, ask your health care provider. Limit alcohol intake to no more than 1 drink a day for nonpregnant women and 2 drinks a day for men. One drink equals 12 oz of beer, 5 oz of wine, or 1 oz of hard liquor Do not use alcohol or drugs to relax. Eat a balanced diet that includes fresh fruits and vegetables, whole grains, lean meats, fish, eggs, and beans, and low-fat dairy. Avoid processed foods and foods high in added fat, sugar, and salt. Exercise at least 30 minutes on 5 or more days each week. Get 7-8 hours of sleep each night.  General instructions  Practice stress management techniques as discussed with your health care provider. Drink enough fluid to keep your urine clear or pale yellow. Take over-the-counter and prescription medicines only as told by your health care provider. Keep all follow-up visits as told by your health care provider. This is important.  Contact a health care provider if: Your symptoms get worse. You have new symptoms. You feel overwhelmed by your problems and can no longer manage them on your own. Get help right away if: You have thoughts of hurting yourself or others. If you ever feel like you may hurt yourself or others, or have thoughts about taking your own life, get help right away. You can go to your nearest emergency department or call: Your local emergency services (911 in the [place] A suicide crisis helpline, such as the [facility] at 4253100524. This is open 24 hours a day. Summary Stress is a normal reaction to life events. It can cause problems if it happens too often or for too long. Practicing stress management techniques is the best way to treat stress. Counseling or talk therapy with a mental health professional may be helpful if you are having trouble managing stress on your own. This information is not intended to replace advice given to you by your health care provider. Make sure you discuss any questions you have with your healthcare provider. Document Revised: 03/09/2020 Document Reviewed: 03/09/2020 Elsevier Patient Education  2022 [employer].

## 2021-01-28 ENCOUNTER — Telehealth: Payer: Self-pay | Admitting: Nurse Practitioner

## 2021-01-28 NOTE — Telephone Encounter (Signed)
Patient notified and verbalized understanding. 

## 2021-01-28 NOTE — Telephone Encounter (Signed)
WBC are elevated- probably something viral- just need to repeat Hormone panel is normal - not near menopause LDL up slightly but are ok- watch fats in diet Kidney and liver function stable

## 2021-01-29 ENCOUNTER — Other Ambulatory Visit (HOSPITAL_COMMUNITY): Payer: Self-pay

## 2021-01-29 MED ORDER — GABAPENTIN 300 MG PO CAPS
300.0000 mg | ORAL_CAPSULE | Freq: Three times a day (TID) | ORAL | 0 refills | Status: DC
  Filled 2021-01-29: qty 90, 30d supply, fill #0

## 2021-02-06 ENCOUNTER — Other Ambulatory Visit: Payer: Self-pay

## 2021-02-06 ENCOUNTER — Ambulatory Visit (INDEPENDENT_AMBULATORY_CARE_PROVIDER_SITE_OTHER): Payer: Self-pay

## 2021-02-06 DIAGNOSIS — B351 Tinea unguium: Secondary | ICD-10-CM

## 2021-02-06 DIAGNOSIS — M722 Plantar fascial fibromatosis: Secondary | ICD-10-CM

## 2021-02-06 NOTE — Progress Notes (Signed)
Patient presents for the 1st EPAT treatment today with complaint of bilateral  heel pain . Diagnosed with plantar fasciitis by Dr. Allena Katz. This has been ongoing for several months. The patient has tried ice, stretching, NSAIDS and supportive shoe gear with no long term relief.   Most of the pain is located heel .  ESWT administered and tolerated well.Treatment settings initiated at:   Energy: 15  Ended treatment session today with 3000 shocks at the following settings:   Energy: 15  Frequency: 6.0  Joules: 14.72   Reviewed post EPAT instructions. Advised to avoid ice and NSAIDs throughout the treatment process and to utilize boot or supportive shoes for at least the next 3 days.  Follow up for 2nd treatment in 1 week.

## 2021-02-06 NOTE — Patient Instructions (Signed)

## 2021-02-06 NOTE — Progress Notes (Addendum)
Patient presents for the 1st EPAT treatment today with complaint of bilateral heel pain . Diagnosed with plantar fasciitis by Dr. Allena Katz. This has been ongoing for several months. The patient has tried ice, stretching, NSAIDS and supportive shoe gear with no long term relief.   Most of the pain is located center of both heels .  ESWT administered and tolerated well.Treatment settings initiated at:   Energy: 15  Ended treatment session today with 3000 shocks at the following settings:   Energy: 15  Frequency: 6.0  Joules: 14.72   Reviewed post EPAT instructions. Advised to avoid ice and NSAIDs throughout the treatment process and to utilize boot or supportive shoes for at least the next 3 days.  Follow up for 2 treatment in 1 week.

## 2021-02-07 ENCOUNTER — Other Ambulatory Visit (HOSPITAL_COMMUNITY): Payer: Self-pay

## 2021-02-18 DIAGNOSIS — G43709 Chronic migraine without aura, not intractable, without status migrainosus: Secondary | ICD-10-CM | POA: Insufficient documentation

## 2021-02-18 NOTE — Progress Notes (Signed)
02/19/2021 ALL: She continues Botox, propranolol 80mg  daily, topiramate 100mg  BID and Ubrelvy. She rarely needs Toradol. She does use cyclobenzaprine for muscle tension leading to headaches. She feels that she is doing really well. May have 1-2 migraines per month, on average. She reports not taking propranolol while on vacation this summer and felt terribly. She feels that LS injection helps with right muscle tension. She has had masseter injections in the past that help with clenching.   11/19/2020 ALL: She feels Botox works very well for about 2.5 months then headaches return. We increased propranolol in 08/2020 to 80mg  ER daily. She reports this has helped with headaches. She continues topiramate 100mg  BID and Ubrelvy 100mg  PRN. She continues to have increased anxiety and feels stress contributes to headaches. Vibryyd worsened headaches. She was recently started on Wellbutrin. Toradol used sparingly for intractable headaches. She has had more tension in right shoulder/upper back.   08/09/2020 ALL: She is doing well, today. She does feel headaches have been a little more frequent over the past two weeks but feels that recent illness and being off schedule with Botox has played a role. She continues to have about 10 migraines every month. She is considering increasing propranolol. Will wait to see what happens with this procedure. She continues topiramate and Nurtec/Ubrelvy.   04/23/2020 ALL: She is doing well. Botox is helping. She continues topiramate 100mg  BID and propranolol 60mg  daily. She alternates Nurtec and Ubrelvy. She usually uses Nurtec then will take if headache continues. She has about 10-12 migraines per month.  Stress is contributor.    Consent Form Botulism Toxin Injection For Chronic Migraine    Reviewed orally with patient, additionally signature is on file:  Botulism toxin has been approved by the Federal drug administration for treatment of chronic migraine. Botulism  toxin does not cure chronic migraine and it may not be effective in some patients.  The administration of botulism toxin is accomplished by injecting a small amount of toxin into the muscles of the neck and head. Dosage must be titrated for each individual. Any benefits resulting from botulism toxin tend to wear off after 3 months with a repeat injection required if benefit is to be maintained. Injections are usually done every 3-4 months with maximum effect peak achieved by about 2 or 3 weeks. Botulism toxin is expensive and you should be sure of what costs you will incur resulting from the injection.  The side effects of botulism toxin use for chronic migraine may include:   -Transient, and usually mild, facial weakness with facial injections  -Transient, and usually mild, head or neck weakness with head/neck injections  -Reduction or loss of forehead facial animation due to forehead muscle weakness  -Eyelid drooping  -Dry eye  -Pain at the site of injection or bruising at the site of injection  -Double vision  -Potential unknown long term risks   Contraindications: You should not have Botox if you are pregnant, nursing, allergic to albumin, have an infection, skin condition, or muscle weakness at the site of the injection, or have myasthenia gravis, Lambert-Eaton syndrome, or ALS.  It is also possible that as with any injection, there may be an allergic reaction or no effect from the medication. Reduced effectiveness after repeated injections is sometimes seen and rarely infection at the injection site may occur. All care will be taken to prevent these side effects. If therapy is given over a long time, atrophy and wasting in the muscle injected may occur.  Occasionally the patient's become refractory to treatment because they develop antibodies to the toxin. In this event, therapy needs to be modified.  I have read the above information and consent to the administration of botulism  toxin.    BOTOX PROCEDURE NOTE FOR MIGRAINE HEADACHE  Contraindications and precautions discussed with patient(above). Aseptic procedure was observed and patient tolerated procedure. Procedure performed by Shawnie Dapper, FNP-C.   The condition has existed for more than 6 months, and pt does not have a diagnosis of ALS, Myasthenia Gravis or Lambert-Eaton Syndrome.  Risks and benefits of injections discussed and pt agrees to proceed with the procedure.  Written consent obtained  These injections are medically necessary. Pt  receives good benefits from these injections. These injections do not cause sedations or hallucinations which the oral therapies may cause.   Description of procedure:  The patient was placed in a sitting position. The standard protocol was used for Botox as follows, with 5 units of Botox injected at each site:  -Procerus muscle, midline injection  -Corrugator muscle, bilateral injection  -Frontalis muscle, bilateral injection, with 2 sites each side, medial injection was performed in the upper one third of the frontalis muscle, in the region vertical from the medial inferior edge of the superior orbital rim. The lateral injection was again in the upper one third of the forehead vertically above the lateral limbus of the cornea, 1.5 cm lateral to the medial injection site.  -Temporalis muscle injection, 4 sites, bilaterally. The first injection was 3 cm above the tragus of the ear, second injection site was 1.5 cm to 3 cm up from the first injection site in line with the tragus of the ear. The third injection site was 1.5-3 cm forward between the first 2 injection sites. The fourth injection site was 1.5 cm posterior to the second injection site. 5th site laterally in the temporalis  muscleat the level of the outer canthus.  -Occipitalis muscle injection, 3 sites, bilaterally. The first injection was done one half way between the occipital protuberance and the tip of the  mastoid process behind the ear. The second injection site was done lateral and superior to the first, 1 fingerbreadth from the first injection. The third injection site was 1 fingerbreadth superiorly and medially from the first injection site.  -Cervical paraspinal muscle injection, 2 sites, bilaterally. The first injection site was 1 cm from the midline of the cervical spine, 3 cm inferior to the lower border of the occipital protuberance. The second injection site was 1.5 cm superiorly and laterally to the first injection site.  -Trapezius muscle injection was performed at 3 sites, bilaterally. The first injection site was in the upper trapezius muscle halfway between the inflection point of the neck, and the acromion. The second injection site was one half way between the acromion and the first injection site. The third injection was done between the first injection site and the inflection point of the neck.  - LS injected with 10 units on right side.   -masseter injections 3units bilaterally    Will return for repeat injection in 3 months.   A total of 200 units of Botox was prepared, 171 units of Botox was injected as documented above, any Botox not injected was wasted. The patient tolerated the procedure well, there were no complications of the above procedure.

## 2021-02-19 ENCOUNTER — Other Ambulatory Visit: Payer: Self-pay | Admitting: Nurse Practitioner

## 2021-02-19 ENCOUNTER — Other Ambulatory Visit (HOSPITAL_COMMUNITY): Payer: Self-pay

## 2021-02-19 ENCOUNTER — Telehealth: Payer: 59 | Admitting: Physician Assistant

## 2021-02-19 ENCOUNTER — Ambulatory Visit: Payer: 59 | Admitting: Family Medicine

## 2021-02-19 ENCOUNTER — Encounter: Payer: Self-pay | Admitting: Family Medicine

## 2021-02-19 ENCOUNTER — Telehealth: Payer: Self-pay | Admitting: Family Medicine

## 2021-02-19 VITALS — BP 110/76 | HR 75 | Ht 61.0 in | Wt 141.0 lb

## 2021-02-19 DIAGNOSIS — B9689 Other specified bacterial agents as the cause of diseases classified elsewhere: Secondary | ICD-10-CM

## 2021-02-19 DIAGNOSIS — G43709 Chronic migraine without aura, not intractable, without status migrainosus: Secondary | ICD-10-CM | POA: Diagnosis not present

## 2021-02-19 DIAGNOSIS — J019 Acute sinusitis, unspecified: Secondary | ICD-10-CM

## 2021-02-19 DIAGNOSIS — F5101 Primary insomnia: Secondary | ICD-10-CM

## 2021-02-19 DIAGNOSIS — G43711 Chronic migraine without aura, intractable, with status migrainosus: Secondary | ICD-10-CM

## 2021-02-19 DIAGNOSIS — M62838 Other muscle spasm: Secondary | ICD-10-CM

## 2021-02-19 MED ORDER — AMOXICILLIN-POT CLAVULANATE 875-125 MG PO TABS: 1.0000 | ORAL_TABLET | Freq: Two times a day (BID) | ORAL | 0 refills | Status: DC

## 2021-02-19 MED ORDER — KETOROLAC TROMETHAMINE 60 MG/2ML IM SOLN
INTRAMUSCULAR | 0 refills | Status: DC
Start: 2021-02-19 — End: 2021-07-23
  Filled 2021-02-19: qty 10, 5d supply, fill #0

## 2021-02-19 MED ORDER — TOPIRAMATE 100 MG PO TABS
100.0000 mg | ORAL_TABLET | Freq: Two times a day (BID) | ORAL | 3 refills | Status: DC
  Filled 2021-02-19: qty 180, 90d supply, fill #0
  Filled 2021-05-20: qty 180, 90d supply, fill #1

## 2021-02-19 MED ORDER — FLUCONAZOLE 150 MG PO TABS: 150.0000 mg | ORAL_TABLET | Freq: Once | ORAL | 0 refills | Status: AC

## 2021-02-19 MED ORDER — PROPRANOLOL HCL ER 80 MG PO CP24
80.0000 mg | ORAL_CAPSULE | Freq: Every day | ORAL | 3 refills | Status: DC
  Filled 2021-02-19: qty 90, 90d supply, fill #0
  Filled 2021-05-20: qty 90, 90d supply, fill #1

## 2021-02-19 MED ORDER — CYCLOBENZAPRINE HCL 5 MG PO TABS
5.0000 mg | ORAL_TABLET | Freq: Three times a day (TID) | ORAL | 0 refills | Status: DC | PRN
  Filled 2021-02-19: qty 60, 20d supply, fill #0

## 2021-02-19 MED FILL — Ubrogepant Tab 100 MG: ORAL | 30 days supply | Qty: 10 | Fill #4 | Status: AC

## 2021-02-19 NOTE — Telephone Encounter (Signed)
Patient has a Botox appointment today. She has [employer], now requiring PA for Botox. I spoke with Tobi Bastos @ [facility] [phone]) to create a case. Tobi Bastos states PA for [phone], G43.709 is pending for clinical notes. I faxed notes to [phone]. Reference [id number].

## 2021-02-19 NOTE — Progress Notes (Signed)
E-Visit for Sinus Problems  We are sorry that you are not feeling well.  Here is how we plan to help!  Based on what you have shared with me it looks like you have sinusitis.  Sinusitis is inflammation and infection in the sinus cavities of the head.  Based on your presentation I believe you most likely have Acute Bacterial Sinusitis.  This is an infection caused by bacteria and is treated with antibiotics. I have prescribed Augmentin 875mg /125mg  one tablet twice daily with food, for 7 days. I have also sent in a Diflucan in case of yeast.  You may use an oral decongestant such as Mucinex D or if you have glaucoma or high blood pressure use plain Mucinex. Saline nasal spray help and can safely be used as often as needed for congestion.  If you develop worsening sinus pain, fever or notice severe headache and vision changes, or if symptoms are not better after completion of antibiotic, please schedule an appointment with a health care provider.    Sinus infections are not as easily transmitted as other respiratory infection, however we still recommend that you avoid close contact with loved ones, especially the very young and elderly.  Remember to wash your hands thoroughly throughout the day as this is the number one way to prevent the spread of infection!  Home Care: Only take medications as instructed by your medical team. Complete the entire course of an antibiotic. Do not take these medications with alcohol. A steam or ultrasonic humidifier can help congestion.  You can place a towel over your head and breathe in the steam from hot water coming from a faucet. Avoid close contacts especially the very young and the elderly. Cover your mouth when you cough or sneeze. Always remember to wash your hands.  Get Help Right Away If: You develop worsening fever or sinus pain. You develop a severe head ache or visual changes. Your symptoms persist after you have completed your treatment plan.  Make  sure you Understand these instructions. Will watch your condition. Will get help right away if you are not doing well or get worse.  Thank you for choosing an e-visit.  Your e-visit answers were reviewed by a board certified advanced clinical practitioner to complete your personal care plan. Depending upon the condition, your plan could have included both over the counter or prescription medications.  Please review your pharmacy choice. Make sure the pharmacy is open so you can pick up prescription now. If there is a problem, you may contact your provider through and have the prescription routed to another pharmacy.  Your safety is important to [employer]. If you have drug allergies check your prescription carefully.   For the next 24 hours you can use MyChart to ask questions about today's visit, request a non-urgent call back, or ask for a work or school excuse. You will get an email in the next two days asking about your experience. I hope that your e-visit has been valuable and will speed your recovery.

## 2021-02-19 NOTE — Progress Notes (Signed)
I have spent 5 minutes in review of e-visit questionnaire, review and updating patient chart, medical decision making and response to patient.   Keshia Weare Cody Cheryle Dark, PA-C    

## 2021-02-19 NOTE — Progress Notes (Signed)
Botox- 200 units x 1 vial Lot: [id number] Expiration: 07/2023 NDC: 4497-5300-51  Bacteriostatic 0.9% Sodium Chloride- 59mL total Lot: [id number] Expiration: 07/07/2022 NDC: 7356-7014-10  Dx: V01.314 B/B

## 2021-02-20 ENCOUNTER — Other Ambulatory Visit: Payer: Self-pay

## 2021-02-21 ENCOUNTER — Other Ambulatory Visit: Payer: Self-pay

## 2021-02-21 DIAGNOSIS — D72829 Elevated white blood cell count, unspecified: Secondary | ICD-10-CM

## 2021-02-22 ENCOUNTER — Ambulatory Visit (INDEPENDENT_AMBULATORY_CARE_PROVIDER_SITE_OTHER): Payer: Self-pay

## 2021-02-22 ENCOUNTER — Other Ambulatory Visit: Payer: Self-pay

## 2021-02-22 ENCOUNTER — Other Ambulatory Visit (HOSPITAL_COMMUNITY): Payer: Self-pay

## 2021-02-22 DIAGNOSIS — M722 Plantar fascial fibromatosis: Secondary | ICD-10-CM

## 2021-02-22 DIAGNOSIS — G5621 Lesion of ulnar nerve, right upper limb: Secondary | ICD-10-CM | POA: Diagnosis not present

## 2021-02-22 DIAGNOSIS — G5602 Carpal tunnel syndrome, left upper limb: Secondary | ICD-10-CM | POA: Diagnosis not present

## 2021-02-22 DIAGNOSIS — M5412 Radiculopathy, cervical region: Secondary | ICD-10-CM | POA: Diagnosis not present

## 2021-02-22 DIAGNOSIS — B351 Tinea unguium: Secondary | ICD-10-CM

## 2021-02-22 NOTE — Progress Notes (Signed)
Patient presents for the 2nd EPAT treatment today with complaint of bilateral  heel pain . Diagnosed with plantar fasciitis by Dr. Allena Katz. This has been ongoing for several months. The patient has tried ice, stretching, NSAIDS and supportive shoe gear with no long term relief.   Most of the pain is located heel .  ESWT administered and tolerated well.Treatment settings initiated at:   Energy: 20  Ended treatment session today with 3000 shocks at the following settings:   Energy: 20  Frequency: 5.0  Joules: 19.62   Reviewed post EPAT instructions. Advised to avoid ice and NSAIDs throughout the treatment process and to utilize boot or supportive shoes for at least the next 3 days.  Follow up for 3rd treatment in 1 week.

## 2021-02-25 ENCOUNTER — Other Ambulatory Visit (HOSPITAL_COMMUNITY): Payer: Self-pay

## 2021-02-27 NOTE — Telephone Encounter (Signed)
Received approval from [facility]. [id number] #[id number] (02/19/21- 08/22/21).

## 2021-02-28 ENCOUNTER — Other Ambulatory Visit: Payer: 59

## 2021-02-28 ENCOUNTER — Other Ambulatory Visit: Payer: Self-pay

## 2021-02-28 DIAGNOSIS — D72829 Elevated white blood cell count, unspecified: Secondary | ICD-10-CM

## 2021-03-01 ENCOUNTER — Other Ambulatory Visit (HOSPITAL_COMMUNITY): Payer: Self-pay

## 2021-03-01 LAB — CBC WITH DIFFERENTIAL/PLATELET
Basophils Absolute: 0 10*3/uL (ref 0.0–0.2)
Basos: 0 %
EOS (ABSOLUTE): 0.2 10*3/uL (ref 0.0–0.4)
Eos: 2 %
Hematocrit: 38.7 % (ref 34.0–46.6)
Hemoglobin: 13.2 g/dL (ref 11.1–15.9)
Immature Grans (Abs): 0 10*3/uL (ref 0.0–0.1)
Immature Granulocytes: 0 %
Lymphocytes Absolute: 2.5 10*3/uL (ref 0.7–3.1)
Lymphs: 34 %
MCH: 32.8 pg (ref 26.6–33.0)
MCHC: 34.1 g/dL (ref 31.5–35.7)
MCV: 96 fL (ref 79–97)
Monocytes Absolute: 0.6 10*3/uL (ref 0.1–0.9)
Monocytes: 8 %
Neutrophils Absolute: 4.1 10*3/uL (ref 1.4–7.0)
Neutrophils: 56 %
Platelets: 476 10*3/uL — ABNORMAL HIGH (ref 150–450)
RBC: 4.03 x10E6/uL (ref 3.77–5.28)
RDW: 12.3 % (ref 11.7–15.4)
WBC: 7.4 10*3/uL (ref 3.4–10.8)

## 2021-03-01 MED ORDER — GABAPENTIN 300 MG PO CAPS
300.0000 mg | ORAL_CAPSULE | Freq: Three times a day (TID) | ORAL | 0 refills | Status: DC
  Filled 2021-03-01 – 2021-03-06 (×2): qty 90, 30d supply, fill #0

## 2021-03-04 ENCOUNTER — Other Ambulatory Visit: Payer: Self-pay

## 2021-03-04 ENCOUNTER — Ambulatory Visit (INDEPENDENT_AMBULATORY_CARE_PROVIDER_SITE_OTHER): Payer: 59

## 2021-03-04 DIAGNOSIS — M722 Plantar fascial fibromatosis: Secondary | ICD-10-CM

## 2021-03-04 DIAGNOSIS — B351 Tinea unguium: Secondary | ICD-10-CM

## 2021-03-04 NOTE — Progress Notes (Signed)
Patient presents for the 3rd EPAT treatment today with complaint of bilateral  heel pain . Diagnosed with plantar fasciitis by Dr. Allena Katz. This has been ongoing for several months. The patient has tried ice, stretching, NSAIDS and supportive shoe gear with no long term relief.   Most of the pain is located heel .  ESWT administered and tolerated well.Treatment settings initiated at:   Energy: 25  Ended treatment session today with 3000 shocks at the following settings:   Energy: 25  Frequency: 4.0  Joules: 24.52   Reviewed post EPAT instructions. Advised to avoid ice and NSAIDs throughout the treatment process and to utilize boot or supportive shoes for at least the next 3 days.  Follow up for 4th treatment in 2 week.

## 2021-03-06 ENCOUNTER — Other Ambulatory Visit (HOSPITAL_COMMUNITY): Payer: Self-pay

## 2021-03-06 ENCOUNTER — Encounter: Payer: Self-pay | Admitting: Family Medicine

## 2021-03-12 ENCOUNTER — Encounter: Payer: Self-pay | Admitting: Family Medicine

## 2021-03-12 DIAGNOSIS — Z0289 Encounter for other administrative examinations: Secondary | ICD-10-CM

## 2021-03-12 NOTE — Telephone Encounter (Signed)
Gave completed/signed FMLA back to medical records to process for pt. 

## 2021-03-13 ENCOUNTER — Telehealth: Payer: Self-pay | Admitting: *Deleted

## 2021-03-13 ENCOUNTER — Other Ambulatory Visit: Payer: Self-pay | Admitting: *Deleted

## 2021-03-13 ENCOUNTER — Other Ambulatory Visit (HOSPITAL_COMMUNITY): Payer: Self-pay

## 2021-03-13 DIAGNOSIS — R110 Nausea: Secondary | ICD-10-CM

## 2021-03-13 DIAGNOSIS — G5622 Lesion of ulnar nerve, left upper limb: Secondary | ICD-10-CM | POA: Diagnosis not present

## 2021-03-13 DIAGNOSIS — G5602 Carpal tunnel syndrome, left upper limb: Secondary | ICD-10-CM | POA: Diagnosis not present

## 2021-03-13 DIAGNOSIS — G43909 Migraine, unspecified, not intractable, without status migrainosus: Secondary | ICD-10-CM

## 2021-03-13 DIAGNOSIS — G8918 Other acute postprocedural pain: Secondary | ICD-10-CM | POA: Diagnosis not present

## 2021-03-13 MED ORDER — ONDANSETRON 4 MG PO TBDP
4.0000 mg | ORAL_TABLET | Freq: Three times a day (TID) | ORAL | 11 refills | Status: DC | PRN
  Filled 2021-03-13: qty 30, 5d supply, fill #0
  Filled 2021-07-22: qty 30, 5d supply, fill #1
  Filled 2021-10-16: qty 30, 5d supply, fill #2
  Filled 2021-11-13: qty 30, 5d supply, fill #3
  Filled 2022-02-25: qty 30, 5d supply, fill #4

## 2021-03-13 MED ORDER — NURTEC 75 MG PO TBDP
75.0000 mg | ORAL_TABLET | Freq: Once | ORAL | 11 refills | Status: DC | PRN
  Filled 2021-03-13: qty 8, 30d supply, fill #0
  Filled 2021-05-20: qty 8, 30d supply, fill #1
  Filled 2021-06-19: qty 8, 30d supply, fill #2
  Filled 2021-07-22: qty 8, 30d supply, fill #3

## 2021-03-13 NOTE — Telephone Encounter (Signed)
I faxed pt [employer] form on 03/13/21 to [phone]

## 2021-03-14 ENCOUNTER — Other Ambulatory Visit: Payer: 59

## 2021-03-20 ENCOUNTER — Other Ambulatory Visit (HOSPITAL_COMMUNITY): Payer: Self-pay

## 2021-03-20 MED FILL — Ubrogepant Tab 100 MG: ORAL | 30 days supply | Qty: 10 | Fill #5 | Status: AC

## 2021-03-21 ENCOUNTER — Other Ambulatory Visit (HOSPITAL_COMMUNITY): Payer: Self-pay

## 2021-03-22 ENCOUNTER — Other Ambulatory Visit: Payer: Self-pay

## 2021-03-22 ENCOUNTER — Other Ambulatory Visit (HOSPITAL_COMMUNITY): Payer: Self-pay

## 2021-03-22 ENCOUNTER — Ambulatory Visit (INDEPENDENT_AMBULATORY_CARE_PROVIDER_SITE_OTHER): Payer: Self-pay

## 2021-03-22 DIAGNOSIS — B351 Tinea unguium: Secondary | ICD-10-CM

## 2021-03-22 DIAGNOSIS — M722 Plantar fascial fibromatosis: Secondary | ICD-10-CM

## 2021-03-22 NOTE — Progress Notes (Signed)
Patient presents for the 4th EPAT treatment today with complaint of bilateral  heel pain . Diagnosed with plantar fasciitis by Dr. Allena Katz. This has been ongoing for several months. The patient has tried ice, stretching, NSAIDS and supportive shoe gear with no long term relief.   Most of the pain is located heel .  ESWT administered and tolerated well.Treatment settings initiated at:   Energy: 30  Ended treatment session today with 3000 shocks at the following settings:   Energy: 30  Frequency: 4.0  Joules: 29.43   Reviewed post EPAT instructions. Advised to avoid ice and NSAIDs throughout the treatment process and to utilize boot or supportive shoes for at least the next 3 days.  Follow up with Dr. Allena Katz

## 2021-03-28 DIAGNOSIS — G5621 Lesion of ulnar nerve, right upper limb: Secondary | ICD-10-CM | POA: Diagnosis not present

## 2021-03-28 DIAGNOSIS — M25622 Stiffness of left elbow, not elsewhere classified: Secondary | ICD-10-CM | POA: Diagnosis not present

## 2021-03-28 DIAGNOSIS — G5602 Carpal tunnel syndrome, left upper limb: Secondary | ICD-10-CM | POA: Diagnosis not present

## 2021-03-28 DIAGNOSIS — Z4789 Encounter for other orthopedic aftercare: Secondary | ICD-10-CM | POA: Diagnosis not present

## 2021-04-02 DIAGNOSIS — M25629 Stiffness of unspecified elbow, not elsewhere classified: Secondary | ICD-10-CM | POA: Diagnosis not present

## 2021-04-08 DIAGNOSIS — M25522 Pain in left elbow: Secondary | ICD-10-CM | POA: Diagnosis not present

## 2021-04-16 DIAGNOSIS — M9903 Segmental and somatic dysfunction of lumbar region: Secondary | ICD-10-CM | POA: Diagnosis not present

## 2021-04-16 DIAGNOSIS — M9902 Segmental and somatic dysfunction of thoracic region: Secondary | ICD-10-CM | POA: Diagnosis not present

## 2021-04-16 DIAGNOSIS — M9904 Segmental and somatic dysfunction of sacral region: Secondary | ICD-10-CM | POA: Diagnosis not present

## 2021-04-16 DIAGNOSIS — M9901 Segmental and somatic dysfunction of cervical region: Secondary | ICD-10-CM | POA: Diagnosis not present

## 2021-04-17 ENCOUNTER — Ambulatory Visit: Payer: 59 | Admitting: Podiatry

## 2021-04-17 ENCOUNTER — Other Ambulatory Visit: Payer: Self-pay

## 2021-04-17 DIAGNOSIS — M722 Plantar fascial fibromatosis: Secondary | ICD-10-CM | POA: Diagnosis not present

## 2021-04-17 MED FILL — Ubrogepant Tab 100 MG: ORAL | 30 days supply | Qty: 10 | Fill #6 | Status: AC

## 2021-04-18 ENCOUNTER — Other Ambulatory Visit (HOSPITAL_COMMUNITY): Payer: Self-pay

## 2021-04-18 DIAGNOSIS — M9903 Segmental and somatic dysfunction of lumbar region: Secondary | ICD-10-CM | POA: Diagnosis not present

## 2021-04-18 DIAGNOSIS — M9904 Segmental and somatic dysfunction of sacral region: Secondary | ICD-10-CM | POA: Diagnosis not present

## 2021-04-18 DIAGNOSIS — M9902 Segmental and somatic dysfunction of thoracic region: Secondary | ICD-10-CM | POA: Diagnosis not present

## 2021-04-18 DIAGNOSIS — M9901 Segmental and somatic dysfunction of cervical region: Secondary | ICD-10-CM | POA: Diagnosis not present

## 2021-04-19 ENCOUNTER — Other Ambulatory Visit (HOSPITAL_COMMUNITY): Payer: Self-pay

## 2021-04-22 NOTE — Progress Notes (Signed)
Subjective:  Patient ID: Erica Rodriguez, female    DOB: 1985/07/22,  MRN: [id number]  Chief Complaint  Patient presents with   Plantar Fasciitis    PT stated that the EPAT did not help and she is still having pain in her foot     DOS: 05/21/2020 Procedure: Right EPF  35 y.o. female returns for post-op check.  Patient states that she is having a lot of pain to both of the heels and has progressed to gotten worse.  The right is greater than left side.  The pad did not help much.  She denies any other acute complaints.  She would like to discuss treatment options  Review of Systems: Negative except as noted in the HPI. Denies N/V/F/Ch.  Past Medical History:  Diagnosis Date   Depression    Headache    migraines   Mild preeclampsia     Current Outpatient Medications:    amoxicillin-clavulanate (AUGMENTIN) 875-125 MG tablet, Take 1 tablet by mouth 2 (two) times daily., Disp: 14 tablet, Rfl: 0   buPROPion (WELLBUTRIN XL) 150 MG 24 hr tablet, Take 1 tablet (150 mg total) by mouth daily., Disp: 90 tablet, Rfl: 1   clindamycin-benzoyl peroxide (BENZACLIN) gel, Apply topically 2 (two) times daily. (Patient taking differently: Apply topically as needed.), Disp: 25 g, Rfl: 0   cyanocobalamin (,VITAMIN B-12,) 1000 MCG/ML injection, Inject 1 mL (1,000 mcg total) into the muscle every 30 days, Disp: 3 mL, Rfl: 1   cyclobenzaprine (FLEXERIL) 5 MG tablet, Take 1 tablet (5 mg total) by mouth 3 (three) times daily as needed for muscle spasms, Disp: 60 tablet, Rfl: 0   gabapentin (NEURONTIN) 300 MG capsule, Take 1 capsule (300 mg total) by mouth 3 (three) times daily., Disp: 90 capsule, Rfl: 0   ketorolac (TORADOL) 60 MG/2ML SOLN injection, INJECT (60MG ) INTO THE MUSCLE AT ONSET OF MIGRAINE. MAY REPEAT IN 6 HOURS. MAX TWICE A DAY AND 4 DAYS PER MONTH., Disp: 10 mL, Rfl: 0   MIRENA, 52 MG, 20 MCG/24HR IUD, , Disp: , Rfl:    OnabotulinumtoxinA (BOTOX IM), Inject into the muscle. 155units IM to head and  neck every 3 months for migraines., Disp: , Rfl:    ondansetron (ZOFRAN ODT) 4 MG disintegrating tablet, Take 1-2 tablets (4-8 mg total) by mouth every 8 (eight) hours as needed for nausea or vomiting., Disp: 30 tablet, Rfl: 11   propranolol ER (INDERAL LA) 80 MG 24 hr capsule, Take 1 capsule (80 mg total) by mouth daily., Disp: 90 capsule, Rfl: 3   Rimegepant Sulfate (NURTEC) 75 MG TBDP, Take 1 tablet (75 mg) by mouth once daily as needed for up to 1 dose. Try to take as soon as possible for migraine., Disp: 8 tablet, Rfl: 11   topiramate (TOPAMAX) 100 MG tablet, Take 1 tablet (100 mg total) by mouth 2 (two) times daily., Disp: 180 tablet, Rfl: 3   Ubrogepant 100 MG TABS, TAKE 1 TABLET (100 MG) BY MOUTH AT ONSET OF HEADACHE, MAY REPEAT 1 TABLET IN 2 HOURS, NO MORE THAN 2 TABLETS IN 24 HOURS, Disp: 10 tablet, Rfl: 11   zolpidem (AMBIEN) 10 MG tablet, TAKE 1 TABLET (10 MG TOTAL) BY MOUTH AT BEDTIME AS NEEDED FOR SLEEP., Disp: 30 tablet, Rfl: 4  Current Facility-Administered Medications:    cyanocobalamin ((VITAMIN B-12)) injection 1,000 mcg, 1,000 mcg, Intramuscular, Q30 days, , [name], FNP, 1,000 mcg at 05/26/19 1035  Social History   Tobacco Use  Smoking Status Never  Smokeless Tobacco Never    No Known Allergies Objective:  There were no vitals filed for this visit. There is no height or weight on file to calculate BMI. Constitutional Well developed. Well nourished.  Vascular Foot warm and well perfused. Capillary refill normal to all digits.   Neurologic Normal speech. Oriented to person, place, and time. Epicritic sensation to light touch grossly present bilaterally.  Dermatologic  incision has completely very epithelialized. No  Taunt  fascia noted plantar fascia noted.  Orthopedic: No completely epithelialized. Tenderness to palpation noted about the surgical site.   Radiographs: None Assessment:   1. Plantar fasciitis of right foot   2. Plantar fasciitis of  left foot     Plan:  Patient was evaluated and treated and all questions answered. \ Bilateral plantar fasciitis status post right foot EPF 05/27/2020 -I explained the patient etiology of recurrence and continues pain that she is experiencingFrom it.  Given that the left side is much worse than right side I believe patient will benefit from steroid injection to both sides.  Patient agrees with the plan like to proceed with steroid injection. -Continue using plantar fascial brace and orthotics and make shoe gear modification -I will schedule this patient for PRP injection and we will plan on doing both sides with 1 injection  No follow-ups on file.   Pes planovalgus -I explained to the patient the etiology of pes planovalgus of her extremity options were discussed.  I believe patient will benefit from custom-made orthotics to help control hindfoot motion and support the arch of the foot take the stress away from the plantar fascia.   -Patient has obtained orthotics and functioning well with them. No acute complaints  No follow-ups on file.

## 2021-04-23 DIAGNOSIS — M9902 Segmental and somatic dysfunction of thoracic region: Secondary | ICD-10-CM | POA: Diagnosis not present

## 2021-04-23 DIAGNOSIS — M9901 Segmental and somatic dysfunction of cervical region: Secondary | ICD-10-CM | POA: Diagnosis not present

## 2021-04-23 DIAGNOSIS — M25532 Pain in left wrist: Secondary | ICD-10-CM | POA: Diagnosis not present

## 2021-04-23 DIAGNOSIS — M9904 Segmental and somatic dysfunction of sacral region: Secondary | ICD-10-CM | POA: Diagnosis not present

## 2021-04-23 DIAGNOSIS — M9903 Segmental and somatic dysfunction of lumbar region: Secondary | ICD-10-CM | POA: Diagnosis not present

## 2021-04-24 ENCOUNTER — Ambulatory Visit (INDEPENDENT_AMBULATORY_CARE_PROVIDER_SITE_OTHER): Payer: 59 | Admitting: Podiatry

## 2021-04-24 ENCOUNTER — Other Ambulatory Visit: Payer: Self-pay

## 2021-04-24 DIAGNOSIS — M722 Plantar fascial fibromatosis: Secondary | ICD-10-CM

## 2021-04-24 DIAGNOSIS — M9901 Segmental and somatic dysfunction of cervical region: Secondary | ICD-10-CM | POA: Diagnosis not present

## 2021-04-24 DIAGNOSIS — M9902 Segmental and somatic dysfunction of thoracic region: Secondary | ICD-10-CM | POA: Diagnosis not present

## 2021-04-24 DIAGNOSIS — M9904 Segmental and somatic dysfunction of sacral region: Secondary | ICD-10-CM | POA: Diagnosis not present

## 2021-04-24 DIAGNOSIS — M9903 Segmental and somatic dysfunction of lumbar region: Secondary | ICD-10-CM | POA: Diagnosis not present

## 2021-04-26 NOTE — Progress Notes (Signed)
Patient presents today for PRP injection to bilateral heel.  6 cc of PRP injection was drawn and given in standard technique to both heels.  No complication noted.

## 2021-05-02 ENCOUNTER — Other Ambulatory Visit (HOSPITAL_COMMUNITY): Payer: Self-pay

## 2021-05-02 DIAGNOSIS — M79642 Pain in left hand: Secondary | ICD-10-CM | POA: Diagnosis not present

## 2021-05-02 MED ORDER — GABAPENTIN 300 MG PO CAPS
300.0000 mg | ORAL_CAPSULE | Freq: Three times a day (TID) | ORAL | 0 refills | Status: DC
  Filled 2021-05-02: qty 90, 30d supply, fill #0

## 2021-05-03 DIAGNOSIS — G5621 Lesion of ulnar nerve, right upper limb: Secondary | ICD-10-CM | POA: Diagnosis not present

## 2021-05-03 DIAGNOSIS — Z4789 Encounter for other orthopedic aftercare: Secondary | ICD-10-CM | POA: Diagnosis not present

## 2021-05-03 DIAGNOSIS — M79644 Pain in right finger(s): Secondary | ICD-10-CM | POA: Diagnosis not present

## 2021-05-03 DIAGNOSIS — G5602 Carpal tunnel syndrome, left upper limb: Secondary | ICD-10-CM | POA: Diagnosis not present

## 2021-05-07 DIAGNOSIS — M9902 Segmental and somatic dysfunction of thoracic region: Secondary | ICD-10-CM | POA: Diagnosis not present

## 2021-05-07 DIAGNOSIS — M9903 Segmental and somatic dysfunction of lumbar region: Secondary | ICD-10-CM | POA: Diagnosis not present

## 2021-05-07 DIAGNOSIS — M79642 Pain in left hand: Secondary | ICD-10-CM | POA: Diagnosis not present

## 2021-05-07 DIAGNOSIS — M9901 Segmental and somatic dysfunction of cervical region: Secondary | ICD-10-CM | POA: Diagnosis not present

## 2021-05-07 DIAGNOSIS — M9904 Segmental and somatic dysfunction of sacral region: Secondary | ICD-10-CM | POA: Diagnosis not present

## 2021-05-13 ENCOUNTER — Telehealth: Payer: 59 | Admitting: Physician Assistant

## 2021-05-13 DIAGNOSIS — J019 Acute sinusitis, unspecified: Secondary | ICD-10-CM

## 2021-05-13 DIAGNOSIS — B9689 Other specified bacterial agents as the cause of diseases classified elsewhere: Secondary | ICD-10-CM | POA: Diagnosis not present

## 2021-05-13 MED ORDER — AMOXICILLIN-POT CLAVULANATE 875-125 MG PO TABS: 1.0000 | ORAL_TABLET | Freq: Two times a day (BID) | ORAL | 0 refills | Status: DC

## 2021-05-13 MED ORDER — DOXYCYCLINE HYCLATE 100 MG PO TABS: 100.0000 mg | ORAL_TABLET | Freq: Two times a day (BID) | ORAL | 0 refills | Status: DC

## 2021-05-13 NOTE — Progress Notes (Signed)

## 2021-05-13 NOTE — Addendum Note (Signed)
Addended by: Waldon Merl on: 05/13/2021 06:27 PM   Modules accepted: Orders

## 2021-05-13 NOTE — Progress Notes (Signed)
I have spent 5 minutes in review of e-visit questionnaire, review and updating patient chart, medical decision making and response to patient.   Lisamarie Coke Cody Kamauri Denardo, PA-C    

## 2021-05-15 ENCOUNTER — Other Ambulatory Visit: Payer: Self-pay

## 2021-05-15 ENCOUNTER — Other Ambulatory Visit (HOSPITAL_COMMUNITY): Payer: Self-pay

## 2021-05-15 ENCOUNTER — Ambulatory Visit: Payer: 59 | Admitting: Podiatry

## 2021-05-15 DIAGNOSIS — M722 Plantar fascial fibromatosis: Secondary | ICD-10-CM

## 2021-05-17 ENCOUNTER — Ambulatory Visit (INDEPENDENT_AMBULATORY_CARE_PROVIDER_SITE_OTHER): Payer: 59 | Admitting: Podiatry

## 2021-05-17 ENCOUNTER — Other Ambulatory Visit: Payer: Self-pay

## 2021-05-17 DIAGNOSIS — M722 Plantar fascial fibromatosis: Secondary | ICD-10-CM

## 2021-05-17 NOTE — Progress Notes (Signed)
PRP injection was delivered in standard technique.  6 cc was injected to bilateral heel with 3 cc in each heel.  No acute complication noted.  Patient tolerated it well.

## 2021-05-17 NOTE — Progress Notes (Signed)
Subjective:  Patient ID: Erica Rodriguez, female    DOB: 1986-03-11,  MRN: [id number]  Chief Complaint  Patient presents with   Plantar Fasciitis    PT stated that she still has some discomfort mainly when sitting down she stated that she feels like the PRP helped like 10%     DOS: 05/21/2020 Procedure: Right EPF  35 y.o. female returns for post-op check.  Patient presents with follow-up of PRP injection.  She states she is doing a little bit better.  Helped about 10 to 20%.  She denies any other acute complaints she would like to discuss continuing PRP injection.  Review of Systems: Negative except as noted in the HPI. Denies N/V/F/Ch.  Past Medical History:  Diagnosis Date   Depression    Headache    migraines   Mild preeclampsia     Current Outpatient Medications:    amoxicillin-clavulanate (AUGMENTIN) 875-125 MG tablet, Take 1 tablet by mouth 2 (two) times daily., Disp: 14 tablet, Rfl: 0   buPROPion (WELLBUTRIN XL) 150 MG 24 hr tablet, Take 1 tablet (150 mg total) by mouth daily., Disp: 90 tablet, Rfl: 1   clindamycin-benzoyl peroxide (BENZACLIN) gel, Apply topically 2 (two) times daily. (Patient taking differently: Apply topically as needed.), Disp: 25 g, Rfl: 0   cyanocobalamin (,VITAMIN B-12,) 1000 MCG/ML injection, Inject 1 mL (1,000 mcg total) into the muscle every 30 days, Disp: 3 mL, Rfl: 1   cyclobenzaprine (FLEXERIL) 5 MG tablet, Take 1 tablet (5 mg total) by mouth 3 (three) times daily as needed for muscle spasms, Disp: 60 tablet, Rfl: 0   gabapentin (NEURONTIN) 300 MG capsule, Take 1 capsule (300 mg total) by mouth 3 (three) times daily., Disp: 90 capsule, Rfl: 0   ketorolac (TORADOL) 60 MG/2ML SOLN injection, INJECT (60MG ) INTO THE MUSCLE AT ONSET OF MIGRAINE. MAY REPEAT IN 6 HOURS. MAX TWICE A DAY AND 4 DAYS PER MONTH., Disp: 10 mL, Rfl: 0   MIRENA, 52 MG, 20 MCG/24HR IUD, , Disp: , Rfl:    OnabotulinumtoxinA (BOTOX IM), Inject into the muscle. 155units IM to head  and neck every 3 months for migraines., Disp: , Rfl:    ondansetron (ZOFRAN ODT) 4 MG disintegrating tablet, Take 1-2 tablets (4-8 mg total) by mouth every 8 (eight) hours as needed for nausea or vomiting., Disp: 30 tablet, Rfl: 11   propranolol ER (INDERAL LA) 80 MG 24 hr capsule, Take 1 capsule (80 mg total) by mouth daily., Disp: 90 capsule, Rfl: 3   Rimegepant Sulfate (NURTEC) 75 MG TBDP, Take 1 tablet (75 mg) by mouth once daily as needed for up to 1 dose. Try to take as soon as possible for migraine., Disp: 8 tablet, Rfl: 11   topiramate (TOPAMAX) 100 MG tablet, Take 1 tablet (100 mg total) by mouth 2 (two) times daily., Disp: 180 tablet, Rfl: 3   Ubrogepant 100 MG TABS, TAKE 1 TABLET (100 MG) BY MOUTH AT ONSET OF HEADACHE, MAY REPEAT 1 TABLET IN 2 HOURS, NO MORE THAN 2 TABLETS IN 24 HOURS, Disp: 10 tablet, Rfl: 11   zolpidem (AMBIEN) 10 MG tablet, TAKE 1 TABLET (10 MG TOTAL) BY MOUTH AT BEDTIME AS NEEDED FOR SLEEP., Disp: 30 tablet, Rfl: 4  Current Facility-Administered Medications:    cyanocobalamin ((VITAMIN B-12)) injection 1,000 mcg, 1,000 mcg, Intramuscular, Q30 days, , [name], FNP, 1,000 mcg at 05/26/19 1035  Social History   Tobacco Use  Smoking Status Never  Smokeless Tobacco Never  No Known Allergies Objective:  There were no vitals filed for this visit. There is no height or weight on file to calculate BMI. Constitutional Well developed. Well nourished.  Vascular Foot warm and well perfused. Capillary refill normal to all digits.   Neurologic Normal speech. Oriented to person, place, and time. Epicritic sensation to light touch grossly present bilaterally.  Dermatologic  incision has completely very epithelialized. No  Taunt  fascia noted plantar fascia noted.  Orthopedic: No completely epithelialized. Tenderness to palpation noted about the surgical site.   Radiographs: None Assessment:   1. Plantar fasciitis of right foot   2. Plantar fasciitis of  left foot      Plan:  Patient was evaluated and treated and all questions answered. \ Bilateral plantar fasciitis status post right foot EPF 05/27/2020 -She had about 10 to 20% improvement with the first PRP injection.  I discussed with the patient that in general takes about 3 PRP injections.  I discussed with her that we can attempted no further injections.  She states understanding and would like to proceed with PRP injections   No follow-ups on file.   Pes planovalgus -I explained to the patient the etiology of pes planovalgus of her extremity options were discussed.  I believe patient will benefit from custom-made orthotics to help control hindfoot motion and support the arch of the foot take the stress away from the plantar fascia.   -Patient has obtained orthotics and functioning well with them. No acute complaints  No follow-ups on file.

## 2021-05-20 ENCOUNTER — Other Ambulatory Visit: Payer: Self-pay

## 2021-05-20 ENCOUNTER — Other Ambulatory Visit (HOSPITAL_COMMUNITY): Payer: Self-pay

## 2021-05-20 ENCOUNTER — Encounter: Payer: Self-pay | Admitting: Family Medicine

## 2021-05-20 ENCOUNTER — Ambulatory Visit: Payer: 59 | Admitting: Family Medicine

## 2021-05-20 VITALS — BP 105/73 | HR 73 | Temp 97.8°F | Ht 61.0 in | Wt 134.0 lb

## 2021-05-20 DIAGNOSIS — M25551 Pain in right hip: Secondary | ICD-10-CM

## 2021-05-20 DIAGNOSIS — R6884 Jaw pain: Secondary | ICD-10-CM

## 2021-05-20 MED ORDER — METHOCARBAMOL 750 MG PO TABS: 750.0000 mg | ORAL_TABLET | Freq: Four times a day (QID) | ORAL | 0 refills | Status: DC

## 2021-05-20 MED FILL — Ubrogepant Tab 100 MG: ORAL | 30 days supply | Qty: 10 | Fill #7 | Status: AC

## 2021-05-20 NOTE — Progress Notes (Signed)
Acute Office Visit  Subjective:    Patient ID: Erica Rodriguez, female    DOB: 1985-08-15, 35 y.o.   MRN: [id number]  Chief Complaint  Patient presents with   Jaw Pain   Hip Pain    HPI Patient is in today for jaw pain x 1 week on bother side. She felt it lock up last week and has been sore since. Prior to this she had a lot of popping. She continues to have popping. She has tried flexeril and oxycodone. She has been taking tylenol without improvement. She recently had a PRP injection for plantar fascitis and is unable to take NSAIDs for the time being.  She also reports pain in her right glut that radiates to her right lateral hip for about 1 week.It is mostly when abducting her hip. The pain is intermittent. She denies injury, back pain, numbness, tingling, or changes in bowel or bladder control.   Past Medical History:  Diagnosis Date   Depression    Headache    migraines   Mild preeclampsia     Past Surgical History:  Procedure Laterality Date   CARPAL TUNNEL RELEASE  2021   CESAREAN SECTION     FOOT SURGERY Bilateral    PLANTAR FASCIA RELEASE  2021   ULNAR NERVE REPAIR     WISDOM TOOTH EXTRACTION      Family History  Problem Relation Age of Onset   Thyroid disease Mother    Hyperlipidemia Mother    Arthritis/Rheumatoid Mother    Diabetes Mother    Hypertension Father    Kidney disease Father    Cirrhosis Father    Liver cancer Father    Diabetes Maternal Grandmother    Stroke Maternal Grandmother    Breast cancer Maternal Grandmother    Stroke Maternal Grandfather    Healthy Son     Social History   Socioeconomic History   Marital status: Married    Spouse name: Legrand Como   Number of children: 1   Years of education: 12   Highest education level: Not on file  Occupational History    Comment: clincal registration  Tobacco Use   Smoking status: Never   Smokeless tobacco: Never  Vaping Use   Vaping Use: Never used  Substance and Sexual Activity    Alcohol use: No   Drug use: No   Sexual activity: Yes  Other Topics Concern   Not on file  Social History Narrative   Lives at home with husband and son   Drinks 1 soda a day    Social Determinants of Health   Financial Resource Strain: Not on file  Food Insecurity: Not on file  Transportation Needs: Not on file  Physical Activity: Not on file  Stress: Not on file  Social Connections: Not on file  Intimate Partner Violence: Not on file    Outpatient Medications Prior to Visit  Medication Sig Dispense Refill   amoxicillin-clavulanate (AUGMENTIN) 875-125 MG tablet Take 1 tablet by mouth 2 (two) times daily. 14 tablet 0   buPROPion (WELLBUTRIN XL) 150 MG 24 hr tablet Take 1 tablet (150 mg total) by mouth daily. 90 tablet 1   clindamycin-benzoyl peroxide (BENZACLIN) gel Apply topically 2 (two) times daily. (Patient taking differently: Apply topically as needed.) 25 g 0   cyanocobalamin (,VITAMIN B-12,) 1000 MCG/ML injection Inject 1 mL (1,000 mcg total) into the muscle every 30 days 3 mL 1   cyclobenzaprine (FLEXERIL) 5 MG tablet Take 1 tablet (5  mg total) by mouth 3 (three) times daily as needed for muscle spasms 60 tablet 0   gabapentin (NEURONTIN) 300 MG capsule Take 1 capsule (300 mg total) by mouth 3 (three) times daily. 90 capsule 0   ketorolac (TORADOL) 60 MG/2ML SOLN injection INJECT 2ML (60MG) INTO THE MUSCLE AT ONSET OF MIGRAINE. MAY REPEAT IN 6 HOURS. MAX TWICE A DAY AND 4 DAYS PER MONTH. 10 mL 0   MIRENA, 52 MG, 20 MCG/24HR IUD      OnabotulinumtoxinA (BOTOX IM) Inject into the muscle. 155units IM to head and neck every 3 months for migraines.     ondansetron (ZOFRAN ODT) 4 MG disintegrating tablet Take 1-2 tablets (4-8 mg total) by mouth every 8 (eight) hours as needed for nausea or vomiting. 30 tablet 11   propranolol ER (INDERAL LA) 80 MG 24 hr capsule Take 1 capsule (80 mg total) by mouth daily. 90 capsule 3   Rimegepant Sulfate (NURTEC) 75 MG TBDP Take 1 tablet (75 mg)  by mouth once daily as needed for up to 1 dose. Try to take as soon as possible for migraine. 8 tablet 11   topiramate (TOPAMAX) 100 MG tablet Take 1 tablet (100 mg total) by mouth 2 (two) times daily. 180 tablet 3   Ubrogepant 100 MG TABS TAKE 1 TABLET (100 MG) BY MOUTH AT ONSET OF HEADACHE, MAY REPEAT 1 TABLET IN 2 HOURS, NO MORE THAN 2 TABLETS IN 24 HOURS 10 tablet 11   zolpidem (AMBIEN) 10 MG tablet TAKE 1 TABLET (10 MG TOTAL) BY MOUTH AT BEDTIME AS NEEDED FOR SLEEP. 30 tablet 4   Facility-Administered Medications Prior to Visit  Medication Dose Route Frequency Provider Last Rate Last Admin   cyanocobalamin ((VITAMIN B-12)) injection 1,000 mcg  1,000 mcg Intramuscular Q30 days Chevis Pretty, FNP   1,000 mcg at 05/26/19 1035    No Known Allergies  Review of Systems As per HPI.     Objective:    Physical Exam Nursing note reviewed.  Constitutional:      General: She is not in acute distress.    Appearance: She is not ill-appearing, toxic-appearing or diaphoretic.  Pulmonary:     Effort: Pulmonary effort is normal. No respiratory distress.  Musculoskeletal:     Lumbar back: No swelling, edema, deformity, spasms, tenderness or bony tenderness. Negative right straight leg raise test.     Right hip: No deformity, lacerations, tenderness or bony tenderness. Normal range of motion. Normal strength.     Right lower leg: No edema.     Left lower leg: No edema.     Comments: Right gluteal tenderness  Skin:    General: Skin is dry.  Neurological:     General: No focal deficit present.     Mental Status: She is alert and oriented to person, place, and time.  Psychiatric:        Mood and Affect: Mood normal.        Behavior: Behavior normal.    There were no vitals taken for this visit. Wt Readings from Last 3 Encounters:  02/19/21 141 lb (64 kg)  01/25/21 140 lb (63.5 kg)  12/25/20 142 lb 3.2 oz (64.5 kg)    Health Maintenance Due  Topic Date Due   PAP SMEAR-Modifier   07/24/2017   COVID-19 Vaccine (3 - Booster for Pfizer series) 06/13/2020   TETANUS/TDAP  07/07/2020   INFLUENZA VACCINE  02/04/2021    There are no preventive care reminders to display for this  patient.   Lab Results  Component Value Date   TSH 1.470 01/06/2020   Lab Results  Component Value Date   WBC 7.4 02/28/2021   HGB 13.2 02/28/2021   HCT 38.7 02/28/2021   MCV 96 02/28/2021   PLT 476 (H) 02/28/2021   Lab Results  Component Value Date   NA 141 01/24/2021   K 4.2 01/24/2021   CO2 19 (L) 01/24/2021   GLUCOSE 110 (H) 01/24/2021   BUN 12 01/24/2021   CREATININE 0.76 01/24/2021   BILITOT 0.3 01/24/2021   ALKPHOS 96 01/24/2021   AST 10 01/24/2021   ALT 9 01/24/2021   PROT 7.6 01/24/2021   ALBUMIN 4.8 01/24/2021   CALCIUM 9.7 01/24/2021   EGFR 105 01/24/2021   Lab Results  Component Value Date   CHOL 172 01/24/2021   Lab Results  Component Value Date   HDL 52 01/24/2021   Lab Results  Component Value Date   LDLCALC 110 (H) 01/24/2021   Lab Results  Component Value Date   TRIG 49 01/24/2021   Lab Results  Component Value Date   CHOLHDL 3.3 01/24/2021   Lab Results  Component Value Date   HGBA1C 5.2 08/12/2016       Assessment & Plan:   Cecila was seen today for jaw pain and hip pain.  Diagnoses and all orders for this visit:  Jaw pain Will try robaxin instead of flexeril. Unable to take NSAIDs due to recent PRP injection. Tylenol, heat, ice, stretching.  -     methocarbamol (ROBAXIN-750) 750 MG tablet; Take 1 tablet (750 mg total) by mouth 4 (four) times daily.  Right hip pain Right gluteal tenderness. Robaxin as below. Tylenol, heat, ice, stretching.  -     methocarbamol (ROBAXIN-750) 750 MG tablet; Take 1 tablet (750 mg total) by mouth 4 (four) times daily.  Return to office for new or worsening symptoms, or if symptoms persist.   The patient indicates understanding of these issues and agrees with the plan.  Gwenlyn Perking, FNP

## 2021-05-20 NOTE — Patient Instructions (Signed)
Jaw Range of Motion Exercises Jaw range of motion exercises are exercises that help your jaw move better. Exercises that help you have good posture (postural exercises) also help relieve jaw discomfort. These are often done along with range of motion exercises. These exercises can help prevent or improve: Trouble opening your mouth. Pain in your jaw while it is open or closed. Temporomandibular joint (TMJ) pain. Headache caused by jaw tension. Take other actions to prevent or relieve jaw pain, such as: Avoiding things that cause or increase jaw pain. This may include: Chewing gum or eating hard foods. Clenching your jaw or teeth, grinding your teeth, or keeping tension in your jaw muscles. Opening your mouth wide, such as for a big yawn. Leaning on your jaw, such as resting your jaw in your hand while leaning on a desk. Putting ice on your jaw. To do this: Put ice in a plastic bag. Place a towel between your skin and the bag. Leave the ice on for 20 minutes, 2-3 times a day. Remove the ice if your skin turns bright red. This is very important. If you cannot feel pain, heat, or cold, you have a greater risk of damage to the area. Only do jaw exercises that your health care provider recommends. Only move your jaw as far as it can comfortably go in each direction. Do not move your jaw into positions that cause pain. Range of motion exercises Repeat each of these exercises 8 times, 1-2 times a day, or as told by your health care provider. Exercise A: Forward protrusion  Push your jaw forward. Hold this position for 1-2 seconds. Let your jaw return to its normal position and rest it there for 1-2 seconds. Exercise B: Controlled opening  Stand or sit in front of a mirror. Place your tongue on the roof of your mouth, just behind your top teeth. Keeping your tongue on the roof of your mouth, slowly open and close your mouth. While you open and close your mouth, watch your jaw in the mirror. Try  to keep your jaw from moving to one side or the other. Exercise C: Right and left motion  Move your jaw right. Hold this position for 1-2 seconds. Let your jaw return to its normal position, and rest it there for 1-2 seconds. Move your jaw left. Hold this position for 1-2 seconds. Let your jaw return to its normal position, and rest it there for 1-2 seconds. Postural exercises Exercise A: Chin tucks  You can do this exercise sitting, standing, or lying down. Move your head straight back, keeping your head level. You can guide the movement by placing your fingers on your chin to push your jaw back in an even motion. You should be able to feel a double chin form at the end of the motion. Hold this position for 5 seconds. Repeat 10-15 times. Exercise B: Shoulder blade squeeze  Sit or stand. Bend your elbows to about 90 degrees, which is the shape of a capital letter "L." Keep your upper arms by your body. Squeeze your shoulder blades down and back, as though you were trying to touch your elbows behind you. Do not shrug your shoulders or move your head. Hold this position for 5 seconds. Repeat 10-15 times. Exercise C: Chest stretch  Stand in a doorway with one of your feet slightly in front of the other. This is called a staggered stance. If you cannot reach your forearms to the door frame, do this exercise in   a corner of a room. Put both of your hands and your forearms on the door frame, with your arms wide apart. Make sure your arms are at a 90-degree angle to your body. Place your hands on the door frame at the height of your elbows. Slowly move your weight onto your front foot until you feel a stretch across your chest and in the front of your shoulders. Keep your head and chest upright and keep your abdominal muscles tight. Do not lean in. Hold this position for 30 seconds. Repeat 3 times. Contact a health care provider if: You have jaw pain that is new or gets worse. You have clicking or  popping sounds while doing the exercises. Get help right away if: Your jaw is stuck in one place and you cannot move it. You cannot open or close your mouth. Summary Jaw range of motion exercises are exercises that help your jaw move better. Take actions to prevent or relieve jaw pain: limit chewing gum or eating hard foods; clenching your jaw or teeth; or leaning on your jaw, such as resting your jaw in your hand while leaning on a desk. Repeat each of the jaw range of motion exercises 8 times, 1-2 times a day, or as told by your health care provider. Contact a health care provider if you have clicking or popping sounds while doing the exercises. This information is not intended to replace advice given to you by your health care provider. Make sure you discuss any questions you have with your health care provider. Document Revised: 02/03/2021 Document Reviewed: 02/03/2021 Elsevier Patient Education  2022 [employer].  

## 2021-05-21 ENCOUNTER — Other Ambulatory Visit (HOSPITAL_COMMUNITY): Payer: Self-pay

## 2021-05-23 ENCOUNTER — Encounter: Payer: Self-pay | Admitting: Podiatry

## 2021-05-24 ENCOUNTER — Telehealth: Payer: Self-pay

## 2021-05-24 NOTE — Telephone Encounter (Signed)
Patient notified and verbalized understanding. Appt made per patients request

## 2021-05-24 NOTE — Telephone Encounter (Signed)
Patient saw Tiffany for TMJ. She was given Robaxin because her flexeril was not working. She has had it for 2 weeks and there is no improvement. Is there anything else she can try?

## 2021-05-24 NOTE — Telephone Encounter (Signed)
May need valium but has to be seen for controlled meds

## 2021-05-27 ENCOUNTER — Other Ambulatory Visit (HOSPITAL_COMMUNITY): Payer: Self-pay

## 2021-05-28 ENCOUNTER — Encounter: Payer: Self-pay | Admitting: Nurse Practitioner

## 2021-05-28 ENCOUNTER — Ambulatory Visit: Payer: 59 | Admitting: Nurse Practitioner

## 2021-05-28 ENCOUNTER — Other Ambulatory Visit: Payer: Self-pay

## 2021-05-28 ENCOUNTER — Other Ambulatory Visit (HOSPITAL_COMMUNITY): Payer: Self-pay

## 2021-05-28 VITALS — BP 108/75 | HR 80 | Temp 98.4°F | Resp 20 | Ht 61.0 in | Wt 133.0 lb

## 2021-05-28 DIAGNOSIS — Z23 Encounter for immunization: Secondary | ICD-10-CM | POA: Diagnosis not present

## 2021-05-28 DIAGNOSIS — M26609 Unspecified temporomandibular joint disorder, unspecified side: Secondary | ICD-10-CM | POA: Diagnosis not present

## 2021-05-28 MED ORDER — DIAZEPAM 5 MG PO TABS
5.0000 mg | ORAL_TABLET | Freq: Two times a day (BID) | ORAL | 1 refills | Status: DC | PRN
Start: 2021-05-28 — End: 2023-04-28
  Filled 2021-05-28: qty 60, 30d supply, fill #0
  Filled 2021-08-19: qty 60, 30d supply, fill #1

## 2021-05-28 NOTE — Patient Instructions (Signed)
Temporomandibular Joint Syndrome °Temporomandibular joint syndrome (TMJ syndrome) is a condition that causes pain in the temporomandibular joints. These joints are located near your ears and allow your jaw to open and close. For people with TMJ syndrome, chewing, biting, or other movements of the jaw can be difficult or painful. °TMJ syndrome is often mild and goes away within a few weeks. However, sometimes the condition becomes a long-term (chronic) problem. °What are the causes? °This condition may be caused by: °Grinding your teeth or clenching your jaw. Some people do this when they are stressed. °Arthritis. °An injury to the jaw. °A head or neck injury. °Teeth or dentures that are not aligned well. °In some cases, the cause of TMJ syndrome may not be known. °What are the signs or symptoms? °The most common symptom of this condition is aching pain on the side of the head in the area of the TMJ. Other symptoms may include: °Pain when moving your jaw, such as when chewing or biting. °Not being able to open your jaw all the way. °Making a clicking sound when you open your mouth. °Headache. °Earache. °Neck or shoulder pain. °How is this diagnosed? °This condition may be diagnosed based on: °Your symptoms and medical history. °A physical exam. Your health care provider may check the range of motion of your jaw. °Imaging tests, such as X-rays or an MRI. °You may also need to see your dentist, who will check if your teeth and jaw are lined up correctly. °How is this treated? °TMJ syndrome often goes away on its own. If treatment is needed, it may include: °Eating soft foods and applying ice or heat. °Medicines to relieve pain or inflammation. °Medicines or massage to relax the muscles. °A splint, bite plate, or mouthpiece to prevent teeth grinding or jaw clenching. °Relaxation techniques or counseling to help reduce stress. °A therapy for pain in which an electrical current is applied to the nerves through the skin  (transcutaneous electrical nerve stimulation). °Acupuncture. This may help to relieve pain. °Jaw surgery. This is rarely needed. °Follow these instructions at home: °Eating and drinking °Eat a soft diet if you are having trouble chewing. °Avoid foods that require a lot of chewing. Do not chew gum. °General instructions °Take over-the-counter and prescription medicines only as told by your health care provider. °If directed, put ice on the painful area. To do this: °Put ice in a plastic bag. °Place a towel between your skin and the bag. °Leave the ice on for 20 minutes, 2-3 times a day. °Remove the ice if your skin turns bright red. This is very important. If you cannot feel pain, heat, or cold, you have a greater risk of damage to the area. °Apply a warm, wet cloth (warm compress) to the painful area as told. °Massage your jaw area and do any jaw stretching exercises as told by your health care provider. °If you were given a splint, bite plate, or mouthpiece, wear it as told by your health care provider. °Keep all follow-up visits. This is important. °Where to find more information °National [employer]: www.nidcr.nih.gov °Contact a health care provider if: °You have trouble eating. °You have new or worsening symptoms. °Get help right away if: °Your jaw locks. °Summary °Temporomandibular joint syndrome (TMJ syndrome) is a condition that causes pain in the temporomandibular joints. These joints are located near your ears and allow your jaw to open and close. °TMJ syndrome is often mild and goes away within a few   weeks. However, sometimes the condition becomes a long-term (chronic) problem. °Symptoms include an aching pain on the side of the head in the area of the TMJ, pain when chewing or biting, and being unable to open your jaw all the way. You may also make a clicking sound when you open your mouth. °TMJ syndrome often goes away on its own. If treatment is needed, it may include  medicines to relieve pain, reduce inflammation, or relax the muscles. A splint, bite plate, or mouthpiece may also be used to prevent teeth grinding or jaw clenching. °This information is not intended to replace advice given to you by your health care provider. Make sure you discuss any questions you have with your health care provider. °Document Revised: 02/03/2021 Document Reviewed: 02/03/2021 °Elsevier Patient Education © 2022 Else[employer] Inc. ° °

## 2021-05-28 NOTE — Progress Notes (Signed)
   Subjective:    Patient ID: Erica Rodriguez, female    DOB: March 20, 1986, 35 y.o.   MRN: [id number]   Chief Complaint: Discuss valium for TMJ   HPI Patient has TMJ and it has really been bothering her. She cannot do surgery route right now. She says she doe snot grit her teeth. She says 2 weeks ago her jaws locked up and that is when pain started. Rate [place] 8/10 right now.yawning and chewing increases pain. She cannot take ibuprofen right  now. Muscles relaxer and tylenol have not helped.    Review of Systems  Constitutional:  Negative for diaphoresis.  Eyes:  Negative for pain.  Respiratory:  Negative for shortness of breath.   Cardiovascular:  Negative for chest pain, palpitations and leg swelling.  Gastrointestinal:  Negative for abdominal pain.  Endocrine: Negative for polydipsia.  Skin:  Negative for rash.  Neurological:  Negative for dizziness, weakness and headaches.  Hematological:  Does not bruise/bleed easily.  All other systems reviewed and are negative.     Objective:   Physical Exam Constitutional:      Appearance: Normal appearance.  HENT:     Mouth/Throat:     Comments: Jaw pops bil with opening and closing of  mouth Cardiovascular:     Rate and Rhythm: Normal rate and regular rhythm.     Heart sounds: Normal heart sounds.  Pulmonary:     Effort: Pulmonary effort is normal.     Breath sounds: Normal breath sounds.  Neurological:     General: No focal deficit present.     Mental Status: She is alert and oriented to person, place, and time.  Psychiatric:        Mood and Affect: Mood normal.        Behavior: Behavior normal.    BP 108/75   Pulse 80   Temp 98.4 F (36.9 C) (Temporal)   Resp 20   Ht 5\' 1"  (1.549 m)   Wt 133 lb (60.3 kg)   SpO2 100%   BMI 25.13 kg/m        Assessment & Plan:   in today with chief complaint of Discuss valium for TMJ   1. TMJ (temporomandibular joint disorder) Avoid eating foods that require a lot of  chewing Try to rest jaws. RTO prn - diazepam (VALIUM) 5 MG tablet; Take 1 tablet (5 mg total) by mouth every 12 (twelve) hours as needed for anxiety.  Dispense: 60 tablet; Refill: 1    The above assessment and management plan was discussed with the patient. The patient verbalized understanding of and has agreed to the management plan. Patient is aware to call the clinic if symptoms persist or worsen. Patient is aware when to return to the clinic for a follow-up visit. Patient educated on when it is appropriate to go to the emergency department.   [name] [name], FNP  '

## 2021-05-28 NOTE — Addendum Note (Signed)
Addended by: Cleda Daub on: 05/28/2021 02:56 PM   Modules accepted: Orders

## 2021-06-03 ENCOUNTER — Ambulatory Visit: Payer: 59 | Admitting: Family Medicine

## 2021-06-03 DIAGNOSIS — G43711 Chronic migraine without aura, intractable, with status migrainosus: Secondary | ICD-10-CM | POA: Diagnosis not present

## 2021-06-03 NOTE — Progress Notes (Signed)
06/03/2021 ALL: Erica Rodriguez returns for Botox. She is doing well. She continues propranolol, topiramate and Ubrelvy. She doe snot usually have migraines until Botox wears off. She has been having more trouble with TMJ. Masseter injections have been very helpful but she is aware that we will only do Botox protocol today due to insurance requirements.   02/19/2021 ALL: She continues Botox, propranolol 80mg  daily, topiramate 100mg  BID and Ubrelvy. She rarely needs Toradol. She does use cyclobenzaprine for muscle tension leading to headaches. She feels that she is doing really well. May have 1-2 migraines per month, on average. She reports not taking propranolol while on vacation this summer and felt terribly. She feels that LS injection helps with right muscle tension. She has had masseter injections in the past that help with clenching.   11/19/2020 ALL: She feels Botox works very well for about 2.5 months then headaches return. We increased propranolol in 08/2020 to 80mg  ER daily. She reports this has helped with headaches. She continues topiramate 100mg  BID and Ubrelvy 100mg  PRN. She continues to have increased anxiety and feels stress contributes to headaches. Vibryyd worsened headaches. She was recently started on Wellbutrin. Toradol used sparingly for intractable headaches. She has had more tension in right shoulder/upper back.   08/09/2020 ALL: She is doing well, today. She does feel headaches have been a little more frequent over the past two weeks but feels that recent illness and being off schedule with Botox has played a role. She continues to have about 10 migraines every month. She is considering increasing propranolol. Will wait to see what happens with this procedure. She continues topiramate and Nurtec/Ubrelvy.   04/23/2020 ALL: She is doing well. Botox is helping. She continues topiramate 100mg  BID and propranolol 60mg  daily. She alternates Nurtec and Ubrelvy. She usually uses Nurtec then will take  if headache continues. She has about 10-12 migraines per month.  Stress is contributor.    Consent Form Botulism Toxin Injection For Chronic Migraine    Reviewed orally with patient, additionally signature is on file:  Botulism toxin has been approved by the Federal drug administration for treatment of chronic migraine. Botulism toxin does not cure chronic migraine and it may not be effective in some patients.  The administration of botulism toxin is accomplished by injecting a small amount of toxin into the muscles of the neck and head. Dosage must be titrated for each individual. Any benefits resulting from botulism toxin tend to wear off after 3 months with a repeat injection required if benefit is to be maintained. Injections are usually done every 3-4 months with maximum effect peak achieved by about 2 or 3 weeks. Botulism toxin is expensive and you should be sure of what costs you will incur resulting from the injection.  The side effects of botulism toxin use for chronic migraine may include:   -Transient, and usually mild, facial weakness with facial injections  -Transient, and usually mild, head or neck weakness with head/neck injections  -Reduction or loss of forehead facial animation due to forehead muscle weakness  -Eyelid drooping  -Dry eye  -Pain at the site of injection or bruising at the site of injection  -Double vision  -Potential unknown long term risks   Contraindications: You should not have Botox if you are pregnant, nursing, allergic to albumin, have an infection, skin condition, or muscle weakness at the site of the injection, or have myasthenia gravis, Lambert-Eaton syndrome, or ALS.  It is also possible that as with  any injection, there may be an allergic reaction or no effect from the medication. Reduced effectiveness after repeated injections is sometimes seen and rarely infection at the injection site may occur. All care will be taken to prevent these  side effects. If therapy is given over a long time, atrophy and wasting in the muscle injected may occur. Occasionally the patient's become refractory to treatment because they develop antibodies to the toxin. In this event, therapy needs to be modified.  I have read the above information and consent to the administration of botulism toxin.    BOTOX PROCEDURE NOTE FOR MIGRAINE HEADACHE  Contraindications and precautions discussed with patient(above). Aseptic procedure was observed and patient tolerated procedure. Procedure performed by Shawnie Dapper, FNP-C.   The condition has existed for more than 6 months, and pt does not have a diagnosis of ALS, Myasthenia Gravis or Lambert-Eaton Syndrome.  Risks and benefits of injections discussed and pt agrees to proceed with the procedure.  Written consent obtained  These injections are medically necessary. Pt  receives good benefits from these injections. These injections do not cause sedations or hallucinations which the oral therapies may cause.   Description of procedure:  The patient was placed in a sitting position. The standard protocol was used for Botox as follows, with 5 units of Botox injected at each site:  -Procerus muscle, midline injection  -Corrugator muscle, bilateral injection  -Frontalis muscle, bilateral injection, with 2 sites each side, medial injection was performed in the upper one third of the frontalis muscle, in the region vertical from the medial inferior edge of the superior orbital rim. The lateral injection was again in the upper one third of the forehead vertically above the lateral limbus of the cornea, 1.5 cm lateral to the medial injection site.  -Temporalis muscle injection, 4 sites, bilaterally. The first injection was 3 cm above the tragus of the ear, second injection site was 1.5 cm to 3 cm up from the first injection site in line with the tragus of the ear. The third injection site was 1.5-3 cm forward between the  first 2 injection sites. The fourth injection site was 1.5 cm posterior to the second injection site. 5th site laterally in the temporalis  muscleat the level of the outer canthus.  -Occipitalis muscle injection, 3 sites, bilaterally. The first injection was done one half way between the occipital protuberance and the tip of the mastoid process behind the ear. The second injection site was done lateral and superior to the first, 1 fingerbreadth from the first injection. The third injection site was 1 fingerbreadth superiorly and medially from the first injection site.  -Cervical paraspinal muscle injection, 2 sites, bilaterally. The first injection site was 1 cm from the midline of the cervical spine, 3 cm inferior to the lower border of the occipital protuberance. The second injection site was 1.5 cm superiorly and laterally to the first injection site.  -Trapezius muscle injection was performed at 3 sites, bilaterally. The first injection site was in the upper trapezius muscle halfway between the inflection point of the neck, and the acromion. The second injection site was one half way between the acromion and the first injection site. The third injection was done between the first injection site and the inflection point of the neck.   Will return for repeat injection in 3 months.   A total of 200 units of Botox was prepared, 155 units of Botox was injected as documented above, 45 units of Botox was wasted.  The patient tolerated the procedure well, there were no complications of the above procedure.

## 2021-06-03 NOTE — Progress Notes (Signed)
Botox- 200 units x 1 vial Lot: [id number] Expiration: 05/25 NDC: 0037-0488-89  0.9% Sodium Chloride- 6mL total Lot: [id number] Expiration: 07/07/22 NDC: 3888-2800-34  Dx: J17.915 B/B

## 2021-06-04 ENCOUNTER — Other Ambulatory Visit (HOSPITAL_COMMUNITY): Payer: Self-pay

## 2021-06-04 MED ORDER — GABAPENTIN 300 MG PO CAPS
300.0000 mg | ORAL_CAPSULE | Freq: Three times a day (TID) | ORAL | 0 refills | Status: DC
  Filled 2021-06-04: qty 90, 30d supply, fill #0

## 2021-06-11 ENCOUNTER — Encounter: Payer: Self-pay | Admitting: Podiatry

## 2021-06-12 ENCOUNTER — Encounter: Payer: Self-pay | Admitting: Podiatry

## 2021-06-12 NOTE — Telephone Encounter (Signed)
Please schedule per Dr Allena Katz

## 2021-06-13 ENCOUNTER — Telehealth: Payer: 59 | Admitting: Physician Assistant

## 2021-06-13 DIAGNOSIS — T3695XA Adverse effect of unspecified systemic antibiotic, initial encounter: Secondary | ICD-10-CM

## 2021-06-13 DIAGNOSIS — B379 Candidiasis, unspecified: Secondary | ICD-10-CM | POA: Diagnosis not present

## 2021-06-13 MED ORDER — FLUCONAZOLE 150 MG PO TABS: 150.0000 mg | ORAL_TABLET | Freq: Once | ORAL | 0 refills | Status: AC

## 2021-06-13 NOTE — Progress Notes (Signed)

## 2021-06-14 ENCOUNTER — Other Ambulatory Visit: Payer: 59

## 2021-06-18 ENCOUNTER — Other Ambulatory Visit: Payer: Self-pay | Admitting: Nurse Practitioner

## 2021-06-18 ENCOUNTER — Other Ambulatory Visit (HOSPITAL_COMMUNITY): Payer: Self-pay

## 2021-06-18 DIAGNOSIS — E538 Deficiency of other specified B group vitamins: Secondary | ICD-10-CM

## 2021-06-18 MED ORDER — CYANOCOBALAMIN 1000 MCG/ML IJ SOLN
1000.0000 ug | INTRAMUSCULAR | 1 refills | Status: DC
  Filled 2021-06-18: qty 3, 90d supply, fill #0
  Filled 2021-09-17: qty 3, 90d supply, fill #1

## 2021-06-19 ENCOUNTER — Other Ambulatory Visit (HOSPITAL_COMMUNITY): Payer: Self-pay

## 2021-06-19 ENCOUNTER — Other Ambulatory Visit: Payer: 59

## 2021-06-19 ENCOUNTER — Other Ambulatory Visit: Payer: Self-pay

## 2021-06-19 MED FILL — Ubrogepant Tab 100 MG: ORAL | 30 days supply | Qty: 10 | Fill #8 | Status: AC

## 2021-06-20 ENCOUNTER — Other Ambulatory Visit (HOSPITAL_COMMUNITY): Payer: Self-pay

## 2021-06-20 DIAGNOSIS — G5602 Carpal tunnel syndrome, left upper limb: Secondary | ICD-10-CM | POA: Diagnosis not present

## 2021-06-20 DIAGNOSIS — G5621 Lesion of ulnar nerve, right upper limb: Secondary | ICD-10-CM | POA: Diagnosis not present

## 2021-06-20 DIAGNOSIS — M65312 Trigger thumb, left thumb: Secondary | ICD-10-CM | POA: Diagnosis not present

## 2021-06-21 ENCOUNTER — Other Ambulatory Visit (HOSPITAL_COMMUNITY): Payer: Self-pay

## 2021-06-21 ENCOUNTER — Encounter: Payer: Self-pay | Admitting: Nurse Practitioner

## 2021-06-21 ENCOUNTER — Other Ambulatory Visit: Payer: Self-pay

## 2021-06-21 ENCOUNTER — Ambulatory Visit: Payer: 59 | Admitting: Nurse Practitioner

## 2021-06-21 VITALS — BP 107/74 | HR 71 | Temp 97.4°F | Resp 20 | Ht 61.0 in | Wt 132.0 lb

## 2021-06-21 DIAGNOSIS — M26623 Arthralgia of bilateral temporomandibular joint: Secondary | ICD-10-CM

## 2021-06-21 DIAGNOSIS — G43709 Chronic migraine without aura, not intractable, without status migrainosus: Secondary | ICD-10-CM | POA: Diagnosis not present

## 2021-06-21 DIAGNOSIS — F5101 Primary insomnia: Secondary | ICD-10-CM

## 2021-06-21 DIAGNOSIS — F411 Generalized anxiety disorder: Secondary | ICD-10-CM

## 2021-06-21 DIAGNOSIS — R200 Anesthesia of skin: Secondary | ICD-10-CM | POA: Diagnosis not present

## 2021-06-21 MED ORDER — TOPIRAMATE 100 MG PO TABS
100.0000 mg | ORAL_TABLET | Freq: Two times a day (BID) | ORAL | 3 refills | Status: DC
  Filled 2021-06-21 – 2021-08-21 (×2): qty 180, 90d supply, fill #0
  Filled 2021-11-20: qty 180, 90d supply, fill #1
  Filled 2022-02-11: qty 180, 90d supply, fill #2
  Filled 2022-05-12: qty 180, 90d supply, fill #3

## 2021-06-21 MED ORDER — PROPRANOLOL HCL ER 80 MG PO CP24
80.0000 mg | ORAL_CAPSULE | Freq: Every day | ORAL | 3 refills | Status: DC
  Filled 2021-06-21 – 2021-08-19 (×2): qty 90, 90d supply, fill #0
  Filled 2021-11-18: qty 90, 90d supply, fill #1
  Filled 2022-02-24: qty 90, 90d supply, fill #2
  Filled 2022-06-21: qty 90, 90d supply, fill #3

## 2021-06-21 MED ORDER — ZOLPIDEM TARTRATE 10 MG PO TABS
10.0000 mg | ORAL_TABLET | Freq: Every evening | ORAL | 5 refills | Status: DC | PRN
  Filled 2021-06-21: qty 30, 30d supply, fill #0
  Filled 2021-07-22: qty 30, 30d supply, fill #1
  Filled 2021-08-21: qty 30, 30d supply, fill #2
  Filled 2021-09-17: qty 30, 30d supply, fill #3
  Filled 2021-10-16: qty 30, 30d supply, fill #4
  Filled 2021-11-13: qty 30, 30d supply, fill #5

## 2021-06-21 MED ORDER — BUPROPION HCL ER (XL) 300 MG PO TB24
300.0000 mg | ORAL_TABLET | Freq: Every day | ORAL | 1 refills | Status: DC
  Filled 2021-06-21 – 2021-07-22 (×2): qty 90, 90d supply, fill #0
  Filled 2021-10-14: qty 90, 90d supply, fill #1

## 2021-06-21 MED ORDER — GABAPENTIN 300 MG PO CAPS
300.0000 mg | ORAL_CAPSULE | Freq: Three times a day (TID) | ORAL | 5 refills | Status: DC
  Filled 2021-06-21 – 2021-09-05 (×2): qty 90, 30d supply, fill #0
  Filled 2021-11-04: qty 90, 30d supply, fill #1

## 2021-06-21 NOTE — Progress Notes (Signed)
Subjective:    Patient ID: Erica Rodriguez, female    DOB: 03-29-1986, 35 y.o.   MRN: [id number]   Chief Complaint: Medical Management of Chronic Issues    HPI:  1. Chronic migraine without aura without status migrainosus, not intractable Still has but are only happening 4 x a month. Stress seems to increase frequency. Is on nurtec and inderal for prevention. Urbrevely helps when has headache.   2. GAD (generalized anxiety disorder) Is on wellbutrin and that is working well for her GAD 7 : Generalized Anxiety Score 06/21/2021 05/28/2021 01/25/2021 10/05/2020  Nervous, Anxious, on Edge 0 1 2 2   Control/stop worrying 1 0 2 1  Worry too much - different things 1 0 2 1  Trouble relaxing 1 0 1 1  Restless 0 0 0 0  Easily annoyed or irritable 3 1 2 1   Afraid - awful might happen 0 0 0 0  Total GAD 7 Score 6 2 9 6   Anxiety Difficulty Not difficult at all Not difficult at all Not difficult at all Not difficult at all      3. Primary insomnia Is on ambien at night to sleep. Sleeps about 6-7 hours a night  4. Bil hand numbness Is on neurontin 3x a day which helps some with tingling.  5. TMJ She takes valium just as needed. Has not needed lately  Outpatient Encounter Medications as of 06/21/2021  Medication Sig   buPROPion (WELLBUTRIN XL) 150 MG 24 hr tablet Take 1 tablet (150 mg total) by mouth daily.   cyanocobalamin (,VITAMIN B-12,) 1000 MCG/ML injection Inject 1 mL (1,000 mcg total) into the muscle every 30 days   cyclobenzaprine (FLEXERIL) 5 MG tablet Take 1 tablet (5 mg total) by mouth 3 (three) times daily as needed for muscle spasms   diazepam (VALIUM) 5 MG tablet Take 1 tablet (5 mg total) by mouth every 12 (twelve) hours as needed for anxiety.   gabapentin (NEURONTIN) 300 MG capsule Take 1 capsule (300 mg total) by mouth 3 (three) times daily.   ketorolac (TORADOL) 60 MG/2ML SOLN injection INJECT (60MG ) INTO THE MUSCLE AT ONSET OF MIGRAINE. MAY REPEAT IN 6 HOURS. MAX TWICE  A DAY AND 4 DAYS PER MONTH.   MIRENA, 52 MG, 20 MCG/24HR IUD    OnabotulinumtoxinA (BOTOX IM) Inject into the muscle. 155units IM to head and neck every 3 months for migraines.   ondansetron (ZOFRAN ODT) 4 MG disintegrating tablet Take 1-2 tablets (4-8 mg total) by mouth every 8 (eight) hours as needed for nausea or vomiting.   propranolol ER (INDERAL LA) 80 MG 24 hr capsule Take 1 capsule (80 mg total) by mouth daily.   Rimegepant Sulfate (NURTEC) 75 MG TBDP Take 1 tablet (75 mg) by mouth once daily as needed for up to 1 dose. Try to take as soon as possible for migraine.   topiramate (TOPAMAX) 100 MG tablet Take 1 tablet (100 mg total) by mouth 2 (two) times daily.   Ubrogepant 100 MG TABS TAKE 1 TABLET (100 MG) BY MOUTH AT ONSET OF HEADACHE, MAY REPEAT 1 TABLET IN 2 HOURS, NO MORE THAN 2 TABLETS IN 24 HOURS   zolpidem (AMBIEN) 10 MG tablet TAKE 1 TABLET (10 MG TOTAL) BY MOUTH AT BEDTIME AS NEEDED FOR SLEEP.   Facility-Administered Encounter Medications as of 06/21/2021  Medication   cyanocobalamin ((VITAMIN B-12)) injection 1,000 mcg    Past Surgical History:  Procedure Laterality Date   CARPAL TUNNEL RELEASE Right  2021   carpel tunnel - left     CESAREAN SECTION     FOOT SURGERY Bilateral    PLANTAR FASCIA RELEASE  2021   ULNAR NERVE REPAIR     WISDOM TOOTH EXTRACTION      Family History  Problem Relation Age of Onset   Thyroid disease Mother    Hyperlipidemia Mother    Arthritis/Rheumatoid Mother    Diabetes Mother    Hypertension Father    Kidney disease Father    Cirrhosis Father    Liver cancer Father    Diabetes Maternal Grandmother    Stroke Maternal Grandmother    Breast cancer Maternal Grandmother    Stroke Maternal Grandfather    Healthy Son     New complaints: None today  Social history: Lives with her husband and they both live with her mom  Controlled substance contract: 06/21/21     Review of Systems  Constitutional:  Negative for diaphoresis.   Eyes:  Negative for pain.  Respiratory:  Negative for shortness of breath.   Cardiovascular:  Negative for chest pain, palpitations and leg swelling.  Gastrointestinal:  Negative for abdominal pain.  Endocrine: Negative for polydipsia.  Skin:  Negative for rash.  Neurological:  Negative for dizziness, weakness and headaches.  Hematological:  Does not bruise/bleed easily.  All other systems reviewed and are negative.     Objective:   Physical Exam Vitals and nursing note reviewed.  Constitutional:      General: She is not in acute distress.    Appearance: Normal appearance. She is well-developed.  HENT:     Head: Normocephalic.     Right Ear: Tympanic membrane normal.     Left Ear: Tympanic membrane normal.     Nose: Nose normal.     Mouth/Throat:     Mouth: Mucous membranes are moist.  Eyes:     Pupils: Pupils are equal, round, and reactive to light.  Neck:     Vascular: No carotid bruit or JVD.  Cardiovascular:     Rate and Rhythm: Normal rate and regular rhythm.     Heart sounds: Normal heart sounds.  Pulmonary:     Effort: Pulmonary effort is normal. No respiratory distress.     Breath sounds: Normal breath sounds. No wheezing or rales.  Chest:     Chest wall: No tenderness.  Abdominal:     General: Bowel sounds are normal. There is no distension or abdominal bruit.     Palpations: Abdomen is soft. There is no hepatomegaly, splenomegaly, mass or pulsatile mass.     Tenderness: There is no abdominal tenderness.  Musculoskeletal:        General: Normal range of motion.     Cervical back: Normal range of motion and neck supple.  Lymphadenopathy:     Cervical: No cervical adenopathy.  Skin:    General: Skin is warm and dry.  Neurological:     Mental Status: She is alert and oriented to person, place, and time.     Deep Tendon Reflexes: Reflexes are normal and symmetric.  Psychiatric:        Behavior: Behavior normal.        Thought Content: Thought content normal.         Judgment: Judgment normal.    BP 107/74    Pulse 71    Temp (!) 97.4 F (36.3 C)    Resp 20    Ht 5\' 1"  (1.549 m)    Wt 132  lb (59.9 kg)    SpO2 100%    BMI 24.94 kg/m        Assessment & Plan:  Erica Rodriguez comes in today with chief complaint of Medical Management of Chronic Issues   Diagnosis and orders addressed:  1. Chronic migraine without aura without status migrainosus, not intractable Avoid caffeine Keep diary of migraines - propranolol ER (INDERAL LA) 80 MG 24 hr capsule; Take 1 capsule (80 mg total) by mouth daily.  Dispense: 90 capsule; Refill: 3 - topiramate (TOPAMAX) 100 MG tablet; Take 1 tablet (100 mg total) by mouth 2 (two) times daily.  Dispense: 180 tablet; Refill: 3  2. GAD (generalized anxiety disorder) Stress management Increased wellbutrin to 300mg  daily - buPROPion (WELLBUTRIN XL) 300 MG 24 hr tablet; Take 1 tablet (300 mg total) by mouth daily.  Dispense: 90 tablet; Refill: 1  3. Primary insomnia Bedtime routine - zolpidem (AMBIEN) 10 MG tablet; TAKE 1 TABLET (10 MG TOTAL) BY MOUTH AT BEDTIME AS NEEDED FOR SLEEP.  Dispense: 30 tablet; Refill: 5  4. Bilateral hand numbness - gabapentin (NEURONTIN) 300 MG capsule; Take 1 capsule (300 mg total) by mouth 3 (three) times daily.  Dispense: 90 capsule; Refill: 5  5. Bilateral temporomandibular joint pain Uses valium as needed   Labs pending Health Maintenance reviewed Diet and exercise encouraged  Follow up plan: 6 months   [name] , FNP

## 2021-06-21 NOTE — Patient Instructions (Signed)

## 2021-06-21 NOTE — Addendum Note (Signed)
Addended by: Bennie Pierini on: 06/21/2021 04:04 PM   Modules accepted: Orders

## 2021-06-24 ENCOUNTER — Other Ambulatory Visit (HOSPITAL_COMMUNITY): Payer: Self-pay

## 2021-06-24 ENCOUNTER — Other Ambulatory Visit: Payer: Self-pay

## 2021-06-24 DIAGNOSIS — M62838 Other muscle spasm: Secondary | ICD-10-CM

## 2021-06-24 MED ORDER — CYCLOBENZAPRINE HCL 5 MG PO TABS
5.0000 mg | ORAL_TABLET | Freq: Three times a day (TID) | ORAL | 11 refills | Status: DC | PRN
  Filled 2021-06-24: qty 60, 20d supply, fill #0
  Filled 2021-07-22: qty 60, 20d supply, fill #1
  Filled 2021-10-16: qty 60, 20d supply, fill #2

## 2021-06-26 ENCOUNTER — Ambulatory Visit (INDEPENDENT_AMBULATORY_CARE_PROVIDER_SITE_OTHER): Payer: 59 | Admitting: Podiatry

## 2021-06-26 ENCOUNTER — Other Ambulatory Visit: Payer: Self-pay

## 2021-06-26 DIAGNOSIS — M722 Plantar fascial fibromatosis: Secondary | ICD-10-CM

## 2021-06-27 NOTE — Progress Notes (Signed)
PRP injection was delivered in standard technique.  5 cc was injected to bilateral heel with 3 cc in each heel.  No acute complication noted.  Patient tolerated it well.

## 2021-06-30 LAB — TOXASSURE SELECT 13 (MW), URINE

## 2021-07-18 ENCOUNTER — Other Ambulatory Visit (HOSPITAL_COMMUNITY): Payer: Self-pay

## 2021-07-18 ENCOUNTER — Other Ambulatory Visit: Payer: Self-pay | Admitting: Family Medicine

## 2021-07-18 ENCOUNTER — Encounter: Payer: Self-pay | Admitting: Family Medicine

## 2021-07-18 MED ORDER — METHYLPREDNISOLONE 4 MG PO TBPK
ORAL_TABLET | ORAL | 0 refills | Status: DC
  Filled 2021-07-18: qty 21, 6d supply, fill #0

## 2021-07-19 ENCOUNTER — Ambulatory Visit: Payer: 59 | Admitting: Podiatry

## 2021-07-19 DIAGNOSIS — M65312 Trigger thumb, left thumb: Secondary | ICD-10-CM | POA: Diagnosis not present

## 2021-07-19 DIAGNOSIS — G5621 Lesion of ulnar nerve, right upper limb: Secondary | ICD-10-CM | POA: Diagnosis not present

## 2021-07-19 DIAGNOSIS — G5602 Carpal tunnel syndrome, left upper limb: Secondary | ICD-10-CM | POA: Diagnosis not present

## 2021-07-22 ENCOUNTER — Other Ambulatory Visit (HOSPITAL_COMMUNITY): Payer: Self-pay

## 2021-07-22 ENCOUNTER — Other Ambulatory Visit: Payer: Self-pay

## 2021-07-22 MED FILL — Ubrogepant Tab 100 MG: ORAL | 30 days supply | Qty: 10 | Fill #9 | Status: AC

## 2021-07-23 ENCOUNTER — Other Ambulatory Visit: Payer: Self-pay

## 2021-07-23 ENCOUNTER — Other Ambulatory Visit (HOSPITAL_COMMUNITY): Payer: Self-pay

## 2021-07-23 DIAGNOSIS — G43711 Chronic migraine without aura, intractable, with status migrainosus: Secondary | ICD-10-CM

## 2021-07-23 MED ORDER — KETOROLAC TROMETHAMINE 60 MG/2ML IM SOLN
INTRAMUSCULAR | 0 refills | Status: DC
  Filled 2021-07-23: qty 10, 5d supply, fill #0

## 2021-07-24 ENCOUNTER — Ambulatory Visit: Payer: 59 | Admitting: Podiatry

## 2021-07-24 ENCOUNTER — Other Ambulatory Visit: Payer: Self-pay

## 2021-07-24 ENCOUNTER — Telehealth: Payer: Self-pay | Admitting: Neurology

## 2021-07-24 DIAGNOSIS — M722 Plantar fascial fibromatosis: Secondary | ICD-10-CM

## 2021-07-24 NOTE — Telephone Encounter (Signed)
PA completed on CMM/med[employer] KEY: [id number] Will await determination

## 2021-07-24 NOTE — Telephone Encounter (Signed)
The request has been approved. The authorization is effective for a maximum of 12 fills from 07/24/2021 to 07/23/2022, as long as the member is enrolled in their current health plan. The request was approved with a quantity restriction. This has been approved for a quantity limit of 18 with a day supply limit of 30. A written notification letter will follow with additional details.

## 2021-07-24 NOTE — Progress Notes (Signed)
FOLLOW

## 2021-07-25 NOTE — Progress Notes (Signed)
Subjective:  Patient ID: Erica Rodriguez, female    DOB: 1986/01/19,  MRN: [id number]  Chief Complaint  Patient presents with   Follow-up    Follow up plantar fasciitis pain bilateral     DOS: 05/21/2020 Procedure: Right EPF  36 y.o. female returns for post-op check.  Patient presents with follow-up of PRP injection.  She states that she was doing better until she was moving and had to do a lot more on her foot right after the third injection.  She states it may have regressed a little bit  Review of Systems: Negative except as noted in the HPI. Denies N/V/F/Ch.  Past Medical History:  Diagnosis Date   Depression    Headache    migraines   Mild preeclampsia     Current Outpatient Medications:    buPROPion (WELLBUTRIN XL) 300 MG 24 hr tablet, Take 1 tablet (300 mg total) by mouth daily., Disp: 90 tablet, Rfl: 1   cyanocobalamin (,VITAMIN B-12,) 1000 MCG/ML injection, Inject 1 mL (1,000 mcg total) into the muscle every 30 days, Disp: 3 mL, Rfl: 1   cyclobenzaprine (FLEXERIL) 5 MG tablet, Take 1 tablet (5 mg total) by mouth 3 (three) times daily as needed for muscle spasms, Disp: 60 tablet, Rfl: 11   diazepam (VALIUM) 5 MG tablet, Take 1 tablet (5 mg total) by mouth every 12 (twelve) hours as needed for anxiety., Disp: 60 tablet, Rfl: 1   gabapentin (NEURONTIN) 300 MG capsule, Take 1 capsule (300 mg total) by mouth 3 (three) times daily., Disp: 90 capsule, Rfl: 5   ketorolac (TORADOL) 60 MG/2ML SOLN injection, INJECT (60MG ) INTO THE MUSCLE AT ONSET OF MIGRAINE. MAY REPEAT IN 6 HOURS. MAX TWICE A DAY AND 4 DAYS PER MONTH., Disp: 10 mL, Rfl: 0   methylPREDNISolone (MEDROL DOSEPAK) 4 MG TBPK tablet, Taper as directed by packaging for migraine, Disp: 21 each, Rfl: 0   MIRENA, 52 MG, 20 MCG/24HR IUD, , Disp: , Rfl:    OnabotulinumtoxinA (BOTOX IM), Inject into the muscle. 155units IM to head and neck every 3 months for migraines., Disp: , Rfl:    ondansetron (ZOFRAN-ODT) 4 MG  disintegrating tablet, Take 1-2 tablets (4-8 mg total) by mouth every 8 (eight) hours as needed for nausea or vomiting., Disp: 30 tablet, Rfl: 11   propranolol ER (INDERAL LA) 80 MG 24 hr capsule, Take 1 capsule (80 mg total) by mouth daily., Disp: 90 capsule, Rfl: 3   Rimegepant Sulfate (NURTEC) 75 MG TBDP, Take 1 tablet (75 mg) by mouth once daily as needed for up to 1 dose. Try to take as soon as possible for migraine., Disp: 8 tablet, Rfl: 11   topiramate (TOPAMAX) 100 MG tablet, Take 1 tablet (100 mg total) by mouth 2 (two) times daily., Disp: 180 tablet, Rfl: 3   Ubrogepant 100 MG TABS, TAKE 1 TABLET (100 MG) BY MOUTH AT ONSET OF HEADACHE, MAY REPEAT 1 TABLET IN 2 HOURS, NO MORE THAN 2 TABLETS IN 24 HOURS, Disp: 10 tablet, Rfl: 11   zolpidem (AMBIEN) 10 MG tablet, Take 1 tablet (10 mg total) by mouth at bedtime as needed for sleep., Disp: 30 tablet, Rfl: 5  Current Facility-Administered Medications:    cyanocobalamin ((VITAMIN B-12)) injection 1,000 mcg, 1,000 mcg, Intramuscular, Q30 days, , [name], FNP, 1,000 mcg at 05/26/19 1035  Social History   Tobacco Use  Smoking Status Never  Smokeless Tobacco Never    No Known Allergies Objective:  There were no vitals  filed for this visit. There is no height or weight on file to calculate BMI. Constitutional Well developed. Well nourished.  Vascular Foot warm and well perfused. Capillary refill normal to all digits.   Neurologic Normal speech. Oriented to person, place, and time. Epicritic sensation to light touch grossly present bilaterally.  Dermatologic  incision has completely very epithelialized. No  Taunt  fascia noted plantar fascia noted.  Pain at the calcaneal tuber  Orthopedic: No completely epithelialized. Tenderness to palpation noted about the surgical site.   Radiographs: None Assessment:   1. Plantar fasciitis of right foot   2. Plantar fasciitis of left foot       Plan:  Patient was evaluated and  treated and all questions answered. \ Bilateral plantar fasciitis status post right foot EPF 05/27/2020 -She had a good amount of improvement with PRP injection however she was moving and right away after the third PRP injection which may have negated the effects of the PRP.  I discussed with the patient that we can try 1 more PRP injection to see if there is any further benefit.  She states understand elect to proceed with 1 last PRP injection -I once again we discussed all of her treatment options including physical therapy as well as surgical options.  She has tried EPAT therapy which has not helped either.   No follow-ups on file.   Pes planovalgus -I explained to the patient the etiology of pes planovalgus of her extremity options were discussed.  I believe patient will benefit from custom-made orthotics to help control hindfoot motion and support the arch of the foot take the stress away from the plantar fascia.   -Patient has obtained orthotics and functioning well with them. No acute complaints  No follow-ups on file.

## 2021-07-29 ENCOUNTER — Ambulatory Visit: Payer: 59 | Admitting: Internal Medicine

## 2021-07-29 ENCOUNTER — Other Ambulatory Visit: Payer: Self-pay

## 2021-07-29 ENCOUNTER — Encounter: Payer: Self-pay | Admitting: Internal Medicine

## 2021-07-29 VITALS — BP 107/73 | HR 81 | Ht 61.0 in | Wt 130.0 lb

## 2021-07-29 DIAGNOSIS — M79641 Pain in right hand: Secondary | ICD-10-CM

## 2021-07-29 DIAGNOSIS — M79642 Pain in left hand: Secondary | ICD-10-CM | POA: Diagnosis not present

## 2021-07-29 DIAGNOSIS — M25551 Pain in right hip: Secondary | ICD-10-CM | POA: Diagnosis not present

## 2021-07-29 DIAGNOSIS — M720 Palmar fascial fibromatosis [Dupuytren]: Secondary | ICD-10-CM

## 2021-07-29 DIAGNOSIS — M25552 Pain in left hip: Secondary | ICD-10-CM | POA: Diagnosis not present

## 2021-07-29 NOTE — Progress Notes (Signed)
Office Visit Note  Patient: Erica Rodriguez             Date of Birth: Jun 27, 1986           MRN: [id number]             PCP: Bennie Pierini, FNP Referring: Bennie Pierini, * Visit Date: 07/29/2021   Subjective:  Pain of the Right Hand (Bilateral hand pain, right=left.  Feels like the pain is getting worse. Joints are hurting and feel stiff. She had to stop wearing engagement ring due to the pain and it feeling heavy.), Pain of the Left Hand, Pain of the Left Knee, Pain of the Right Hip, Pain of the Right Knee, and Pain of the Left Hip   History of Present Illness: Erica Rodriguez is a 35 y.o. female here for follow up for chronic pain affecting bilateral hands also pain in both hips. Her right hand has daily pain localized around the wrist at the ulnar side sometimes going up to the palm. Hip pain is on the lateral sides of both, usually feels it with change in positions not particularly worsened at night. She is getting ongoing treatment with PRP injection for her very resistant to treatment plantar fasciitis. This is worse in the right foot with palpable nodules and also pain into the posterior heel. She had carpal tunnel release surgery for the left hand which has healed up well. This improved that hand a lot although still has numbness in the fingertips sometimes.  Previous HPI 12/25/20 Erica Rodriguez is a 36 y.o. female here for evaluation of joint pains especially in her hands. She previously saw orthopedic surgery for bilateral carpal tunnel syndrome last year and has seen podiatry for plantar fasciitis symptoms. She sees neurology for chronic migraines. 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  This commonly feels like an aching type of pain in the joints of her knuckles also gets a burning and stinging type pain in her wrist.  Her carpal tunnel release  surgery seem partially helpful though she still has the stinging wrist pain symptoms.  She also had right cubital tunnel surgery for persistent numbness in the fingers of the right hand she feels overall these have partially improved her symptoms.  She notices some benefit with gabapentin and meloxicam and her hand pain.  She does not report any specific joint swelling or erythema though does comment she can only wear some rings on her fingers that previously fit. She does describe hand discoloration looking paler than other areas.   Review of Systems  Cardiovascular:  Negative for swelling in legs/feet.  Musculoskeletal:  Positive for joint pain, joint pain, joint swelling, morning stiffness and muscle tenderness. Negative for gait problem and muscle weakness.  Skin:  Negative for color change.  Neurological:  Positive for numbness. Negative for weakness.   PMFS History:  Patient Active Problem List   Diagnosis Date Noted   Bilateral hip pain 07/29/2021   Dupuytren's contracture of right hand 07/29/2021   Chronic migraine without aura without status migrainosus, not intractable 02/18/2021   Plantar fasciitis 12/25/2020   Ulnar nerve compression, right 12/25/2020   Bilateral carpal tunnel syndrome 02/15/2020   Bilateral hand pain 02/10/2020   Bilateral wrist pain 02/03/2020   Bilateral hand numbness 02/03/2020   Chronic migraine without aura, with intractable migraine, so  stated, with status migrainosus 12/21/2018   Insomnia 10/07/2012   GAD (generalized anxiety disorder) 10/07/2012    Past Medical History:  Diagnosis Date   Depression    Headache    migraines   Mild preeclampsia     Family History  Problem Relation Age of Onset   Thyroid disease Mother    Hyperlipidemia Mother    Arthritis/Rheumatoid Mother    Diabetes Mother    Hypertension Father    Kidney disease Father    Cirrhosis Father    Liver cancer Father    Diabetes Maternal Grandmother    Stroke Maternal  Grandmother    Breast cancer Maternal Grandmother    Stroke Maternal Grandfather    Healthy Son    Past Surgical History:  Procedure Laterality Date   CARPAL TUNNEL RELEASE Right 2021   carpel tunnel - left     CESAREAN SECTION     FOOT SURGERY Bilateral    PLANTAR FASCIA RELEASE  2021   ULNAR NERVE REPAIR     WISDOM TOOTH EXTRACTION     Social History   Social History Narrative   Lives at home with husband and son   Drinks 1 soda a day    Immunization History  Administered Date(s) Administered   Influenza-Unspecified 04/10/2015, 04/04/2016, 04/01/2018, 05/03/2019, 04/30/2020   PFIZER(Purple Top)SARS-COV-2 Vaccination 03/05/2020, 04/18/2020   Tdap 05/12/2007, 05/28/2021     Objective: Vital Signs: BP 107/73    Pulse 81    Ht 5\' 1"  (1.549 m)    Wt 130 lb (59 kg)    BMI 24.56 kg/m    Physical Exam Cardiovascular:     Rate and Rhythm: Normal rate and regular rhythm.  Pulmonary:     Effort: Pulmonary effort is normal.     Breath sounds: Normal breath sounds.  Skin:    General: Skin is warm and dry.     Findings: No rash.  Neurological:     General: No focal deficit present.     Mental Status: She is alert.     Gait: Gait normal.  Psychiatric:        Mood and Affect: Mood normal.     Musculoskeletal Exam:  Right wrist with tenderness more toward the hyperthenar eminence, there is Dupuytren's contracture involving the fourth and fifth digits this limits her extension relative to other finger joints but is able to reach a straight position, no overlying erythema swelling bruising or focal tenderness on exam, well-healed carpal tunnel release surgery scars Lateral hip pain is provoked with internal rotation and FADIR maneuver there is tenderness to direct palpation over head of the greater trochanter on both sides Palpable nodule on the plantar side at anterior border of calcaneus on the right foot, left foot with no palpable abnormality   Investigation: No additional  findings.  Imaging: No results found.  Recent Labs: Lab Results  Component Value Date   WBC 7.4 02/28/2021   HGB 13.2 02/28/2021   PLT 476 (H) 02/28/2021   NA 141 01/24/2021   K 4.2 01/24/2021   CL 107 (H) 01/24/2021   CO2 19 (L) 01/24/2021   GLUCOSE 110 (H) 01/24/2021   BUN 12 01/24/2021   CREATININE 0.76 01/24/2021   BILITOT 0.3 01/24/2021   ALKPHOS 96 01/24/2021   AST 10 01/24/2021   ALT 9 01/24/2021   PROT 7.6 01/24/2021   ALBUMIN 4.8 01/24/2021   CALCIUM 9.7 01/24/2021   GFRAA 132 07/13/2020    Speciality Comments: No specialty comments available.  Procedures:  No procedures performed Allergies: Patient has no known allergies.   Assessment / Plan:     Visit Diagnoses: Bilateral hand pain  I am not sure about the cause of the ongoing bilateral hand pain I do not appreciate any synovitis there is not much focal tenderness on my exam.  Definitely no clear inflammatory changes and no tenosynovitis on ultrasound.   Dupuytren's contracture of right hand  She does have Dupuytren's contracture on the right hand involving fourth and fifth fingers.  I do not know if this is contributing to the wrist pain that is directly proximal to it or if this is some residual effect from the previous ulnar nerve entrapment. Provided some information to review about this right now.  Bilateral hip pain  Discussed treatment options with use of muscle relaxant medication and recommended she increase this to taking once every night for now.  Provided printed range of motion exercises for what appears to be bilateral hip bursitis or at least hip lateral pain syndrome.  Discussed if symptoms are failing to improve can try additional treatment such as trochanteric bursitis injections or consider additional imaging.  Orders: No orders of the defined types were placed in this encounter.  No orders of the defined types were placed in this encounter.    Follow-Up Instructions: Return in about  6 weeks (around 09/09/2021), or if symptoms worsen or fail to improve, for Contractures, bursitis ROM exercises f/u 6wks.   Fuller Plan, MD  Note - This record has been created using [employer].  Chart creation errors have been sought, but may not always  have been located. Such creation errors do not reflect on  the standard of medical care.

## 2021-07-29 NOTE — Patient Instructions (Addendum)
I recommend trying to take the flexeril at night as well to help decrease hip tightness and irritation. You should perform the recommended exercises ideally at least 1-2 minutes each daily.  Hip Bursitis Rehab Ask your health care provider which exercises are safe for you. Do exercises exactly as told by your health care provider and adjust them as directed. It is normal to feel mild stretching, pulling, tightness, or discomfort as you do these exercises. Stop right away if you feel sudden pain or your pain gets worse. Do not begin these exercises until told by your health care provider. Stretching exercise This exercise warms up your muscles and joints and improves the movement and flexibility of your hip. This exercise also helps to relieve pain and stiffness. Iliotibial band stretch An iliotibial band is a strong band of muscle tissue that runs from the outer side of your hip to the outer side of your thigh and knee. Lie on your side with your left / right leg in the top position. Bend your left / right knee and grab your ankle. Stretch out your bottom arm to help you balance. Slowly bring your knee back so your thigh is behind your body. Slowly lower your knee toward the floor until you feel a gentle stretch on the outside of your left / right thigh. If you do not feel a stretch and your knee will not fall farther, place the heel of your other foot on top of your knee and pull your knee down toward the floor with your foot. Hold this position for __________ seconds. Slowly return to the starting position. Repeat __________ times. Complete this exercise __________ times a day. Strengthening exercises These exercises build strength and endurance in your hip and pelvis. Endurance is the ability to use your muscles for a long time, even after they get tired. Bridge This exercise strengthens the muscles that move your thigh backward (hip extensors). Lie on your back on a firm surface with your  knees bent and your feet flat on the floor. Tighten your buttocks muscles and lift your buttocks off the floor until your trunk is level with your thighs. Do not arch your back. You should feel the muscles working in your buttocks and the back of your thighs. If you do not feel these muscles, slide your feet 1-2 inches (2.5-5 cm) farther away from your buttocks. If this exercise is too easy, try doing it with your arms crossed over your chest. Hold this position for __________ seconds. Slowly lower your hips to the starting position. Let your muscles relax completely after each repetition. Repeat __________ times. Complete this exercise __________ times a day. Squats This exercise strengthens the muscles in front of your thigh and knee (quadriceps). Stand in front of a table, with your feet and knees pointing straight ahead. You may rest your hands on the table for balance but not for support. Slowly bend your knees and lower your hips like you are going to sit in a chair. Keep your weight over your heels, not over your toes. Keep your lower legs upright so they are parallel with the table legs. Do not let your hips go lower than your knees. Do not bend lower than told by your health care provider. If your hip pain increases, do not bend as low. Hold the squat position for __________ seconds. Slowly push with your legs to return to standing. Do not use your hands to pull yourself to standing. Repeat __________ times. Complete this  exercise __________ times a day. Single leg stand Without shoes, stand near a railing or in a doorway. You may hold on to the railing or door frame as needed for balance. Squeeze your left / right buttock muscles, then lift up your other foot. Do not let your left / right hip push out to the side. It is helpful to stand in front of a mirror for this exercise so you can watch your hip. Hold this position for __________ seconds. Repeat __________ times. Complete  this exercise __________ times a day. This information is not intended to replace advice given to you by your health care provider. Make sure you discuss any questions you have with your health care provider. Document Revised: 10/18/2018 Document Reviewed: 10/18/2018 Elsevier Patient Education  2022 [employer].

## 2021-07-30 ENCOUNTER — Other Ambulatory Visit (HOSPITAL_COMMUNITY): Payer: Self-pay

## 2021-08-01 ENCOUNTER — Other Ambulatory Visit (HOSPITAL_COMMUNITY): Payer: Self-pay

## 2021-08-07 ENCOUNTER — Other Ambulatory Visit: Payer: Self-pay

## 2021-08-07 ENCOUNTER — Telehealth: Payer: Self-pay | Admitting: Neurology

## 2021-08-07 ENCOUNTER — Ambulatory Visit (INDEPENDENT_AMBULATORY_CARE_PROVIDER_SITE_OTHER): Payer: Self-pay | Admitting: Podiatry

## 2021-08-07 DIAGNOSIS — M722 Plantar fascial fibromatosis: Secondary | ICD-10-CM

## 2021-08-07 NOTE — Progress Notes (Signed)
PRP injection was delivered in standard technique.   6.5 cc was injected to bilateral heel with 3.25 cc in each heel.  No acute complication noted.  Patient tolerated it well.

## 2021-08-07 NOTE — Telephone Encounter (Signed)
Completed PA on [employer] KEY: [id number] The request has been approved. The authorization is effective for a maximum of 12 fills from 08/07/2021 to 08/06/2022,

## 2021-08-19 ENCOUNTER — Other Ambulatory Visit (HOSPITAL_COMMUNITY): Payer: Self-pay

## 2021-08-20 ENCOUNTER — Telehealth: Payer: Self-pay | Admitting: Family Medicine

## 2021-08-20 NOTE — Telephone Encounter (Signed)
Faxed PA with OV notes to [facility].  ° °

## 2021-08-21 ENCOUNTER — Other Ambulatory Visit (HOSPITAL_COMMUNITY): Payer: Self-pay

## 2021-08-23 DIAGNOSIS — M79642 Pain in left hand: Secondary | ICD-10-CM | POA: Diagnosis not present

## 2021-08-23 DIAGNOSIS — M65311 Trigger thumb, right thumb: Secondary | ICD-10-CM | POA: Diagnosis not present

## 2021-08-23 DIAGNOSIS — M65312 Trigger thumb, left thumb: Secondary | ICD-10-CM | POA: Diagnosis not present

## 2021-08-23 DIAGNOSIS — M79641 Pain in right hand: Secondary | ICD-10-CM | POA: Diagnosis not present

## 2021-08-23 DIAGNOSIS — M720 Palmar fascial fibromatosis [Dupuytren]: Secondary | ICD-10-CM | POA: Diagnosis not present

## 2021-08-28 NOTE — Telephone Encounter (Signed)
Erica Rodriguez with [facility] called with the approval for patients Botox, PA # [phone]  (08/23/21-08/23/2022).

## 2021-08-29 ENCOUNTER — Ambulatory Visit: Payer: 59 | Admitting: Family Medicine

## 2021-08-29 DIAGNOSIS — G43709 Chronic migraine without aura, not intractable, without status migrainosus: Secondary | ICD-10-CM

## 2021-08-29 NOTE — Progress Notes (Signed)
08/29/2021 ALL: Erica Rodriguez returns for Botox. She continues to do well on propranolol, topiramate and Ubrelvy. She reports headaches were fairly well managed until the week prior to Botox was due. She has had to take more Toradol and Ubrelvy over the past week. She is uncertain of triggers but endorses more stress and potential relationship to weather changes. She will continue to monitor.   06/03/2021 ALL: Erica Rodriguez returns for Botox. She is doing well. She continues propranolol, topiramate and Ubrelvy. She doe snot usually have migraines until Botox wears off. She has been having more trouble with TMJ. Masseter injections have been very helpful but she is aware that we will only do Botox protocol today due to insurance requirements.   02/19/2021 ALL: She continues Botox, propranolol 80mg  daily, topiramate 100mg  BID and Ubrelvy. She rarely needs Toradol. She does use cyclobenzaprine for muscle tension leading to headaches. She feels that she is doing really well. May have 1-2 migraines per month, on average. She reports not taking propranolol while on vacation this summer and felt terribly. She feels that LS injection helps with right muscle tension. She has had masseter injections in the past that help with clenching.   11/19/2020 ALL: She feels Botox works very well for about 2.5 months then headaches return. We increased propranolol in 08/2020 to 80mg  ER daily. She reports this has helped with headaches. She continues topiramate 100mg  BID and Ubrelvy 100mg  PRN. She continues to have increased anxiety and feels stress contributes to headaches. Vibryyd worsened headaches. She was recently started on Wellbutrin. Toradol used sparingly for intractable headaches. She has had more tension in right shoulder/upper back.   08/09/2020 ALL: She is doing well, today. She does feel headaches have been a little more frequent over the past two weeks but feels that recent illness and being off schedule with Botox has played a  role. She continues to have about 10 migraines every month. She is considering increasing propranolol. Will wait to see what happens with this procedure. She continues topiramate and Nurtec/Ubrelvy.   04/23/2020 ALL: She is doing well. Botox is helping. She continues topiramate 100mg  BID and propranolol 60mg  daily. She alternates Nurtec and Ubrelvy. She usually uses Nurtec then will take Roselyn Meier if headache continues. She has about 10-12 migraines per month.  Stress is contributor.    Consent Form Botulism Toxin Injection For Chronic Migraine    Reviewed orally with patient, additionally signature is on file:  Botulism toxin has been approved by the Federal drug administration for treatment of chronic migraine. Botulism toxin does not cure chronic migraine and it may not be effective in some patients.  The administration of botulism toxin is accomplished by injecting a small amount of toxin into the muscles of the neck and head. Dosage must be titrated for each individual. Any benefits resulting from botulism toxin tend to wear off after 3 months with a repeat injection required if benefit is to be maintained. Injections are usually done every 3-4 months with maximum effect peak achieved by about 2 or 3 weeks. Botulism toxin is expensive and you should be sure of what costs you will incur resulting from the injection.  The side effects of botulism toxin use for chronic migraine may include:   -Transient, and usually mild, facial weakness with facial injections  -Transient, and usually mild, head or neck weakness with head/neck injections  -Reduction or loss of forehead facial animation due to forehead muscle weakness  -Eyelid drooping  -Dry eye  -Pain at  the site of injection or bruising at the site of injection  -Double vision  -Potential unknown long term risks   Contraindications: You should not have Botox if you are pregnant, nursing, allergic to albumin, have an infection, skin  condition, or muscle weakness at the site of the injection, or have myasthenia gravis, Lambert-Eaton syndrome, or ALS.  It is also possible that as with any injection, there may be an allergic reaction or no effect from the medication. Reduced effectiveness after repeated injections is sometimes seen and rarely infection at the injection site may occur. All care will be taken to prevent these side effects. If therapy is given over a long time, atrophy and wasting in the muscle injected may occur. Occasionally the patient's become refractory to treatment because they develop antibodies to the toxin. In this event, therapy needs to be modified.  I have read the above information and consent to the administration of botulism toxin.    BOTOX PROCEDURE NOTE FOR MIGRAINE HEADACHE  Contraindications and precautions discussed with patient(above). Aseptic procedure was observed and patient tolerated procedure. Procedure performed by Debbora Presto, FNP-C.   The condition has existed for more than 6 months, and pt does not have a diagnosis of ALS, Myasthenia Gravis or Lambert-Eaton Syndrome.  Risks and benefits of injections discussed and pt agrees to proceed with the procedure.  Written consent obtained  These injections are medically necessary. Pt  receives good benefits from these injections. These injections do not cause sedations or hallucinations which the oral therapies may cause.   Description of procedure:  The patient was placed in a sitting position. The standard protocol was used for Botox as follows, with 5 units of Botox injected at each site:  -Procerus muscle, midline injection  -Corrugator muscle, bilateral injection  -Frontalis muscle, bilateral injection, with 2 sites each side, medial injection was performed in the upper one third of the frontalis muscle, in the region vertical from the medial inferior edge of the superior orbital rim. The lateral injection was again in the upper one  third of the forehead vertically above the lateral limbus of the cornea, 1.5 cm lateral to the medial injection site.  -Temporalis muscle injection, 4 sites, bilaterally. The first injection was 3 cm above the tragus of the ear, second injection site was 1.5 cm to 3 cm up from the first injection site in line with the tragus of the ear. The third injection site was 1.5-3 cm forward between the first 2 injection sites. The fourth injection site was 1.5 cm posterior to the second injection site. 5th site laterally in the temporalis  muscleat the level of the outer canthus.  -Occipitalis muscle injection, 3 sites, bilaterally. The first injection was done one half way between the occipital protuberance and the tip of the mastoid process behind the ear. The second injection site was done lateral and superior to the first, 1 fingerbreadth from the first injection. The third injection site was 1 fingerbreadth superiorly and medially from the first injection site.  -Cervical paraspinal muscle injection, 2 sites, bilaterally. The first injection site was 1 cm from the midline of the cervical spine, 3 cm inferior to the lower border of the occipital protuberance. The second injection site was 1.5 cm superiorly and laterally to the first injection site.  -Trapezius muscle injection was performed at 3 sites, bilaterally. The first injection site was in the upper trapezius muscle halfway between the inflection point of the neck, and the acromion. The second injection  site was one half way between the acromion and the first injection site. The third injection was done between the first injection site and the inflection point of the neck.   Will return for repeat injection in 3 months.   A total of 200 units of Botox was prepared, 155 units of Botox was injected as documented above, 45 units of Botox was wasted. The patient tolerated the procedure well, there were no complications of the above procedure.

## 2021-08-29 NOTE — Progress Notes (Signed)
Botox- 200 units x 1 vial °Lot: [id number] °Expiration: 10/2023 °NDC: 0023-3921-02 ° °Bacteriostatic 0.9% Sodium Chloride- 4mL total °Lot: [id number] °Expiration: 02/05/2023 °NDC: 0409-1966-02 ° °Dx: G43.711 °B/B ° °

## 2021-09-05 ENCOUNTER — Other Ambulatory Visit (HOSPITAL_COMMUNITY): Payer: Self-pay

## 2021-09-05 ENCOUNTER — Encounter: Payer: Self-pay | Admitting: Family Medicine

## 2021-09-05 ENCOUNTER — Other Ambulatory Visit: Payer: Self-pay | Admitting: *Deleted

## 2021-09-05 DIAGNOSIS — G43709 Chronic migraine without aura, not intractable, without status migrainosus: Secondary | ICD-10-CM

## 2021-09-05 MED ORDER — UBRELVY 100 MG PO TABS
100.0000 mg | ORAL_TABLET | Freq: Every day | ORAL | 11 refills | Status: DC | PRN
  Filled 2021-09-05: qty 10, 30d supply, fill #0
  Filled 2021-10-08: qty 10, 30d supply, fill #1
  Filled 2021-11-13: qty 10, 30d supply, fill #2
  Filled 2021-12-17: qty 10, 30d supply, fill #3
  Filled 2022-01-24: qty 10, 30d supply, fill #4
  Filled 2022-02-24: qty 10, 30d supply, fill #5
  Filled 2022-03-24: qty 10, 30d supply, fill #6
  Filled 2022-05-06: qty 10, 30d supply, fill #7
  Filled 2022-06-02: qty 10, 30d supply, fill #8

## 2021-09-06 ENCOUNTER — Ambulatory Visit: Payer: 59 | Admitting: Internal Medicine

## 2021-09-10 ENCOUNTER — Encounter: Payer: Self-pay | Admitting: Family Medicine

## 2021-09-11 ENCOUNTER — Other Ambulatory Visit: Payer: Self-pay | Admitting: *Deleted

## 2021-09-11 ENCOUNTER — Other Ambulatory Visit (HOSPITAL_COMMUNITY): Payer: Self-pay

## 2021-09-11 DIAGNOSIS — G43711 Chronic migraine without aura, intractable, with status migrainosus: Secondary | ICD-10-CM

## 2021-09-11 MED ORDER — KETOROLAC TROMETHAMINE 60 MG/2ML IM SOLN
60.0000 mg | INTRAMUSCULAR | 0 refills | Status: DC
  Filled 2021-09-11: qty 10, 5d supply, fill #0

## 2021-09-11 NOTE — Progress Notes (Signed)
Office Visit Note  Patient: Erica Rodriguez             Date of Birth: 1986/01/17           MRN: [id number]             PCP: Bennie PieriniMartin, [name], FNP Referring: Bennie PieriniMartin, [name], * Visit Date: 09/12/2021   Subjective:  Follow-up (Right hip pain, bil hand pain and swelling)   History of Present Illness: Erica Rodriguez is a 36 y.o. female here for follow up for bilateral hand pain with dupuytren's contracture and also bilateral hip pain trying muscle relaxant medication and ROM exercises at last visit. She has not had a great benefit with any of these problems still about the same. Right sided hip and leg pain extending to the low back area, no weakness or numbness not much radiation past the thigh. Hands staying very cold and frequently painful.  Previous HPI 07/29/21 Erica Rodriguez is a 36 y.o. female here for follow up for chronic pain affecting bilateral hands also pain in both hips. Her right hand has daily pain localized around the wrist at the ulnar side sometimes going up to the palm. Hip pain is on the lateral sides of both, usually feels it with change in positions not particularly worsened at night. She is getting ongoing treatment with PRP injection for her very resistant to treatment plantar fasciitis. This is worse in the right foot with palpable nodules and also pain into the posterior heel. She had carpal tunnel release surgery for the left hand which has healed up well. This improved that hand a lot although still has numbness in the fingertips sometimes.   Previous HPI 12/25/20 Erica Rodriguez is a 36 y.o. female here for evaluation of joint pains especially in her hands. She previously saw orthopedic surgery for bilateral carpal tunnel syndrome last year and has seen podiatry for plantar fasciitis symptoms. She sees neurology for chronic migraines. 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  This commonly feels like an aching type of pain in the joints of her knuckles also gets a burning and stinging type pain in her wrist.  Her carpal tunnel release surgery seem partially helpful though she still has the stinging wrist pain symptoms.  She also had right cubital tunnel surgery for persistent numbness in the fingers of the right hand she feels overall these have partially improved her symptoms.  She notices some benefit with gabapentin and meloxicam and her hand pain.  She does not report any specific joint swelling or erythema though does comment she can only wear some rings on her fingers that previously fit. She does describe hand discoloration looking paler than other areas.   Review of Systems  Constitutional:  Positive for fatigue.  HENT:  Positive for mouth dryness.   Eyes:  Negative for dryness.  Respiratory:  Negative for shortness of breath.   Cardiovascular:  Negative for swelling in legs/feet.  Gastrointestinal:  Negative for constipation.  Endocrine: Positive for cold intolerance and excessive thirst.  Genitourinary:  Negative for difficulty urinating.  Musculoskeletal:  Positive for joint pain, joint pain, joint swelling, muscle weakness and morning stiffness.  Skin:  Negative for rash.  Allergic/Immunologic: Negative for susceptible to infections.  Neurological:  Positive for numbness.  Hematological:  Negative for bruising/bleeding tendency.  Psychiatric/Behavioral:  Positive  for sleep disturbance.    PMFS History:  Patient Active Problem List   Diagnosis Date Noted   Bilateral hip pain 07/29/2021   Dupuytren's contracture of right hand 07/29/2021   Chronic migraine without aura without status migrainosus, not intractable 02/18/2021   Plantar fasciitis 12/25/2020   Ulnar nerve compression, right 12/25/2020   Bilateral carpal tunnel syndrome 02/15/2020   Bilateral hand pain 02/10/2020   Bilateral wrist pain 02/03/2020    Bilateral hand numbness 02/03/2020   Chronic migraine without aura, with intractable migraine, so stated, with status migrainosus 12/21/2018   Insomnia 10/07/2012   GAD (generalized anxiety disorder) 10/07/2012    Past Medical History:  Diagnosis Date   Depression    Headache    migraines   Mild preeclampsia     Family History  Problem Relation Age of Onset   Thyroid disease Mother    Hyperlipidemia Mother    Arthritis/Rheumatoid Mother    Diabetes Mother    Hypertension Father    Kidney disease Father    Cirrhosis Father    Liver cancer Father    Diabetes Maternal Grandmother    Stroke Maternal Grandmother    Breast cancer Maternal Grandmother    Stroke Maternal Grandfather    Healthy Son    Past Surgical History:  Procedure Laterality Date   CARPAL TUNNEL RELEASE Right 2021   carpel tunnel - left     CESAREAN SECTION     FOOT SURGERY Bilateral    PLANTAR FASCIA RELEASE  2021   ULNAR NERVE REPAIR     WISDOM TOOTH EXTRACTION     Social History   Social History Narrative   Lives at home with husband and son   Drinks 1 soda a day    Immunization History  Administered Date(s) Administered   Influenza-Unspecified 04/10/2015, 04/04/2016, 04/01/2018, 05/03/2019, 04/30/2020   PFIZER(Purple Top)SARS-COV-2 Vaccination 03/05/2020, 04/18/2020   Tdap 05/12/2007, 05/28/2021     Objective: Vital Signs: BP 108/74 (BP Location: Left Arm, Patient Position: Sitting, Cuff Size: Normal)    Pulse 89    Resp 14    Ht 5\' 1"  (1.549 m)    Wt 127 lb (57.6 kg)    BMI 24.00 kg/m    Physical Exam Cardiovascular:     Rate and Rhythm: Normal rate and regular rhythm.  Pulmonary:     Effort: Pulmonary effort is normal.     Breath sounds: Normal breath sounds.  Musculoskeletal:     Right lower leg: No edema.     Left lower leg: No edema.  Skin:    General: Skin is warm and dry.     Findings: No rash.  Neurological:     Mental Status: She is alert.     Musculoskeletal Exam:   Shoulders full ROM no tenderness or swelling Elbows full ROM no tenderness or swelling Wrists full ROM no tenderness or swelling Fingers full ROM no tenderness or swelling Right sided low back palpable muscle knot with tenderness above SI joint or along iliac crest No focal tenderness over greater trochanteric head Knees full ROM no tenderness or swelling Ankles full ROM no tenderness or swelling   Investigation: No additional findings.  Imaging: No results found.  Recent Labs: Lab Results  Component Value Date   WBC 7.4 02/28/2021   HGB 13.2 02/28/2021   PLT 476 (H) 02/28/2021   NA 141 01/24/2021   K 4.2 01/24/2021   CL 107 (H) 01/24/2021   CO2 19 (L) 01/24/2021   GLUCOSE  110 (H) 01/24/2021   BUN 12 01/24/2021   CREATININE 0.76 01/24/2021   BILITOT 0.3 01/24/2021   ALKPHOS 96 01/24/2021   AST 10 01/24/2021   ALT 9 01/24/2021   PROT 7.6 01/24/2021   ALBUMIN 4.8 01/24/2021   CALCIUM 9.7 01/24/2021   GFRAA 132 07/13/2020    Speciality Comments: No specialty comments available.  Procedures:  No procedures performed Allergies: Patient has no known allergies.   Assessment / Plan:     Visit Diagnoses: Bilateral hand pain Bilateral hand numbness  No inflammatory changes seen, no raynaud's, no median nerve enlargement or deficits. There is also not much osteoarthritis change. Palmar fascia changes not causing pain. She continue trying to keep hands warm or as needed NSAIDs no additional treatment or follow up recommended.  Bilateral hip pain  Right side mostly today also with pain going to the low back. Seems more like myofascial pain or possibly piriformis syndrome with palpable tender muscle knots and location of pain. Does not appear suggestive for spine or hip joint problem as the cause. She does not know if she would have time to attend physical therapy. Provided ROM exercises to work on independently.  Orders: No orders of the defined types were placed in this  encounter.  No orders of the defined types were placed in this encounter.    Follow-Up Instructions: No follow-ups on file.   Fuller Plan, MD  Note - This record has been created using [employer].  Chart creation errors have been sought, but may not always  have been located. Such creation errors do not reflect on  the standard of medical care.

## 2021-09-12 ENCOUNTER — Ambulatory Visit: Payer: 59 | Admitting: Internal Medicine

## 2021-09-12 ENCOUNTER — Other Ambulatory Visit (HOSPITAL_COMMUNITY): Payer: Self-pay

## 2021-09-12 ENCOUNTER — Encounter: Payer: Self-pay | Admitting: Internal Medicine

## 2021-09-12 ENCOUNTER — Other Ambulatory Visit: Payer: Self-pay

## 2021-09-12 VITALS — BP 108/74 | HR 89 | Resp 14 | Ht 61.0 in | Wt 127.0 lb

## 2021-09-12 DIAGNOSIS — M25551 Pain in right hip: Secondary | ICD-10-CM

## 2021-09-12 DIAGNOSIS — R200 Anesthesia of skin: Secondary | ICD-10-CM | POA: Diagnosis not present

## 2021-09-12 DIAGNOSIS — M25552 Pain in left hip: Secondary | ICD-10-CM | POA: Diagnosis not present

## 2021-09-12 DIAGNOSIS — M79641 Pain in right hand: Secondary | ICD-10-CM

## 2021-09-12 DIAGNOSIS — M79642 Pain in left hand: Secondary | ICD-10-CM

## 2021-09-13 ENCOUNTER — Other Ambulatory Visit: Payer: 59

## 2021-09-17 ENCOUNTER — Encounter: Payer: Self-pay | Admitting: Family Medicine

## 2021-09-17 ENCOUNTER — Other Ambulatory Visit (HOSPITAL_COMMUNITY): Payer: Self-pay

## 2021-10-04 ENCOUNTER — Other Ambulatory Visit (HOSPITAL_COMMUNITY): Payer: Self-pay

## 2021-10-04 MED ORDER — GABAPENTIN 300 MG PO CAPS
300.0000 mg | ORAL_CAPSULE | Freq: Three times a day (TID) | ORAL | 0 refills | Status: DC
  Filled 2021-10-04: qty 90, 30d supply, fill #0

## 2021-10-08 ENCOUNTER — Encounter: Payer: Self-pay | Admitting: Family Medicine

## 2021-10-09 ENCOUNTER — Other Ambulatory Visit: Payer: Self-pay | Admitting: *Deleted

## 2021-10-09 ENCOUNTER — Other Ambulatory Visit (HOSPITAL_COMMUNITY): Payer: Self-pay

## 2021-10-09 DIAGNOSIS — G43711 Chronic migraine without aura, intractable, with status migrainosus: Secondary | ICD-10-CM

## 2021-10-09 MED ORDER — METHYLPREDNISOLONE 4 MG PO TBPK
ORAL_TABLET | ORAL | 0 refills | Status: DC
  Filled 2021-10-09: qty 21, 6d supply, fill #0

## 2021-10-10 ENCOUNTER — Encounter: Payer: Self-pay | Admitting: Family Medicine

## 2021-10-10 ENCOUNTER — Other Ambulatory Visit (HOSPITAL_COMMUNITY): Payer: Self-pay

## 2021-10-10 ENCOUNTER — Ambulatory Visit (INDEPENDENT_AMBULATORY_CARE_PROVIDER_SITE_OTHER): Payer: 59

## 2021-10-10 ENCOUNTER — Ambulatory Visit: Payer: 59 | Admitting: Family Medicine

## 2021-10-10 VITALS — BP 108/73 | HR 84 | Temp 97.7°F | Ht 61.0 in | Wt 125.8 lb

## 2021-10-10 DIAGNOSIS — M65312 Trigger thumb, left thumb: Secondary | ICD-10-CM | POA: Diagnosis not present

## 2021-10-10 DIAGNOSIS — K5900 Constipation, unspecified: Secondary | ICD-10-CM | POA: Diagnosis not present

## 2021-10-10 DIAGNOSIS — W19XXXA Unspecified fall, initial encounter: Secondary | ICD-10-CM | POA: Diagnosis not present

## 2021-10-10 DIAGNOSIS — M545 Low back pain, unspecified: Secondary | ICD-10-CM

## 2021-10-10 DIAGNOSIS — G5603 Carpal tunnel syndrome, bilateral upper limbs: Secondary | ICD-10-CM | POA: Diagnosis not present

## 2021-10-10 DIAGNOSIS — S300XXA Contusion of lower back and pelvis, initial encounter: Secondary | ICD-10-CM

## 2021-10-10 MED ORDER — NAPROXEN 500 MG PO TABS
500.0000 mg | ORAL_TABLET | Freq: Two times a day (BID) | ORAL | 0 refills | Status: AC
Start: 2021-10-10 — End: 2021-10-24

## 2021-10-10 NOTE — Progress Notes (Signed)
?  ? ?Subjective:  ?Patient ID: Erica Rodriguez, female    DOB: 1986-02-24, 36 y.o.   MRN: [id number] ? ?Patient Care Team: ?Chevis Pretty, FNP as PCP - General (Nurse Practitioner) ?[facility], [place] [facility])  ? ?Chief Complaint:  Fall (10/08/21) and Back Pain ? ? ?HPI: ?Erica Rodriguez is a 36 y.o. female presenting on 10/10/2021 for Fall (10/08/21) and Back Pain ? ? ?Pt states she slipped and fell down the stairs 2 days ago. She reports ongoing lower back pain since fall. She has tried motrin with slight relief of symptoms. She denies loss of bowel or bladder. No numbness, tingling, weakness, or difficulty walking.  ? ?Fall ?The accident occurred 2 days ago. The pain is present in the back. The symptoms are aggravated by ambulation, pressure on injury and movement. Pertinent negatives include no abdominal pain, bowel incontinence, fever, headaches, hearing loss, hematuria, loss of consciousness, nausea, numbness, tingling, visual change or vomiting. She has tried NSAID for the symptoms. The treatment provided mild relief.  ?Back Pain ?This is a new problem. The current episode started in the past 7 days. The problem has been waxing and waning since onset. The pain is present in the lumbar spine. The quality of the pain is described as aching and shooting. The pain does not radiate. The symptoms are aggravated by bending, position, twisting and stress. Pertinent negatives include no abdominal pain, bladder incontinence, bowel incontinence, chest pain, dysuria, fever, headaches, leg pain, numbness, paresis, paresthesias, pelvic pain, perianal numbness, tingling, weakness or weight loss. She has tried NSAIDs for the symptoms. The treatment provided mild relief.  ? ? ?Relevant past medical, surgical, family, and social history reviewed and updated as indicated.  ?Allergies and medications reviewed and updated. Data reviewed: Chart in Epic. ? ? ?Past Medical History:  ?Diagnosis Date  ? Depression    ? Headache   ? migraines  ? Mild preeclampsia   ? ? ?Past Surgical History:  ?Procedure Laterality Date  ? CARPAL TUNNEL RELEASE Right 2021  ? carpel tunnel - left    ? CESAREAN SECTION    ? FOOT SURGERY Bilateral   ? PLANTAR FASCIA RELEASE  2021  ? ULNAR NERVE REPAIR    ? WISDOM TOOTH EXTRACTION    ? ? ?Social History  ? ?Socioeconomic History  ? Marital status: Married  ?  Spouse name: Legrand Como  ? Number of children: 1  ? Years of education: 12  ? Highest education level: Not on file  ?Occupational History  ?  Comment: clincal registration  ?Tobacco Use  ? Smoking status: Never  ? Smokeless tobacco: Never  ?Vaping Use  ? Vaping Use: Never used  ?Substance and Sexual Activity  ? Alcohol use: No  ? Drug use: No  ? Sexual activity: Yes  ?Other Topics Concern  ? Not on file  ?Social History Narrative  ? Lives at home with husband and son  ? Drinks 1 soda a day   ? ?Social Determinants of Health  ? ?Financial Resource Strain: Not on file  ?Food Insecurity: Not on file  ?Transportation Needs: Not on file  ?Physical Activity: Not on file  ?Stress: Not on file  ?Social Connections: Not on file  ?Intimate Partner Violence: Not on file  ? ? ?Outpatient Encounter Medications as of 10/10/2021  ?Medication Sig  ? buPROPion (WELLBUTRIN XL) 300 MG 24 hr tablet Take 1 tablet (300 mg total) by mouth daily.  ? cyanocobalamin (,VITAMIN B-12,) 1000 MCG/ML injection  Inject 1 mL (1,000 mcg total) into the muscle every 30 days  ? cyclobenzaprine (FLEXERIL) 5 MG tablet Take 1 tablet (5 mg total) by mouth 3 (three) times daily as needed for muscle spasms  ? diazepam (VALIUM) 5 MG tablet Take 1 tablet (5 mg total) by mouth every 12 (twelve) hours as needed for anxiety.  ? gabapentin (NEURONTIN) 300 MG capsule Take 1 capsule (300 mg total) by mouth 3 (three) times daily.  ? gabapentin (NEURONTIN) 300 MG capsule Take 1 capsule (300 mg total) by mouth 3 (three) times daily.  ? MIRENA, 52 MG, 20 MCG/24HR IUD   ? naproxen (NAPROSYN) 500 MG tablet  Take 1 tablet (500 mg total) by mouth 2 (two) times daily with a meal for 14 days.  ? OnabotulinumtoxinA (BOTOX IM) Inject into the muscle. 155units IM to head and neck every 3 months for migraines.  ? ondansetron (ZOFRAN-ODT) 4 MG disintegrating tablet Take 1-2 tablets (4-8 mg total) by mouth every 8 (eight) hours as needed for nausea or vomiting.  ? propranolol ER (INDERAL LA) 80 MG 24 hr capsule Take 1 capsule (80 mg total) by mouth daily.  ? Rimegepant Sulfate (NURTEC) 75 MG TBDP Take 1 tablet (75 mg) by mouth once daily as needed for up to 1 dose. Try to take as soon as possible for migraine.  ? topiramate (TOPAMAX) 100 MG tablet Take 1 tablet (100 mg total) by mouth 2 (two) times daily.  ? Ubrogepant (UBRELVY) 100 MG TABS Take 100 mg by mouth daily as needed. Take one tablet at onset of headache, may repeat 1 tablet in 2 hours, no more than 2 tablets in 24 hours  ? zolpidem (AMBIEN) 10 MG tablet Take 1 tablet (10 mg total) by mouth at bedtime as needed for sleep.  ? [DISCONTINUED] ketorolac (TORADOL) 60 MG/2ML SOLN injection Inject 2 mLs (60 mg total) into the muscle, at onset of migraine. may repeat in 6 hours. max 2 times daily and 4 days a month  ? [DISCONTINUED] methylPREDNISolone (MEDROL DOSEPAK) 4 MG TBPK tablet Taper as directed by packaging for migraine  ? ?Facility-Administered Encounter Medications as of 10/10/2021  ?Medication  ? cyanocobalamin ((VITAMIN B-12)) injection 1,000 mcg  ? ? ?No Known Allergies ? ?Review of Systems  ?Constitutional:  Negative for activity change, appetite change, chills, diaphoresis, fatigue, fever, unexpected weight change and weight loss.  ?HENT: Negative.    ?Eyes: Negative.   ?Respiratory:  Negative for cough, chest tightness and shortness of breath.   ?Cardiovascular:  Negative for chest pain, palpitations and leg swelling.  ?Gastrointestinal:  Negative for abdominal pain, blood in stool, bowel incontinence, constipation, diarrhea, nausea and vomiting.  ?Endocrine:  Negative.   ?Genitourinary:  Negative for bladder incontinence, decreased urine volume, difficulty urinating, dysuria, flank pain, frequency, hematuria, pelvic pain and urgency.  ?Musculoskeletal:  Positive for arthralgias, back pain and myalgias. Negative for gait problem, joint swelling, neck pain and neck stiffness.  ?Skin: Negative.   ?Allergic/Immunologic: Negative.   ?Neurological:  Negative for dizziness, tingling, loss of consciousness, weakness, numbness, headaches and paresthesias.  ?Hematological: Negative.   ?Psychiatric/Behavioral:  Negative for confusion, hallucinations, sleep disturbance and suicidal ideas.   ?All other systems reviewed and are negative. ? ?   ? ?Objective:  ?BP 108/73   Pulse 84   Temp 97.7 ?F (36.5 ?C)   Ht 5\' 1"  (1.549 m)   Wt 125 lb 12.8 oz (57.1 kg)   SpO2 100%   BMI 23.77 kg/m?   ? ?  Wt Readings from Last 3 Encounters:  ?10/10/21 125 lb 12.8 oz (57.1 kg)  ?09/12/21 127 lb (57.6 kg)  ?07/29/21 130 lb (59 kg)  ? ? ?Physical Exam ?Vitals and nursing note reviewed.  ?Constitutional:   ?   General: She is not in acute distress. ?   Appearance: Normal appearance. She is well-developed, well-groomed and normal weight. She is not ill-appearing, toxic-appearing or diaphoretic.  ?HENT:  ?   Head: Normocephalic and atraumatic.  ?   Jaw: There is normal jaw occlusion.  ?   Right Ear: Hearing normal.  ?   Left Ear: Hearing normal.  ?   Nose: Nose normal.  ?   Mouth/Throat:  ?   Lips: Pink.  ?   Mouth: Mucous membranes are moist.  ?   Pharynx: Oropharynx is clear. Uvula midline.  ?Eyes:  ?   General: Lids are normal.  ?   Extraocular Movements: Extraocular movements intact.  ?   Conjunctiva/sclera: Conjunctivae normal.  ?   Pupils: Pupils are equal, round, and reactive to light.  ?Neck:  ?   Thyroid: No thyroid mass, thyromegaly or thyroid tenderness.  ?   Vascular: No carotid bruit or JVD.  ?   Trachea: Trachea and phonation normal.  ?Cardiovascular:  ?   Rate and Rhythm: Normal rate  and regular rhythm.  ?   Chest Wall: PMI is not displaced.  ?   Pulses: Normal pulses.  ?   Heart sounds: Normal heart sounds. No murmur heard. ?  No friction rub. No gallop.  ?Pulmonary:  ?   Effort: Pulmonary

## 2021-10-14 ENCOUNTER — Other Ambulatory Visit (HOSPITAL_COMMUNITY): Payer: Self-pay

## 2021-10-16 ENCOUNTER — Other Ambulatory Visit (HOSPITAL_COMMUNITY): Payer: Self-pay

## 2021-10-18 DIAGNOSIS — M65321 Trigger finger, right index finger: Secondary | ICD-10-CM | POA: Diagnosis not present

## 2021-10-18 DIAGNOSIS — M720 Palmar fascial fibromatosis [Dupuytren]: Secondary | ICD-10-CM | POA: Diagnosis not present

## 2021-10-25 DIAGNOSIS — G5603 Carpal tunnel syndrome, bilateral upper limbs: Secondary | ICD-10-CM | POA: Diagnosis not present

## 2021-10-30 ENCOUNTER — Telehealth: Payer: 59 | Admitting: Physician Assistant

## 2021-10-30 DIAGNOSIS — B3731 Acute candidiasis of vulva and vagina: Secondary | ICD-10-CM | POA: Diagnosis not present

## 2021-10-30 MED ORDER — FLUCONAZOLE 150 MG PO TABS
150.0000 mg | ORAL_TABLET | ORAL | 0 refills | Status: DC | PRN
Start: 2021-10-30 — End: 2021-11-14

## 2021-10-30 NOTE — Progress Notes (Signed)

## 2021-11-01 DIAGNOSIS — G5603 Carpal tunnel syndrome, bilateral upper limbs: Secondary | ICD-10-CM | POA: Diagnosis not present

## 2021-11-05 ENCOUNTER — Other Ambulatory Visit (HOSPITAL_COMMUNITY): Payer: Self-pay

## 2021-11-05 ENCOUNTER — Encounter: Payer: Self-pay | Admitting: Family Medicine

## 2021-11-05 ENCOUNTER — Other Ambulatory Visit: Payer: Self-pay | Admitting: Neurology

## 2021-11-05 DIAGNOSIS — G43711 Chronic migraine without aura, intractable, with status migrainosus: Secondary | ICD-10-CM

## 2021-11-05 MED ORDER — KETOROLAC TROMETHAMINE 60 MG/2ML IM SOLN
60.0000 mg | INTRAMUSCULAR | 0 refills | Status: DC
  Filled 2021-11-05: qty 10, 5d supply, fill #0

## 2021-11-06 NOTE — Progress Notes (Signed)
Toradol refill sent yesterday  ?

## 2021-11-13 ENCOUNTER — Other Ambulatory Visit (HOSPITAL_COMMUNITY): Payer: Self-pay

## 2021-11-13 NOTE — Progress Notes (Signed)
? ? ?Chief Complaint  ?Patient presents with  ? Follow-up  ?  Rm 1, alone. Here for migraine f/u. Having more HA then normal. Having 2-3 a week.   ? ? ?HISTORY OF PRESENT ILLNESS: ? ?11/14/21 ALL:  ?Erica Rodriguez is a 36 y.o. female here today for follow up for migraines. She has continued propranolol ER 80mg  daily, topiramate 100mg  BID and gabapentin 300mg  TID (started by ortho for neuropathy) all prescribed by PCP and Botox every 12 weeks with [facility]. She alternates Ubrelvy, Zofran and Toradol as needed for abortive therapy. Nurtec was not beneficial. She reports that headaches have seemed to be worse over the past 6-8 weeks. She is having 8-12 headache days a month. She reports right sided retro orbital pain that radiates to the back of the head and neck. She has light and sound sensitivity. She feels 6-7 per month are severe requiring multiple doses of abortive meds. She has had to call out of work twice over the past month. She takes Tylenol 2-3 times a week. Bernita RaisinUbrelvy usually works best.  ? ?She reports she is sleeping well. She is taking Ambien 10mg  QHS. She does feel Ambien can sometimes trigger headaches. Mood is good on bupropion XL 300mg  daily. She does endorse more stress recently. Her mother moved in with her in January 2023 following She has been trying to monitor diet. Boiled eggs and cheese seem to trigger headaches. She is drinking about 60 ounces of water daily. She does not exercise.  ? ?Meds tried and failed: propranolol, amitriptyline, topiramate, gabapentin, sertraline, bupropion, Emgality, Aimovig, Ajovy, Imitrex, Amerge, Frova, Toradol, Flexeril, Nurtec, Ubrelvy, ? ?HISTORY (copied from Erica Rodriguez's previous note) ? ?HPI:  Erica Rodriguez is a 36 y.o. female here as requested by Erica Rodriguez, Erica, * for migraines. She is a new patient to me and was seeing Erica Rodriguez. She has a PMHx of chronic migraines, depression. She had headaches daily, for 6 months, worsening since memorial day, Since  January and February having 15 days of headaches and 10 migraine days a month, right eye, unilateral, pounding, pulsating, throbbing, photo/phonophobia, nausea, lots of stress at work and home, making it worse. Since memorial day she has daily migraines. No medication overuse. No aura. She has tried and failed multiple medications. Stopping Ajovy. No other focal neurologic deficits, associated symptoms, inciting events or modifiable factors. ?  ?Meds tried and failed: amitriptyline, imitrex, toradol, flexeril, topiramate, gabapentin, sertraline, emgality, aimovig, ajovy ?  ?Reviewed notes, labs and imaging from outside physicians, which showed: ?  ?MRI brain 12/2014 showed No acute intracranial abnormalities including mass lesion or mass effect, hydrocephalus, extra-axial fluid collection, midline shift, hemorrhage, or acute infarction, large ischemic events (personally reviewed images) ?  ? ?REVIEW OF SYSTEMS: Out of a complete 14 system review of symptoms, headaches, insomnia, anxiety, the patient complains only of the following symptoms, and all other reviewed systems are negative. ? ? ?ALLERGIES: ?No Known Allergies ? ? ?HOME MEDICATIONS: ?Outpatient Medications Prior to Visit  ?Medication Sig Dispense Refill  ? buPROPion (WELLBUTRIN XL) 300 MG 24 hr tablet Take 1 tablet (300 mg total) by mouth daily. 90 tablet 1  ? cyanocobalamin (,VITAMIN B-12,) 1000 MCG/ML injection Inject 1 mL (1,000 mcg total) into the muscle every 30 days 3 mL 1  ? cyclobenzaprine (FLEXERIL) 5 MG tablet Take 1 tablet (5 mg total) by mouth 3 (three) times daily as needed for muscle spasms 60 tablet 11  ? diazepam (VALIUM) 5 MG tablet Take 1  tablet (5 mg total) by mouth every 12 (twelve) hours as needed for anxiety. 60 tablet 1  ? gabapentin (NEURONTIN) 300 MG capsule Take 1 capsule (300 mg total) by mouth 3 (three) times daily. 90 capsule 0  ? ketorolac (TORADOL) 60 MG/2ML SOLN injection Inject 2 mLs (60 mg total) into the muscle, at onset  of migraine. may repeat in 6 hours. max 2 times daily and 4 days a month 10 mL 0  ? MIRENA, 52 MG, 20 MCG/24HR IUD     ? OnabotulinumtoxinA (BOTOX IM) Inject into the muscle. 155units IM to head and neck every 3 months for migraines.    ? ondansetron (ZOFRAN-ODT) 4 MG disintegrating tablet Take 1-2 tablets (4-8 mg total) by mouth every 8 (eight) hours as needed for nausea or vomiting. 30 tablet 11  ? propranolol ER (INDERAL LA) 80 MG 24 hr capsule Take 1 capsule (80 mg total) by mouth daily. 90 capsule 3  ? Rimegepant Sulfate (NURTEC) 75 MG TBDP Take 1 tablet (75 mg) by mouth once daily as needed for up to 1 dose. Try to take as soon as possible for migraine. 8 tablet 11  ? topiramate (TOPAMAX) 100 MG tablet Take 1 tablet (100 mg total) by mouth 2 (two) times daily. 180 tablet 3  ? Ubrogepant (UBRELVY) 100 MG TABS Take 100 mg by mouth daily as needed. Take one tablet at onset of headache, may repeat 1 tablet in 2 hours, no more than 2 tablets in 24 hours 10 tablet 11  ? zolpidem (AMBIEN) 10 MG tablet Take 1 tablet (10 mg total) by mouth at bedtime as needed for sleep. 30 tablet 5  ? fluconazole (DIFLUCAN) 150 MG tablet Take 1 tablet (150 mg total) by mouth every 3 (three) days as needed. 2 tablet 0  ? gabapentin (NEURONTIN) 300 MG capsule Take 1 capsule (300 mg total) by mouth 3 (three) times daily. 90 capsule 5  ? ?Facility-Administered Medications Prior to Visit  ?Medication Dose Route Frequency Provider Last Rate Last Admin  ? cyanocobalamin ((VITAMIN B-12)) injection 1,000 mcg  1,000 mcg Intramuscular Q30 days Bennie Pierini, FNP   1,000 mcg at 05/26/19 1035  ? ? ? ?PAST MEDICAL HISTORY: ?Past Medical History:  ?Diagnosis Date  ? Depression   ? Headache   ? migraines  ? Mild preeclampsia   ? ? ? ?PAST SURGICAL HISTORY: ?Past Surgical History:  ?Procedure Laterality Date  ? CARPAL TUNNEL RELEASE Right 2021  ? carpel tunnel - left    ? CESAREAN SECTION    ? FOOT SURGERY Bilateral   ? PLANTAR FASCIA RELEASE   2021  ? ULNAR NERVE REPAIR    ? WISDOM TOOTH EXTRACTION    ? ? ? ?FAMILY HISTORY: ?Family History  ?Problem Relation Age of Onset  ? Thyroid disease Mother   ? Hyperlipidemia Mother   ? Arthritis/Rheumatoid Mother   ? Diabetes Mother   ? Hypertension Father   ? Kidney disease Father   ? Cirrhosis Father   ? Liver cancer Father   ? Diabetes Maternal Grandmother   ? Stroke Maternal Grandmother   ? Breast cancer Maternal Grandmother   ? Stroke Maternal Grandfather   ? Healthy Son   ? ? ? ?SOCIAL HISTORY: ?Social History  ? ?Socioeconomic History  ? Marital status: Married  ?  Spouse name: Casimiro Needle  ? Number of children: 1  ? Years of education: 64  ? Highest education level: Not on file  ?Occupational History  ?  Comment: clincal registration  ?Tobacco Use  ? Smoking status: Never  ? Smokeless tobacco: Never  ?Vaping Use  ? Vaping Use: Never used  ?Substance and Sexual Activity  ? Alcohol use: No  ? Drug use: No  ? Sexual activity: Yes  ?Other Topics Concern  ? Not on file  ?Social History Narrative  ? Lives at home with husband and son  ? Drinks 1 soda a day   ? ?Social Determinants of Health  ? ?Financial Resource Strain: Not on file  ?Food Insecurity: Not on file  ?Transportation Needs: Not on file  ?Physical Activity: Not on file  ?Stress: Not on file  ?Social Connections: Not on file  ?Intimate Partner Violence: Not on file  ? ? ? ?PHYSICAL EXAM ? ?Vitals:  ? 11/14/21 0808  ?BP: 100/70  ?Pulse: 72  ?Weight: 127 lb (57.6 kg)  ?Height: 5\' 1"  (1.549 m)  ? ?Body mass index is 24 kg/m?. ? ?Generalized: Well developed, in no acute distress ? ?Cardiology: normal rate and rhythm, no murmur auscultated  ?Respiratory: clear to auscultation bilaterally   ? ?Neurological examination  ?Mentation: Alert oriented to time, place, history taking. Follows all commands speech and language fluent ?Cranial nerve II-XII: Pupils were equal round reactive to light. Extraocular movements were full, visual field were full on confrontational  test. Facial sensation and strength were normal. Head turning and shoulder shrug  were normal and symmetric. ?Motor: The motor testing reveals 5 over 5 strength of all 4 extremities. Good symmetric motor t

## 2021-11-13 NOTE — Patient Instructions (Addendum)
Below is our plan: ? ?We will add Amovig injections every 30 days. Continue propranolol, topiramate, and gabapentin per PCP. Continue Ubrelvy, Zofran, Toradol, Flexeril and Tylenol as needed but please limit use of any abortive med to 1-2 times a week.  ? ?I advise against pregnancy while on Amovig and topiramate.  ? ?Please make sure you are staying well hydrated. I recommend 50-60 ounces daily. Well balanced diet and regular exercise encouraged. Consistent sleep schedule with 6-8 hours recommended.  ? ?Please continue follow up with care team as directed.  ? ?Follow up with me in 1 year  ? ?You may receive a survey regarding today's visit. I encourage you to leave honest feed back as I do use this information to improve patient care. Thank you for seeing me today!  ? ? ?

## 2021-11-14 ENCOUNTER — Ambulatory Visit: Payer: 59 | Admitting: Family Medicine

## 2021-11-14 ENCOUNTER — Other Ambulatory Visit (HOSPITAL_COMMUNITY): Payer: Self-pay

## 2021-11-14 ENCOUNTER — Encounter: Payer: Self-pay | Admitting: Family Medicine

## 2021-11-14 VITALS — BP 100/70 | HR 72 | Ht 61.0 in | Wt 127.0 lb

## 2021-11-14 DIAGNOSIS — G43711 Chronic migraine without aura, intractable, with status migrainosus: Secondary | ICD-10-CM | POA: Diagnosis not present

## 2021-11-14 MED ORDER — ERENUMAB-AOOE 140 MG/ML ~~LOC~~ SOAJ
140.0000 mg | SUBCUTANEOUS | 3 refills | Status: DC
Start: 2021-11-14 — End: 2022-12-08
  Filled 2021-11-14 – 2021-12-03 (×3): qty 3, 90d supply, fill #0
  Filled 2022-01-13 – 2022-03-11 (×2): qty 3, 90d supply, fill #1
  Filled 2022-06-02: qty 3, 90d supply, fill #2
  Filled 2022-09-04 – 2022-09-13 (×3): qty 3, 90d supply, fill #3

## 2021-11-18 ENCOUNTER — Other Ambulatory Visit (HOSPITAL_COMMUNITY): Payer: Self-pay

## 2021-11-19 ENCOUNTER — Telehealth: Payer: Self-pay | Admitting: *Deleted

## 2021-11-19 NOTE — Telephone Encounter (Signed)
Submitted PA Aimovig on CMM. [id number].  Waiting on determination from [employer]. ?

## 2021-11-19 NOTE — Telephone Encounter (Signed)
The request has been approved. The authorization is effective for a maximum of 6 fills from 11/19/2021 to 05/21/2022, as long as the member is enrolled in their current health plan. The request was approved with a quantity restriction. This has been approved for a max daily dosage of 0.03. A written notification letter will follow with additional details. ?

## 2021-11-20 ENCOUNTER — Other Ambulatory Visit (HOSPITAL_COMMUNITY): Payer: Self-pay

## 2021-11-20 NOTE — Progress Notes (Signed)
11/25/2021 ALL: Erica Rodriguez returns for Botox. We added Amovig injections at last follow up 11/14/2021. She has not picked up from pharmacy. She continues topiramate, propranolol and gabapentin. Having 8-12 headaches per month with 6-7 migrainous headaches. She alternates Ubrelvy, Zofran and Toradol as needed for abortive therapy. Nurtec was not beneficial.  08/29/2021 ALL: Erica Rodriguez returns for Botox. She continues to do well on propranolol, topiramate and Ubrelvy. She reports headaches were fairly well managed until the week prior to Botox was due. She has had to take more Toradol and Ubrelvy over the past week. She is uncertain of triggers but endorses more stress and potential relationship to weather changes. She will continue to monitor.   06/03/2021 ALL: Erica Rodriguez returns for Botox. She is doing well. She continues propranolol, topiramate and Ubrelvy. She doe snot usually have migraines until Botox wears off. She has been having more trouble with TMJ. Masseter injections have been very helpful but she is aware that we will only do Botox protocol today due to insurance requirements.   02/19/2021 ALL: She continues Botox, propranolol 80mg  daily, topiramate 100mg  BID and Ubrelvy. She rarely needs Toradol. She does use cyclobenzaprine for muscle tension leading to headaches. She feels that she is doing really well. May have 1-2 migraines per month, on average. She reports not taking propranolol while on vacation this summer and felt terribly. She feels that LS injection helps with right muscle tension. She has had masseter injections in the past that help with clenching.   11/19/2020 ALL: She feels Botox works very well for about 2.5 months then headaches return. We increased propranolol in 08/2020 to 80mg  ER daily. She reports this has helped with headaches. She continues topiramate 100mg  BID and Ubrelvy 100mg  PRN. She continues to have increased anxiety and feels stress contributes to headaches. Vibryyd worsened  headaches. She was recently started on Wellbutrin. Toradol used sparingly for intractable headaches. She has had more tension in right shoulder/upper back.   08/09/2020 ALL: She is doing well, today. She does feel headaches have been a little more frequent over the past two weeks but feels that recent illness and being off schedule with Botox has played a role. She continues to have about 10 migraines every month. She is considering increasing propranolol. Will wait to see what happens with this procedure. She continues topiramate and Nurtec/Ubrelvy.   04/23/2020 ALL: She is doing well. Botox is helping. She continues topiramate 100mg  BID and propranolol 60mg  daily. She alternates Nurtec and Ubrelvy. She usually uses Nurtec then will take Roselyn Meier if headache continues. She has about 10-12 migraines per month.  Stress is contributor.    Consent Form Botulism Toxin Injection For Chronic Migraine    Reviewed orally with patient, additionally signature is on file:  Botulism toxin has been approved by the Federal drug administration for treatment of chronic migraine. Botulism toxin does not cure chronic migraine and it may not be effective in some patients.  The administration of botulism toxin is accomplished by injecting a small amount of toxin into the muscles of the neck and head. Dosage must be titrated for each individual. Any benefits resulting from botulism toxin tend to wear off after 3 months with a repeat injection required if benefit is to be maintained. Injections are usually done every 3-4 months with maximum effect peak achieved by about 2 or 3 weeks. Botulism toxin is expensive and you should be sure of what costs you will incur resulting from the injection.  The side effects  of botulism toxin use for chronic migraine may include:   -Transient, and usually mild, facial weakness with facial injections  -Transient, and usually mild, head or neck weakness with head/neck  injections  -Reduction or loss of forehead facial animation due to forehead muscle weakness  -Eyelid drooping  -Dry eye  -Pain at the site of injection or bruising at the site of injection  -Double vision  -Potential unknown long term risks   Contraindications: You should not have Botox if you are pregnant, nursing, allergic to albumin, have an infection, skin condition, or muscle weakness at the site of the injection, or have myasthenia gravis, Lambert-Eaton syndrome, or ALS.  It is also possible that as with any injection, there may be an allergic reaction or no effect from the medication. Reduced effectiveness after repeated injections is sometimes seen and rarely infection at the injection site may occur. All care will be taken to prevent these side effects. If therapy is given over a long time, atrophy and wasting in the muscle injected may occur. Occasionally the patient's become refractory to treatment because they develop antibodies to the toxin. In this event, therapy needs to be modified.  I have read the above information and consent to the administration of botulism toxin.    BOTOX PROCEDURE NOTE FOR MIGRAINE HEADACHE  Contraindications and precautions discussed with patient(above). Aseptic procedure was observed and patient tolerated procedure. Procedure performed by Debbora Presto, FNP-C.   The condition has existed for more than 6 months, and pt does not have a diagnosis of ALS, Myasthenia Gravis or Lambert-Eaton Syndrome.  Risks and benefits of injections discussed and pt agrees to proceed with the procedure.  Written consent obtained  These injections are medically necessary. Pt  receives good benefits from these injections. These injections do not cause sedations or hallucinations which the oral therapies may cause.   Description of procedure:  The patient was placed in a sitting position. The standard protocol was used for Botox as follows, with 5 units of Botox injected at  each site:  -Procerus muscle, midline injection  -Corrugator muscle, bilateral injection  -Frontalis muscle, bilateral injection, with 2 sites each side, medial injection was performed in the upper one third of the frontalis muscle, in the region vertical from the medial inferior edge of the superior orbital rim. The lateral injection was again in the upper one third of the forehead vertically above the lateral limbus of the cornea, 1.5 cm lateral to the medial injection site.  -Temporalis muscle injection, 4 sites, bilaterally. The first injection was 3 cm above the tragus of the ear, second injection site was 1.5 cm to 3 cm up from the first injection site in line with the tragus of the ear. The third injection site was 1.5-3 cm forward between the first 2 injection sites. The fourth injection site was 1.5 cm posterior to the second injection site. 5th site laterally in the temporalis  muscleat the level of the outer canthus.  -Occipitalis muscle injection, 3 sites, bilaterally. The first injection was done one half way between the occipital protuberance and the tip of the mastoid process behind the ear. The second injection site was done lateral and superior to the first, 1 fingerbreadth from the first injection. The third injection site was 1 fingerbreadth superiorly and medially from the first injection site.  -Cervical paraspinal muscle injection, 2 sites, bilaterally. The first injection site was 1 cm from the midline of the cervical spine, 3 cm inferior to the lower  border of the occipital protuberance. The second injection site was 1.5 cm superiorly and laterally to the first injection site.  -Trapezius muscle injection was performed at 3 sites, bilaterally. The first injection site was in the upper trapezius muscle halfway between the inflection point of the neck, and the acromion. The second injection site was one half way between the acromion and the first injection site. The third  injection was done between the first injection site and the inflection point of the neck.   Will return for repeat injection in 3 months.   A total of 200 units of Botox was prepared, 155 units of Botox was injected as documented above, 45 units of Botox was wasted. The patient tolerated the procedure well, there were no complications of the above procedure.

## 2021-11-25 ENCOUNTER — Ambulatory Visit: Payer: 59 | Admitting: Family Medicine

## 2021-11-25 ENCOUNTER — Other Ambulatory Visit (HOSPITAL_COMMUNITY): Payer: Self-pay

## 2021-11-25 VITALS — BP 109/75 | HR 78 | Ht 61.0 in | Wt 126.2 lb

## 2021-11-25 DIAGNOSIS — G43711 Chronic migraine without aura, intractable, with status migrainosus: Secondary | ICD-10-CM

## 2021-11-27 ENCOUNTER — Other Ambulatory Visit (HOSPITAL_COMMUNITY): Payer: Self-pay

## 2021-11-27 ENCOUNTER — Encounter: Payer: Self-pay | Admitting: Family Medicine

## 2021-11-29 ENCOUNTER — Other Ambulatory Visit (HOSPITAL_COMMUNITY): Payer: Self-pay

## 2021-11-29 DIAGNOSIS — M65312 Trigger thumb, left thumb: Secondary | ICD-10-CM | POA: Diagnosis not present

## 2021-11-29 DIAGNOSIS — M65311 Trigger thumb, right thumb: Secondary | ICD-10-CM | POA: Diagnosis not present

## 2021-12-03 ENCOUNTER — Other Ambulatory Visit (HOSPITAL_COMMUNITY): Payer: Self-pay

## 2021-12-03 MED ORDER — GABAPENTIN 300 MG PO CAPS
300.0000 mg | ORAL_CAPSULE | Freq: Three times a day (TID) | ORAL | 0 refills | Status: DC
Start: 2021-09-02 — End: 2021-12-30
  Filled 2021-12-03: qty 90, 30d supply, fill #0

## 2021-12-13 ENCOUNTER — Other Ambulatory Visit: Payer: Self-pay | Admitting: Nurse Practitioner

## 2021-12-13 DIAGNOSIS — F5101 Primary insomnia: Secondary | ICD-10-CM

## 2021-12-16 ENCOUNTER — Other Ambulatory Visit (HOSPITAL_COMMUNITY): Payer: Self-pay

## 2021-12-17 ENCOUNTER — Other Ambulatory Visit: Payer: Self-pay | Admitting: Nurse Practitioner

## 2021-12-17 ENCOUNTER — Telehealth: Payer: 59 | Admitting: Nurse Practitioner

## 2021-12-17 ENCOUNTER — Other Ambulatory Visit (HOSPITAL_COMMUNITY): Payer: Self-pay

## 2021-12-17 DIAGNOSIS — F5101 Primary insomnia: Secondary | ICD-10-CM

## 2021-12-17 MED ORDER — ZOLPIDEM TARTRATE 10 MG PO TABS: 10.0000 mg | ORAL_TABLET | Freq: Every evening | ORAL | 0 refills | Status: DC | PRN

## 2021-12-18 DIAGNOSIS — E538 Deficiency of other specified B group vitamins: Secondary | ICD-10-CM

## 2021-12-19 ENCOUNTER — Other Ambulatory Visit (HOSPITAL_COMMUNITY): Payer: Self-pay

## 2021-12-19 MED ORDER — CYANOCOBALAMIN 1000 MCG/ML IJ SOLN
1000.0000 ug | INTRAMUSCULAR | 1 refills | Status: DC
  Filled 2021-12-19: qty 3, 90d supply, fill #0
  Filled 2022-01-22: qty 3, 90d supply, fill #1

## 2021-12-19 NOTE — Telephone Encounter (Signed)
B12 refilled. 

## 2021-12-26 ENCOUNTER — Encounter: Payer: Self-pay | Admitting: Family Medicine

## 2021-12-26 ENCOUNTER — Other Ambulatory Visit: Payer: Self-pay | Admitting: *Deleted

## 2021-12-26 ENCOUNTER — Other Ambulatory Visit: Payer: Self-pay

## 2021-12-26 DIAGNOSIS — G43711 Chronic migraine without aura, intractable, with status migrainosus: Secondary | ICD-10-CM

## 2021-12-27 ENCOUNTER — Ambulatory Visit: Payer: 59

## 2021-12-27 ENCOUNTER — Other Ambulatory Visit (HOSPITAL_COMMUNITY): Payer: Self-pay

## 2021-12-27 ENCOUNTER — Encounter: Payer: Self-pay | Admitting: Family Medicine

## 2021-12-27 DIAGNOSIS — M65312 Trigger thumb, left thumb: Secondary | ICD-10-CM | POA: Diagnosis not present

## 2021-12-27 DIAGNOSIS — M65311 Trigger thumb, right thumb: Secondary | ICD-10-CM | POA: Diagnosis not present

## 2021-12-30 ENCOUNTER — Other Ambulatory Visit: Payer: Self-pay | Admitting: Neurology

## 2021-12-30 ENCOUNTER — Other Ambulatory Visit (HOSPITAL_COMMUNITY): Payer: Self-pay

## 2021-12-30 DIAGNOSIS — G43711 Chronic migraine without aura, intractable, with status migrainosus: Secondary | ICD-10-CM

## 2021-12-30 MED ORDER — KETOROLAC TROMETHAMINE 60 MG/2ML IM SOLN
60.0000 mg | INTRAMUSCULAR | 0 refills | Status: DC
  Filled 2021-12-30: qty 10, 5d supply, fill #0

## 2021-12-30 MED ORDER — GABAPENTIN 300 MG PO CAPS
300.0000 mg | ORAL_CAPSULE | Freq: Three times a day (TID) | ORAL | 0 refills | Status: DC
  Filled 2021-12-30 – 2022-01-02 (×3): qty 90, 30d supply, fill #0

## 2021-12-31 ENCOUNTER — Other Ambulatory Visit (HOSPITAL_COMMUNITY): Payer: Self-pay

## 2022-01-01 DIAGNOSIS — M9902 Segmental and somatic dysfunction of thoracic region: Secondary | ICD-10-CM | POA: Diagnosis not present

## 2022-01-01 DIAGNOSIS — M9904 Segmental and somatic dysfunction of sacral region: Secondary | ICD-10-CM | POA: Diagnosis not present

## 2022-01-01 DIAGNOSIS — M9903 Segmental and somatic dysfunction of lumbar region: Secondary | ICD-10-CM | POA: Diagnosis not present

## 2022-01-01 DIAGNOSIS — M9901 Segmental and somatic dysfunction of cervical region: Secondary | ICD-10-CM | POA: Diagnosis not present

## 2022-01-02 ENCOUNTER — Other Ambulatory Visit (HOSPITAL_COMMUNITY): Payer: Self-pay

## 2022-01-02 ENCOUNTER — Telehealth: Payer: Self-pay | Admitting: Family Medicine

## 2022-01-02 NOTE — Telephone Encounter (Signed)
Pt is asking that her Botox appointment be with Dr Lucia Gaskins and not Amy, NP.  Pt advised Doctors are wanting pt to be established with Doctor's like the NP's.  Pt has still asked this message be sent

## 2022-01-03 ENCOUNTER — Other Ambulatory Visit (HOSPITAL_COMMUNITY): Payer: Self-pay

## 2022-01-04 ENCOUNTER — Other Ambulatory Visit (HOSPITAL_COMMUNITY): Payer: Self-pay

## 2022-01-09 ENCOUNTER — Ambulatory Visit: Payer: 59 | Attending: Neurology

## 2022-01-09 DIAGNOSIS — G43711 Chronic migraine without aura, intractable, with status migrainosus: Secondary | ICD-10-CM | POA: Insufficient documentation

## 2022-01-09 DIAGNOSIS — M542 Cervicalgia: Secondary | ICD-10-CM | POA: Diagnosis not present

## 2022-01-09 DIAGNOSIS — M6281 Muscle weakness (generalized): Secondary | ICD-10-CM | POA: Insufficient documentation

## 2022-01-09 DIAGNOSIS — R293 Abnormal posture: Secondary | ICD-10-CM | POA: Insufficient documentation

## 2022-01-09 NOTE — Therapy (Signed)
OUTPATIENT PHYSICAL THERAPY CERVICAL EVALUATION   Patient Name: Erica Rodriguez MRN: [id number] DOB:1985-07-14, 36 y.o., female Today's Date: 01/10/2022   PT End of Session - 01/09/22 1503     Visit Number 1    Number of Visits 13    Date for PT Re-Evaluation 02/22/22    Authorization Type [facility]    PT Start Time 1510    PT Stop Time 1602    PT Time Calculation (min) 52 min    Activity Tolerance Patient tolerated treatment well    Behavior During Therapy [facility] for tasks assessed/performed             Past Medical History:  Diagnosis Date   Depression    Headache    migraines   Mild preeclampsia    Past Surgical History:  Procedure Laterality Date   CARPAL TUNNEL RELEASE Right 2021   carpel tunnel - left     CESAREAN SECTION     FOOT SURGERY Bilateral    PLANTAR FASCIA RELEASE  2021   ULNAR NERVE REPAIR     WISDOM TOOTH EXTRACTION     Patient Active Problem List   Diagnosis Date Noted   Bilateral hip pain 07/29/2021   Dupuytren's contracture of right hand 07/29/2021   Chronic migraine without aura without status migrainosus, not intractable 02/18/2021   Plantar fasciitis 12/25/2020   Ulnar nerve compression, right 12/25/2020   Bilateral carpal tunnel syndrome 02/15/2020   Bilateral hand pain 02/10/2020   Bilateral wrist pain 02/03/2020   Bilateral hand numbness 02/03/2020   Chronic migraine without aura, with intractable migraine, so stated, with status migrainosus 12/21/2018   Insomnia 10/07/2012   GAD (generalized anxiety disorder) 10/07/2012    PCP: Bennie Pierini, FNP  REFERRING PROVIDER: Anson Fret, MD  REFERRING DIAG: [phone] (ICD-10-CM) - Chronic migraine without aura, with intractable migraine, so stated, with status migrainosus   THERAPY DIAG:  Cervicalgia  Abnormal posture  Muscle weakness (generalized)  Rationale for Evaluation and Treatment Rehabilitation  ONSET DATE: chronic   SUBJECTIVE:                                                                                                                                                                                                          SUBJECTIVE STATEMENT: Patient reports a 7 year history of headaches that have progressively worsened. She has tried various medications without much relief, though has experienced some relief with Botox that she started about 1.5 years ago. On average she has one significant migraine a week that typically last  all day. The migraine begins along the Rt orbital region and courses down her jaw to her Rt posterior neck rated as 10/10. The migraine can cause nausea, vertigo, and visual changes. In addition to one significant migraine a week she has about 2 manageable headaches a week that last for a few hours. Some of her aggravating factors include stress, certain foods, prolonged screen time, and poor sleep. She is currently receiving chiropractor care for same issues. In addition to her migraines she has been having Rt upper trap pain that has been ongoing for about 2 years described as "a knot/tense."   PERTINENT HISTORY:  Has tried multiple medications regimens for headaches; receives Botox every 12 weeks.  Self-reports TMJ dysfunction History of carpal tunnel syndrome bilaterally; both have been surgically treated  Bilateral ulnar nerve release   PAIN:  Are you having pain? No (see subjective above)   PRECAUTIONS: None  WEIGHT BEARING RESTRICTIONS No  FALLS:  Has patient fallen in last 6 months? No  LIVING ENVIRONMENT: Lives with: lives with their family Lives in: House/apartment Stairs: Yes: Internal: 14 steps; on right going up Has following equipment at home: None  OCCUPATION: patient advocate specialist (desk work)   PLOF: Independent  PATIENT GOALS Get rid of the pain   OBJECTIVE:   DIAGNOSTIC FINDINGS:  2016 Brain MRI: IMPRESSION:  This is a normal MRI of the brain with and without contrast. There are no acute  findings.  PATIENT SURVEYS:  FOTO 60% function to 67% predicted   COGNITION: Overall cognitive status: Within functional limits for tasks assessed   SENSATION: Not tested  POSTURE: rounded shoulders and forward head  PALPATION: Referred pain into migraine pattern with Rt UPAs C2,C4, C6  Hypermobility Cervical CPAs, pain free   Referred pain into Rt hand with palpation of Rt upper trap and levator scapulae (trigger points present)   Referred pain into migraine pattern Rt suboccipitals, masseter, SCM  TTP cervical paraspinals   CERVICAL ROM:   Active ROM A/PROM (deg) eval  Flexion 65  Extension 35  Right lateral flexion 35  Left lateral flexion 35  Right rotation 65  Left rotation 50   (Blank rows = not tested)  UPPER EXTREMITY ROM:  Bilateral shoulder AROM WNL and pain free    UPPER EXTREMITY MMT:  MMT Right eval Left eval  Shoulder flexion    Shoulder extension    Shoulder abduction    Shoulder adduction    Shoulder extension    Shoulder internal rotation    Shoulder external rotation    Middle trapezius 3+ 3+  Lower trapezius    Elbow flexion    Elbow extension    Wrist flexion    Wrist extension    Wrist ulnar deviation    Wrist radial deviation    Wrist pronation    Wrist supination    Grip strength     (Blank rows = not tested)  CERVICAL SPECIAL TESTS:  Spurling's test: Negative and Distraction test: Negative     TODAY'S TREATMENT:  [facility] Adult PT Treatment:                                                DATE: 01/09/22 Therapeutic Exercise: Demonstrated and issue initial HEP.   Manual Therapy: STM Rt upper trap, cervical paraspinals Suboccipital release Therapeutic Activity: Education on assessment findings that  will be addressed throughout duration of POC.  Trigger Point Dry Needling Treatment: Pre-treatment instruction: Patient instructed on dry needling rationale, procedures, and possible side effects including pain during treatment  (achy,cramping feeling), bruising, drop of blood, lightheadedness, nausea, sweating. Patient Consent Given: Yes Education handout provided: Yes Muscles treated: Rt upper trap and Rt splenius capitis/cervicis    Treatment response/outcome: Twitch response elicited and Palpable decrease in muscle tension Post-treatment instructions: Patient instructed to expect possible mild to moderate muscle soreness later today and/or tomorrow. Patient instructed in methods to reduce muscle soreness and to continue prescribed HEP. If patient was dry needled over the lung field, patient was instructed on signs and symptoms of pneumothorax and, however unlikely, to see immediate medical attention should they occur. Patient was also educated on signs and symptoms of infection and to seek medical attention should they occur. Patient verbalized understanding of these instructions and education.     PATIENT EDUCATION:  Education details: see treatment Person educated: Patient Education method: Explanation, Demonstration, Tactile cues, Verbal cues, and Handouts Education comprehension: verbalized understanding, returned demonstration, verbal cues required, tactile cues required, and needs further education   HOME EXERCISE PROGRAM: Access Code: [email or web address] URL: [email or web address] Date: 01/09/2022 Prepared by: Letitia Libra  Exercises - Seated Scapular Retraction  - 2 x daily - 7 x weekly - 2 sets - 10 reps - Seated Upper Trapezius Stretch  - 2 x daily - 7 x weekly - 3 sets - 30 sec  hold - Seated Levator Scapulae Stretch  - 2 x daily - 7 x weekly - 3 sets - 30 sec hold - Seated Cervical Retraction  - 2 x daily - 7 x weekly - 2 sets - 10 reps - 5 sec hold  ASSESSMENT:  CLINICAL IMPRESSION: Patient is a 36 y.o. female who was seen today for physical therapy evaluation and treatment for chronic migraines and neck pain. Upon evaluation she is noted to have limited cervical extension and left  rotation AROM, periscapular weakness, postural abnormalities, myofascial trigger points and tautness throughout various cervical/periscapular musculature with specific musculature palpation eliciting migraine pain pattern (see above). She will benefit from skilled PT to address the above stated deficits in order to optimize her function.    OBJECTIVE IMPAIRMENTS decreased ROM, decreased strength, increased fascial restrictions, impaired flexibility, postural dysfunction, and pain.   ACTIVITY LIMITATIONS sleeping and reach over head  PARTICIPATION LIMITATIONS: community activity and occupation  PERSONAL FACTORS Age, Time since onset of injury/illness/exacerbation, and 3+ comorbidities: past surgical history of BUE, depression  are also affecting patient's functional outcome.   REHAB POTENTIAL: Good  CLINICAL DECISION MAKING: Stable/uncomplicated  EVALUATION COMPLEXITY: Low   GOALS: Goals reviewed with patient? No   LONG TERM GOALS: Target date: 02/21/2022  Patient will report either a reduction in weekly migraine intensity or duration to improve tolerance to work tasks.  Baseline: intensity 10/10; duration all day Goal status: INITIAL  2.  Patient will demonstrate at least 60 degrees of Lt cervical rotation AROM to improve ability to complete head turns while driving.  Baseline: see above Goal status: INITIAL  3.  Patient will demonstrate at least 45 degrees of cervical extension AROM to improve ability to complete overhead activities.  Baseline: see above  Goal status: INITIAL  4.  Patient will report no headache pattern of referred pain with palpation of Rt suboccipitals, masseter, SCM indicative of improvements of her current condition.  Baseline: see above  Goal status: INITIAL  5.  Patient will demonstrate at  least 4/5 middle trap strength to improve scapular stability.  Baseline: see above  Goal status: INITIAL  6.  Patient will score at least 67% function on FOTO to  signify clinically meaningful improvement in functional abilities.  Baseline: see above Goal status: INITIAL   PLAN: PT FREQUENCY: 1-2x/week  PT DURATION: other: 4-6 weeks  PLANNED INTERVENTIONS: Therapeutic exercises, Therapeutic activity, Neuromuscular re-education, Patient/Family education, Dry Needling, Electrical stimulation, Spinal manipulation, Spinal mobilization, Cryotherapy, Moist heat, Taping, Manual therapy, and Re-evaluation  PLAN FOR NEXT SESSION: assess response to TPDN; further manual/TPDN to above musculature that causes referred pain, periscapular strengthening  Letitia Libra, PT, DPT, ATC 01/10/22 8:18 AM

## 2022-01-09 NOTE — Patient Instructions (Signed)

## 2022-01-13 ENCOUNTER — Encounter (HOSPITAL_COMMUNITY): Payer: Self-pay | Admitting: Pharmacist

## 2022-01-13 ENCOUNTER — Other Ambulatory Visit (HOSPITAL_COMMUNITY): Payer: Self-pay

## 2022-01-13 ENCOUNTER — Other Ambulatory Visit: Payer: Self-pay | Admitting: Nurse Practitioner

## 2022-01-13 DIAGNOSIS — F411 Generalized anxiety disorder: Secondary | ICD-10-CM

## 2022-01-13 MED ORDER — BUPROPION HCL ER (XL) 300 MG PO TB24
300.0000 mg | ORAL_TABLET | Freq: Every day | ORAL | 1 refills | Status: DC
  Filled 2022-01-13: qty 90, 90d supply, fill #0
  Filled 2022-04-14: qty 90, 90d supply, fill #1

## 2022-01-15 ENCOUNTER — Other Ambulatory Visit (HOSPITAL_COMMUNITY): Payer: Self-pay

## 2022-01-15 ENCOUNTER — Telehealth: Payer: Self-pay

## 2022-01-15 ENCOUNTER — Other Ambulatory Visit: Payer: Self-pay | Admitting: Nurse Practitioner

## 2022-01-15 ENCOUNTER — Ambulatory Visit: Payer: 59

## 2022-01-15 DIAGNOSIS — R293 Abnormal posture: Secondary | ICD-10-CM | POA: Diagnosis not present

## 2022-01-15 DIAGNOSIS — M6281 Muscle weakness (generalized): Secondary | ICD-10-CM | POA: Diagnosis not present

## 2022-01-15 DIAGNOSIS — M542 Cervicalgia: Secondary | ICD-10-CM | POA: Diagnosis not present

## 2022-01-15 DIAGNOSIS — F5101 Primary insomnia: Secondary | ICD-10-CM

## 2022-01-15 DIAGNOSIS — G43711 Chronic migraine without aura, intractable, with status migrainosus: Secondary | ICD-10-CM | POA: Diagnosis not present

## 2022-01-15 MED ORDER — ZOLPIDEM TARTRATE 10 MG PO TABS
10.0000 mg | ORAL_TABLET | Freq: Every evening | ORAL | 0 refills | Status: DC | PRN
  Filled 2022-01-15 – 2022-01-17 (×2): qty 30, 30d supply, fill #0

## 2022-01-15 MED ORDER — ZOLPIDEM TARTRATE 10 MG PO TABS
10.0000 mg | ORAL_TABLET | Freq: Every evening | ORAL | 0 refills | Status: DC | PRN
  Filled 2022-01-15: qty 30, 30d supply, fill #0

## 2022-01-15 NOTE — Telephone Encounter (Signed)
Has an appt with you on July 24 and would like a refill of her Remus Loffler to be sent to [facility] until then. Please review and advise

## 2022-01-15 NOTE — Therapy (Addendum)
OUTPATIENT PHYSICAL THERAPY TREATMENT NOTE PHYSICAL THERAPY DISCHARGE SUMMARY  Visits from Start of Care: 2  Current functional level related to goals / functional outcomes: No formal re-assessment of goals.    Remaining deficits: Status unknown   Education / Equipment: N/a    Patient agrees to discharge. Patient goals were not met. Patient is being discharged due to not returning since the last visit.   Patient Name: Erica Rodriguez MRN: [id number] DOB:05-Mar-1986, 36 y.o., female Today's Date: 01/15/2022  PCP: Chevis Pretty, FNP REFERRING PROVIDER:  Melvenia Beam, MD    END OF SESSION:   PT End of Session - 01/15/22 1701     Visit Number 2    Number of Visits 13    Date for PT Re-Evaluation 02/22/22    Authorization Type [facility]    PT Start Time 1701    PT Stop Time 1744    PT Time Calculation (min) 43 min    Activity Tolerance Patient tolerated treatment well    Behavior During Therapy WFL for tasks assessed/performed             Past Medical History:  Diagnosis Date   Depression    Headache    migraines   Mild preeclampsia    Past Surgical History:  Procedure Laterality Date   CARPAL TUNNEL RELEASE Right 2021   carpel tunnel - left     CESAREAN SECTION     FOOT SURGERY Bilateral    PLANTAR FASCIA RELEASE  2021   ULNAR NERVE REPAIR     WISDOM TOOTH EXTRACTION     Patient Active Problem List   Diagnosis Date Noted   Bilateral hip pain 07/29/2021   Dupuytren's contracture of right hand 07/29/2021   Chronic migraine without aura without status migrainosus, not intractable 02/18/2021   Plantar fasciitis 12/25/2020   Ulnar nerve compression, right 12/25/2020   Bilateral carpal tunnel syndrome 02/15/2020   Bilateral hand pain 02/10/2020   Bilateral wrist pain 02/03/2020   Bilateral hand numbness 02/03/2020   Chronic migraine without aura, with intractable migraine, so stated, with status migrainosus 12/21/2018   Insomnia 10/07/2012   GAD  (generalized anxiety disorder) 10/07/2012    REFERRING DIAG: G43.711 (ICD-10-CM) - Chronic migraine without aura, with intractable migraine, so stated, with status migrainosus     THERAPY DIAG:  Cervicalgia  Abnormal posture  Muscle weakness (generalized)  Rationale for Evaluation and Treatment Rehabilitation  PERTINENT HISTORY: Has tried multiple medications regimens for headaches; receives Botox every 12 weeks.  Self-reports TMJ dysfunction History of carpal tunnel syndrome bilaterally; both have been surgically treated  Bilateral ulnar nerve release   PRECAUTIONS: none  SUBJECTIVE: Patient reports having a headache that started on Monday, which has improved, but pain is still lingering behind the right eye. She reports compliance with HEP. She reports her neck was sore for about 3 days after TPDN.   PAIN:  Are you having pain? Yes: NPRS scale: 3/10 Pain location: Rt eye Pain description: dull Aggravating factors: screen time, lights Relieving factors: closing eyes    OBJECTIVE: (objective measures completed at initial evaluation unless otherwise dated) DIAGNOSTIC FINDINGS:  2016 Brain MRI: IMPRESSION:  This is a normal MRI of the brain with and without contrast. There are no acute findings.   PATIENT SURVEYS:  FOTO 60% function to 67% predicted     COGNITION: Overall cognitive status: Within functional limits for tasks assessed     SENSATION: Not tested   POSTURE: rounded shoulders and forward  head   PALPATION: Referred pain into migraine pattern with Rt UPAs C2,C4, C6   Hypermobility Cervical CPAs, pain free             Referred pain into Rt hand with palpation of Rt upper trap and levator scapulae (trigger points present)             Referred pain into migraine pattern Rt suboccipitals, masseter, SCM            TTP cervical paraspinals    CERVICAL ROM:    Active ROM A/PROM (deg) eval AROM  Flexion 65   Extension 35 50  Right lateral flexion 35    Left lateral flexion 35   Right rotation 65   Left rotation 50    (Blank rows = not tested)   UPPER EXTREMITY ROM:            Bilateral shoulder AROM WNL and pain free      UPPER EXTREMITY MMT:   MMT Right eval Left eval  Shoulder flexion      Shoulder extension      Shoulder abduction      Shoulder adduction      Shoulder extension      Shoulder internal rotation      Shoulder external rotation      Middle trapezius 3+ 3+  Lower trapezius      Elbow flexion      Elbow extension      Wrist flexion      Wrist extension      Wrist ulnar deviation      Wrist radial deviation      Wrist pronation      Wrist supination      Grip strength       (Blank rows = not tested)   CERVICAL SPECIAL TESTS:  Spurling's test: Negative and Distraction test: Negative  Functional Test: convergence 17 cm          TODAY'S TREATMENT:  [facility] Adult PT Treatment:                                                DATE: 01/15/22 Therapeutic Exercise: Cervical extension SNAGS upper and lower 1 x 10 each Cervical rotation SNAGs 1 x 10 left  Convergence x 5 reps  Verbal update to HEP to include convergence  Manual Therapy: STM bilateral upper trap, cervical paraspinals, levator scapulae, Rt masseter, temporalis, SCM Passive bilateral upper trap and levator scap stretching  Suboccipital release Therapeutic Activity: Education on assessment findings that will be addressed throughout duration of POC.  Trigger Point Dry Needling Treatment: Pre-treatment instruction: Patient instructed on dry needling rationale, procedures, and possible side effects including pain during treatment (achy,cramping feeling), bruising, drop of blood, lightheadedness, nausea, sweating. Patient Consent Given: Yes Education handout provided: previous provided  Muscles treated: Rt suboccipitals Rt splenius capitis/cervicis   Treatment response/outcome: Palpable decrease in muscle tension Post-treatment instructions:  Patient instructed to expect possible mild to moderate muscle soreness later today and/or tomorrow. Patient instructed in methods to reduce muscle soreness and to continue prescribed HEP. If patient was dry needled over the lung field, patient was instructed on signs and symptoms of pneumothorax and, however unlikely, to see immediate medical attention should they occur. Patient was also educated on signs and symptoms of infection and to seek medical attention should they  occur. Patient verbalized understanding of these instructions and education.   [facility] Adult PT Treatment:                                                DATE: 01/09/22 Therapeutic Exercise: Demonstrated and issue initial HEP.    Manual Therapy: STM Rt upper trap, cervical paraspinals Suboccipital release Therapeutic Activity: Education on assessment findings that will be addressed throughout duration of POC.  Trigger Point Dry Needling Treatment: Pre-treatment instruction: Patient instructed on dry needling rationale, procedures, and possible side effects including pain during treatment (achy,cramping feeling), bruising, drop of blood, lightheadedness, nausea, sweating. Patient Consent Given: Yes Education handout provided: Yes Muscles treated: Rt upper trap and Rt splenius capitis/cervicis     Treatment response/outcome: Twitch response elicited and Palpable decrease in muscle tension Post-treatment instructions: Patient instructed to expect possible mild to moderate muscle soreness later today and/or tomorrow. Patient instructed in methods to reduce muscle soreness and to continue prescribed HEP. If patient was dry needled over the lung field, patient was instructed on signs and symptoms of pneumothorax and, however unlikely, to see immediate medical attention should they occur. Patient was also educated on signs and symptoms of infection and to seek medical attention should they occur. Patient verbalized understanding of these  instructions and education.         PATIENT EDUCATION:  Education details: HEP Person educated: patient  Education method: instruction, cues  Education comprehension: returned demo   HOME EXERCISE PROGRAM: Access Code: [email or web address] URL: [email or web address] Date: 01/09/2022 Prepared by: Gwendolyn Grant   Exercises - Seated Scapular Retraction  - 2 x daily - 7 x weekly - 2 sets - 10 reps - Seated Upper Trapezius Stretch  - 2 x daily - 7 x weekly - 3 sets - 30 sec  hold - Seated Levator Scapulae Stretch  - 2 x daily - 7 x weekly - 3 sets - 30 sec hold - Seated Cervical Retraction  - 2 x daily - 7 x weekly - 2 sets - 10 reps - 5 sec hold   ASSESSMENT:   CLINICAL IMPRESSION: Patient tolerated session well today with heavy emphasis on manual therapy. TPDN performed to Rt suboccipitals and Rt cervical paraspinals with a decrease is tautness noted post intervention. There ex focused on improving cervical mobility, which she tolerated well. Convergence testing was performed with patient noted to have a deficit, so was encouraged to complete convergence training as part of her HEP. Cervical extension AROM has improved compared to baseline, having met this functional goal.      OBJECTIVE IMPAIRMENTS decreased ROM, decreased strength, increased fascial restrictions, impaired flexibility, postural dysfunction, and pain.    ACTIVITY LIMITATIONS sleeping and reach over head   PARTICIPATION LIMITATIONS: community activity and occupation   PERSONAL FACTORS Age, Time since onset of injury/illness/exacerbation, and 3+ comorbidities: past surgical history of BUE, depression  are also affecting patient's functional outcome.    REHAB POTENTIAL: Good   CLINICAL DECISION MAKING: Stable/uncomplicated   EVALUATION COMPLEXITY: Low     GOALS: Goals reviewed with patient? No     LONG TERM GOALS: Target date: 02/21/2022   Patient will report either a reduction in weekly migraine  intensity or duration to improve tolerance to work tasks.  Baseline: intensity 10/10; duration all day Goal status: INITIAL   2.  Patient will demonstrate at  least 60 degrees of Lt cervical rotation AROM to improve ability to complete head turns while driving.  Baseline: see above Goal status: INITIAL   3.  Patient will demonstrate at least 45 degrees of cervical extension AROM to improve ability to complete overhead activities.  Baseline: see above  Goal status: achieved    4.  Patient will report no headache pattern of referred pain with palpation of Rt suboccipitals, masseter, SCM indicative of improvements of her current condition.  Baseline: see above  Goal status: INITIAL   5.  Patient will demonstrate at least 4/5 middle trap strength to improve scapular stability.  Baseline: see above  Goal status: INITIAL   6.  Patient will score at least 67% function on FOTO to signify clinically meaningful improvement in functional abilities.  Baseline: see above Goal status: INITIAL     PLAN: PT FREQUENCY: 1-2x/week   PT DURATION: other: 4-6 weeks   PLANNED INTERVENTIONS: Therapeutic exercises, Therapeutic activity, Neuromuscular re-education, Patient/Family education, Dry Needling, Electrical stimulation, Spinal manipulation, Spinal mobilization, Cryotherapy, Moist heat, Taping, Manual therapy, and Re-evaluation   PLAN FOR NEXT SESSION: assess response to TPDN; further manual/TPDN to above musculature that causes referred pain, periscapular strengthening  Gwendolyn Grant, PT, DPT, ATC 01/15/22 5:49 PM  Gwendolyn Grant, PT, DPT, ATC 03/24/22 1:25 PM

## 2022-01-17 ENCOUNTER — Ambulatory Visit: Payer: 59 | Admitting: Podiatry

## 2022-01-17 ENCOUNTER — Other Ambulatory Visit (HOSPITAL_COMMUNITY): Payer: Self-pay

## 2022-01-22 ENCOUNTER — Other Ambulatory Visit (HOSPITAL_COMMUNITY): Payer: Self-pay

## 2022-01-23 ENCOUNTER — Ambulatory Visit: Payer: 59

## 2022-01-24 ENCOUNTER — Other Ambulatory Visit (HOSPITAL_COMMUNITY): Payer: Self-pay

## 2022-01-27 ENCOUNTER — Encounter: Payer: Self-pay | Admitting: Nurse Practitioner

## 2022-01-27 ENCOUNTER — Ambulatory Visit (INDEPENDENT_AMBULATORY_CARE_PROVIDER_SITE_OTHER): Payer: 59 | Admitting: Nurse Practitioner

## 2022-01-27 ENCOUNTER — Other Ambulatory Visit (HOSPITAL_COMMUNITY): Payer: Self-pay

## 2022-01-27 VITALS — BP 118/89 | HR 91 | Temp 97.9°F | Resp 20 | Ht 61.0 in | Wt 124.0 lb

## 2022-01-27 DIAGNOSIS — F411 Generalized anxiety disorder: Secondary | ICD-10-CM

## 2022-01-27 DIAGNOSIS — G43711 Chronic migraine without aura, intractable, with status migrainosus: Secondary | ICD-10-CM

## 2022-01-27 DIAGNOSIS — Z0001 Encounter for general adult medical examination with abnormal findings: Secondary | ICD-10-CM | POA: Diagnosis not present

## 2022-01-27 DIAGNOSIS — F5101 Primary insomnia: Secondary | ICD-10-CM

## 2022-01-27 DIAGNOSIS — Z0000 Encounter for general adult medical examination without abnormal findings: Secondary | ICD-10-CM

## 2022-01-27 MED ORDER — QUVIVIQ 25 MG PO TABS
1.0000 | ORAL_TABLET | Freq: Every day | ORAL | 2 refills | Status: DC
Start: 2022-01-27 — End: 2022-02-24
  Filled 2022-01-27: qty 30, 30d supply, fill #0

## 2022-01-27 NOTE — Patient Instructions (Signed)
Daridorexant Tablets What is this medication? DARIDOREXANT (DAR i doe REX ant) treats insomnia. It helps you go to sleep faster and stay asleep through the night. This medicine may be used for other purposes; ask your health care provider or pharmacist if you have questions. COMMON BRAND NAME(S): QUVIVIQ What should I tell my care team before I take this medication? They need to know if you have any of these conditions: Depression Frequently drink alcohol History of a sudden onset of muscle weakness (cataplexy) History of falling asleep often at unexpected times (narcolepsy) History of substance use disorder Liver disease Lung or breathing disease Sleep apnea Sleep-walking, driving, eating or other activity while not fully awake after taking a sleep medication Suicidal thoughts, plans, or attempt by you or a family member An unusual or allergic reaction to daridorexant, other medications, foods, dyes, or preservatives Pregnant or trying to get pregnant Breast-feeding How should I use this medication? Take this medication by mouth with water 30 minutes before going to bed. Take it as directed on the prescription label. It is better to take this medication on an empty stomach. A special MedGuide will be given to you by the pharmacist with each prescription and refill. Be sure to read this information carefully each time. Talk to your care team about the use of this medication in children. Special care may be needed. Overdosage: If you think you have taken too much of this medicine contact a poison control center or emergency room at once. NOTE: This medicine is only for you. Do not share this medicine with others. What if I miss a dose? This does not apply. This medication should only be taken as directed before going to sleep. Do not take double or extra doses. What may interact with this medication? Alcohol Benzodiazepines, such as alprazolam, diazepam, or lorazepam Bosentan Certain  antibiotics, such as erythromycin or clarithromycin Certain antihistamines Certain antivirals for HIV or hepatitis Certain medications for depression, anxiety, or mental health conditions Certain medications for fungal infections, such as itraconazole, ketoconazole, posaconazole, voriconazole Certain medications for seizures, such as carbamazepine, phenytoin, phenobarbital Diltiazem Grapefruit juice Medications that cause drowsiness before a procedure, such as propofol Medications that relax muscles Opioids for pain or cough Other medications that help you fall asleep Phenothiazines, such as chlorpromazine, prochlorperazine, thioridazine Rifampin St. John's wort Verapamil This list may not describe all possible interactions. Give your health care provider a list of all the medicines, herbs, non-prescription drugs, or dietary supplements you use. Also tell them if you smoke, drink alcohol, or use illegal drugs. Some items may interact with your medicine. What should I watch for while using this medication? Visit your care team for regular checks on your progress. Keep a regular sleep schedule by going to bed at about the same time each night. Avoid caffeine-containing drinks in the evening hours. Talk to your care team if your insomnia worsens or is not better within 7 to 10 days. You may do unusual sleep behaviors or activities you do not remember the day after taking this medication. Activities include driving, making or eating food, talking on the phone, sexual activity, or sleep walking. Stop taking this medication and call your care team right away if you find out you have done activities like this. Plan to go to bed and stay in bed for a full night (7 to 8 hours) after you take this medication. You may still be drowsy the morning after taking this medication. This medication may affect your coordination,  reaction time, or judgment. Do not drive or operate machinery until you know how this  medication affects you. Sit up or stand slowly to reduce the risk of dizzy or fainting spells. Watch for new or worsening thoughts of suicide or depression. This includes sudden changes in mood, behaviors, or thoughts. These changes can happen at any time but are more common in the beginning of treatment or after a change in dose. Call your care team right away if you experience these thoughts or worsening depression. After you stop taking this medication, you may have trouble falling asleep. This is called rebound insomnia. This problem usually goes away on its own after 1 or 2 nights. What side effects may I notice from receiving this medication? Side effects that you should report to your care team as soon as possible: Allergic reactions--skin rash, itching, hives, swelling of the face, lips, tongue, or throat CNS depression--slow or shallow breathing, shortness of breath, feeling faint, dizziness, confusion, trouble staying awake Mood and behavior changes--anxiety, nervousness, confusion, hallucinations, irritability, hostility, thoughts of suicide or self-harm, worsening mood, feelings of depression Sudden and temporary muscle weakness Unable to move or speak for several minutes upon waking or going to sleep Unusual sleep behaviors or activities you do not remember such as driving, eating, or sexual activity Side effects that usually do not require medical attention (report these to your care team if they continue or are bothersome): Drowsiness the day after use Fatigue Headache This list may not describe all possible side effects. Call your doctor for medical advice about side effects. You may report side effects to FDA at 1-800-FDA-1088. Where should I keep my medication? Keep out of the reach of children and pets. This medication can be abused. Keep it in a safe place to protect it from theft. Do not share it with anyone. It is only for you. Selling or giving away this medication is  dangerous and against the law. Store between 20 and 25 degrees C (68 and 77 degrees F). Get rid of any unused medication after the expiration date. This medication may cause harm and death if it is taken by other adults, children, or pets. It is important to get rid of the medication as soon as you no longer need it or it is expired. You can do this in two ways: Take the medication to a medication take-back program. Check with your pharmacy or law enforcement to find a location. If you cannot return the medication, check the label or package insert to see if the medication should be thrown out in the garbage or flushed down the toilet. If you are not sure, ask your care team. If it is safe to put it in the trash, take the medication out of the container. Mix the medication with cat litter, dirt, coffee grounds, or other unwanted substance. Seal the mixture in a bag or container. Put it in the trash. NOTE: This sheet is a summary. It may not cover all possible information. If you have questions about this medicine, talk to your doctor, pharmacist, or health care provider.  2023 Elsevier/Gold Standard (2021-08-12 00:00:00)

## 2022-01-27 NOTE — Progress Notes (Signed)
Subjective:    Patient ID: Erica Rodriguez, female    DOB: 07-Mar-1986, 36 y.o.   MRN: [id number]   Chief Complaint: Annual Exam (No pap/)    HPI:  Erica Rodriguez is a 36 y.o. who identifies as a female who was assigned female at birth.   Social history: Lives with: husband Work history: works for [employer] in today for follow up of the following chronic medical issues:  1. Annual physical exam No pap today  2. Chronic migraine without aura, with intractable migraine, so stated, with status migrainosus Is under pretty good control. Has about 1-2 headaches a month  3. Primary insomnia Is on ambien but still only sleeps about 4-5 hours at a time  4. GAD (generalized anxiety disorder) Is on valium as needed and is doing well.   New complaints: None today  No Known Allergies Outpatient Encounter Medications as of 01/27/2022  Medication Sig   buPROPion (WELLBUTRIN XL) 300 MG 24 hr tablet Take 1 tablet (300 mg total) by mouth daily.   cyanocobalamin (,VITAMIN B-12,) 1000 MCG/ML injection Inject 1 mL (1,000 mcg total) into the muscle every 30 days   cyclobenzaprine (FLEXERIL) 5 MG tablet Take 1 tablet (5 mg total) by mouth 3 (three) times daily as needed for muscle spasms   diazepam (VALIUM) 5 MG tablet Take 1 tablet (5 mg total) by mouth every 12 (twelve) hours as needed for anxiety.   Erenumab-aooe 140 MG/ML SOAJ Inject 140 mg into the skin every 30 (thirty) days.   gabapentin (NEURONTIN) 300 MG capsule Take 1 capsule (300 mg total) by mouth 3 (three) times daily.   ketorolac (TORADOL) 60 MG/2ML SOLN injection Inject 2 mLs (60 mg total) into the muscle, at onset of migraine. May repeat in 6 hours. Use a max of 2 times daily and 4 days a month.   MIRENA, 52 MG, 20 MCG/24HR IUD    OnabotulinumtoxinA (BOTOX IM) Inject into the muscle. 155units IM to head and neck every 3 months for migraines.   ondansetron (ZOFRAN-ODT) 4 MG disintegrating tablet Take 1-2 tablets (4-8 mg  total) by mouth every 8 (eight) hours as needed for nausea or vomiting.   propranolol ER (INDERAL LA) 80 MG 24 hr capsule Take 1 capsule (80 mg total) by mouth daily.   Rimegepant Sulfate (NURTEC) 75 MG TBDP Take 1 tablet (75 mg) by mouth once daily as needed for up to 1 dose. Try to take as soon as possible for migraine.   topiramate (TOPAMAX) 100 MG tablet Take 1 tablet (100 mg total) by mouth 2 (two) times daily.   Ubrogepant (UBRELVY) 100 MG TABS Take 100 mg by mouth daily as needed. Take one tablet at onset of headache, may repeat 1 tablet in 2 hours, no more than 2 tablets in 24 hours   zolpidem (AMBIEN) 10 MG tablet Take 1 tablet (10 mg total) by mouth at bedtime as needed for sleep.   [DISCONTINUED] gabapentin (NEURONTIN) 300 MG capsule Take 1 capsule (300 mg total) by mouth 3 (three) times daily.   Facility-Administered Encounter Medications as of 01/27/2022  Medication   cyanocobalamin ((VITAMIN B-12)) injection 1,000 mcg    Past Surgical History:  Procedure Laterality Date   CARPAL TUNNEL RELEASE Right 2021   carpel tunnel - left     CESAREAN SECTION     FOOT SURGERY Bilateral    PLANTAR FASCIA RELEASE  2021   ULNAR NERVE REPAIR  WISDOM TOOTH EXTRACTION      Family History  Problem Relation Age of Onset   Thyroid disease Mother    Hyperlipidemia Mother    Arthritis/Rheumatoid Mother    Diabetes Mother    Hypertension Father    Kidney disease Father    Cirrhosis Father    Liver cancer Father    Diabetes Maternal Grandmother    Stroke Maternal Grandmother    Breast cancer Maternal Grandmother    Stroke Maternal Grandfather    Healthy Son       Controlled substance contract: n/a     Review of Systems  Constitutional:  Negative for diaphoresis.  Eyes:  Negative for pain.  Respiratory:  Negative for shortness of breath.   Cardiovascular:  Negative for chest pain, palpitations and leg swelling.  Gastrointestinal:  Negative for abdominal pain.  Endocrine:  Negative for polydipsia.  Skin:  Negative for rash.  Neurological:  Negative for dizziness, weakness and headaches.  Hematological:  Does not bruise/bleed easily.  All other systems reviewed and are negative.      Objective:   Physical Exam Vitals and nursing note reviewed.  Constitutional:      General: She is not in acute distress.    Appearance: Normal appearance. She is well-developed.  HENT:     Head: Normocephalic.     Right Ear: Tympanic membrane normal.     Left Ear: Tympanic membrane normal.     Nose: Nose normal.     Mouth/Throat:     Mouth: Mucous membranes are moist.  Eyes:     Pupils: Pupils are equal, round, and reactive to light.  Neck:     Vascular: No carotid bruit or JVD.  Cardiovascular:     Rate and Rhythm: Normal rate and regular rhythm.     Heart sounds: Normal heart sounds.  Pulmonary:     Effort: Pulmonary effort is normal. No respiratory distress.     Breath sounds: Normal breath sounds. No wheezing or rales.  Chest:     Chest wall: No tenderness.  Abdominal:     General: Bowel sounds are normal. There is no distension or abdominal bruit.     Palpations: Abdomen is soft. There is no hepatomegaly, splenomegaly, mass or pulsatile mass.     Tenderness: There is no abdominal tenderness.  Musculoskeletal:        General: Normal range of motion.     Cervical back: Normal range of motion and neck supple.  Lymphadenopathy:     Cervical: No cervical adenopathy.  Skin:    General: Skin is warm and dry.  Neurological:     Mental Status: She is alert and oriented to person, place, and time.     Deep Tendon Reflexes: Reflexes are normal and symmetric.  Psychiatric:        Behavior: Behavior normal.        Thought Content: Thought content normal.        Judgment: Judgment normal.    BP 118/89   Pulse 91   Temp 97.9 F (36.6 C) (Temporal)   Resp 20   Ht '5\' 1"'  (1.549 m)   Wt 124 lb (56.2 kg)   SpO2 100%   BMI 23.43 kg/m         Assessment &  Plan:  Erica Rodriguez comes in today with chief complaint of Annual Exam (No pap/)   Diagnosis and orders addressed:  1. Annual physical exam Labs oending - CBC with Differential/Platelet - CMP14+EGFR -  Lipid panel - Thyroid Panel With TSH  2. Chronic migraine without aura, with intractable migraine, so stated, with status migrainosus Contiunue currents meds Avoid caffeine  3. Primary insomnia Stop ambien Try Quviviq 38m nightly- patient wil let me know if helps  4. GAD (generalized anxiety disorder) Stress management   Labs pending Health Maintenance reviewed Diet and exercise encouraged  Follow up plan: 6 months   [place] FNP

## 2022-01-28 ENCOUNTER — Other Ambulatory Visit (HOSPITAL_COMMUNITY): Payer: Self-pay

## 2022-01-28 LAB — THYROID PANEL WITH TSH
Free Thyroxine Index: 2.2 (ref 1.2–4.9)
T3 Uptake Ratio: 27 % (ref 24–39)
T4, Total: 8.2 ug/dL (ref 4.5–12.0)
TSH: 1.19 u[IU]/mL (ref 0.450–4.500)

## 2022-01-28 LAB — CBC WITH DIFFERENTIAL/PLATELET
Basophils Absolute: 0 10*3/uL (ref 0.0–0.2)
Basos: 0 %
EOS (ABSOLUTE): 0.2 10*3/uL (ref 0.0–0.4)
Eos: 3 %
Hematocrit: 39.9 % (ref 34.0–46.6)
Hemoglobin: 13.2 g/dL (ref 11.1–15.9)
Immature Grans (Abs): 0 10*3/uL (ref 0.0–0.1)
Immature Granulocytes: 0 %
Lymphocytes Absolute: 1.5 10*3/uL (ref 0.7–3.1)
Lymphs: 28 %
MCH: 32 pg (ref 26.6–33.0)
MCHC: 33.1 g/dL (ref 31.5–35.7)
MCV: 97 fL (ref 79–97)
Monocytes Absolute: 0.4 10*3/uL (ref 0.1–0.9)
Monocytes: 7 %
Neutrophils Absolute: 3.2 10*3/uL (ref 1.4–7.0)
Neutrophils: 62 %
Platelets: 369 10*3/uL (ref 150–450)
RBC: 4.13 x10E6/uL (ref 3.77–5.28)
RDW: 11.7 % (ref 11.7–15.4)
WBC: 5.3 10*3/uL (ref 3.4–10.8)

## 2022-01-28 LAB — CMP14+EGFR
ALT: 12 IU/L (ref 0–32)
AST: 14 IU/L (ref 0–40)
Albumin/Globulin Ratio: 1.7 (ref 1.2–2.2)
Albumin: 4.2 g/dL (ref 3.9–4.9)
Alkaline Phosphatase: 67 IU/L (ref 44–121)
BUN/Creatinine Ratio: 15 (ref 9–23)
BUN: 12 mg/dL (ref 6–20)
Bilirubin Total: 0.3 mg/dL (ref 0.0–1.2)
CO2: 20 mmol/L (ref 20–29)
Calcium: 9.2 mg/dL (ref 8.7–10.2)
Chloride: 109 mmol/L — ABNORMAL HIGH (ref 96–106)
Creatinine, Ser: 0.78 mg/dL (ref 0.57–1.00)
Globulin, Total: 2.5 g/dL (ref 1.5–4.5)
Glucose: 91 mg/dL (ref 70–99)
Potassium: 4.7 mmol/L (ref 3.5–5.2)
Sodium: 140 mmol/L (ref 134–144)
Total Protein: 6.7 g/dL (ref 6.0–8.5)
eGFR: 101 mL/min/{1.73_m2} (ref >59)

## 2022-01-28 LAB — LIPID PANEL
Chol/HDL Ratio: 3 ratio (ref 0.0–4.4)
Cholesterol, Total: 141 mg/dL (ref 100–199)
HDL: 47 mg/dL (ref >39)
LDL Chol Calc (NIH): 84 mg/dL (ref 0–99)
Triglycerides: 45 mg/dL (ref 0–149)
VLDL Cholesterol Cal: 10 mg/dL (ref 5–40)

## 2022-01-29 ENCOUNTER — Other Ambulatory Visit (HOSPITAL_COMMUNITY): Payer: Self-pay

## 2022-01-29 ENCOUNTER — Ambulatory Visit: Payer: 59 | Admitting: Podiatry

## 2022-01-29 ENCOUNTER — Other Ambulatory Visit: Payer: Self-pay | Admitting: *Deleted

## 2022-01-29 ENCOUNTER — Encounter: Payer: Self-pay | Admitting: Family Medicine

## 2022-01-29 ENCOUNTER — Ambulatory Visit: Payer: 59

## 2022-01-29 DIAGNOSIS — G43711 Chronic migraine without aura, intractable, with status migrainosus: Secondary | ICD-10-CM

## 2022-01-29 MED ORDER — METHYLPREDNISOLONE 4 MG PO TBPK
ORAL_TABLET | ORAL | 0 refills | Status: DC
  Filled 2022-01-29: qty 21, 6d supply, fill #0

## 2022-01-30 ENCOUNTER — Other Ambulatory Visit (HOSPITAL_COMMUNITY): Payer: Self-pay

## 2022-01-30 MED ORDER — GABAPENTIN 300 MG PO CAPS
ORAL_CAPSULE | ORAL | 0 refills | Status: DC
  Filled 2022-01-30: qty 90, 30d supply, fill #0

## 2022-01-31 ENCOUNTER — Telehealth: Payer: Self-pay

## 2022-01-31 DIAGNOSIS — M65311 Trigger thumb, right thumb: Secondary | ICD-10-CM | POA: Diagnosis not present

## 2022-01-31 DIAGNOSIS — M542 Cervicalgia: Secondary | ICD-10-CM | POA: Diagnosis not present

## 2022-01-31 DIAGNOSIS — M65312 Trigger thumb, left thumb: Secondary | ICD-10-CM | POA: Diagnosis not present

## 2022-01-31 NOTE — Telephone Encounter (Signed)
Joscelin Sammons Key: [id number] - PA Case ID: [id number] help? Call us at [phone] Status Sent to [facility] Quviviq 25MG  tablets Form Med[employer] ePA Form 2017 NCPDP

## 2022-02-03 ENCOUNTER — Other Ambulatory Visit: Payer: Self-pay | Admitting: Physician Assistant

## 2022-02-03 ENCOUNTER — Other Ambulatory Visit (HOSPITAL_COMMUNITY): Payer: Self-pay | Admitting: Physician Assistant

## 2022-02-03 DIAGNOSIS — M542 Cervicalgia: Secondary | ICD-10-CM

## 2022-02-04 ENCOUNTER — Other Ambulatory Visit: Payer: Self-pay | Admitting: Nurse Practitioner

## 2022-02-04 ENCOUNTER — Other Ambulatory Visit (HOSPITAL_COMMUNITY): Payer: Self-pay

## 2022-02-04 DIAGNOSIS — F5101 Primary insomnia: Secondary | ICD-10-CM

## 2022-02-04 MED ORDER — ZOLPIDEM TARTRATE 10 MG PO TABS
10.0000 mg | ORAL_TABLET | Freq: Every evening | ORAL | 5 refills | Status: DC | PRN
  Filled 2022-02-04 – 2022-02-14 (×2): qty 30, 30d supply, fill #0
  Filled 2022-03-14: qty 30, 30d supply, fill #1
  Filled 2022-04-14: qty 30, 30d supply, fill #2
  Filled 2022-05-12: qty 30, 30d supply, fill #3
  Filled 2022-06-11: qty 30, 30d supply, fill #4
  Filled 2022-07-15: qty 30, 30d supply, fill #5

## 2022-02-04 NOTE — Progress Notes (Signed)
Meds ordered this encounter  Medications   zolpidem (AMBIEN) 10 MG tablet    Sig: Take 1 tablet (10 mg total) by mouth at bedtime as needed for sleep.    Dispense:  30 tablet    Refill:  5    Order Specific Question:   Supervising Provider    Answer:   Nils Pyle [[id number]]   [name], FNP

## 2022-02-05 NOTE — Telephone Encounter (Signed)
Deniedon August 1 This request has not been approved. Based on the information submitted for review, you did not meet our guideline rules for the requested drug. In order for your request to be approved, your provider would need to show that you have met the guideline rules below. The details below are written in medical language. If you have questions, please contact your provider. In some cases, the requested medication or alternatives offered may have additional approval requirements. Your provider requested Dwana Curd to manage your condition of insomnia (difficultly falling or staying asleep).For the treatment of insomnia, our guideline named DARIDOREXANT (DHRCBUL) requires that you had a trial of or contraindication (harmful for) to TWO generic insomnia medications (such as eszopiclone, zaleplon, zolpidem) AND Belsomra. Your doctor told us you have tired zolpidem and Belsomra. This request was denied because we did not receive information that you meet the requirement listed above. Please work with your doctor to use a different medication or get [place] more information if it will allow [place] to approve this request. A written notification letter will follow with additional details

## 2022-02-06 ENCOUNTER — Ambulatory Visit: Payer: 59 | Admitting: Physical Therapy

## 2022-02-09 ENCOUNTER — Telehealth: Payer: 59 | Admitting: Nurse Practitioner

## 2022-02-09 DIAGNOSIS — B9689 Other specified bacterial agents as the cause of diseases classified elsewhere: Secondary | ICD-10-CM

## 2022-02-09 DIAGNOSIS — J019 Acute sinusitis, unspecified: Secondary | ICD-10-CM | POA: Diagnosis not present

## 2022-02-09 MED ORDER — AMOXICILLIN-POT CLAVULANATE 875-125 MG PO TABS: 1.0000 | ORAL_TABLET | Freq: Two times a day (BID) | ORAL | 0 refills | Status: AC

## 2022-02-09 NOTE — Progress Notes (Signed)

## 2022-02-09 NOTE — Progress Notes (Signed)
I have spent 5 minutes in review of e-visit questionnaire, review and updating patient chart, medical decision making and response to patient.  ° °Chisa Kushner W Dillinger Aston, NP ° °  °

## 2022-02-11 ENCOUNTER — Other Ambulatory Visit (HOSPITAL_COMMUNITY): Payer: Self-pay

## 2022-02-11 DIAGNOSIS — M5412 Radiculopathy, cervical region: Secondary | ICD-10-CM | POA: Diagnosis not present

## 2022-02-13 ENCOUNTER — Ambulatory Visit: Payer: 59 | Admitting: Physical Therapy

## 2022-02-14 ENCOUNTER — Other Ambulatory Visit (HOSPITAL_COMMUNITY): Payer: Self-pay

## 2022-02-14 DIAGNOSIS — M5412 Radiculopathy, cervical region: Secondary | ICD-10-CM | POA: Diagnosis not present

## 2022-02-17 ENCOUNTER — Ambulatory Visit: Payer: 59 | Admitting: Family Medicine

## 2022-02-20 ENCOUNTER — Telehealth: Payer: 59 | Admitting: Physician Assistant

## 2022-02-20 DIAGNOSIS — N898 Other specified noninflammatory disorders of vagina: Secondary | ICD-10-CM | POA: Diagnosis not present

## 2022-02-20 MED ORDER — FLUCONAZOLE 150 MG PO TABS: ORAL_TABLET | ORAL | 0 refills | Status: DC

## 2022-02-20 NOTE — Progress Notes (Signed)

## 2022-02-24 ENCOUNTER — Ambulatory Visit: Payer: 59 | Admitting: Neurology

## 2022-02-24 ENCOUNTER — Encounter: Payer: Self-pay | Admitting: Neurology

## 2022-02-24 ENCOUNTER — Ambulatory Visit: Payer: 59 | Admitting: Family Medicine

## 2022-02-24 ENCOUNTER — Other Ambulatory Visit (HOSPITAL_COMMUNITY): Payer: Self-pay

## 2022-02-24 DIAGNOSIS — G43711 Chronic migraine without aura, intractable, with status migrainosus: Secondary | ICD-10-CM

## 2022-02-24 DIAGNOSIS — G43909 Migraine, unspecified, not intractable, without status migrainosus: Secondary | ICD-10-CM

## 2022-02-24 MED ORDER — ONABOTULINUMTOXINA 200 UNITS IJ SOLR
155.0000 [IU] | Freq: Once | INTRAMUSCULAR | Status: AC
  Administered 2022-02-24: 155 [IU] via INTRAMUSCULAR

## 2022-02-24 MED ORDER — ZAVZPRET 10 MG/ACT NA SOLN
1.0000 | Freq: Every day | NASAL | 11 refills | Status: DC | PRN
  Filled 2022-02-24: qty 6, 28d supply, fill #0
  Filled 2022-03-24: qty 6, 6d supply, fill #0

## 2022-02-24 NOTE — Progress Notes (Signed)
02/24/2022: Still having 4 migraine days a month. However acute management doesn't appear to be working as well. Dorris Singh, nurtec, sumatriptan, rizatriptan, amerge(naratriptan). Will try the new Zavzpret for acute management as the oral meds still take hours and do not resolve the migraine acutely entirely. She feels the Aimovig has helped. She has tried Buyer, retail. Also tried propranolol, topiramate, amitriptyline. At baseline she has chronic migraines, currently 4 migraine days a month and < 10 total headache days a month. Can also try Quilpta and if does well we can switch from aimovig to [place]. In the meantime will try Zavzpret.  Meds ordered this encounter  Medications   botulinum toxin Type A (BOTOX) injection 155 Units    Botox- 200 units x 1 vial Lot: [id number] Expiration: 02/202 NDC: 6962-9528-41  Bacteriostatic 0.9% Sodium Chloride- 55mL total Lot: [id number] Expiration: 02/05/2023 NDC: 0272-5366-44  Dx: I34.742 B/B   Zavegepant HCl (ZAVZPRET) 10 MG/ACT SOLN    Sig: Place 1 spray into the nose daily as needed. One spray daily as needed for migraine as soon as possible.    Dispense:  8 each    Refill:  11    Has copay card.      11/25/2021 ALL: Erica Rodriguez returns for Botox. We added Amovig injections at last follow up 11/14/2021. She has not picked up from pharmacy. She continues topiramate, propranolol and gabapentin. Having 8-12 headaches per month with 6-7 migrainous headaches. She alternates Ubrelvy, Zofran and Toradol as needed for abortive therapy. Nurtec was not beneficial.  08/29/2021 ALL: Erica Rodriguez returns for Botox. She continues to do well on propranolol, topiramate and Ubrelvy. She reports headaches were fairly well managed until the week prior to Botox was due. She has had to take more Toradol and Ubrelvy over the past week. She is uncertain of triggers but endorses more stress and potential relationship to weather changes. She will continue to monitor.    06/03/2021 ALL: Erica Rodriguez returns for Botox. She is doing well. She continues propranolol, topiramate and Ubrelvy. She doe snot usually have migraines until Botox wears off. She has been having more trouble with TMJ. Masseter injections have been very helpful but she is aware that we will only do Botox protocol today due to insurance requirements.   02/19/2021 ALL: She continues Botox, propranolol 80mg  daily, topiramate 100mg  BID and Ubrelvy. She rarely needs Toradol. She does use cyclobenzaprine for muscle tension leading to headaches. She feels that she is doing really well. May have 1-2 migraines per month, on average. She reports not taking propranolol while on vacation this summer and felt terribly. She feels that LS injection helps with right muscle tension. She has had masseter injections in the past that help with clenching.   11/19/2020 ALL: She feels Botox works very well for about 2.5 months then headaches return. We increased propranolol in 08/2020 to 80mg  ER daily. She reports this has helped with headaches. She continues topiramate 100mg  BID and Ubrelvy 100mg  PRN. She continues to have increased anxiety and feels stress contributes to headaches. Vibryyd worsened headaches. She was recently started on Wellbutrin. Toradol used sparingly for intractable headaches. She has had more tension in right shoulder/upper back.   08/09/2020 ALL: She is doing well, today. She does feel headaches have been a little more frequent over the past two weeks but feels that recent illness and being off schedule with Botox has played a role. She continues to have about 10 migraines every month. She is considering increasing propranolol. Will  wait to see what happens with this procedure. She continues topiramate and Nurtec/Ubrelvy.   04/23/2020 ALL: She is doing well. Botox is helping. She continues topiramate 100mg  BID and propranolol 60mg  daily. She alternates Nurtec and Ubrelvy. She usually uses Nurtec then will take  if headache continues. She has about 10-12 migraines per month.  Stress is contributor.    Consent Form Botulism Toxin Injection For Chronic Migraine    Reviewed orally with patient, additionally signature is on file:  Botulism toxin has been approved by the Federal drug administration for treatment of chronic migraine. Botulism toxin does not cure chronic migraine and it may not be effective in some patients.  The administration of botulism toxin is accomplished by injecting a small amount of toxin into the muscles of the neck and head. Dosage must be titrated for each individual. Any benefits resulting from botulism toxin tend to wear off after 3 months with a repeat injection required if benefit is to be maintained. Injections are usually done every 3-4 months with maximum effect peak achieved by about 2 or 3 weeks. Botulism toxin is expensive and you should be sure of what costs you will incur resulting from the injection.  The side effects of botulism toxin use for chronic migraine may include:   -Transient, and usually mild, facial weakness with facial injections  -Transient, and usually mild, head or neck weakness with head/neck injections  -Reduction or loss of forehead facial animation due to forehead muscle weakness  -Eyelid drooping  -Dry eye  -Pain at the site of injection or bruising at the site of injection  -Double vision  -Potential unknown long term risks   Contraindications: You should not have Botox if you are pregnant, nursing, allergic to albumin, have an infection, skin condition, or muscle weakness at the site of the injection, or have myasthenia gravis, Lambert-Eaton syndrome, or ALS.  It is also possible that as with any injection, there may be an allergic reaction or no effect from the medication. Reduced effectiveness after repeated injections is sometimes seen and rarely infection at the injection site may occur. All care will be taken to prevent these  side effects. If therapy is given over a long time, atrophy and wasting in the muscle injected may occur. Occasionally the patient's become refractory to treatment because they develop antibodies to the toxin. In this event, therapy needs to be modified.  I have read the above information and consent to the administration of botulism toxin.    BOTOX PROCEDURE NOTE FOR MIGRAINE HEADACHE  Contraindications and precautions discussed with patient(above). Aseptic procedure was observed and patient tolerated procedure. Procedure performed by , FNP-C.   The condition has existed for more than 6 months, and pt does not have a diagnosis of ALS, Myasthenia Gravis or Lambert-Eaton Syndrome.  Risks and benefits of injections discussed and pt agrees to proceed with the procedure.  Written consent obtained  These injections are medically necessary. Pt  receives good benefits from these injections. These injections do not cause sedations or hallucinations which the oral therapies may cause.   Description of procedure:  The patient was placed in a sitting position. The standard protocol was used for Botox as follows, with 5 units of Botox injected at each site:  -Procerus muscle, midline injection  -Corrugator muscle, bilateral injection  -Frontalis muscle, bilateral injection, with 2 sites each side, medial injection was performed in the upper one third of the frontalis muscle, in the region vertical from the  medial inferior edge of the superior orbital rim. The lateral injection was again in the upper one third of the forehead vertically above the lateral limbus of the cornea, 1.5 cm lateral to the medial injection site.  -Temporalis muscle injection, 4 sites, bilaterally. The first injection was 3 cm above the tragus of the ear, second injection site was 1.5 cm to 3 cm up from the first injection site in line with the tragus of the ear. The third injection site was 1.5-3 cm forward between the  first 2 injection sites. The fourth injection site was 1.5 cm posterior to the second injection site. 5th site laterally in the temporalis  muscleat the level of the outer canthus.  -Occipitalis muscle injection, 3 sites, bilaterally. The first injection was done one half way between the occipital protuberance and the tip of the mastoid process behind the ear. The second injection site was done lateral and superior to the first, 1 fingerbreadth from the first injection. The third injection site was 1 fingerbreadth superiorly and medially from the first injection site.  -Cervical paraspinal muscle injection, 2 sites, bilaterally. The first injection site was 1 cm from the midline of the cervical spine, 3 cm inferior to the lower border of the occipital protuberance. The second injection site was 1.5 cm superiorly and laterally to the first injection site.  -Trapezius muscle injection was performed at 3 sites, bilaterally. The first injection site was in the upper trapezius muscle halfway between the inflection point of the neck, and the acromion. The second injection site was one half way between the acromion and the first injection site. The third injection was done between the first injection site and the inflection point of the neck.   Will return for repeat injection in 3 months.   A total of 200 units of Botox was prepared, 155 units of Botox was injected as documented above, 45 units of Botox was wasted. The patient tolerated the procedure well, there were no complications of the above procedure.

## 2022-02-24 NOTE — Progress Notes (Signed)
Botox- 200 units x 1 vial Lot: [id number] Expiration: 02/202 NDC: 6808-8110-31  Bacteriostatic 0.9% Sodium Chloride- 64mL total Lot: [id number] Expiration: 02/05/2023 NDC: 9292-4462-86  Dx: N81.771 B/B

## 2022-02-25 ENCOUNTER — Other Ambulatory Visit (HOSPITAL_COMMUNITY): Payer: Self-pay

## 2022-02-26 ENCOUNTER — Telehealth: Payer: Self-pay | Admitting: *Deleted

## 2022-02-26 NOTE — Telephone Encounter (Signed)
Erica Rodriguez Key: [id number] - PA Case ID: [id number]    PA Zavzpret complete waiting on approval

## 2022-02-27 ENCOUNTER — Other Ambulatory Visit (HOSPITAL_COMMUNITY): Payer: Self-pay

## 2022-02-27 MED ORDER — GABAPENTIN 300 MG PO CAPS
ORAL_CAPSULE | ORAL | 0 refills | Status: DC
  Filled 2022-02-27: qty 90, 30d supply, fill #0

## 2022-03-03 DIAGNOSIS — S161XXA Strain of muscle, fascia and tendon at neck level, initial encounter: Secondary | ICD-10-CM | POA: Diagnosis not present

## 2022-03-05 ENCOUNTER — Ambulatory Visit: Payer: 59 | Admitting: Family Medicine

## 2022-03-11 ENCOUNTER — Other Ambulatory Visit (HOSPITAL_COMMUNITY): Payer: Self-pay

## 2022-03-12 ENCOUNTER — Other Ambulatory Visit (HOSPITAL_COMMUNITY): Payer: Self-pay

## 2022-03-12 DIAGNOSIS — M5412 Radiculopathy, cervical region: Secondary | ICD-10-CM | POA: Diagnosis not present

## 2022-03-12 DIAGNOSIS — G894 Chronic pain syndrome: Secondary | ICD-10-CM | POA: Diagnosis not present

## 2022-03-12 DIAGNOSIS — M79642 Pain in left hand: Secondary | ICD-10-CM | POA: Diagnosis not present

## 2022-03-12 DIAGNOSIS — M79641 Pain in right hand: Secondary | ICD-10-CM | POA: Diagnosis not present

## 2022-03-13 ENCOUNTER — Encounter: Payer: Self-pay | Admitting: Neurology

## 2022-03-14 ENCOUNTER — Other Ambulatory Visit (HOSPITAL_COMMUNITY): Payer: Self-pay

## 2022-03-17 ENCOUNTER — Encounter: Payer: Self-pay | Admitting: *Deleted

## 2022-03-17 NOTE — Telephone Encounter (Signed)
[employer] [place]) Casilda Carls has been denied on appeal.Will  fax over letter of denial.

## 2022-03-20 ENCOUNTER — Other Ambulatory Visit (HOSPITAL_COMMUNITY): Payer: Self-pay

## 2022-03-24 ENCOUNTER — Other Ambulatory Visit: Payer: Self-pay | Admitting: Nurse Practitioner

## 2022-03-24 ENCOUNTER — Other Ambulatory Visit (HOSPITAL_COMMUNITY): Payer: Self-pay

## 2022-03-24 DIAGNOSIS — E538 Deficiency of other specified B group vitamins: Secondary | ICD-10-CM

## 2022-03-24 MED ORDER — CYANOCOBALAMIN 1000 MCG/ML IJ SOLN
1000.0000 ug | INTRAMUSCULAR | 1 refills | Status: DC
  Filled 2022-03-24: qty 3, fill #0
  Filled 2022-03-24: qty 3, 90d supply, fill #0
  Filled 2022-06-11: qty 3, 90d supply, fill #1

## 2022-03-25 ENCOUNTER — Other Ambulatory Visit (HOSPITAL_COMMUNITY): Payer: Self-pay

## 2022-03-25 MED ORDER — GABAPENTIN 300 MG PO CAPS
300.0000 mg | ORAL_CAPSULE | Freq: Two times a day (BID) | ORAL | 0 refills | Status: DC
  Filled 2022-03-25: qty 120, 30d supply, fill #0

## 2022-03-26 NOTE — Telephone Encounter (Signed)
Had completed appeal on cover my meds and insurance denied that attempt. States the patient has to NOT be able to tolerate POs in order for it to be approved. Appeal # [phone]

## 2022-03-29 DIAGNOSIS — M5412 Radiculopathy, cervical region: Secondary | ICD-10-CM | POA: Diagnosis not present

## 2022-04-14 ENCOUNTER — Other Ambulatory Visit (HOSPITAL_COMMUNITY): Payer: Self-pay

## 2022-04-18 ENCOUNTER — Other Ambulatory Visit (HOSPITAL_COMMUNITY): Payer: Self-pay

## 2022-04-18 ENCOUNTER — Other Ambulatory Visit: Payer: Self-pay | Admitting: Neurology

## 2022-04-18 DIAGNOSIS — G43711 Chronic migraine without aura, intractable, with status migrainosus: Secondary | ICD-10-CM

## 2022-04-24 ENCOUNTER — Encounter: Payer: Self-pay | Admitting: Neurology

## 2022-04-24 ENCOUNTER — Other Ambulatory Visit (HOSPITAL_COMMUNITY): Payer: Self-pay

## 2022-04-24 DIAGNOSIS — M5412 Radiculopathy, cervical region: Secondary | ICD-10-CM | POA: Diagnosis not present

## 2022-04-24 MED ORDER — KETOROLAC TROMETHAMINE 60 MG/2ML IM SOLN
60.0000 mg | INTRAMUSCULAR | 0 refills | Status: DC
  Filled 2022-04-24: qty 10, 5d supply, fill #0

## 2022-04-25 ENCOUNTER — Other Ambulatory Visit (HOSPITAL_COMMUNITY): Payer: Self-pay

## 2022-04-25 DIAGNOSIS — G5603 Carpal tunnel syndrome, bilateral upper limbs: Secondary | ICD-10-CM | POA: Diagnosis not present

## 2022-04-25 MED ORDER — GABAPENTIN 300 MG PO CAPS
300.0000 mg | ORAL_CAPSULE | Freq: Two times a day (BID) | ORAL | 2 refills | Status: DC
  Filled 2022-04-25: qty 120, 30d supply, fill #0

## 2022-04-28 ENCOUNTER — Other Ambulatory Visit (HOSPITAL_COMMUNITY): Payer: Self-pay

## 2022-04-28 DIAGNOSIS — Z79899 Other long term (current) drug therapy: Secondary | ICD-10-CM | POA: Diagnosis not present

## 2022-04-28 DIAGNOSIS — M5412 Radiculopathy, cervical region: Secondary | ICD-10-CM | POA: Diagnosis not present

## 2022-04-28 DIAGNOSIS — M25511 Pain in right shoulder: Secondary | ICD-10-CM | POA: Diagnosis not present

## 2022-04-28 DIAGNOSIS — Z5181 Encounter for therapeutic drug level monitoring: Secondary | ICD-10-CM | POA: Diagnosis not present

## 2022-04-28 DIAGNOSIS — G894 Chronic pain syndrome: Secondary | ICD-10-CM | POA: Diagnosis not present

## 2022-04-28 MED ORDER — HYDROCODONE-ACETAMINOPHEN 5-325 MG PO TABS
1.0000 | ORAL_TABLET | Freq: Three times a day (TID) | ORAL | 0 refills | Status: DC | PRN
  Filled 2022-04-28: qty 30, 10d supply, fill #0

## 2022-04-29 DIAGNOSIS — M9903 Segmental and somatic dysfunction of lumbar region: Secondary | ICD-10-CM | POA: Diagnosis not present

## 2022-04-29 DIAGNOSIS — M9901 Segmental and somatic dysfunction of cervical region: Secondary | ICD-10-CM | POA: Diagnosis not present

## 2022-04-29 DIAGNOSIS — M9902 Segmental and somatic dysfunction of thoracic region: Secondary | ICD-10-CM | POA: Diagnosis not present

## 2022-04-29 DIAGNOSIS — M9904 Segmental and somatic dysfunction of sacral region: Secondary | ICD-10-CM | POA: Diagnosis not present

## 2022-05-06 ENCOUNTER — Telehealth: Payer: 59 | Admitting: Physician Assistant

## 2022-05-06 ENCOUNTER — Other Ambulatory Visit: Payer: Self-pay

## 2022-05-06 DIAGNOSIS — B9789 Other viral agents as the cause of diseases classified elsewhere: Secondary | ICD-10-CM | POA: Diagnosis not present

## 2022-05-06 DIAGNOSIS — J019 Acute sinusitis, unspecified: Secondary | ICD-10-CM | POA: Diagnosis not present

## 2022-05-07 ENCOUNTER — Other Ambulatory Visit: Payer: Self-pay

## 2022-05-07 ENCOUNTER — Other Ambulatory Visit (HOSPITAL_COMMUNITY): Payer: Self-pay

## 2022-05-07 DIAGNOSIS — G43909 Migraine, unspecified, not intractable, without status migrainosus: Secondary | ICD-10-CM

## 2022-05-07 DIAGNOSIS — R110 Nausea: Secondary | ICD-10-CM

## 2022-05-07 MED ORDER — AMOXICILLIN-POT CLAVULANATE 875-125 MG PO TABS: 1.0000 | ORAL_TABLET | Freq: Two times a day (BID) | ORAL | 0 refills | Status: DC

## 2022-05-07 MED ORDER — AZELASTINE HCL 0.1 % NA SOLN: 2.0000 | Freq: Two times a day (BID) | NASAL | 0 refills | Status: DC

## 2022-05-07 MED ORDER — ONDANSETRON 4 MG PO TBDP
4.0000 mg | ORAL_TABLET | Freq: Three times a day (TID) | ORAL | 11 refills | Status: DC | PRN
  Filled 2022-05-07: qty 30, 5d supply, fill #0
  Filled 2022-07-02: qty 30, 5d supply, fill #1
  Filled 2022-09-29: qty 30, 5d supply, fill #2
  Filled 2022-12-30: qty 30, 5d supply, fill #3
  Filled 2023-03-03: qty 30, 5d supply, fill #4
  Filled 2023-04-06: qty 30, 5d supply, fill #5

## 2022-05-07 NOTE — Progress Notes (Signed)
E-Visit for Sinus Problems  We are sorry that you are not feeling well.  Here is how we plan to help!  Based on what you have shared with me it looks like you have sinusitis.  Sinusitis is inflammation and infection in the sinus cavities of the head.  Based on your presentation I believe you most likely have Acute Viral Sinusitis.This is an infection most likely caused by a virus. There is not specific treatment for viral sinusitis other than to help you with the symptoms until the infection runs its course.  You may use an oral decongestant such as Mucinex D or if you have glaucoma or high blood pressure use plain Mucinex. Saline nasal spray help and can safely be used as often as needed for congestion, I have prescribed: Azelastine nasal spray 2 sprays in each nostril twice a day  Some authorities believe that zinc sprays or the use of Echinacea may shorten the course of your symptoms.  Sinus infections are not as easily transmitted as other respiratory infection, however we still recommend that you avoid close contact with loved ones, especially the very young and elderly.  Remember to wash your hands thoroughly throughout the day as this is the number one way to prevent the spread of infection!  Home Care: Only take medications as instructed by your medical team. Do not take these medications with alcohol. A steam or ultrasonic humidifier can help congestion.  You can place a towel over your head and breathe in the steam from hot water coming from a faucet. Avoid close contacts especially the very young and the elderly. Cover your mouth when you cough or sneeze. Always remember to wash your hands.  Get Help Right Away If: You develop worsening fever or sinus pain. You develop a severe head ache or visual changes. Your symptoms persist after you have completed your treatment plan.  Make sure you Understand these instructions. Will watch your condition. Will get help right away if you are  not doing well or get worse.   Thank you for choosing an e-visit.  Your e-visit answers were reviewed by a board certified advanced clinical practitioner to complete your personal care plan. Depending upon the condition, your plan could have included both over the counter or prescription medications.  Please review your pharmacy choice. Make sure the pharmacy is open so you can pick up prescription now. If there is a problem, you may contact your provider through MyChart messaging and have the prescription routed to another pharmacy.  Your safety is important to us. If you have drug allergies check your prescription carefully.   For the next 24 hours you can use MyChart to ask questions about today's visit, request a non-urgent call back, or ask for a work or school excuse. You will get an email in the next two days asking about your experience. I hope that your e-visit has been valuable and will speed your recovery.  I have spent 5 minutes in review of e-visit questionnaire, review and updating patient chart, medical decision making and response to patient.   Allura Doepke M Haelie Clapp, PA-C  

## 2022-05-07 NOTE — Addendum Note (Signed)
Addended by: Mar Daring on: 05/07/2022 09:55 AM   Modules accepted: Orders

## 2022-05-08 ENCOUNTER — Other Ambulatory Visit (HOSPITAL_COMMUNITY): Payer: Self-pay

## 2022-05-13 ENCOUNTER — Other Ambulatory Visit (HOSPITAL_COMMUNITY): Payer: Self-pay

## 2022-05-13 ENCOUNTER — Telehealth: Payer: Self-pay | Admitting: Neurology

## 2022-05-13 NOTE — Telephone Encounter (Signed)
PA approved for the patient authorization is effective from 05/13/2022 to 05/13/2023

## 2022-05-13 NOTE — Telephone Encounter (Signed)
PA completed on CMM/[employer] KEY: [id number] Will await determination

## 2022-05-15 DIAGNOSIS — M9904 Segmental and somatic dysfunction of sacral region: Secondary | ICD-10-CM | POA: Diagnosis not present

## 2022-05-15 DIAGNOSIS — M9901 Segmental and somatic dysfunction of cervical region: Secondary | ICD-10-CM | POA: Diagnosis not present

## 2022-05-15 DIAGNOSIS — M9905 Segmental and somatic dysfunction of pelvic region: Secondary | ICD-10-CM | POA: Diagnosis not present

## 2022-05-15 DIAGNOSIS — M9903 Segmental and somatic dysfunction of lumbar region: Secondary | ICD-10-CM | POA: Diagnosis not present

## 2022-05-17 DIAGNOSIS — M5412 Radiculopathy, cervical region: Secondary | ICD-10-CM | POA: Diagnosis not present

## 2022-05-19 ENCOUNTER — Encounter: Payer: Self-pay | Admitting: Neurology

## 2022-05-19 ENCOUNTER — Ambulatory Visit: Payer: 59 | Admitting: Neurology

## 2022-05-20 ENCOUNTER — Ambulatory Visit: Payer: 59 | Admitting: Family Medicine

## 2022-05-21 ENCOUNTER — Ambulatory Visit: Payer: 59 | Admitting: Neurology

## 2022-05-21 ENCOUNTER — Encounter: Payer: Self-pay | Admitting: Neurology

## 2022-05-21 DIAGNOSIS — G43711 Chronic migraine without aura, intractable, with status migrainosus: Secondary | ICD-10-CM

## 2022-05-21 MED ORDER — ONABOTULINUMTOXINA 200 UNITS IJ SOLR
155.0000 [IU] | Freq: Once | INTRAMUSCULAR | Status: AC
  Administered 2022-05-21: 155 [IU] via INTRAMUSCULAR

## 2022-05-21 NOTE — Progress Notes (Unsigned)
May 21, 2022: Patient's zavzpret was not approved.  Her Aimovig was approved. She is doing excellent great improvement.   02/24/2022: Still having 4 migraine days a month. However acute management doesn't appear to be working as well. Dorris Singh, nurtec, sumatriptan, rizatriptan, amerge(naratriptan). Will try the new Zavzpret for acute management as the oral meds still take hours and do not resolve the migraine acutely entirely. She feels the Aimovig has helped. She has tried Buyer, retail. Also tried propranolol, topiramate, amitriptyline. At baseline she has chronic migraines, currently 4 migraine days a month and < 10 total headache days a month. Can also try Quilpta and if does well we can switch from aimovig to [place]. In the meantime will try Zavzpret.  Meds ordered this encounter  Medications   botulinum toxin Type A (BOTOX) injection 155 Units    Botox- 200 units x 1 vial Lot: [id number] Expiration: 08/2024 NDC: 8119-1478-29  Bacteriostatic 0.9% Sodium Chloride- 52mL total Lot: [id number] Expiration: 03/08/2023 NDC: 8657-8469-62  Dx: X52.841 B/B      11/25/2021 ALL: Blake Divine returns for Botox. We added Amovig injections at last follow up 11/14/2021. She has not picked up from pharmacy. She continues topiramate, propranolol and gabapentin. Having 8-12 headaches per month with 6-7 migrainous headaches. She alternates Ubrelvy, Zofran and Toradol as needed for abortive therapy. Nurtec was not beneficial.  08/29/2021 ALL: Kimanh returns for Botox. She continues to do well on propranolol, topiramate and Ubrelvy. She reports headaches were fairly well managed until the week prior to Botox was due. She has had to take more Toradol and Ubrelvy over the past week. She is uncertain of triggers but endorses more stress and potential relationship to weather changes. She will continue to monitor.   06/03/2021 ALL: Shandale returns for Botox. She is doing well. She continues propranolol,  topiramate and Ubrelvy. She doe snot usually have migraines until Botox wears off. She has been having more trouble with TMJ. Masseter injections have been very helpful but she is aware that we will only do Botox protocol today due to insurance requirements.   02/19/2021 ALL: She continues Botox, propranolol 80mg  daily, topiramate 100mg  BID and Ubrelvy. She rarely needs Toradol. She does use cyclobenzaprine for muscle tension leading to headaches. She feels that she is doing really well. May have 1-2 migraines per month, on average. She reports not taking propranolol while on vacation this summer and felt terribly. She feels that LS injection helps with right muscle tension. She has had masseter injections in the past that help with clenching.   11/19/2020 ALL: She feels Botox works very well for about 2.5 months then headaches return. We increased propranolol in 08/2020 to 80mg  ER daily. She reports this has helped with headaches. She continues topiramate 100mg  BID and Ubrelvy 100mg  PRN. She continues to have increased anxiety and feels stress contributes to headaches. Vibryyd worsened headaches. She was recently started on Wellbutrin. Toradol used sparingly for intractable headaches. She has had more tension in right shoulder/upper back.   08/09/2020 ALL: She is doing well, today. She does feel headaches have been a little more frequent over the past two weeks but feels that recent illness and being off schedule with Botox has played a role. She continues to have about 10 migraines every month. She is considering increasing propranolol. Will wait to see what happens with this procedure. She continues topiramate and Nurtec/Ubrelvy.   04/23/2020 ALL: She is doing well. Botox is helping. She continues topiramate 100mg  BID and propranolol  60mg  daily. She alternates Nurtec and Ubrelvy. She usually uses Nurtec then will take if headache continues. She has about 10-12 migraines per month.  Stress is  contributor.    Consent Form Botulism Toxin Injection For Chronic Migraine    Reviewed orally with patient, additionally signature is on file:  Botulism toxin has been approved by the Federal drug administration for treatment of chronic migraine. Botulism toxin does not cure chronic migraine and it may not be effective in some patients.  The administration of botulism toxin is accomplished by injecting a small amount of toxin into the muscles of the neck and head. Dosage must be titrated for each individual. Any benefits resulting from botulism toxin tend to wear off after 3 months with a repeat injection required if benefit is to be maintained. Injections are usually done every 3-4 months with maximum effect peak achieved by about 2 or 3 weeks. Botulism toxin is expensive and you should be sure of what costs you will incur resulting from the injection.  The side effects of botulism toxin use for chronic migraine may include:   -Transient, and usually mild, facial weakness with facial injections  -Transient, and usually mild, head or neck weakness with head/neck injections  -Reduction or loss of forehead facial animation due to forehead muscle weakness  -Eyelid drooping  -Dry eye  -Pain at the site of injection or bruising at the site of injection  -Double vision  -Potential unknown long term risks   Contraindications: You should not have Botox if you are pregnant, nursing, allergic to albumin, have an infection, skin condition, or muscle weakness at the site of the injection, or have myasthenia gravis, Lambert-Eaton syndrome, or ALS.  It is also possible that as with any injection, there may be an allergic reaction or no effect from the medication. Reduced effectiveness after repeated injections is sometimes seen and rarely infection at the injection site may occur. All care will be taken to prevent these side effects. If therapy is given over a long time, atrophy and wasting in the  muscle injected may occur. Occasionally the patient's become refractory to treatment because they develop antibodies to the toxin. In this event, therapy needs to be modified.  I have read the above information and consent to the administration of botulism toxin.    BOTOX PROCEDURE NOTE FOR MIGRAINE HEADACHE  Contraindications and precautions discussed with patient(above). Aseptic procedure was observed and patient tolerated procedure. Procedure performed by Bernita Raisin, FNP-C.   The condition has existed for more than 6 months, and pt does not have a diagnosis of ALS, Myasthenia Gravis or Lambert-Eaton Syndrome.  Risks and benefits of injections discussed and pt agrees to proceed with the procedure.  Written consent obtained  These injections are medically necessary. Pt  receives good benefits from these injections. These injections do not cause sedations or hallucinations which the oral therapies may cause.   Description of procedure:  The patient was placed in a sitting position. The standard protocol was used for Botox as follows, with 5 units of Botox injected at each site:  -Procerus muscle, midline injection  -Corrugator muscle, bilateral injection  -Frontalis muscle, bilateral injection, with 2 sites each side, medial injection was performed in the upper one third of the frontalis muscle, in the region vertical from the medial inferior edge of the superior orbital rim. The lateral injection was again in the upper one third of the forehead vertically above the lateral limbus of the cornea, 1.5 cm  lateral to the medial injection site.  -Temporalis muscle injection, 4 sites, bilaterally. The first injection was 3 cm above the tragus of the ear, second injection site was 1.5 cm to 3 cm up from the first injection site in line with the tragus of the ear. The third injection site was 1.5-3 cm forward between the first 2 injection sites. The fourth injection site was 1.5 cm posterior to the  second injection site. 5th site laterally in the temporalis  muscleat the level of the outer canthus.  -Occipitalis muscle injection, 3 sites, bilaterally. The first injection was done one half way between the occipital protuberance and the tip of the mastoid process behind the ear. The second injection site was done lateral and superior to the first, 1 fingerbreadth from the first injection. The third injection site was 1 fingerbreadth superiorly and medially from the first injection site.  -Cervical paraspinal muscle injection, 2 sites, bilaterally. The first injection site was 1 cm from the midline of the cervical spine, 3 cm inferior to the lower border of the occipital protuberance. The second injection site was 1.5 cm superiorly and laterally to the first injection site.  -Trapezius muscle injection was performed at 3 sites, bilaterally. The first injection site was in the upper trapezius muscle halfway between the inflection point of the neck, and the acromion. The second injection site was one half way between the acromion and the first injection site. The third injection was done between the first injection site and the inflection point of the neck.   Will return for repeat injection in 3 months.   A total of 200 units of Botox was prepared, 155 units of Botox was injected as documented above, 45 units of Botox was wasted. The patient tolerated the procedure well, there were no complications of the above procedure.

## 2022-05-21 NOTE — Progress Notes (Unsigned)
Botox- 200 units x 1 vial Lot: [id number] Expiration: 08/2024 NDC: 9741-6384-53  Bacteriostatic 0.9% Sodium Chloride- 8mL total Lot: [id number] Expiration: 03/08/2023 NDC: 2122-4825-00  Dx: B70.488 B/B

## 2022-05-26 ENCOUNTER — Other Ambulatory Visit (HOSPITAL_COMMUNITY): Payer: Self-pay

## 2022-05-26 MED ORDER — GABAPENTIN 300 MG PO CAPS
600.0000 mg | ORAL_CAPSULE | Freq: Four times a day (QID) | ORAL | 0 refills | Status: DC
  Filled 2022-05-26: qty 240, 30d supply, fill #0

## 2022-05-27 ENCOUNTER — Other Ambulatory Visit (HOSPITAL_COMMUNITY): Payer: Self-pay

## 2022-06-02 ENCOUNTER — Encounter: Payer: Self-pay | Admitting: Neurology

## 2022-06-02 DIAGNOSIS — G43709 Chronic migraine without aura, not intractable, without status migrainosus: Secondary | ICD-10-CM

## 2022-06-03 ENCOUNTER — Other Ambulatory Visit (HOSPITAL_COMMUNITY): Payer: Self-pay

## 2022-06-03 MED ORDER — UBRELVY 100 MG PO TABS
100.0000 mg | ORAL_TABLET | Freq: Every day | ORAL | 11 refills | Status: DC | PRN
  Filled 2022-06-03: qty 16, 48d supply, fill #0
  Filled 2022-06-03: qty 16, 30d supply, fill #0
  Filled 2022-07-04: qty 16, 30d supply, fill #1
  Filled 2022-08-04: qty 16, 30d supply, fill #2
  Filled 2022-09-04: qty 16, 30d supply, fill #3
  Filled 2022-10-14: qty 16, 30d supply, fill #4
  Filled 2022-11-13: qty 16, 30d supply, fill #5
  Filled 2022-12-09: qty 16, 30d supply, fill #6
  Filled 2023-01-13: qty 16, 30d supply, fill #7
  Filled 2023-02-09: qty 16, 30d supply, fill #8
  Filled 2023-03-09: qty 16, 30d supply, fill #9
  Filled 2023-04-06: qty 16, 30d supply, fill #10
  Filled 2023-05-11: qty 16, 30d supply, fill #11

## 2022-06-03 NOTE — Telephone Encounter (Signed)
Ubrelvy quantity changed to #16/30 per v.o. Dr Lucia Gaskins.

## 2022-06-05 ENCOUNTER — Other Ambulatory Visit (HOSPITAL_COMMUNITY): Payer: Self-pay

## 2022-06-05 DIAGNOSIS — M5412 Radiculopathy, cervical region: Secondary | ICD-10-CM | POA: Diagnosis not present

## 2022-06-09 ENCOUNTER — Other Ambulatory Visit (HOSPITAL_COMMUNITY): Payer: Self-pay

## 2022-06-09 MED ORDER — HYDROCODONE-ACETAMINOPHEN 5-325 MG PO TABS
1.0000 | ORAL_TABLET | Freq: Three times a day (TID) | ORAL | 0 refills | Status: DC | PRN
  Filled 2022-06-09: qty 30, 10d supply, fill #0

## 2022-06-10 DIAGNOSIS — M25511 Pain in right shoulder: Secondary | ICD-10-CM | POA: Diagnosis not present

## 2022-06-10 DIAGNOSIS — M79641 Pain in right hand: Secondary | ICD-10-CM | POA: Diagnosis not present

## 2022-06-12 ENCOUNTER — Other Ambulatory Visit (HOSPITAL_COMMUNITY): Payer: Self-pay

## 2022-06-21 ENCOUNTER — Other Ambulatory Visit (HOSPITAL_COMMUNITY): Payer: Self-pay

## 2022-06-23 ENCOUNTER — Encounter: Payer: Self-pay | Admitting: Neurology

## 2022-06-23 ENCOUNTER — Other Ambulatory Visit (HOSPITAL_COMMUNITY): Payer: Self-pay

## 2022-06-23 ENCOUNTER — Other Ambulatory Visit: Payer: Self-pay

## 2022-06-23 DIAGNOSIS — G43709 Chronic migraine without aura, not intractable, without status migrainosus: Secondary | ICD-10-CM

## 2022-06-23 MED ORDER — GABAPENTIN 300 MG PO CAPS
600.0000 mg | ORAL_CAPSULE | Freq: Four times a day (QID) | ORAL | 0 refills | Status: DC
  Filled 2022-06-23: qty 240, 30d supply, fill #0

## 2022-06-24 ENCOUNTER — Other Ambulatory Visit: Payer: Self-pay | Admitting: Nurse Practitioner

## 2022-06-24 ENCOUNTER — Other Ambulatory Visit: Payer: Self-pay

## 2022-06-24 ENCOUNTER — Other Ambulatory Visit (HOSPITAL_COMMUNITY): Payer: Self-pay

## 2022-06-24 DIAGNOSIS — M47812 Spondylosis without myelopathy or radiculopathy, cervical region: Secondary | ICD-10-CM | POA: Diagnosis not present

## 2022-06-24 DIAGNOSIS — G43709 Chronic migraine without aura, not intractable, without status migrainosus: Secondary | ICD-10-CM

## 2022-06-24 MED ORDER — PROPRANOLOL HCL ER 80 MG PO CP24
80.0000 mg | ORAL_CAPSULE | Freq: Every day | ORAL | 3 refills | Status: DC
  Filled 2022-06-24: qty 90, 90d supply, fill #0
  Filled 2022-10-14: qty 90, 90d supply, fill #1
  Filled 2023-02-09: qty 90, 90d supply, fill #2
  Filled 2023-05-28: qty 90, 90d supply, fill #3

## 2022-06-25 ENCOUNTER — Other Ambulatory Visit: Payer: Self-pay

## 2022-07-01 DIAGNOSIS — M79641 Pain in right hand: Secondary | ICD-10-CM | POA: Diagnosis not present

## 2022-07-02 ENCOUNTER — Encounter: Payer: Self-pay | Admitting: Neurology

## 2022-07-02 DIAGNOSIS — M5412 Radiculopathy, cervical region: Secondary | ICD-10-CM | POA: Diagnosis not present

## 2022-07-02 DIAGNOSIS — M47812 Spondylosis without myelopathy or radiculopathy, cervical region: Secondary | ICD-10-CM | POA: Diagnosis not present

## 2022-07-02 DIAGNOSIS — M791 Myalgia, unspecified site: Secondary | ICD-10-CM | POA: Diagnosis not present

## 2022-07-02 DIAGNOSIS — G894 Chronic pain syndrome: Secondary | ICD-10-CM | POA: Diagnosis not present

## 2022-07-03 ENCOUNTER — Other Ambulatory Visit (HOSPITAL_COMMUNITY): Payer: Self-pay

## 2022-07-03 ENCOUNTER — Other Ambulatory Visit: Payer: Self-pay

## 2022-07-04 ENCOUNTER — Other Ambulatory Visit (HOSPITAL_COMMUNITY): Payer: Self-pay

## 2022-07-04 ENCOUNTER — Encounter: Payer: Self-pay | Admitting: Podiatry

## 2022-07-04 ENCOUNTER — Ambulatory Visit: Payer: 59 | Admitting: Podiatry

## 2022-07-04 DIAGNOSIS — M722 Plantar fascial fibromatosis: Secondary | ICD-10-CM | POA: Diagnosis not present

## 2022-07-04 MED ORDER — TRIAMCINOLONE ACETONIDE 10 MG/ML IJ SUSP
20.0000 mg | Freq: Once | INTRAMUSCULAR | Status: AC
  Administered 2022-07-04: 20 mg

## 2022-07-04 NOTE — Progress Notes (Signed)
Subjective:   Patient ID: Erica Rodriguez, female   DOB: 36 y.o.   MRN: [id number]   HPI Patient presents with severe pain in the heels of both feet and states that she feels like she needs to stretch them more and that is hard for her to do consistent with   ROS      Objective:  Physical Exam  Neurovascular status intact exquisite discomfort noted medial fascial band bilateral with fluid buildup around the insertional point of the tendon into the calcaneus.  Patient does have pain worse after periods of sitting when getting up in the morning     Assessment:  Acute plantar fasciitis bilateral with inflammation fluid     Plan:  H&P reviewed condition and since it has been 10 months I went ahead and did sterile prep reinjected the fascia at insertion 3 mg Kenalog 5 mg Xylocaine and since she has such trouble with stretching I did dispense 2 night splints to use but I like her to sleep in it and use her to 20-minute periods along with ice therapy which was dispensed.  Reappoint as symptoms indicate may require other treatment depending on response

## 2022-07-10 DIAGNOSIS — M79642 Pain in left hand: Secondary | ICD-10-CM | POA: Diagnosis not present

## 2022-07-10 DIAGNOSIS — M79641 Pain in right hand: Secondary | ICD-10-CM | POA: Diagnosis not present

## 2022-07-15 ENCOUNTER — Other Ambulatory Visit (HOSPITAL_COMMUNITY): Payer: Self-pay

## 2022-07-15 ENCOUNTER — Other Ambulatory Visit: Payer: Self-pay

## 2022-07-15 MED ORDER — HYDROCODONE-ACETAMINOPHEN 5-325 MG PO TABS
1.0000 | ORAL_TABLET | Freq: Three times a day (TID) | ORAL | 0 refills | Status: DC | PRN
  Filled 2022-07-15 – 2022-07-19 (×3): qty 45, 15d supply, fill #0

## 2022-07-18 ENCOUNTER — Encounter: Payer: Self-pay | Admitting: Nurse Practitioner

## 2022-07-18 ENCOUNTER — Ambulatory Visit: Payer: Commercial Managed Care - PPO | Admitting: Nurse Practitioner

## 2022-07-18 ENCOUNTER — Other Ambulatory Visit: Payer: Self-pay

## 2022-07-18 ENCOUNTER — Other Ambulatory Visit (HOSPITAL_COMMUNITY): Payer: Self-pay

## 2022-07-18 VITALS — BP 104/79 | HR 83 | Temp 97.7°F | Resp 20 | Ht 61.0 in | Wt 117.0 lb

## 2022-07-18 DIAGNOSIS — F411 Generalized anxiety disorder: Secondary | ICD-10-CM | POA: Diagnosis not present

## 2022-07-18 DIAGNOSIS — G43711 Chronic migraine without aura, intractable, with status migrainosus: Secondary | ICD-10-CM | POA: Diagnosis not present

## 2022-07-18 DIAGNOSIS — F5101 Primary insomnia: Secondary | ICD-10-CM

## 2022-07-18 MED ORDER — BUPROPION HCL ER (XL) 300 MG PO TB24
300.0000 mg | ORAL_TABLET | Freq: Every day | ORAL | 1 refills | Status: DC
  Filled 2022-07-18: qty 90, 90d supply, fill #0
  Filled 2022-10-20: qty 90, 90d supply, fill #1

## 2022-07-18 MED ORDER — ZOLPIDEM TARTRATE 10 MG PO TABS
10.0000 mg | ORAL_TABLET | Freq: Every evening | ORAL | 5 refills | Status: DC | PRN
  Filled 2022-07-18 – 2022-08-15 (×2): qty 30, 30d supply, fill #0
  Filled 2022-09-09 – 2022-09-15 (×2): qty 30, 30d supply, fill #1
  Filled 2022-10-14: qty 30, 30d supply, fill #2
  Filled 2022-11-13: qty 30, 30d supply, fill #3
  Filled 2022-12-09 – 2022-12-12 (×2): qty 30, 30d supply, fill #4
  Filled 2023-01-13: qty 30, 30d supply, fill #5

## 2022-07-18 NOTE — Progress Notes (Signed)
Subjective:    Patient ID: Erica Rodriguez, female    DOB: Jun 18, 1986, 37 y.o.   MRN: [id number]   Chief Complaint: medical management of chronic issues     HPI:  Erica Rodriguez is a 37 y.o. who identifies as a female who was assigned female at birth.   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  Comes in today for follow up of the following chronic medical issues:  1. Chronic migraine without aura, with intractable migraine, so stated, with status migrainosus Is on inderal and topamax for prevention. Takes ubrevely as needed when she has a migraine. Has migraine 3-4 x a month  but they are manageable. i 2. GAD (generalized anxiety disorder) Is o wellbutrin and is doing well.    07/18/2022    2:31 PM 01/27/2022    9:40 AM 10/10/2021   10:54 AM 06/21/2021    3:17 PM  GAD 7 : Generalized Anxiety Score  Nervous, Anxious, on Edge 1 0 1 0  Control/stop worrying 1 1 1 1   Worry too much - different things 0 2 2 1   Trouble relaxing 0 2 1 1   Restless 0 0 0 0  Easily annoyed or irritable 1 2 2 3   Afraid - awful might happen 0 0 0 0  Total GAD 7 Score 3 7 7 6   Anxiety Difficulty Not difficult at all Not difficult at all Not difficult at all Not difficult at all      3. Primary insomnia Takes ambien to sleep ar night. Sleeps about 6 hours a night.   New complaints: None today  No Known Allergies Outpatient Encounter Medications as of 07/18/2022  Medication Sig   buPROPion (WELLBUTRIN XL) 300 MG 24 hr tablet Take 1 tablet (300 mg total) by mouth daily.   cyanocobalamin (VITAMIN B12) 1000 MCG/ML injection Inject 1 mL (1,000 mcg total) into the muscle every 30 days   cyclobenzaprine (FLEXERIL) 5 MG tablet Take 1 tablet (5 mg total) by mouth 3 (three) times daily as needed for muscle spasms   diazepam (VALIUM) 5 MG tablet Take 1 tablet (5 mg total) by mouth every 12 (twelve) hours as needed for  anxiety.   Erenumab-aooe 140 MG/ML SOAJ Inject 140 mg into the skin every 30 (thirty) days.   gabapentin (NEURONTIN) 300 MG capsule Take 1 capsule (300 mg total) by mouth 2 (two) times daily and 2 capsules at bedtime   gabapentin (NEURONTIN) 300 MG capsule Take 2 capsules (600 mg total) by mouth 4 (four) times daily.   gabapentin (NEURONTIN) 300 MG capsule Take 2 capsules (600 mg total) by mouth 4 (four) times daily.   HYDROcodone-acetaminophen (NORCO/VICODIN) 5-325 MG tablet Take 1 tablet by mouth 3 (three) times daily as needed for 15 days   ketorolac (TORADOL) 60 MG/2ML SOLN injection Inject 2 mLs (60 mg total) into the muscle, at onset of migraine. May repeat in 6 hours. Use a max of 2 times daily and 4 days a month.   MIRENA, 52 MG, 20 MCG/24HR IUD    OnabotulinumtoxinA (BOTOX IM) Inject into the muscle. 155units IM to head and neck every 3 months for migraines.   ondansetron (ZOFRAN-ODT) 4 MG disintegrating tablet Dissolve 1-2 tablets (4-8 mg total) by mouth every 8 (eight) hours as needed for nausea or vomiting.   propranolol ER (INDERAL LA) 80 MG 24 hr capsule Take 1  capsule (80 mg total) by mouth daily.   topiramate (TOPAMAX) 100 MG tablet Take 1 tablet (100 mg total) by mouth 2 (two) times daily.   Ubrogepant (UBRELVY) 100 MG TABS Take 1 tablet by mouth daily as needed. Take one tablet at onset of headache, may repeat 1 tablet in 2 hours, no more than 2 tablets in 24 hours   zolpidem (AMBIEN) 10 MG tablet Take 1 tablet (10 mg total) by mouth at bedtime as needed for sleep.   Facility-Administered Encounter Medications as of 07/18/2022  Medication   cyanocobalamin ((VITAMIN B-12)) injection 1,000 mcg    Past Surgical History:  Procedure Laterality Date   CARPAL TUNNEL RELEASE Right 2021   carpel tunnel - left     CESAREAN SECTION     FOOT SURGERY Bilateral    PLANTAR FASCIA RELEASE  2021   ULNAR NERVE REPAIR     WISDOM TOOTH EXTRACTION      Family History  Problem Relation Age  of Onset   Thyroid disease Mother    Hyperlipidemia Mother    Arthritis/Rheumatoid Mother    Diabetes Mother    Stroke Mother    Hypertension Father    Kidney disease Father    Cirrhosis Father    Liver cancer Father    Diabetes Maternal Grandmother    Stroke Maternal Grandmother    Breast cancer Maternal Grandmother    Stroke Maternal Grandfather    Healthy Son       Controlled substance contract: 07/18/22     Review of Systems  Constitutional:  Negative for diaphoresis.  Eyes:  Negative for pain.  Respiratory:  Negative for shortness of breath.   Cardiovascular:  Negative for chest pain, palpitations and leg swelling.  Gastrointestinal:  Negative for abdominal pain.  Endocrine: Negative for polydipsia.  Skin:  Negative for rash.  Neurological:  Negative for dizziness, weakness and headaches.  Hematological:  Does not bruise/bleed easily.  All other systems reviewed and are negative.      Objective:   Physical Exam Vitals and nursing note reviewed.  Constitutional:      General: She is not in acute distress.    Appearance: Normal appearance. She is well-developed.  HENT:     Head: Normocephalic.     Right Ear: Tympanic membrane normal.     Left Ear: Tympanic membrane normal.     Nose: Nose normal.     Mouth/Throat:     Mouth: Mucous membranes are moist.  Eyes:     Pupils: Pupils are equal, round, and reactive to light.  Neck:     Vascular: No carotid bruit or JVD.  Cardiovascular:     Rate and Rhythm: Normal rate and regular rhythm.     Heart sounds: Normal heart sounds.  Pulmonary:     Effort: Pulmonary effort is normal. No respiratory distress.     Breath sounds: Normal breath sounds. No wheezing or rales.  Chest:     Chest wall: No tenderness.  Abdominal:     General: Bowel sounds are normal. There is no distension or abdominal bruit.     Palpations: Abdomen is soft. There is no hepatomegaly, splenomegaly, mass or pulsatile mass.     Tenderness:  There is no abdominal tenderness.  Musculoskeletal:        General: Normal range of motion.     Cervical back: Normal range of motion and neck supple.  Lymphadenopathy:     Cervical: No cervical adenopathy.  Skin:  General: Skin is warm and dry.  Neurological:     Mental Status: She is alert and oriented to person, place, and time.     Deep Tendon Reflexes: Reflexes are normal and symmetric.  Psychiatric:        Behavior: Behavior normal.        Thought Content: Thought content normal.        Judgment: Judgment normal.     BP 104/79   Pulse 83   Temp 97.7 F (36.5 C) (Temporal)   Resp 20   Ht 5\' 1"  (1.549 m)   Wt 117 lb (53.1 kg)   SpO2 94%   BMI 22.11 kg/m        Assessment & Plan:   in today with chief complaint of Medical Management of Chronic Issues   1. Chronic migraine without aura, with intractable migraine, so stated, with status migrainosus Continue followup with neurology  2. GAD (generalized anxiety disorder) Stress management - buPROPion (WELLBUTRIN XL) 300 MG 24 hr tablet; Take 1 tablet (300 mg total) by mouth daily.  Dispense: 90 tablet; Refill: 1 - ToxASSURE Select 13 (MW), Urine  3. Primary insomnia Bedtime routine - zolpidem (AMBIEN) 10 MG tablet; Take 1 tablet (10 mg total) by mouth at bedtime as needed for sleep.  Dispense: 30 tablet; Refill: 5    The above assessment and management plan was discussed with the patient. The patient verbalized understanding of and has agreed to the management plan. Patient is aware to call the clinic if symptoms persist or worsen. Patient is aware when to return to the clinic for a follow-up visit. Patient educated on when it is appropriate to go to the emergency department.   [name] [name], FNP

## 2022-07-18 NOTE — Telephone Encounter (Signed)
Pt called. Stated she uploaded her new insurance card to [employer].

## 2022-07-18 NOTE — Patient Instructions (Signed)
Insomnia Insomnia is a sleep disorder that makes it difficult to fall asleep or stay asleep. Insomnia can cause fatigue, low energy, difficulty concentrating, mood swings, and poor performance at work or school. There are three different ways to classify insomnia: Difficulty falling asleep. Difficulty staying asleep. Waking up too early in the morning. Any type of insomnia can be long-term (chronic) or short-term (acute). Both are common. Short-term insomnia usually lasts for 3 months or less. Chronic insomnia occurs at least three times a week for longer than 3 months. What are the causes? Insomnia may be caused by another condition, situation, or substance, such as: Having certain mental health conditions, such as anxiety and depression. Using caffeine, alcohol, tobacco, or drugs. Having gastrointestinal conditions, such as gastroesophageal reflux disease (GERD). Having certain medical conditions. These include: Asthma. Alzheimer's disease. Stroke. Chronic pain. An overactive thyroid gland (hyperthyroidism). Other sleep disorders, such as restless legs syndrome and sleep apnea. Menopause. Sometimes, the cause of insomnia may not be known. What increases the risk? Risk factors for insomnia include: Gender. Females are affected more often than males. Age. Insomnia is more common as people get older. Stress and certain medical and mental health conditions. Lack of exercise. Having an irregular work schedule. This may include working night shifts and traveling between different time zones. What are the signs or symptoms? If you have insomnia, the main symptom is having trouble falling asleep or having trouble staying asleep. This may lead to other symptoms, such as: Feeling tired or having low energy. Feeling nervous about going to sleep. Not feeling rested in the morning. Having trouble concentrating. Feeling irritable, anxious, or depressed. How is this diagnosed? This condition  may be diagnosed based on: Your symptoms and medical history. Your health care provider may ask about: Your sleep habits. Any medical conditions you have. Your mental health. A physical exam. How is this treated? Treatment for insomnia depends on the cause. Treatment may focus on treating an underlying condition that is causing the insomnia. Treatment may also include: Medicines to help you sleep. Counseling or therapy. Lifestyle adjustments to help you sleep better. Follow these instructions at home: Eating and drinking  Limit or avoid alcohol, caffeinated beverages, and products that contain nicotine and tobacco, especially close to bedtime. These can disrupt your sleep. Do not eat a large meal or eat spicy foods right before bedtime. This can lead to digestive discomfort that can make it hard for you to sleep. Sleep habits  Keep a sleep diary to help you and your health care provider figure out what could be causing your insomnia. Write down: When you sleep. When you wake up during the night. How well you sleep and how rested you feel the next day. Any side effects of medicines you are taking. What you eat and drink. Make your bedroom a dark, comfortable place where it is easy to fall asleep. Put up shades or blackout curtains to block light from outside. Use a white noise machine to block noise. Keep the temperature cool. Limit screen use before bedtime. This includes: Not watching TV. Not using your smartphone, tablet, or computer. Stick to a routine that includes going to bed and waking up at the same times every day and night. This can help you fall asleep faster. Consider making a quiet activity, such as reading, part of your nighttime routine. Try to avoid taking naps during the day so that you sleep better at night. Get out of bed if you are still awake after   15 minutes of trying to sleep. Keep the lights down, but try reading or doing a quiet activity. When you feel  sleepy, go back to bed. General instructions Take over-the-counter and prescription medicines only as told by your health care provider. Exercise regularly as told by your health care provider. However, avoid exercising in the hours right before bedtime. Use relaxation techniques to manage stress. Ask your health care provider to suggest some techniques that may work well for you. These may include: Breathing exercises. Routines to release muscle tension. Visualizing peaceful scenes. Make sure that you drive carefully. Do not drive if you feel very sleepy. Keep all follow-up visits. This is important. Contact a health care provider if: You are tired throughout the day. You have trouble in your daily routine due to sleepiness. You continue to have sleep problems, or your sleep problems get worse. Get help right away if: You have thoughts about hurting yourself or someone else. Get help right away if you feel like you may hurt yourself or others, or have thoughts about taking your own life. Go to your nearest emergency room or: Call 911. Call the National Suicide Prevention Lifeline at 1-800-273-8255 or 988. This is open 24 hours a day. Text the Crisis Text Line at 741741. Summary Insomnia is a sleep disorder that makes it difficult to fall asleep or stay asleep. Insomnia can be long-term (chronic) or short-term (acute). Treatment for insomnia depends on the cause. Treatment may focus on treating an underlying condition that is causing the insomnia. Keep a sleep diary to help you and your health care provider figure out what could be causing your insomnia. This information is not intended to replace advice given to you by your health care provider. Make sure you discuss any questions you have with your health care provider. Document Revised: 06/03/2021 Document Reviewed: 06/03/2021 Elsevier Patient Education  2023 Elsevier Inc.  

## 2022-07-19 ENCOUNTER — Other Ambulatory Visit (HOSPITAL_COMMUNITY): Payer: Self-pay

## 2022-07-19 ENCOUNTER — Telehealth: Payer: Commercial Managed Care - PPO | Admitting: Physician Assistant

## 2022-07-19 DIAGNOSIS — R3989 Other symptoms and signs involving the genitourinary system: Secondary | ICD-10-CM

## 2022-07-19 MED ORDER — CEPHALEXIN 500 MG PO CAPS: 500.0000 mg | ORAL_CAPSULE | Freq: Two times a day (BID) | ORAL | 0 refills | Status: AC

## 2022-07-19 NOTE — Progress Notes (Signed)

## 2022-07-19 NOTE — Progress Notes (Signed)
I have spent 5 minutes in review of e-visit questionnaire, review and updating patient chart, medical decision making and response to patient.   Sarath Privott Cody Keeleigh Terris, PA-C    

## 2022-07-21 ENCOUNTER — Other Ambulatory Visit: Payer: Self-pay

## 2022-07-23 LAB — TOXASSURE SELECT 13 (MW), URINE

## 2022-07-26 DIAGNOSIS — M47812 Spondylosis without myelopathy or radiculopathy, cervical region: Secondary | ICD-10-CM | POA: Diagnosis not present

## 2022-07-30 ENCOUNTER — Other Ambulatory Visit: Payer: Self-pay

## 2022-07-30 ENCOUNTER — Other Ambulatory Visit (HOSPITAL_COMMUNITY): Payer: Self-pay

## 2022-07-30 DIAGNOSIS — M79642 Pain in left hand: Secondary | ICD-10-CM | POA: Diagnosis not present

## 2022-07-30 DIAGNOSIS — R5383 Other fatigue: Secondary | ICD-10-CM | POA: Diagnosis not present

## 2022-07-30 DIAGNOSIS — M79641 Pain in right hand: Secondary | ICD-10-CM | POA: Diagnosis not present

## 2022-07-30 DIAGNOSIS — Z6822 Body mass index (BMI) 22.0-22.9, adult: Secondary | ICD-10-CM | POA: Diagnosis not present

## 2022-07-30 MED ORDER — CELECOXIB 200 MG PO CAPS
200.0000 mg | ORAL_CAPSULE | Freq: Two times a day (BID) | ORAL | 1 refills | Status: DC | PRN
  Filled 2022-07-30 (×2): qty 60, 30d supply, fill #0
  Filled 2022-09-04: qty 60, 30d supply, fill #1

## 2022-07-31 ENCOUNTER — Other Ambulatory Visit (HOSPITAL_COMMUNITY): Payer: Self-pay

## 2022-08-04 ENCOUNTER — Other Ambulatory Visit (HOSPITAL_COMMUNITY): Payer: Self-pay

## 2022-08-04 MED ORDER — GABAPENTIN 300 MG PO CAPS
600.0000 mg | ORAL_CAPSULE | Freq: Two times a day (BID) | ORAL | 1 refills | Status: DC
  Filled 2022-08-04: qty 240, 34d supply, fill #0

## 2022-08-04 MED ORDER — GABAPENTIN 300 MG PO CAPS
600.0000 mg | ORAL_CAPSULE | Freq: Four times a day (QID) | ORAL | 0 refills | Status: DC
  Filled 2022-08-04: qty 240, 30d supply, fill #0

## 2022-08-04 NOTE — Telephone Encounter (Signed)
Any updates on PA? Pt scheduled for injection on 08/13/22

## 2022-08-05 ENCOUNTER — Other Ambulatory Visit (HOSPITAL_COMMUNITY): Payer: Self-pay

## 2022-08-05 ENCOUNTER — Other Ambulatory Visit: Payer: Self-pay

## 2022-08-06 ENCOUNTER — Telehealth: Payer: Self-pay

## 2022-08-06 ENCOUNTER — Other Ambulatory Visit (HOSPITAL_COMMUNITY): Payer: Self-pay

## 2022-08-06 DIAGNOSIS — G43711 Chronic migraine without aura, intractable, with status migrainosus: Secondary | ICD-10-CM

## 2022-08-06 NOTE — Telephone Encounter (Signed)
Pharmacy Patient Advocate Encounter   Received notification from [place] that prior authorization for Botox 200UNIT solution is required/requested.   PA submitted on 08/06/2022 to (ins) [employer] via CoverMyMeds Key [id number] Status is pending  Requested an urgent PA

## 2022-08-07 DIAGNOSIS — G5602 Carpal tunnel syndrome, left upper limb: Secondary | ICD-10-CM | POA: Diagnosis not present

## 2022-08-07 DIAGNOSIS — M5412 Radiculopathy, cervical region: Secondary | ICD-10-CM | POA: Diagnosis not present

## 2022-08-07 DIAGNOSIS — G5603 Carpal tunnel syndrome, bilateral upper limbs: Secondary | ICD-10-CM | POA: Diagnosis not present

## 2022-08-07 DIAGNOSIS — G894 Chronic pain syndrome: Secondary | ICD-10-CM | POA: Diagnosis not present

## 2022-08-08 ENCOUNTER — Other Ambulatory Visit (HOSPITAL_COMMUNITY): Payer: Self-pay

## 2022-08-08 MED ORDER — HYDROCODONE-ACETAMINOPHEN 5-325 MG PO TABS
1.0000 | ORAL_TABLET | Freq: Three times a day (TID) | ORAL | 0 refills | Status: DC | PRN
  Filled 2022-08-08 – 2022-08-11 (×2): qty 45, 15d supply, fill #0

## 2022-08-08 NOTE — Telephone Encounter (Signed)
Hey Monica, any updates on this? 

## 2022-08-08 NOTE — Telephone Encounter (Signed)
FYI

## 2022-08-11 ENCOUNTER — Other Ambulatory Visit (HOSPITAL_COMMUNITY): Payer: Self-pay

## 2022-08-11 ENCOUNTER — Other Ambulatory Visit (HOSPITAL_BASED_OUTPATIENT_CLINIC_OR_DEPARTMENT_OTHER): Payer: Self-pay

## 2022-08-11 ENCOUNTER — Other Ambulatory Visit: Payer: Self-pay

## 2022-08-11 NOTE — Telephone Encounter (Signed)
Any updates on auth? Pt is scheduled for 08/13/22

## 2022-08-11 NOTE — Telephone Encounter (Signed)
Pharmacy Patient Advocate Encounter  Prior Authorization for Botox 200UNIT solution has been approved.    PA# PA Case ID: [id number] Effective dates: 08/08/2022 through 02/05/2023 Per test billing copay is $750.00 Can be filled with [facility]

## 2022-08-12 ENCOUNTER — Other Ambulatory Visit (HOSPITAL_COMMUNITY): Payer: Self-pay

## 2022-08-12 ENCOUNTER — Other Ambulatory Visit: Payer: Self-pay

## 2022-08-12 MED ORDER — ONABOTULINUMTOXINA 200 UNITS IJ SOLR
INTRAMUSCULAR | 3 refills | Status: DC
  Filled 2022-08-12: qty 1, fill #0
  Filled 2022-08-12: qty 1, 84d supply, fill #0
  Filled 2022-10-28: qty 1, 84d supply, fill #1
  Filled 2023-01-20: qty 1, 84d supply, fill #2
  Filled 2023-04-16: qty 1, 84d supply, fill #3

## 2022-08-12 NOTE — Addendum Note (Signed)
Addended by: Gildardo Griffes on: 08/12/2022 07:27 AM   Modules accepted: Orders

## 2022-08-12 NOTE — Telephone Encounter (Signed)
Please send script to [facility], thank you. 

## 2022-08-12 NOTE — Telephone Encounter (Signed)
Botox 200 unit Rx sent to [facility].  

## 2022-08-12 NOTE — Telephone Encounter (Signed)
BOTOXONE-Benefit Verification [id number] Submitted!

## 2022-08-13 ENCOUNTER — Ambulatory Visit: Payer: Commercial Managed Care - PPO | Admitting: Neurology

## 2022-08-13 ENCOUNTER — Other Ambulatory Visit (HOSPITAL_COMMUNITY): Payer: Self-pay

## 2022-08-13 ENCOUNTER — Encounter: Payer: Self-pay | Admitting: Neurology

## 2022-08-13 ENCOUNTER — Other Ambulatory Visit: Payer: Self-pay

## 2022-08-13 DIAGNOSIS — G43711 Chronic migraine without aura, intractable, with status migrainosus: Secondary | ICD-10-CM | POA: Diagnosis not present

## 2022-08-13 MED ORDER — ONABOTULINUMTOXINA 200 UNITS IJ SOLR
155.0000 [IU] | Freq: Once | INTRAMUSCULAR | Status: AC
  Administered 2022-08-13: 155 [IU] via INTRAMUSCULAR

## 2022-08-13 NOTE — Progress Notes (Signed)
08/13/2022: stable May 21, 2022: Patient's zavzpret was not approved.  Her Aimovig was approved. She is doing excellent great improvement.   02/24/2022: Still having 4 migraine days a month. However acute management doesn't appear to be working as well. Melburn Hake, nurtec, sumatriptan, rizatriptan, amerge(naratriptan). Will try the new Zavzpret for acute management as the oral meds still take hours and do not resolve the migraine acutely entirely. She feels the Aimovig has helped. 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 Also tried propranolol, topiramate, amitriptyline. At baseline she has chronic migraines, currently 4 migraine days a month and < 10 total headache days a month. Can also try Quilpta and if does well we can switch from [place] to [place]. In the meantime will try Zavzpret.  Meds ordered this encounter  Medications   botulinum toxin Type A (BOTOX) injection 155 Units    Botox- 200 units x 1 vial Lot: [id number] Expiration: 12/2024 NDC: 9892-1194-17  Bacteriostatic 0.9% Sodium Chloride- 48mL total Lot: [id number] Expiration: 05/2024 NDC: 18563-149-70  Dx: Y63.785 S/P      11/25/2021 ALL: Erica Rodriguez returns for Botox. We added Amovig injections at last follow up 11/14/2021. She has not picked up from pharmacy. She continues topiramate, propranolol and gabapentin. Having 8-12 headaches per month with 6-7 migrainous headaches. She alternates Ubrelvy, Zofran and Toradol as needed for abortive therapy. Nurtec was not beneficial.  08/29/2021 ALL: Erica Rodriguez returns for Botox. She continues to do well on propranolol, topiramate and Ubrelvy. She reports headaches were fairly well managed until the week prior to Botox was due. She has had to take more Toradol and Ubrelvy over the past week. She is uncertain of triggers but endorses more stress and potential relationship to weather changes. She will continue to monitor.   06/03/2021 ALL: Erica Rodriguez returns for Botox. She is doing well. She continues  propranolol, topiramate and Ubrelvy. She doe snot usually have migraines until Botox wears off. She has been having more trouble with TMJ. Masseter injections have been very helpful but she is aware that we will only do Botox protocol today due to insurance requirements.   02/19/2021 ALL: She continues Botox, propranolol 80mg  daily, topiramate 100mg  BID and Ubrelvy. She rarely needs Toradol. She does use cyclobenzaprine for muscle tension leading to headaches. She feels that she is doing really well. May have 1-2 migraines per month, on average. She reports not taking propranolol while on vacation this summer and felt terribly. She feels that LS injection helps with right muscle tension. She has had masseter injections in the past that help with clenching.   11/19/2020 ALL: She feels Botox works very well for about 2.5 months then headaches return. We increased propranolol in 08/2020 to 80mg  ER daily. She reports this has helped with headaches. She continues topiramate 100mg  BID and Ubrelvy 100mg  PRN. She continues to have increased anxiety and feels stress contributes to headaches. Vibryyd worsened headaches. She was recently started on Wellbutrin. Toradol used sparingly for intractable headaches. She has had more tension in right shoulder/upper back.   08/09/2020 ALL: She is doing well, today. She does feel headaches have been a little more frequent over the past two weeks but feels that recent illness and being off schedule with Botox has played a role. She continues to have about 10 migraines every month. She is considering increasing propranolol. Will wait to see what happens with this procedure. She continues topiramate and Nurtec/Ubrelvy.   04/23/2020 ALL: She is doing well. Botox is helping. She continues topiramate 100mg  BID  and propranolol 60mg  daily. She alternates Nurtec and Ubrelvy. She usually uses Nurtec then will take Roselyn Meier if headache continues. She has about 10-12 migraines per month.  Stress  is contributor.    Consent Form Botulism Toxin Injection For Chronic Migraine    Reviewed orally with patient, additionally signature is on file:  Botulism toxin has been approved by the Federal drug administration for treatment of chronic migraine. Botulism toxin does not cure chronic migraine and it may not be effective in some patients.  The administration of botulism toxin is accomplished by injecting a small amount of toxin into the muscles of the neck and head. Dosage must be titrated for each individual. Any benefits resulting from botulism toxin tend to wear off after 3 months with a repeat injection required if benefit is to be maintained. Injections are usually done every 3-4 months with maximum effect peak achieved by about 2 or 3 weeks. Botulism toxin is expensive and you should be sure of what costs you will incur resulting from the injection.  The side effects of botulism toxin use for chronic migraine may include:   -Transient, and usually mild, facial weakness with facial injections  -Transient, and usually mild, head or neck weakness with head/neck injections  -Reduction or loss of forehead facial animation due to forehead muscle weakness  -Eyelid drooping  -Dry eye  -Pain at the site of injection or bruising at the site of injection  -Double vision  -Potential unknown long term risks   Contraindications: You should not have Botox if you are pregnant, nursing, allergic to albumin, have an infection, skin condition, or muscle weakness at the site of the injection, or have myasthenia gravis, Lambert-Eaton syndrome, or ALS.  It is also possible that as with any injection, there may be an allergic reaction or no effect from the medication. Reduced effectiveness after repeated injections is sometimes seen and rarely infection at the injection site may occur. All care will be taken to prevent these side effects. If therapy is given over a long time, atrophy and wasting in the  muscle injected may occur. Occasionally the patient's become refractory to treatment because they develop antibodies to the toxin. In this event, therapy needs to be modified.  I have read the above information and consent to the administration of botulism toxin.    BOTOX PROCEDURE NOTE FOR MIGRAINE HEADACHE  Contraindications and precautions discussed with patient(above). Aseptic procedure was observed and patient tolerated procedure. Procedure performed by Debbora Presto, FNP-C.   The condition has existed for more than 6 months, and pt does not have a diagnosis of ALS, Myasthenia Gravis or Lambert-Eaton Syndrome.  Risks and benefits of injections discussed and pt agrees to proceed with the procedure.  Written consent obtained  These injections are medically necessary. Pt  receives good benefits from these injections. These injections do not cause sedations or hallucinations which the oral therapies may cause.   Description of procedure:  The patient was placed in a sitting position. The standard protocol was used for Botox as follows, with 5 units of Botox injected at each site:  -Procerus muscle, midline injection  -Corrugator muscle, bilateral injection  -Frontalis muscle, bilateral injection, with 2 sites each side, medial injection was performed in the upper one third of the frontalis muscle, in the region vertical from the medial inferior edge of the superior orbital rim. The lateral injection was again in the upper one third of the forehead vertically above the lateral limbus of the cornea,  1.5 cm lateral to the medial injection site.  -Temporalis muscle injection, 4 sites, bilaterally. The first injection was 3 cm above the tragus of the ear, second injection site was 1.5 cm to 3 cm up from the first injection site in line with the tragus of the ear. The third injection site was 1.5-3 cm forward between the first 2 injection sites. The fourth injection site was 1.5 cm posterior to the  second injection site. 5th site laterally in the temporalis  muscleat the level of the outer canthus.  -Occipitalis muscle injection, 3 sites, bilaterally. The first injection was done one half way between the occipital protuberance and the tip of the mastoid process behind the ear. The second injection site was done lateral and superior to the first, 1 fingerbreadth from the first injection. The third injection site was 1 fingerbreadth superiorly and medially from the first injection site.  -Cervical paraspinal muscle injection, 2 sites, bilaterally. The first injection site was 1 cm from the midline of the cervical spine, 3 cm inferior to the lower border of the occipital protuberance. The second injection site was 1.5 cm superiorly and laterally to the first injection site.  -Trapezius muscle injection was performed at 3 sites, bilaterally. The first injection site was in the upper trapezius muscle halfway between the inflection point of the neck, and the acromion. The second injection site was one half way between the acromion and the first injection site. The third injection was done between the first injection site and the inflection point of the neck.   Will return for repeat injection in 3 months.   A total of 200 units of Botox was prepared, 155 units of Botox was injected as documented above, 45 units of Botox was wasted. The patient tolerated the procedure well, there were no complications of the above procedure.

## 2022-08-13 NOTE — Progress Notes (Signed)
Botox- 200 units x 1 vial Lot: [id number] Expiration: 12/2024 NDC: 9735-3299-24  Bacteriostatic 0.9% Sodium Chloride- 54mL total Lot: [id number] Expiration: 05/2024 NDC: 62229-798-92  Dx: J19.417 S/P

## 2022-08-14 ENCOUNTER — Telehealth: Payer: Self-pay | Admitting: Pharmacy Technician

## 2022-08-14 NOTE — Telephone Encounter (Signed)
Patient Advocate Encounter  Prior Authorization for Erica Rodriguez 100MG  tablets has been approved.    PA# [id number] Effective dates: 08/14/2022 through 08/14/2023      Erica Rodriguez, [place] Patient Advocate Specialist [place] Patient Advocate Team Direct Number: [phone]  Fax: [phone]

## 2022-08-14 NOTE — Telephone Encounter (Signed)
Patient Advocate Encounter   Received notification that prior authorization for Ubrelvy 100MG  tablets is required.   PA submitted on 08/14/2022 Key [id number] Status is pending       Lyndel Safe, CPhT Pharmacy Patient Advocate Specialist [place] Patient Advocate Team Direct Number: [phone]  Fax: [phone]

## 2022-08-15 ENCOUNTER — Other Ambulatory Visit (HOSPITAL_COMMUNITY): Payer: Self-pay

## 2022-08-15 ENCOUNTER — Other Ambulatory Visit: Payer: Self-pay

## 2022-08-15 ENCOUNTER — Encounter: Payer: Self-pay | Admitting: Neurology

## 2022-08-15 DIAGNOSIS — G43709 Chronic migraine without aura, not intractable, without status migrainosus: Secondary | ICD-10-CM

## 2022-08-15 DIAGNOSIS — M62838 Other muscle spasm: Secondary | ICD-10-CM

## 2022-08-18 ENCOUNTER — Other Ambulatory Visit (HOSPITAL_COMMUNITY): Payer: Self-pay

## 2022-08-18 ENCOUNTER — Other Ambulatory Visit: Payer: Self-pay

## 2022-08-18 MED ORDER — CYCLOBENZAPRINE HCL 5 MG PO TABS
5.0000 mg | ORAL_TABLET | Freq: Three times a day (TID) | ORAL | 2 refills | Status: DC | PRN
  Filled 2022-08-18: qty 60, 20d supply, fill #0
  Filled 2022-12-30: qty 60, 20d supply, fill #1
  Filled 2023-03-03: qty 60, 20d supply, fill #2

## 2022-08-18 MED ORDER — TOPIRAMATE 100 MG PO TABS
100.0000 mg | ORAL_TABLET | Freq: Two times a day (BID) | ORAL | 3 refills | Status: DC
  Filled 2022-08-18: qty 180, 90d supply, fill #0
  Filled 2022-11-13: qty 180, 90d supply, fill #1
  Filled 2023-02-09: qty 180, 90d supply, fill #2
  Filled 2023-05-11: qty 180, 90d supply, fill #3

## 2022-09-01 ENCOUNTER — Other Ambulatory Visit: Payer: Self-pay

## 2022-09-01 ENCOUNTER — Other Ambulatory Visit (HOSPITAL_COMMUNITY): Payer: Self-pay

## 2022-09-01 MED ORDER — GABAPENTIN 300 MG PO CAPS
600.0000 mg | ORAL_CAPSULE | Freq: Four times a day (QID) | ORAL | 2 refills | Status: DC
  Filled 2022-09-01: qty 240, 30d supply, fill #0
  Filled 2022-09-29: qty 240, 30d supply, fill #1
  Filled 2022-10-30: qty 240, 30d supply, fill #2

## 2022-09-04 ENCOUNTER — Other Ambulatory Visit: Payer: Self-pay | Admitting: Nurse Practitioner

## 2022-09-04 DIAGNOSIS — E538 Deficiency of other specified B group vitamins: Secondary | ICD-10-CM

## 2022-09-05 ENCOUNTER — Other Ambulatory Visit (HOSPITAL_COMMUNITY): Payer: Self-pay

## 2022-09-05 ENCOUNTER — Other Ambulatory Visit: Payer: Self-pay

## 2022-09-05 MED ORDER — CYANOCOBALAMIN 1000 MCG/ML IJ SOLN
1000.0000 ug | INTRAMUSCULAR | 12 refills | Status: DC
  Filled 2022-09-05: qty 3, 90d supply, fill #0
  Filled 2022-12-09: qty 3, 90d supply, fill #1
  Filled 2023-03-09: qty 3, 90d supply, fill #2
  Filled 2023-05-28: qty 3, 90d supply, fill #3
  Filled 2023-09-04: qty 3, 90d supply, fill #4

## 2022-09-06 ENCOUNTER — Other Ambulatory Visit (HOSPITAL_COMMUNITY): Payer: Self-pay

## 2022-09-08 ENCOUNTER — Other Ambulatory Visit: Payer: Self-pay

## 2022-09-09 ENCOUNTER — Other Ambulatory Visit: Payer: Self-pay

## 2022-09-09 ENCOUNTER — Encounter: Payer: Self-pay | Admitting: Neurology

## 2022-09-09 ENCOUNTER — Other Ambulatory Visit (HOSPITAL_COMMUNITY): Payer: Self-pay

## 2022-09-09 DIAGNOSIS — M25551 Pain in right hip: Secondary | ICD-10-CM | POA: Diagnosis not present

## 2022-09-09 DIAGNOSIS — M545 Low back pain, unspecified: Secondary | ICD-10-CM | POA: Diagnosis not present

## 2022-09-11 ENCOUNTER — Other Ambulatory Visit (HOSPITAL_COMMUNITY): Payer: Self-pay

## 2022-09-11 ENCOUNTER — Other Ambulatory Visit: Payer: Self-pay

## 2022-09-11 DIAGNOSIS — M5412 Radiculopathy, cervical region: Secondary | ICD-10-CM | POA: Diagnosis not present

## 2022-09-11 DIAGNOSIS — M5416 Radiculopathy, lumbar region: Secondary | ICD-10-CM | POA: Diagnosis not present

## 2022-09-11 MED ORDER — HYDROCODONE-ACETAMINOPHEN 5-325 MG PO TABS
1.0000 | ORAL_TABLET | Freq: Three times a day (TID) | ORAL | 0 refills | Status: DC | PRN
  Filled 2022-09-11: qty 45, 15d supply, fill #0

## 2022-09-12 ENCOUNTER — Ambulatory Visit: Payer: Commercial Managed Care - PPO | Admitting: Nurse Practitioner

## 2022-09-13 ENCOUNTER — Other Ambulatory Visit (HOSPITAL_COMMUNITY): Payer: Self-pay

## 2022-09-15 ENCOUNTER — Other Ambulatory Visit: Payer: Self-pay

## 2022-09-15 ENCOUNTER — Other Ambulatory Visit: Payer: Self-pay | Admitting: Neurology

## 2022-09-15 ENCOUNTER — Ambulatory Visit (INDEPENDENT_AMBULATORY_CARE_PROVIDER_SITE_OTHER): Payer: Self-pay | Admitting: Neurology

## 2022-09-15 ENCOUNTER — Encounter: Payer: Self-pay | Admitting: Neurology

## 2022-09-15 ENCOUNTER — Other Ambulatory Visit (HOSPITAL_COMMUNITY): Payer: Self-pay

## 2022-09-15 VITALS — BP 116/78 | HR 85 | Ht 61.0 in | Wt 114.4 lb

## 2022-09-15 DIAGNOSIS — H02401 Unspecified ptosis of right eyelid: Secondary | ICD-10-CM

## 2022-09-15 MED ORDER — UPNEEQ 0.1 % OP SOLN: 1.0000 [drp] | Freq: Every day | OPHTHALMIC | 0 refills | Status: DC

## 2022-09-15 NOTE — Progress Notes (Signed)
NO CHARGE VISIT HAD A PTOSIS DUE TO BOTOX discussed will subside mild  08/13/2022: stable May 21, 2022: Patient's zavzpret was not approved.  Her Aimovig was approved. She is doing excellent great improvement.   02/24/2022: Still having 4 migraine days a month. However acute management doesn't appear to be working as well. Melburn Hake, nurtec, sumatriptan, rizatriptan, amerge(naratriptan). Will try the new Zavzpret for acute management as the oral meds still take hours and do not resolve the migraine acutely entirely. She feels the Aimovig has helped. 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 Also tried propranolol, topiramate, amitriptyline. At baseline she has chronic migraines, currently 4 migraine days a month and < 10 total headache days a month. Can also try Quilpta and if does well we can switch from [place] to [place]. In the meantime will try Zavzpret.  No orders of the defined types were placed in this encounter.     11/25/2021 ALL: Latangela returns for Botox. We added Amovig injections at last follow up 11/14/2021. She has not picked up from pharmacy. She continues topiramate, propranolol and gabapentin. Having 8-12 headaches per month with 6-7 migrainous headaches. She alternates Ubrelvy, Zofran and Toradol as needed for abortive therapy. Nurtec was not beneficial.  08/29/2021 ALL: Laquista returns for Botox. She continues to do well on propranolol, topiramate and Ubrelvy. She reports headaches were fairly well managed until the week prior to Botox was due. She has had to take more Toradol and Ubrelvy over the past week. She is uncertain of triggers but endorses more stress and potential relationship to weather changes. She will continue to monitor.   06/03/2021 ALL: Dorothy returns for Botox. She is doing well. She continues propranolol, topiramate and Ubrelvy. She doe snot usually have migraines until Botox wears off. She has been having more trouble with TMJ. Masseter injections have been  very helpful but she is aware that we will only do Botox protocol today due to insurance requirements.   02/19/2021 ALL: She continues Botox, propranolol 80mg  daily, topiramate 100mg  BID and Ubrelvy. She rarely needs Toradol. She does use cyclobenzaprine for muscle tension leading to headaches. She feels that she is doing really well. May have 1-2 migraines per month, on average. She reports not taking propranolol while on vacation this summer and felt terribly. She feels that LS injection helps with right muscle tension. She has had masseter injections in the past that help with clenching.   11/19/2020 ALL: She feels Botox works very well for about 2.5 months then headaches return. We increased propranolol in 08/2020 to 80mg  ER daily. She reports this has helped with headaches. She continues topiramate 100mg  BID and Ubrelvy 100mg  PRN. She continues to have increased anxiety and feels stress contributes to headaches. Vibryyd worsened headaches. She was recently started on Wellbutrin. Toradol used sparingly for intractable headaches. She has had more tension in right shoulder/upper back.   08/09/2020 ALL: She is doing well, today. She does feel headaches have been a little more frequent over the past two weeks but feels that recent illness and being off schedule with Botox has played a role. She continues to have about 10 migraines every month. She is considering increasing propranolol. Will wait to see what happens with this procedure. She continues topiramate and Nurtec/Ubrelvy.   04/23/2020 ALL: She is doing well. Botox is helping. She continues topiramate 100mg  BID and propranolol 60mg  daily. She alternates Nurtec and Ubrelvy. She usually uses Nurtec then will take Roselyn Meier if headache continues. She has about  10-12 migraines per month.  Stress is contributor.    Consent Form Botulism Toxin Injection For Chronic Migraine    Reviewed orally with patient, additionally signature is on file:  Botulism  toxin has been approved by the Federal drug administration for treatment of chronic migraine. Botulism toxin does not cure chronic migraine and it may not be effective in some patients.  The administration of botulism toxin is accomplished by injecting a small amount of toxin into the muscles of the neck and head. Dosage must be titrated for each individual. Any benefits resulting from botulism toxin tend to wear off after 3 months with a repeat injection required if benefit is to be maintained. Injections are usually done every 3-4 months with maximum effect peak achieved by about 2 or 3 weeks. Botulism toxin is expensive and you should be sure of what costs you will incur resulting from the injection.  The side effects of botulism toxin use for chronic migraine may include:   -Transient, and usually mild, facial weakness with facial injections  -Transient, and usually mild, head or neck weakness with head/neck injections  -Reduction or loss of forehead facial animation due to forehead muscle weakness  -Eyelid drooping  -Dry eye  -Pain at the site of injection or bruising at the site of injection  -Double vision  -Potential unknown long term risks   Contraindications: You should not have Botox if you are pregnant, nursing, allergic to albumin, have an infection, skin condition, or muscle weakness at the site of the injection, or have myasthenia gravis, Lambert-Eaton syndrome, or ALS.  It is also possible that as with any injection, there may be an allergic reaction or no effect from the medication. Reduced effectiveness after repeated injections is sometimes seen and rarely infection at the injection site may occur. All care will be taken to prevent these side effects. If therapy is given over a long time, atrophy and wasting in the muscle injected may occur. Occasionally the patient's become refractory to treatment because they develop antibodies to the toxin. In this event, therapy needs to be  modified.  I have read the above information and consent to the administration of botulism toxin.    BOTOX PROCEDURE NOTE FOR MIGRAINE HEADACHE  Contraindications and precautions discussed with patient(above). Aseptic procedure was observed and patient tolerated procedure. Procedure performed by Debbora Presto, FNP-C.   The condition has existed for more than 6 months, and pt does not have a diagnosis of ALS, Myasthenia Gravis or Lambert-Eaton Syndrome.  Risks and benefits of injections discussed and pt agrees to proceed with the procedure.  Written consent obtained  These injections are medically necessary. Pt  receives good benefits from these injections. These injections do not cause sedations or hallucinations which the oral therapies may cause.   Description of procedure:  The patient was placed in a sitting position. The standard protocol was used for Botox as follows, with 5 units of Botox injected at each site:  -Procerus muscle, midline injection  -Corrugator muscle, bilateral injection  -Frontalis muscle, bilateral injection, with 2 sites each side, medial injection was performed in the upper one third of the frontalis muscle, in the region vertical from the medial inferior edge of the superior orbital rim. The lateral injection was again in the upper one third of the forehead vertically above the lateral limbus of the cornea, 1.5 cm lateral to the medial injection site.  -Temporalis muscle injection, 4 sites, bilaterally. The first injection was 3 cm above the  tragus of the ear, second injection site was 1.5 cm to 3 cm up from the first injection site in line with the tragus of the ear. The third injection site was 1.5-3 cm forward between the first 2 injection sites. The fourth injection site was 1.5 cm posterior to the second injection site. 5th site laterally in the temporalis  muscleat the level of the outer canthus.  -Occipitalis muscle injection, 3 sites, bilaterally. The first  injection was done one half way between the occipital protuberance and the tip of the mastoid process behind the ear. The second injection site was done lateral and superior to the first, 1 fingerbreadth from the first injection. The third injection site was 1 fingerbreadth superiorly and medially from the first injection site.  -Cervical paraspinal muscle injection, 2 sites, bilaterally. The first injection site was 1 cm from the midline of the cervical spine, 3 cm inferior to the lower border of the occipital protuberance. The second injection site was 1.5 cm superiorly and laterally to the first injection site.  -Trapezius muscle injection was performed at 3 sites, bilaterally. The first injection site was in the upper trapezius muscle halfway between the inflection point of the neck, and the acromion. The second injection site was one half way between the acromion and the first injection site. The third injection was done between the first injection site and the inflection point of the neck.   Will return for repeat injection in 3 months.   A total of 200 units of Botox was prepared, 155 units of Botox was injected as documented above, 45 units of Botox was wasted. The patient tolerated the procedure well, there were no complications of the above procedure.

## 2022-09-16 ENCOUNTER — Other Ambulatory Visit: Payer: Self-pay

## 2022-09-21 DIAGNOSIS — M545 Low back pain, unspecified: Secondary | ICD-10-CM | POA: Diagnosis not present

## 2022-09-25 ENCOUNTER — Encounter: Payer: Self-pay | Admitting: Neurology

## 2022-09-30 ENCOUNTER — Other Ambulatory Visit: Payer: Self-pay

## 2022-09-30 DIAGNOSIS — M65311 Trigger thumb, right thumb: Secondary | ICD-10-CM | POA: Diagnosis not present

## 2022-09-30 DIAGNOSIS — M65312 Trigger thumb, left thumb: Secondary | ICD-10-CM | POA: Diagnosis not present

## 2022-10-01 DIAGNOSIS — M5451 Vertebrogenic low back pain: Secondary | ICD-10-CM | POA: Diagnosis not present

## 2022-10-01 DIAGNOSIS — M542 Cervicalgia: Secondary | ICD-10-CM | POA: Diagnosis not present

## 2022-10-14 DIAGNOSIS — M5451 Vertebrogenic low back pain: Secondary | ICD-10-CM | POA: Diagnosis not present

## 2022-10-14 DIAGNOSIS — M542 Cervicalgia: Secondary | ICD-10-CM | POA: Diagnosis not present

## 2022-10-15 ENCOUNTER — Other Ambulatory Visit (HOSPITAL_COMMUNITY): Payer: Self-pay

## 2022-10-15 ENCOUNTER — Other Ambulatory Visit: Payer: Self-pay

## 2022-10-20 ENCOUNTER — Other Ambulatory Visit: Payer: Self-pay

## 2022-10-20 DIAGNOSIS — M5412 Radiculopathy, cervical region: Secondary | ICD-10-CM | POA: Diagnosis not present

## 2022-10-20 DIAGNOSIS — M47812 Spondylosis without myelopathy or radiculopathy, cervical region: Secondary | ICD-10-CM | POA: Diagnosis not present

## 2022-10-21 ENCOUNTER — Other Ambulatory Visit: Payer: Self-pay

## 2022-10-28 ENCOUNTER — Other Ambulatory Visit (HOSPITAL_COMMUNITY): Payer: Self-pay

## 2022-10-30 ENCOUNTER — Encounter: Payer: Self-pay | Admitting: Neurology

## 2022-10-31 ENCOUNTER — Other Ambulatory Visit: Payer: Self-pay

## 2022-10-31 ENCOUNTER — Other Ambulatory Visit (HOSPITAL_COMMUNITY): Payer: Self-pay

## 2022-11-05 ENCOUNTER — Ambulatory Visit (INDEPENDENT_AMBULATORY_CARE_PROVIDER_SITE_OTHER): Payer: Commercial Managed Care - PPO | Admitting: Neurology

## 2022-11-05 ENCOUNTER — Other Ambulatory Visit: Payer: Self-pay

## 2022-11-05 ENCOUNTER — Encounter: Payer: Self-pay | Admitting: Neurology

## 2022-11-05 DIAGNOSIS — G43709 Chronic migraine without aura, not intractable, without status migrainosus: Secondary | ICD-10-CM | POA: Diagnosis not present

## 2022-11-05 MED ORDER — ONABOTULINUMTOXINA 200 UNITS IJ SOLR
155.0000 [IU] | Freq: Once | INTRAMUSCULAR | Status: AC
  Administered 2022-11-05: 155 [IU] via INTRAMUSCULAR

## 2022-11-05 MED ORDER — METHYLPREDNISOLONE 4 MG PO TBPK
ORAL_TABLET | ORAL | 1 refills | Status: DC
  Filled 2022-11-05: qty 21, 6d supply, fill #0

## 2022-11-05 NOTE — Progress Notes (Signed)
11/05/2022: stable 08/13/2022: stable May 21, 2022: Patient's zavzpret was not approved.  Her Aimovig was approved. She is doing excellent great improvement.   02/24/2022: Still having 4 migraine days a month. However acute management doesn't appear to be working as well. Dorris Singh, nurtec, sumatriptan, rizatriptan, amerge(naratriptan). Will try the new Zavzpret for acute management as the oral meds still take hours and do not resolve the migraine acutely entirely. She feels the Aimovig has helped. She has tried Buyer, retail. Also tried propranolol, topiramate, amitriptyline. At baseline she has chronic migraines, currently 4 migraine days a month and < 10 total headache days a month. Can also try Quilpta and if does well we can switch from aimovig to [place]. In the meantime will try Zavzpret.  No orders of the defined types were placed in this encounter.     11/25/2021 ALL: Lexxie returns for Botox. We added Amovig injections at last follow up 11/14/2021. She has not picked up from pharmacy. She continues topiramate, propranolol and gabapentin. Having 8-12 headaches per month with 6-7 migrainous headaches. She alternates Ubrelvy, Zofran and Toradol as needed for abortive therapy. Nurtec was not beneficial.  08/29/2021 ALL: Suleika returns for Botox. She continues to do well on propranolol, topiramate and Ubrelvy. She reports headaches were fairly well managed until the week prior to Botox was due. She has had to take more Toradol and Ubrelvy over the past week. She is uncertain of triggers but endorses more stress and potential relationship to weather changes. She will continue to monitor.   06/03/2021 ALL: Zhoe returns for Botox. She is doing well. She continues propranolol, topiramate and Ubrelvy. She doe snot usually have migraines until Botox wears off. She has been having more trouble with TMJ. Masseter injections have been very helpful but she is aware that we will only do Botox  protocol today due to insurance requirements.   02/19/2021 ALL: She continues Botox, propranolol 80mg  daily, topiramate 100mg  BID and Ubrelvy. She rarely needs Toradol. She does use cyclobenzaprine for muscle tension leading to headaches. She feels that she is doing really well. May have 1-2 migraines per month, on average. She reports not taking propranolol while on vacation this summer and felt terribly. She feels that LS injection helps with right muscle tension. She has had masseter injections in the past that help with clenching.   11/19/2020 ALL: She feels Botox works very well for about 2.5 months then headaches return. We increased propranolol in 08/2020 to 80mg  ER daily. She reports this has helped with headaches. She continues topiramate 100mg  BID and Ubrelvy 100mg  PRN. She continues to have increased anxiety and feels stress contributes to headaches. Vibryyd worsened headaches. She was recently started on Wellbutrin. Toradol used sparingly for intractable headaches. She has had more tension in right shoulder/upper back.   08/09/2020 ALL: She is doing well, today. She does feel headaches have been a little more frequent over the past two weeks but feels that recent illness and being off schedule with Botox has played a role. She continues to have about 10 migraines every month. She is considering increasing propranolol. Will wait to see what happens with this procedure. She continues topiramate and Nurtec/Ubrelvy.   04/23/2020 ALL: She is doing well. Botox is helping. She continues topiramate 100mg  BID and propranolol 60mg  daily. She alternates Nurtec and Ubrelvy. She usually uses Nurtec then will take Bernita Raisin if headache continues. She has about 10-12 migraines per month.  Stress is contributor.    Consent Form  Botulism Toxin Injection For Chronic Migraine    Reviewed orally with patient, additionally signature is on file:  Botulism toxin has been approved by the Federal drug administration  for treatment of chronic migraine. Botulism toxin does not cure chronic migraine and it may not be effective in some patients.  The administration of botulism toxin is accomplished by injecting a small amount of toxin into the muscles of the neck and head. Dosage must be titrated for each individual. Any benefits resulting from botulism toxin tend to wear off after 3 months with a repeat injection required if benefit is to be maintained. Injections are usually done every 3-4 months with maximum effect peak achieved by about 2 or 3 weeks. Botulism toxin is expensive and you should be sure of what costs you will incur resulting from the injection.  The side effects of botulism toxin use for chronic migraine may include:   -Transient, and usually mild, facial weakness with facial injections  -Transient, and usually mild, head or neck weakness with head/neck injections  -Reduction or loss of forehead facial animation due to forehead muscle weakness  -Eyelid drooping  -Dry eye  -Pain at the site of injection or bruising at the site of injection  -Double vision  -Potential unknown long term risks   Contraindications: You should not have Botox if you are pregnant, nursing, allergic to albumin, have an infection, skin condition, or muscle weakness at the site of the injection, or have myasthenia gravis, Lambert-Eaton syndrome, or ALS.  It is also possible that as with any injection, there may be an allergic reaction or no effect from the medication. Reduced effectiveness after repeated injections is sometimes seen and rarely infection at the injection site may occur. All care will be taken to prevent these side effects. If therapy is given over a long time, atrophy and wasting in the muscle injected may occur. Occasionally the patient's become refractory to treatment because they develop antibodies to the toxin. In this event, therapy needs to be modified.  I have read the above information and consent  to the administration of botulism toxin.    BOTOX PROCEDURE NOTE FOR MIGRAINE HEADACHE  Contraindications and precautions discussed with patient(above). Aseptic procedure was observed and patient tolerated procedure. Procedure performed by Shawnie Dapper, FNP-C.   The condition has existed for more than 6 months, and pt does not have a diagnosis of ALS, Myasthenia Gravis or Lambert-Eaton Syndrome.  Risks and benefits of injections discussed and pt agrees to proceed with the procedure.  Written consent obtained  These injections are medically necessary. Pt  receives good benefits from these injections. These injections do not cause sedations or hallucinations which the oral therapies may cause.   Description of procedure:  The patient was placed in a sitting position. The standard protocol was used for Botox as follows, with 5 units of Botox injected at each site:  -Procerus muscle, midline injection  -Corrugator muscle, bilateral injection  -Frontalis muscle, bilateral injection, with 2 sites each side, medial injection was performed in the upper one third of the frontalis muscle, in the region vertical from the medial inferior edge of the superior orbital rim. The lateral injection was again in the upper one third of the forehead vertically above the lateral limbus of the cornea, 1.5 cm lateral to the medial injection site.  -Temporalis muscle injection, 4 sites, bilaterally. The first injection was 3 cm above the tragus of the ear, second injection site was 1.5 cm to 3 cm  up from the first injection site in line with the tragus of the ear. The third injection site was 1.5-3 cm forward between the first 2 injection sites. The fourth injection site was 1.5 cm posterior to the second injection site. 5th site laterally in the temporalis  muscleat the level of the outer canthus.  -Occipitalis muscle injection, 3 sites, bilaterally. The first injection was done one half way between the occipital  protuberance and the tip of the mastoid process behind the ear. The second injection site was done lateral and superior to the first, 1 fingerbreadth from the first injection. The third injection site was 1 fingerbreadth superiorly and medially from the first injection site.  -Cervical paraspinal muscle injection, 2 sites, bilaterally. The first injection site was 1 cm from the midline of the cervical spine, 3 cm inferior to the lower border of the occipital protuberance. The second injection site was 1.5 cm superiorly and laterally to the first injection site.  -Trapezius muscle injection was performed at 3 sites, bilaterally. The first injection site was in the upper trapezius muscle halfway between the inflection point of the neck, and the acromion. The second injection site was one half way between the acromion and the first injection site. The third injection was done between the first injection site and the inflection point of the neck.   Will return for repeat injection in 3 months.   A total of 200 units of Botox was prepared, 155 units of Botox was injected as documented above, 45 units of Botox was wasted. The patient tolerated the procedure well, there were no complications of the above procedure.

## 2022-11-05 NOTE — Telephone Encounter (Signed)
I was able to get her scheduled with Megan for 02/03/23 at 8am, thank you!

## 2022-11-05 NOTE — Progress Notes (Signed)
Botox- 200 units x 1 vial Lot: [id number] Expiration: 12/2024 NDC: 5409-8119-14  Bacteriostatic 0.9% Sodium Chloride- * mL  Lot: [id number] Expiration: 05/2024 NDC: 13086-578-46  Dx:G43.711 S/P  Witnessed by Delmer Islam

## 2022-11-06 DIAGNOSIS — M5416 Radiculopathy, lumbar region: Secondary | ICD-10-CM | POA: Diagnosis not present

## 2022-11-14 ENCOUNTER — Other Ambulatory Visit (HOSPITAL_COMMUNITY): Payer: Self-pay

## 2022-11-14 ENCOUNTER — Other Ambulatory Visit: Payer: Self-pay

## 2022-11-19 ENCOUNTER — Ambulatory Visit: Payer: 59 | Admitting: Neurology

## 2022-11-20 ENCOUNTER — Ambulatory Visit: Payer: 59 | Admitting: Family Medicine

## 2022-11-22 ENCOUNTER — Telehealth: Payer: Commercial Managed Care - PPO | Admitting: Nurse Practitioner

## 2022-11-22 DIAGNOSIS — J019 Acute sinusitis, unspecified: Secondary | ICD-10-CM | POA: Diagnosis not present

## 2022-11-22 DIAGNOSIS — B9689 Other specified bacterial agents as the cause of diseases classified elsewhere: Secondary | ICD-10-CM

## 2022-11-22 MED ORDER — AMOXICILLIN-POT CLAVULANATE 875-125 MG PO TABS: 1.0000 | ORAL_TABLET | Freq: Two times a day (BID) | ORAL | 0 refills | Status: DC

## 2022-11-22 MED ORDER — FLUCONAZOLE 150 MG PO TABS: ORAL_TABLET | ORAL | 0 refills | Status: DC

## 2022-11-22 NOTE — Progress Notes (Signed)
E-Visit for Sinus Problems  We are sorry that you are not feeling well.  Here is how we plan to help!  Based on what you have shared with me it looks like you have sinusitis.  Sinusitis is inflammation and infection in the sinus cavities of the head.  Based on your presentation I believe you most likely have Acute Bacterial Sinusitis.  This is an infection caused by bacteria and is treated with antibiotics. I have prescribed Augmentin 875mg /125mg  one tablet twice daily with food, for 7 days.ibuprofen also sent in diflucan in case you develop a yeast infection. You may use an oral decongestant such as Mucinex D or if you have glaucoma or high blood pressure use plain Mucinex. Saline nasal spray help and can safely be used as often as needed for congestion.  If you develop worsening sinus pain, fever or notice severe headache and vision changes, or if symptoms are not better after completion of antibiotic, please schedule an appointment with a health care provider.    Sinus infections are not as easily transmitted as other respiratory infection, however we still recommend that you avoid close contact with loved ones, especially the very young and elderly.  Remember to wash your hands thoroughly throughout the day as this is the number one way to prevent the spread of infection!  Home Care: Only take medications as instructed by your medical team. Complete the entire course of an antibiotic. Do not take these medications with alcohol. A steam or ultrasonic humidifier can help congestion.  You can place a towel over your head and breathe in the steam from hot water coming from a faucet. Avoid close contacts especially the very young and the elderly. Cover your mouth when you cough or sneeze. Always remember to wash your hands.  Get Help Right Away If: You develop worsening fever or sinus pain. You develop a severe head ache or visual changes. Your symptoms persist after you have completed your  treatment plan.  Make sure you Understand these instructions. Will watch your condition. Will get help right away if you are not doing well or get worse.  Thank you for choosing an e-visit.  Your e-visit answers were reviewed by a board certified advanced clinical practitioner to complete your personal care plan. Depending upon the condition, your plan could have included both over the counter or prescription medications.  Please review your pharmacy choice. Make sure the pharmacy is open so you can pick up prescription now. If there is a problem, you may contact your provider through [employer] and have the prescription routed to another pharmacy.  Your safety is important to [place]. If you have drug allergies check your prescription carefully.   For the next 24 hours you can use MyChart to ask questions about today's visit, request a non-urgent call back, or ask for a work or school excuse. You will get an email in the next two days asking about your experience. I hope that your e-visit has been valuable and will speed your recovery.  Erica [name], FNP   5-10 minutes spent reviewing and documenting in chart.

## 2022-12-02 ENCOUNTER — Other Ambulatory Visit (HOSPITAL_COMMUNITY): Payer: Self-pay

## 2022-12-02 ENCOUNTER — Other Ambulatory Visit: Payer: Self-pay

## 2022-12-02 MED ORDER — GABAPENTIN 300 MG PO CAPS
600.0000 mg | ORAL_CAPSULE | Freq: Four times a day (QID) | ORAL | 2 refills | Status: DC
  Filled 2022-12-02: qty 240, 30d supply, fill #0
  Filled 2022-12-30: qty 240, 30d supply, fill #1
  Filled 2023-01-28: qty 240, 30d supply, fill #2

## 2022-12-02 MED ORDER — HYDROCODONE-ACETAMINOPHEN 5-325 MG PO TABS
1.0000 | ORAL_TABLET | Freq: Three times a day (TID) | ORAL | 0 refills | Status: AC | PRN
  Filled 2022-12-02: qty 45, 15d supply, fill #0

## 2022-12-03 ENCOUNTER — Other Ambulatory Visit: Payer: Self-pay

## 2022-12-03 ENCOUNTER — Other Ambulatory Visit (HOSPITAL_COMMUNITY): Payer: Self-pay

## 2022-12-08 ENCOUNTER — Encounter: Payer: Self-pay | Admitting: Neurology

## 2022-12-08 MED ORDER — ERENUMAB-AOOE 140 MG/ML ~~LOC~~ SOAJ: 140.0000 mg | SUBCUTANEOUS | 3 refills | Status: DC

## 2022-12-09 ENCOUNTER — Other Ambulatory Visit: Payer: Self-pay

## 2022-12-09 ENCOUNTER — Other Ambulatory Visit (HOSPITAL_COMMUNITY): Payer: Self-pay

## 2022-12-09 MED ORDER — ERENUMAB-AOOE 140 MG/ML ~~LOC~~ SOAJ
140.0000 mg | SUBCUTANEOUS | 3 refills | Status: DC
  Filled 2022-12-09 – 2022-12-16 (×3): qty 3, 90d supply, fill #0
  Filled 2023-03-09: qty 3, 90d supply, fill #1
  Filled 2023-05-28: qty 1, 30d supply, fill #2
  Filled 2023-07-14: qty 1, 30d supply, fill #3
  Filled 2023-08-15: qty 1, 30d supply, fill #4
  Filled 2023-09-09: qty 3, 90d supply, fill #5

## 2022-12-09 NOTE — Addendum Note (Signed)
Addended by: Bertram Savin on: 12/09/2022 07:36 AM   Modules accepted: Orders

## 2022-12-10 NOTE — Telephone Encounter (Signed)
Called pt. Informed pt I was calling to schedule an medication management appointment with [facility]. I offered pt 6/17 with Aundra Millet, pt said we are short handed at work and I can't take that appointment. I then offered 8/17 a mychart appointment with Aundra Millet. Pt said well I normal see's Dr. Lucia Gaskins and what's her next's appointment, I said she is booking into September but I was informed to schedule you with [facility]. Pt said well I will just call back later when I make my mind up.

## 2022-12-10 NOTE — Telephone Encounter (Signed)
Ok thanks 

## 2022-12-12 ENCOUNTER — Other Ambulatory Visit: Payer: Self-pay

## 2022-12-17 ENCOUNTER — Other Ambulatory Visit: Payer: Self-pay

## 2022-12-19 DIAGNOSIS — M47812 Spondylosis without myelopathy or radiculopathy, cervical region: Secondary | ICD-10-CM | POA: Diagnosis not present

## 2022-12-30 ENCOUNTER — Telehealth: Payer: Commercial Managed Care - PPO | Admitting: Adult Health

## 2022-12-30 ENCOUNTER — Other Ambulatory Visit (HOSPITAL_COMMUNITY): Payer: Self-pay

## 2022-12-31 ENCOUNTER — Other Ambulatory Visit: Payer: Self-pay

## 2022-12-31 DIAGNOSIS — M791 Myalgia, unspecified site: Secondary | ICD-10-CM | POA: Diagnosis not present

## 2023-01-14 ENCOUNTER — Other Ambulatory Visit: Payer: Self-pay

## 2023-01-16 ENCOUNTER — Encounter: Payer: Self-pay | Admitting: Nurse Practitioner

## 2023-01-16 ENCOUNTER — Other Ambulatory Visit (HOSPITAL_COMMUNITY): Payer: Self-pay

## 2023-01-16 ENCOUNTER — Ambulatory Visit: Payer: Commercial Managed Care - PPO | Admitting: Nurse Practitioner

## 2023-01-16 VITALS — BP 118/78 | HR 82 | Temp 98.0°F | Resp 20 | Ht 61.0 in | Wt 111.0 lb

## 2023-01-16 DIAGNOSIS — F411 Generalized anxiety disorder: Secondary | ICD-10-CM

## 2023-01-16 DIAGNOSIS — F5101 Primary insomnia: Secondary | ICD-10-CM | POA: Diagnosis not present

## 2023-01-16 DIAGNOSIS — G43711 Chronic migraine without aura, intractable, with status migrainosus: Secondary | ICD-10-CM | POA: Diagnosis not present

## 2023-01-16 MED ORDER — BUPROPION HCL ER (XL) 300 MG PO TB24
300.0000 mg | ORAL_TABLET | Freq: Every day | ORAL | 1 refills | Status: DC
Start: 2023-01-16 — End: 2023-06-26
  Filled 2023-01-16: qty 90, 90d supply, fill #0
  Filled 2023-04-06: qty 90, 90d supply, fill #1

## 2023-01-16 MED ORDER — ZOLPIDEM TARTRATE 10 MG PO TABS
10.0000 mg | ORAL_TABLET | Freq: Every evening | ORAL | 5 refills | Status: DC | PRN
Start: 2023-01-16 — End: 2023-06-26
  Filled 2023-01-16 – 2023-02-09 (×2): qty 30, 30d supply, fill #0
  Filled 2023-03-09 – 2023-03-11 (×2): qty 30, 30d supply, fill #1
  Filled 2023-04-06 – 2023-04-08 (×2): qty 30, 30d supply, fill #2
  Filled 2023-05-11: qty 30, 30d supply, fill #3
  Filled 2023-06-01 – 2023-06-08 (×2): qty 30, 30d supply, fill #4

## 2023-01-16 NOTE — Progress Notes (Signed)
Subjective:    Patient ID: Erica Rodriguez, female    DOB: 24-Jul-1985, 37 y.o.   MRN: [id number]   Chief Complaint: medical management of chronic issues     HPI:  Erica Rodriguez is a 37 y.o. who identifies as a female who was assigned female at birth.   Social history: Lives with: husband and her mother Work history: [employer]   Comes in today for follow up of the following chronic medical issues:  1. Chronic migraine without aura, with intractable migraine, so stated, with status migrainosus Has occasioanlly but neyrology is keeping her under control.  2. GAD (generalized anxiety disorder) Is on wellbutrin and is doing ok.    01/16/2023    3:12 PM 07/18/2022    2:31 PM 01/27/2022    9:39 AM  Depression screen PHQ 2/9  Decreased Interest 0 0 1  Down, Depressed, Hopeless 0 0 0  PHQ - 2 Score 0 0 1  Altered sleeping 2 2 2   Tired, decreased energy 2 2 2   Change in appetite 0 0 0  Feeling bad or failure about yourself  0 0 0  Trouble concentrating 0 0 0  Moving slowly or fidgety/restless 0 0 0  Suicidal thoughts 0 0 0  PHQ-9 Score 4 4 5   Difficult doing work/chores Not difficult at all Not difficult at all Not difficult at all      01/16/2023    3:12 PM 07/18/2022    2:31 PM 01/27/2022    9:40 AM 10/10/2021   10:54 AM  GAD 7 : Generalized Anxiety Score  Nervous, Anxious, on Edge 0 1 0 1  Control/stop worrying 1 1 1 1   Worry too much - different things 0 0 2 2  Trouble relaxing 0 0 2 1  Restless 0 0 0 0  Easily annoyed or irritable 1 1 2 2   Afraid - awful might happen 0 0 0 0  Total GAD 7 Score 2 3 7 7   Anxiety Difficulty Not difficult at all Not difficult at all Not difficult at all Not difficult at all      3. Primary insomnia Is on ambien. Cannot sleep without meds   New complaints: None today  No Known Allergies Outpatient Encounter Medications as of 01/16/2023  Medication Sig   amoxicillin-clavulanate (AUGMENTIN) 875-125 MG tablet Take 1 tablet by mouth  2 (two) times daily.   botulinum toxin Type A (BOTOX) 200 units injection Provider to inject 155 units into the muscles of the head and neck every 12 weeks. Discard remainder.   buPROPion (WELLBUTRIN XL) 300 MG 24 hr tablet Take 1 tablet (300 mg total) by mouth daily.   celecoxib (CELEBREX) 200 MG capsule Take 1 capsule (200 mg total) by mouth 1 to  2 (two) times daily with food for joint pain   cyanocobalamin (VITAMIN B12) 1000 MCG/ML injection Inject 1 mL (1,000 mcg total) into the muscle every 30 days   cyclobenzaprine (FLEXERIL) 5 MG tablet Take 1 tablet (5 mg total) by mouth 3 (three) times daily as needed for muscle spasms   diazepam (VALIUM) 5 MG tablet Take 1 tablet (5 mg total) by mouth every 12 (twelve) hours as needed for anxiety.   Erenumab-aooe 140 MG/ML SOAJ Inject 140 mg into the skin every 30 (thirty) days.   fluconazole (DIFLUCAN) 150 MG tablet 1 po q week x 4 weeks   gabapentin (NEURONTIN) 300 MG capsule Take 2 capsules (600 mg total) by mouth 4 (four)  times daily as directed.   ketorolac (TORADOL) 60 MG/2ML SOLN injection Inject 2 mLs (60 mg total) into the muscle, at onset of migraine. May repeat in 6 hours. Use a max of 2 times daily and 4 days a month.   methylPREDNISolone (MEDROL DOSEPAK) 4 MG TBPK tablet Take pills daily all together with food. Take the first dose (6 pills) as soon as possible. Take the rest each morning. For 6 days total 6-5-4-3-2-1.   MIRENA, 52 MG, 20 MCG/24HR IUD    OnabotulinumtoxinA (BOTOX IM) Inject into the muscle. 155units IM to head and neck every 3 months for migraines.   ondansetron (ZOFRAN-ODT) 4 MG disintegrating tablet Dissolve 1-2 tablets (4-8 mg total) by mouth every 8 (eight) hours as needed for nausea or vomiting.   Oxymetazoline HCl (UPNEEQ) 0.1 % SOLN Apply 1 drop to eye daily.   propranolol ER (INDERAL LA) 80 MG 24 hr capsule Take 1 capsule (80 mg total) by mouth daily.   topiramate (TOPAMAX) 100 MG tablet Take 1 tablet (100 mg total) by  mouth 2 (two) times daily.   Ubrogepant (UBRELVY) 100 MG TABS Take 1 tablet by mouth daily as needed. Take one tablet at onset of headache, may repeat 1 tablet in 2 hours, no more than 2 tablets in 24 hours   zolpidem (AMBIEN) 10 MG tablet Take 1 tablet (10 mg total) by mouth at bedtime as needed for sleep.   Facility-Administered Encounter Medications as of 01/16/2023  Medication   cyanocobalamin ((VITAMIN B-12)) injection 1,000 mcg    Past Surgical History:  Procedure Laterality Date   CARPAL TUNNEL RELEASE Right 2021   carpel tunnel - left     CESAREAN SECTION     FOOT SURGERY Bilateral    PLANTAR FASCIA RELEASE  2021   steroid injections in neck     By Dr. Paschal Dopp NERVE REPAIR     WISDOM TOOTH EXTRACTION      Family History  Problem Relation Age of Onset   Thyroid disease Mother    Hyperlipidemia Mother    Arthritis/Rheumatoid Mother    Diabetes Mother    Stroke Mother    Hypertension Father    Kidney disease Father    Cirrhosis Father    Liver cancer Father    Diabetes Maternal Grandmother    Stroke Maternal Grandmother    Breast cancer Maternal Grandmother    Stroke Maternal Grandfather    Healthy Son       Controlled substance contract: n/a     Review of Systems  Constitutional:  Negative for diaphoresis.  Eyes:  Negative for pain.  Respiratory:  Negative for shortness of breath.   Cardiovascular:  Negative for chest pain, palpitations and leg swelling.  Gastrointestinal:  Negative for abdominal pain.  Endocrine: Negative for polydipsia.  Skin:  Negative for rash.  Neurological:  Negative for dizziness, weakness and headaches.  Hematological:  Does not bruise/bleed easily.  All other systems reviewed and are negative.      Objective:   Physical Exam Vitals and nursing note reviewed.  Constitutional:      General: She is not in acute distress.    Appearance: Normal appearance. She is well-developed.  Neck:     Vascular: No carotid bruit  or JVD.  Cardiovascular:     Rate and Rhythm: Normal rate and regular rhythm.     Heart sounds: Normal heart sounds.  Pulmonary:     Effort: Pulmonary effort is normal. No respiratory  distress.     Breath sounds: Normal breath sounds. No wheezing or rales.  Chest:     Chest wall: No tenderness.  Abdominal:     General: Bowel sounds are normal. There is no distension or abdominal bruit.     Palpations: Abdomen is soft. There is no hepatomegaly, splenomegaly, mass or pulsatile mass.     Tenderness: There is no abdominal tenderness.  Musculoskeletal:        General: Normal range of motion.     Cervical back: Normal range of motion and neck supple.  Lymphadenopathy:     Cervical: No cervical adenopathy.  Skin:    General: Skin is warm and dry.  Neurological:     Mental Status: She is alert and oriented to person, place, and time.     Deep Tendon Reflexes: Reflexes are normal and symmetric.  Psychiatric:        Behavior: Behavior normal.        Thought Content: Thought content normal.        Judgment: Judgment normal.    BP 118/78   Pulse 82   Temp 98 F (36.7 C) (Temporal)   Resp 20   Ht 5\' 1"  (1.549 m)   Wt 111 lb (50.3 kg)   SpO2 100%   BMI 20.97 kg/m         Assessment & Plan:   WILLODENE EREKSON comes in today with chief complaint of Medical Management of Chronic Issues   Diagnosis and orders addressed:  1. Chronic migraine without aura, with intractable migraine, so stated, with status migrainosus Keep follow up with neurology  2. GAD (generalized anxiety disorder) Stress management - buPROPion (WELLBUTRIN XL) 300 MG 24 hr tablet; Take 1 tablet (300 mg total) by mouth daily.  Dispense: 90 tablet; Refill: 1  3. Primary insomnia Bedtime routine - zolpidem (AMBIEN) 10 MG tablet; Take 1 tablet (10 mg total) by mouth at bedtime as needed for sleep.  Dispense: 30 tablet; Refill: 5   Labs pending Health Maintenance reviewed Diet and exercise encouraged  Follow  up plan: 6 months   [name], FNP

## 2023-01-19 ENCOUNTER — Other Ambulatory Visit: Payer: Self-pay

## 2023-01-20 ENCOUNTER — Other Ambulatory Visit: Payer: Self-pay

## 2023-01-20 ENCOUNTER — Other Ambulatory Visit (HOSPITAL_COMMUNITY): Payer: Self-pay

## 2023-01-20 DIAGNOSIS — E538 Deficiency of other specified B group vitamins: Secondary | ICD-10-CM

## 2023-01-20 DIAGNOSIS — R5383 Other fatigue: Secondary | ICD-10-CM

## 2023-01-21 ENCOUNTER — Other Ambulatory Visit: Payer: Commercial Managed Care - PPO

## 2023-01-21 DIAGNOSIS — R5383 Other fatigue: Secondary | ICD-10-CM | POA: Diagnosis not present

## 2023-01-22 LAB — CMP14+EGFR
ALT: 22 IU/L (ref 0–32)
AST: 13 IU/L (ref 0–40)
Albumin: 4.3 g/dL (ref 3.9–4.9)
Alkaline Phosphatase: 57 IU/L (ref 44–121)
BUN/Creatinine Ratio: 15 (ref 9–23)
BUN: 11 mg/dL (ref 6–20)
Bilirubin Total: 0.5 mg/dL (ref 0.0–1.2)
CO2: 22 mmol/L (ref 20–29)
Calcium: 9.4 mg/dL (ref 8.7–10.2)
Chloride: 107 mmol/L — ABNORMAL HIGH (ref 96–106)
Creatinine, Ser: 0.74 mg/dL (ref 0.57–1.00)
Globulin, Total: 2.6 g/dL (ref 1.5–4.5)
Glucose: 82 mg/dL (ref 70–99)
Potassium: 4.6 mmol/L (ref 3.5–5.2)
Sodium: 142 mmol/L (ref 134–144)
Total Protein: 6.9 g/dL (ref 6.0–8.5)
eGFR: 107 mL/min/1.73 (ref >59)

## 2023-01-22 LAB — CBC WITH DIFFERENTIAL/PLATELET
Basophils Absolute: 0 x10E3/uL (ref 0.0–0.2)
Basos: 0 %
EOS (ABSOLUTE): 0.1 x10E3/uL (ref 0.0–0.4)
Eos: 1 %
Hematocrit: 41.7 % (ref 34.0–46.6)
Hemoglobin: 13.5 g/dL (ref 11.1–15.9)
Immature Grans (Abs): 0 x10E3/uL (ref 0.0–0.1)
Immature Granulocytes: 0 %
Lymphocytes Absolute: 1.5 x10E3/uL (ref 0.7–3.1)
Lymphs: 28 %
MCH: 33.1 pg — ABNORMAL HIGH (ref 26.6–33.0)
MCHC: 32.4 g/dL (ref 31.5–35.7)
MCV: 102 fL — ABNORMAL HIGH (ref 79–97)
Monocytes Absolute: 0.4 x10E3/uL (ref 0.1–0.9)
Monocytes: 8 %
Neutrophils Absolute: 3.4 x10E3/uL (ref 1.4–7.0)
Neutrophils: 63 %
Platelets: 316 x10E3/uL (ref 150–450)
RBC: 4.08 x10E6/uL (ref 3.77–5.28)
RDW: 11.9 % (ref 11.7–15.4)
WBC: 5.4 x10E3/uL (ref 3.4–10.8)

## 2023-01-22 LAB — VITAMIN D 25 HYDROXY (VIT D DEFICIENCY, FRACTURES): Vit D, 25-Hydroxy: 47.4 ng/mL (ref 30.0–100.0)

## 2023-01-22 LAB — LIPID PANEL
Chol/HDL Ratio: 2.7 ratio (ref 0.0–4.4)
Cholesterol, Total: 153 mg/dL (ref 100–199)
HDL: 57 mg/dL (ref >39)
LDL Chol Calc (NIH): 88 mg/dL (ref 0–99)
Triglycerides: 32 mg/dL (ref 0–149)
VLDL Cholesterol Cal: 8 mg/dL (ref 5–40)

## 2023-01-22 LAB — VITAMIN B12: Vitamin B-12: 651 pg/mL (ref 232–1245)

## 2023-01-23 LAB — SPECIMEN STATUS REPORT

## 2023-01-23 LAB — THYROID PANEL WITH TSH

## 2023-01-24 LAB — THYROID PANEL WITH TSH: TSH: 1 u[IU]/mL (ref 0.450–4.500)

## 2023-01-26 ENCOUNTER — Other Ambulatory Visit (HOSPITAL_COMMUNITY): Payer: Self-pay

## 2023-01-28 ENCOUNTER — Other Ambulatory Visit (HOSPITAL_COMMUNITY): Payer: Self-pay

## 2023-01-29 ENCOUNTER — Other Ambulatory Visit: Payer: Self-pay

## 2023-01-29 ENCOUNTER — Other Ambulatory Visit (HOSPITAL_COMMUNITY): Payer: Self-pay

## 2023-01-29 MED ORDER — CELECOXIB 200 MG PO CAPS
200.0000 mg | ORAL_CAPSULE | Freq: Two times a day (BID) | ORAL | 1 refills | Status: DC
  Filled 2023-01-29: qty 60, 30d supply, fill #0
  Filled 2023-06-29: qty 60, 30d supply, fill #1

## 2023-02-03 ENCOUNTER — Ambulatory Visit (INDEPENDENT_AMBULATORY_CARE_PROVIDER_SITE_OTHER): Payer: Commercial Managed Care - PPO | Admitting: Adult Health

## 2023-02-03 DIAGNOSIS — G43709 Chronic migraine without aura, not intractable, without status migrainosus: Secondary | ICD-10-CM

## 2023-02-03 MED ORDER — ONABOTULINUMTOXINA 200 UNITS IJ SOLR
155.0000 [IU] | Freq: Once | INTRAMUSCULAR | Status: AC
Start: 2023-02-03 — End: 2023-02-03
  Administered 2023-02-03: 155 [IU] via INTRAMUSCULAR

## 2023-02-03 NOTE — Progress Notes (Signed)
Botox- 200 units x 1 vial Lot: [id number] Expiration: 06/2025 NDC: 5409-8119-14  Bacteriostatic 0.9% Sodium Chloride- 4mL total [id number] Expiration: 05/07/2024 NDC: 0865-7846-96  Dx: E95.284 SP  Witnessed by: Alveria Apley

## 2023-02-03 NOTE — Progress Notes (Signed)
02/03/23: Stable- gets migraines when she is due for botox. Uses ubrelvy and ibuprofen when she does get a migraine. Reports that the last two injections with Dr. Lucia Gaskins she had ptosis in the right eye. I injected higher in the forehead. Also offered to not injected the frontalis or corrugators but she wanted the injections.    BOTOX PROCEDURE NOTE FOR MIGRAINE HEADACHE    Contraindications and precautions discussed with patient(above). Aseptic procedure was observed and patient tolerated procedure. Procedure performed by Butch Penny, NP  The condition has existed for more than 6 months, and pt does not have a diagnosis of ALS, Myasthenia Gravis or Lambert-Eaton Syndrome.  Risks and benefits of injections discussed and pt agrees to proceed with the procedure.  Written consent obtained   Indication/Diagnosis: chronic migraine BOTOX(J0585) injection was performed according to protocol by [employer]. 200 units of BOTOX was dissolved into 4 cc NS.   NDC: [address]-40  Type of toxin: Botox  Botox- 200 units x 1 vial Lot: [id number] Expiration: 06/2025 NDC: 8295-6213-08   Bacteriostatic 0.9% Sodium Chloride- 4mL total [id number] Expiration: 05/07/2024 NDC: 9528-4132-44   Dx: W10.272 SP    Description of procedure:  The patient was placed in a sitting position. The standard protocol was used for Botox as follows, with 5 units of Botox injected at each site:   -Procerus muscle, midline injection  -Corrugator muscle, bilateral injection  -Frontalis muscle, bilateral injection, with 2 sites each side, medial injection was performed in the upper one third of the frontalis muscle, in the region vertical from the medial inferior edge of the superior orbital rim. The lateral injection was again in the upper one third of the forehead vertically above the lateral limbus of the cornea, 1.5 cm lateral to the medial injection site.  -Temporalis muscle injection, 4 sites, bilaterally. The  first injection was 3 cm above the tragus of the ear, second injection site was 1.5 cm to 3 cm up from the first injection site in line with the tragus of the ear. The third injection site was 1.5-3 cm forward between the first 2 injection sites. The fourth injection site was 1.5 cm posterior to the second injection site.  -Occipitalis muscle injection, 3 sites, bilaterally. The first injection was done one half way between the occipital protuberance and the tip of the mastoid process behind the ear. The second injection site was done lateral and superior to the first, 1 fingerbreadth from the first injection. The third injection site was 1 fingerbreadth superiorly and medially from the first injection site.  -Cervical paraspinal muscle injection, 2 sites, bilateral knee first injection site was 1 cm from the midline of the cervical spine, 3 cm inferior to the lower border of the occipital protuberance. The second injection site was 1.5 cm superiorly and laterally to the first injection site.  -Trapezius muscle injection was performed at 3 sites, bilaterally. The first injection site was in the upper trapezius muscle halfway between the inflection point of the neck, and the acromion. The second injection site was one half way between the acromion and the first injection site. The third injection was done between the first injection site and the inflection point of the neck.   Will return for repeat injection in 3 months.   A 200 unit sof Botox was used, 155 units were injected, the rest of the Botox was wasted. The patient tolerated the procedure well, there were no complications of the above procedure.  Butch Penny, MSN,  NP-C 02/03/2023, 7:42 AM [facility] [address] [place], [place] [address] [phone]

## 2023-02-05 DIAGNOSIS — M5416 Radiculopathy, lumbar region: Secondary | ICD-10-CM | POA: Diagnosis not present

## 2023-02-09 ENCOUNTER — Telehealth: Payer: Self-pay

## 2023-02-09 ENCOUNTER — Other Ambulatory Visit: Payer: Self-pay

## 2023-02-09 ENCOUNTER — Other Ambulatory Visit (HOSPITAL_COMMUNITY): Payer: Self-pay

## 2023-02-09 MED ORDER — CITALOPRAM HYDROBROMIDE 20 MG PO TABS
20.0000 mg | ORAL_TABLET | Freq: Every day | ORAL | 5 refills | Status: DC
  Filled 2023-02-09: qty 30, 30d supply, fill #0
  Filled 2023-06-29: qty 30, 30d supply, fill #1

## 2023-02-09 NOTE — Addendum Note (Signed)
Addended by: Bennie Pierini on: 02/09/2023 03:59 PM   Modules accepted: Orders

## 2023-02-09 NOTE — Telephone Encounter (Signed)
Patient states that she is so tired all the time. She says she is taking the Wellbutrin 300 daily and wonders if she should try something different or something to go along with it. She is sleeping all day. Still taking B-12 monthly. Please advise

## 2023-02-09 NOTE — Telephone Encounter (Signed)
Labs all look well. We can try something with the wellbutrin but that will only help if she is sleeping due to depression.

## 2023-02-09 NOTE — Telephone Encounter (Signed)
Will try celexa 20mg  -prescription sent to [facility] outpatient pharmacy

## 2023-02-09 NOTE — Telephone Encounter (Signed)
What would you recommend? To go along with the wellbutrim

## 2023-02-09 NOTE — Telephone Encounter (Signed)
Lmtcb.

## 2023-02-10 ENCOUNTER — Other Ambulatory Visit (HOSPITAL_COMMUNITY): Payer: Self-pay

## 2023-02-10 ENCOUNTER — Encounter (HOSPITAL_COMMUNITY): Payer: Self-pay

## 2023-02-10 ENCOUNTER — Other Ambulatory Visit: Payer: Self-pay

## 2023-02-10 MED ORDER — HYDROCODONE-ACETAMINOPHEN 5-325 MG PO TABS
1.0000 | ORAL_TABLET | Freq: Three times a day (TID) | ORAL | 0 refills | Status: AC
  Filled 2023-02-10: qty 45, 15d supply, fill #0

## 2023-02-11 ENCOUNTER — Other Ambulatory Visit: Payer: Self-pay

## 2023-02-11 ENCOUNTER — Other Ambulatory Visit (HOSPITAL_COMMUNITY): Payer: Self-pay

## 2023-02-11 NOTE — Telephone Encounter (Signed)
Patient notified and verbalized understanding. 

## 2023-02-24 ENCOUNTER — Ambulatory Visit: Payer: Commercial Managed Care - PPO | Admitting: Neurology

## 2023-02-25 ENCOUNTER — Telehealth: Payer: Commercial Managed Care - PPO | Admitting: Emergency Medicine

## 2023-02-25 DIAGNOSIS — B3731 Acute candidiasis of vulva and vagina: Secondary | ICD-10-CM

## 2023-02-25 MED ORDER — FLUCONAZOLE 150 MG PO TABS: 150.0000 mg | ORAL_TABLET | ORAL | 0 refills | Status: AC

## 2023-02-25 NOTE — Progress Notes (Signed)

## 2023-03-03 ENCOUNTER — Other Ambulatory Visit (HOSPITAL_COMMUNITY): Payer: Self-pay

## 2023-03-04 ENCOUNTER — Other Ambulatory Visit: Payer: Self-pay

## 2023-03-09 ENCOUNTER — Other Ambulatory Visit (HOSPITAL_COMMUNITY): Payer: Self-pay

## 2023-03-10 ENCOUNTER — Other Ambulatory Visit: Payer: Self-pay

## 2023-03-10 ENCOUNTER — Other Ambulatory Visit (HOSPITAL_COMMUNITY): Payer: Self-pay

## 2023-03-10 MED ORDER — GABAPENTIN 300 MG PO CAPS
600.0000 mg | ORAL_CAPSULE | Freq: Four times a day (QID) | ORAL | 2 refills | Status: DC
  Filled 2023-03-10: qty 240, 30d supply, fill #0
  Filled 2023-04-06: qty 240, 30d supply, fill #1
  Filled 2023-05-11: qty 240, 30d supply, fill #2

## 2023-03-11 ENCOUNTER — Other Ambulatory Visit: Payer: Self-pay

## 2023-03-11 ENCOUNTER — Other Ambulatory Visit (HOSPITAL_COMMUNITY): Payer: Self-pay

## 2023-03-11 MED ORDER — HYDROCODONE-ACETAMINOPHEN 5-325 MG PO TABS
1.0000 | ORAL_TABLET | Freq: Three times a day (TID) | ORAL | 0 refills | Status: DC | PRN
  Filled 2023-03-11: qty 45, 15d supply, fill #0

## 2023-03-19 DIAGNOSIS — M47816 Spondylosis without myelopathy or radiculopathy, lumbar region: Secondary | ICD-10-CM | POA: Diagnosis not present

## 2023-03-23 DIAGNOSIS — M5412 Radiculopathy, cervical region: Secondary | ICD-10-CM | POA: Diagnosis not present

## 2023-03-23 DIAGNOSIS — M47896 Other spondylosis, lumbar region: Secondary | ICD-10-CM | POA: Diagnosis not present

## 2023-03-23 DIAGNOSIS — G894 Chronic pain syndrome: Secondary | ICD-10-CM | POA: Diagnosis not present

## 2023-03-23 DIAGNOSIS — M791 Myalgia, unspecified site: Secondary | ICD-10-CM | POA: Diagnosis not present

## 2023-03-23 DIAGNOSIS — M5416 Radiculopathy, lumbar region: Secondary | ICD-10-CM | POA: Diagnosis not present

## 2023-03-23 DIAGNOSIS — M5459 Other low back pain: Secondary | ICD-10-CM | POA: Diagnosis not present

## 2023-03-23 DIAGNOSIS — Z5181 Encounter for therapeutic drug level monitoring: Secondary | ICD-10-CM | POA: Diagnosis not present

## 2023-03-23 DIAGNOSIS — M47812 Spondylosis without myelopathy or radiculopathy, cervical region: Secondary | ICD-10-CM | POA: Diagnosis not present

## 2023-03-23 DIAGNOSIS — Z79899 Other long term (current) drug therapy: Secondary | ICD-10-CM | POA: Diagnosis not present

## 2023-04-06 ENCOUNTER — Other Ambulatory Visit (HOSPITAL_COMMUNITY): Payer: Self-pay

## 2023-04-06 ENCOUNTER — Other Ambulatory Visit: Payer: Self-pay

## 2023-04-06 ENCOUNTER — Other Ambulatory Visit: Payer: Self-pay | Admitting: Neurology

## 2023-04-06 DIAGNOSIS — M62838 Other muscle spasm: Secondary | ICD-10-CM

## 2023-04-06 MED ORDER — CYCLOBENZAPRINE HCL 5 MG PO TABS
5.0000 mg | ORAL_TABLET | Freq: Three times a day (TID) | ORAL | 2 refills | Status: DC | PRN
Start: 2023-04-06 — End: 2023-07-27
  Filled 2023-04-06: qty 60, 20d supply, fill #0
  Filled 2023-05-28: qty 60, 20d supply, fill #1
  Filled 2023-06-29: qty 60, 20d supply, fill #2

## 2023-04-07 ENCOUNTER — Other Ambulatory Visit (HOSPITAL_COMMUNITY): Payer: Self-pay

## 2023-04-07 ENCOUNTER — Other Ambulatory Visit: Payer: Self-pay

## 2023-04-13 NOTE — Telephone Encounter (Signed)
Submitted auth renewal request via cmm, status is pending. Key: [facility]

## 2023-04-14 DIAGNOSIS — M47816 Spondylosis without myelopathy or radiculopathy, lumbar region: Secondary | ICD-10-CM | POA: Diagnosis not present

## 2023-04-14 NOTE — Telephone Encounter (Signed)
PA was approved- PA Case ID: [id number] (04/13/23-04/11/24).

## 2023-04-15 ENCOUNTER — Other Ambulatory Visit: Payer: Self-pay

## 2023-04-15 ENCOUNTER — Other Ambulatory Visit (HOSPITAL_COMMUNITY): Payer: Self-pay

## 2023-04-15 MED ORDER — HYDROCODONE-ACETAMINOPHEN 5-325 MG PO TABS
1.0000 | ORAL_TABLET | Freq: Three times a day (TID) | ORAL | 0 refills | Status: DC | PRN
  Filled 2023-04-15: qty 45, 15d supply, fill #0

## 2023-04-16 ENCOUNTER — Other Ambulatory Visit: Payer: Self-pay

## 2023-04-16 ENCOUNTER — Other Ambulatory Visit (HOSPITAL_COMMUNITY): Payer: Self-pay | Admitting: Pharmacy Technician

## 2023-04-16 ENCOUNTER — Other Ambulatory Visit (HOSPITAL_COMMUNITY): Payer: Self-pay

## 2023-04-16 NOTE — Progress Notes (Signed)
Specialty Pharmacy Refill Coordination Note  Erica Rodriguez is a 37 y.o. female contacted today regarding refills of specialty medication(s) Onabotulinumtoxina   Patient requested Courier to Provider Office   Delivery date: 04/21/23   Verified address: [facility] [address]   Medication will be filled on 04/21/23.  Courier to Md Office 10/16 Appt 10/22

## 2023-04-28 ENCOUNTER — Ambulatory Visit: Payer: Commercial Managed Care - PPO | Admitting: Neurology

## 2023-04-28 DIAGNOSIS — G43709 Chronic migraine without aura, not intractable, without status migrainosus: Secondary | ICD-10-CM | POA: Diagnosis not present

## 2023-04-28 MED ORDER — ONABOTULINUMTOXINA 200 UNITS IJ SOLR
155.0000 [IU] | Freq: Once | INTRAMUSCULAR | Status: AC
Start: 2023-04-28 — End: 2023-04-28
  Administered 2023-04-28: 155 [IU] via INTRAMUSCULAR

## 2023-04-28 NOTE — Progress Notes (Signed)
Botox- 200 units x 1 vial Lot: [id number] Expiration: 06/2025 NDC: 4166-0630-16  Bacteriostatic 0.9% Sodium Chloride- 4 mL  Lot: [id number] Expiration: 10/06/2023 NDC: 3557-3220-25  Dx: K27.062 S/P  Witnessed by Joellen Jersey. RN

## 2023-04-28 NOTE — Progress Notes (Signed)
    04/28/2023: Patient feels her jaw pain exacerbates her migraines so placed 5U each masseter as well as 5U each at the line from the orb oculi to the temporalis muscle area where the pain presents  02/03/23: Stable- gets migraines when she is due for botox. Uses ubrelvy and ibuprofen when she does get a migraine. Reports that the last two injections with Dr. Lucia Gaskins she had ptosis in the right eye. I injected higher in the forehead. Also offered to not injected the frontalis or corrugators but she wanted the injections.       BOTOX PROCEDURE NOTE FOR MIGRAINE HEADACHE    Contraindications and precautions discussed with patient(above). Aseptic procedure was observed and patient tolerated procedure. Procedure performed by Butch Penny, NP  The condition has existed for more than 6 months, and pt does not have a diagnosis of ALS, Myasthenia Gravis or Lambert-Eaton Syndrome.  Risks and benefits of injections discussed and pt agrees to proceed with the procedure.  Written consent obtained   Description of procedure:  The patient was placed in a sitting position. The standard protocol was used for Botox as follows, with 5 units of Botox injected at each site:   -Procerus muscle, midline injection  -Corrugator muscle, bilateral injection  -Frontalis muscle, bilateral injection, with 2 sites each side, medial injection was performed in the upper one third of the frontalis muscle, in the region vertical from the medial inferior edge of the superior orbital rim. The lateral injection was again in the upper one third of the forehead vertically above the lateral limbus of the cornea, 1.5 cm lateral to the medial injection site.  -Temporalis muscle injection, 4 sites, bilaterally. The first injection was 3 cm above the tragus of the ear, second injection site was 1.5 cm to 3 cm up from the first injection site in line with the tragus of the ear. The third injection site was 1.5-3 cm forward between  the first 2 injection sites. The fourth injection site was 1.5 cm posterior to the second injection site.  -Occipitalis muscle injection, 3 sites, bilaterally. The first injection was done one half way between the occipital protuberance and the tip of the mastoid process behind the ear. The second injection site was done lateral and superior to the first, 1 fingerbreadth from the first injection. The third injection site was 1 fingerbreadth superiorly and medially from the first injection site.  -Cervical paraspinal muscle injection, 2 sites, bilateral knee first injection site was 1 cm from the midline of the cervical spine, 3 cm inferior to the lower border of the occipital protuberance. The second injection site was 1.5 cm superiorly and laterally to the first injection site.  -Trapezius muscle injection was performed at 3 sites, bilaterally. The first injection site was in the upper trapezius muscle halfway between the inflection point of the neck, and the acromion. The second injection site was one half way between the acromion and the first injection site. The third injection was done between the first injection site and the inflection point of the neck.   Will return for repeat injection in 3 months.   A 200 unit sof Botox was used, 175 units were injected, the rest of the Botox was wasted. The patient tolerated the procedure well, there were no complications of the above procedure.

## 2023-05-04 ENCOUNTER — Other Ambulatory Visit (INDEPENDENT_AMBULATORY_CARE_PROVIDER_SITE_OTHER): Payer: Commercial Managed Care - PPO

## 2023-05-04 DIAGNOSIS — Z23 Encounter for immunization: Secondary | ICD-10-CM | POA: Diagnosis not present

## 2023-05-08 DIAGNOSIS — M47816 Spondylosis without myelopathy or radiculopathy, lumbar region: Secondary | ICD-10-CM | POA: Diagnosis not present

## 2023-05-08 DIAGNOSIS — M47896 Other spondylosis, lumbar region: Secondary | ICD-10-CM | POA: Diagnosis not present

## 2023-05-11 ENCOUNTER — Other Ambulatory Visit: Payer: Self-pay

## 2023-05-11 ENCOUNTER — Other Ambulatory Visit (HOSPITAL_COMMUNITY): Payer: Self-pay

## 2023-05-13 ENCOUNTER — Other Ambulatory Visit: Payer: Self-pay

## 2023-05-19 ENCOUNTER — Encounter: Payer: Self-pay | Admitting: Neurology

## 2023-05-21 MED ORDER — METHYLPREDNISOLONE 4 MG PO TBPK: ORAL_TABLET | ORAL | 0 refills | Status: DC

## 2023-05-21 NOTE — Addendum Note (Signed)
Addended by: Bertram Savin on: 05/21/2023 01:22 PM   Modules accepted: Orders

## 2023-05-28 DIAGNOSIS — M47812 Spondylosis without myelopathy or radiculopathy, cervical region: Secondary | ICD-10-CM | POA: Diagnosis not present

## 2023-05-29 ENCOUNTER — Ambulatory Visit: Payer: Commercial Managed Care - PPO | Admitting: Nurse Practitioner

## 2023-05-29 ENCOUNTER — Other Ambulatory Visit (HOSPITAL_COMMUNITY): Payer: Self-pay

## 2023-05-29 ENCOUNTER — Other Ambulatory Visit: Payer: Self-pay

## 2023-06-01 ENCOUNTER — Other Ambulatory Visit: Payer: Self-pay

## 2023-06-01 ENCOUNTER — Other Ambulatory Visit: Payer: Self-pay | Admitting: Neurology

## 2023-06-01 ENCOUNTER — Other Ambulatory Visit (HOSPITAL_COMMUNITY): Payer: Self-pay

## 2023-06-01 DIAGNOSIS — G43709 Chronic migraine without aura, not intractable, without status migrainosus: Secondary | ICD-10-CM

## 2023-06-02 ENCOUNTER — Other Ambulatory Visit (HOSPITAL_COMMUNITY): Payer: Self-pay

## 2023-06-02 ENCOUNTER — Other Ambulatory Visit: Payer: Self-pay

## 2023-06-02 ENCOUNTER — Ambulatory Visit: Payer: Commercial Managed Care - PPO | Admitting: Nurse Practitioner

## 2023-06-02 ENCOUNTER — Encounter: Payer: Self-pay | Admitting: Nurse Practitioner

## 2023-06-02 VITALS — BP 117/81 | HR 102 | Temp 98.0°F | Resp 20 | Ht 61.0 in | Wt 111.0 lb

## 2023-06-02 DIAGNOSIS — R232 Flushing: Secondary | ICD-10-CM | POA: Diagnosis not present

## 2023-06-02 MED ORDER — UBRELVY 100 MG PO TABS
100.0000 mg | ORAL_TABLET | Freq: Every day | ORAL | 11 refills | Status: DC | PRN
  Filled 2023-06-02 – 2023-06-08 (×2): qty 16, 30d supply, fill #0
  Filled 2023-06-29 – 2023-07-06 (×2): qty 16, 30d supply, fill #1
  Filled 2023-08-05: qty 16, 30d supply, fill #2
  Filled 2023-09-04: qty 16, 30d supply, fill #3
  Filled 2023-11-02: qty 16, 30d supply, fill #4
  Filled 2023-11-03 – 2023-11-27 (×2): qty 16, 30d supply, fill #5
  Filled 2023-12-30: qty 16, 30d supply, fill #6
  Filled 2024-01-25: qty 16, 30d supply, fill #7
  Filled 2024-02-29: qty 16, 30d supply, fill #8
  Filled 2024-03-30: qty 16, 30d supply, fill #9
  Filled 2024-05-02: qty 16, 30d supply, fill #10

## 2023-06-02 MED ORDER — GABAPENTIN 300 MG PO CAPS
600.0000 mg | ORAL_CAPSULE | Freq: Four times a day (QID) | ORAL | 2 refills | Status: DC
  Filled 2023-06-08: qty 240, 30d supply, fill #0

## 2023-06-02 NOTE — Progress Notes (Signed)
Subjective:    Patient ID: Erica Rodriguez, female    DOB: 25-Jun-1986, 37 y.o.   MRN: [id number]   Chief Complaint: Episodes of hot flashes   HPI  Patient comes in c/o hot flashes and facial flushing. She has an IUD in and therefore does not have a period. Patient Active Problem List   Diagnosis Date Noted   Dupuytren's contracture of right hand 07/29/2021   Plantar fasciitis 12/25/2020   Ulnar nerve compression, right 12/25/2020   Bilateral carpal tunnel syndrome 02/15/2020   Chronic migraine without aura, with intractable migraine, so stated, with status migrainosus 12/21/2018   Insomnia 10/07/2012   GAD (generalized anxiety disorder) 10/07/2012       Review of Systems  Constitutional:  Negative for diaphoresis.  Eyes:  Negative for pain.  Respiratory:  Negative for shortness of breath.   Cardiovascular:  Negative for chest pain, palpitations and leg swelling.  Gastrointestinal:  Negative for abdominal pain.  Endocrine: Negative for polydipsia.  Skin:  Negative for rash.  Neurological:  Negative for dizziness, weakness and headaches.  Hematological:  Does not bruise/bleed easily.  All other systems reviewed and are negative.      Objective:   Physical Exam Vitals and nursing note reviewed.  Constitutional:      General: She is not in acute distress.    Appearance: Normal appearance. She is well-developed.  Neck:     Vascular: No carotid bruit or JVD.  Cardiovascular:     Rate and Rhythm: Normal rate and regular rhythm.     Heart sounds: Normal heart sounds.  Pulmonary:     Effort: Pulmonary effort is normal. No respiratory distress.     Breath sounds: Normal breath sounds. No wheezing or rales.  Chest:     Chest wall: No tenderness.  Abdominal:     General: Bowel sounds are normal. There is no distension or abdominal bruit.     Palpations: Abdomen is soft. There is no hepatomegaly, splenomegaly, mass or pulsatile mass.     Tenderness: There is no abdominal  tenderness.  Musculoskeletal:        General: Normal range of motion.     Cervical back: Normal range of motion and neck supple.  Lymphadenopathy:     Cervical: No cervical adenopathy.  Skin:    General: Skin is warm and dry.  Neurological:     Mental Status: She is alert and oriented to person, place, and time.     Deep Tendon Reflexes: Reflexes are normal and symmetric.  Psychiatric:        Behavior: Behavior normal.        Thought Content: Thought content normal.        Judgment: Judgment normal.    BP 117/81   Pulse (!) 102   Temp 98 F (36.7 C) (Temporal)   Resp 20   Ht 5\' 1"  (1.549 m)   Wt 111 lb (50.3 kg)   SpO2 99%   BMI 20.97 kg/m         Assessment & Plan:  [name] in today with chief complaint of Episodes of hot flashes   1. Hot flashes Labs pending- will talk once results are back - AMENORRHEA PROFILE - Thyroid Panel With TSH    The above assessment and management plan was discussed with the patient. The patient verbalized understanding of and has agreed to the management plan. Patient is aware to call the clinic if symptoms persist or worsen. Patient is aware  when to return to the clinic for a follow-up visit. Patient educated on when it is appropriate to go to the emergency department.   [name], FNP

## 2023-06-03 ENCOUNTER — Telehealth: Payer: Self-pay

## 2023-06-03 LAB — THYROID PANEL WITH TSH
Free Thyroxine Index: 2.3 (ref 1.2–4.9)
T3 Uptake Ratio: 30 % (ref 24–39)
T4, Total: 7.7 ug/dL (ref 4.5–12.0)
TSH: 1.97 u[IU]/mL (ref 0.450–4.500)

## 2023-06-03 LAB — AMENORRHEA PROFILE
FSH: 4.2 m[IU]/mL
LH: 4.7 m[IU]/mL
Prolactin: 6.1 ng/mL (ref 4.8–33.4)

## 2023-06-03 NOTE — Telephone Encounter (Signed)
*  [facility]  Pharmacy Patient Advocate Encounter   Received notification from CoverMyMeds that prior authorization for Aimovig 140MG /ML auto-injectors  is required/requested.   Insurance verification completed.   The patient is insured through [facility] .   Per test claim: PA required; PA submitted to above mentioned insurance via CoverMyMeds Key/confirmation #/EOC [id number] Status is pending

## 2023-06-06 ENCOUNTER — Other Ambulatory Visit (HOSPITAL_COMMUNITY): Payer: Self-pay

## 2023-06-06 ENCOUNTER — Other Ambulatory Visit: Payer: Self-pay

## 2023-06-08 ENCOUNTER — Ambulatory Visit: Payer: Commercial Managed Care - PPO | Admitting: Nurse Practitioner

## 2023-06-08 ENCOUNTER — Other Ambulatory Visit: Payer: Self-pay

## 2023-06-08 ENCOUNTER — Other Ambulatory Visit (HOSPITAL_COMMUNITY): Payer: Self-pay

## 2023-06-09 ENCOUNTER — Other Ambulatory Visit: Payer: Self-pay

## 2023-06-12 ENCOUNTER — Ambulatory Visit: Payer: Commercial Managed Care - PPO | Admitting: Nurse Practitioner

## 2023-06-15 ENCOUNTER — Other Ambulatory Visit (HOSPITAL_COMMUNITY): Payer: Self-pay

## 2023-06-15 ENCOUNTER — Encounter: Payer: Self-pay | Admitting: Neurology

## 2023-06-15 ENCOUNTER — Other Ambulatory Visit: Payer: Self-pay

## 2023-06-15 ENCOUNTER — Telehealth: Payer: Commercial Managed Care - PPO | Admitting: Family Medicine

## 2023-06-15 DIAGNOSIS — B3731 Acute candidiasis of vulva and vagina: Secondary | ICD-10-CM | POA: Diagnosis not present

## 2023-06-15 DIAGNOSIS — G43909 Migraine, unspecified, not intractable, without status migrainosus: Secondary | ICD-10-CM

## 2023-06-15 DIAGNOSIS — R110 Nausea: Secondary | ICD-10-CM

## 2023-06-15 MED ORDER — FLUCONAZOLE 150 MG PO TABS: 150.0000 mg | ORAL_TABLET | ORAL | 0 refills | Status: DC

## 2023-06-15 MED ORDER — ONDANSETRON 4 MG PO TBDP
4.0000 mg | ORAL_TABLET | Freq: Three times a day (TID) | ORAL | 11 refills | Status: DC | PRN
  Filled 2023-06-15: qty 30, 5d supply, fill #0
  Filled 2023-08-15: qty 30, 5d supply, fill #1
  Filled 2023-11-04: qty 30, 5d supply, fill #2
  Filled 2023-12-01: qty 30, 5d supply, fill #3
  Filled 2023-12-30: qty 30, 5d supply, fill #4
  Filled 2024-01-25: qty 30, 5d supply, fill #5
  Filled 2024-02-29: qty 30, 5d supply, fill #6
  Filled 2024-03-30: qty 30, 5d supply, fill #7
  Filled 2024-06-14: qty 30, 5d supply, fill #8

## 2023-06-15 NOTE — Telephone Encounter (Signed)
Pharmacy Patient Advocate Encounter  Received notification from [facility] that Prior Authorization for Aimovig 140mg /ml has been APPROVED from 06/03/2023 to 05/31/2024

## 2023-06-15 NOTE — Progress Notes (Signed)

## 2023-06-18 ENCOUNTER — Telehealth: Payer: Commercial Managed Care - PPO | Admitting: Nurse Practitioner

## 2023-06-18 ENCOUNTER — Encounter: Payer: Self-pay | Admitting: Nurse Practitioner

## 2023-06-18 DIAGNOSIS — S0300XD Dislocation of jaw, unspecified side, subsequent encounter: Secondary | ICD-10-CM

## 2023-06-18 MED ORDER — PREDNISONE 20 MG PO TABS: 40.0000 mg | ORAL_TABLET | Freq: Every day | ORAL | 0 refills | Status: AC

## 2023-06-18 MED ORDER — DIAZEPAM 10 MG PO TABS: 10.0000 mg | ORAL_TABLET | Freq: Two times a day (BID) | ORAL | 0 refills | Status: DC | PRN

## 2023-06-18 NOTE — Progress Notes (Signed)
Virtual Visit Consent   Erica Rodriguez, you are scheduled for a virtual visit with [name], FNP, a [facility] provider, today.     Just as with appointments in the office, your consent must be obtained to participate.  Your consent will be active for this visit and any virtual visit you may have with one of our providers in the next 365 days.     If you have a MyChart account, a copy of this consent can be sent to you electronically.  All virtual visits are billed to your insurance company just like a traditional visit in the office.    As this is a virtual visit, video technology does not allow for your provider to perform a traditional examination.  This may limit your provider's ability to fully assess your condition.  If your provider identifies any concerns that need to be evaluated in person or the need to arrange testing (such as labs, EKG, etc.), we will make arrangements to do so.     Although advances in technology are sophisticated, we cannot ensure that it will always work on either your end or our end.  If the connection with a video visit is poor, the visit may have to be switched to a telephone visit.  With either a video or telephone visit, we are not always able to ensure that we have a secure connection.     I need to obtain your verbal consent now.   Are you willing to proceed with your visit today? YES   Erica Rodriguez has provided verbal consent on 06/18/2023 for a virtual visit (video or telephone).   [name], FNP   Date: 06/18/2023 11:37 AM   Virtual Visit via Video Note   I, [name], connected with Erica Rodriguez ([id number], 1985-09-02, 37) on 06/18/23 at 10:45 AM EST by a video-enabled telemedicine application and verified that I am speaking with the correct person using two identifiers.  Location: Patient: Virtual Visit Location Patient: Mobile Provider: Virtual Visit Location Provider: Mobile   I discussed the limitations of  evaluation and management by telemedicine and the availability of in person appointments. The patient expressed understanding and agreed to proceed.    History of Present Illness: Erica Rodriguez is a 37 y.o. who identifies as a female who was assigned female at birth, and is being seen today for TMJ.  HPI: Erica Rodriguez to dentist last week and had work done on right upper back molar. Now her TMJ I flared up. Hard  to open mouth and chew.    Review of Systems  Constitutional:  Negative for diaphoresis and weight loss.  Eyes:  Negative for blurred vision, double vision and pain.  Respiratory:  Negative for shortness of breath.   Cardiovascular:  Negative for chest pain, palpitations, orthopnea and leg swelling.  Gastrointestinal:  Negative for abdominal pain.  Skin:  Negative for rash.  Neurological:  Negative for dizziness, sensory change, loss of consciousness, weakness and headaches.  Endo/Heme/Allergies:  Negative for polydipsia. Does not bruise/bleed easily.  Psychiatric/Behavioral:  Negative for memory loss. The patient does not have insomnia.   All other systems reviewed and are negative.   Problems:  Patient Active Problem List   Diagnosis Date Noted   Dupuytren's contracture of right hand 07/29/2021   Plantar fasciitis 12/25/2020   Ulnar nerve compression, right 12/25/2020   Bilateral carpal tunnel syndrome 02/15/2020   Chronic migraine without aura, with intractable migraine, so stated, with status migrainosus  12/21/2018   Insomnia 10/07/2012   GAD (generalized anxiety disorder) 10/07/2012    Allergies: No Known Allergies Medications:  Current Outpatient Medications:    botulinum toxin Type A (BOTOX) 200 units injection, Provider to inject 155 units into the muscles of the head and neck every 12 weeks. Discard remainder., Disp: 1 each, Rfl: 3   buPROPion (WELLBUTRIN XL) 300 MG 24 hr tablet, Take 1 tablet (300 mg total) by mouth daily., Disp: 90 tablet, Rfl: 1   celecoxib (CELEBREX)  200 MG capsule, Take 1 capsule (200 mg total) by mouth once to twice a day with food for joint pain., Disp: 60 capsule, Rfl: 1   citalopram (CELEXA) 20 MG tablet, Take 1 tablet (20 mg total) by mouth daily., Disp: 30 tablet, Rfl: 5   cyanocobalamin (VITAMIN B12) 1000 MCG/ML injection, Inject 1 mL (1,000 mcg total) into the muscle every 30 days, Disp: 3 mL, Rfl: 12   cyclobenzaprine (FLEXERIL) 5 MG tablet, Take 1 tablet (5 mg total) by mouth 3 (three) times daily as needed for muscle spasms, Disp: 60 tablet, Rfl: 2   Erenumab-aooe 140 MG/ML SOAJ, Inject 140 mg into the skin every 30 (thirty) days., Disp: 3 mL, Rfl: 3   fluconazole (DIFLUCAN) 150 MG tablet, Take 1 tablet (150 mg total) by mouth as directed. Repeat in 3 days as needed, Disp: 2 tablet, Rfl: 0   gabapentin (NEURONTIN) 300 MG capsule, Take 2 capsules (600 mg total) by mouth 4 (four) times daily., Disp: 240 capsule, Rfl: 2   HYDROcodone-acetaminophen (NORCO/VICODIN) 5-325 MG tablet, Take 1 tablet by mouth 3 (three) times daily as needed., Disp: 45 tablet, Rfl: 0   ketorolac (TORADOL) 60 MG/2ML SOLN injection, Inject 2 mLs (60 mg total) into the muscle, at onset of migraine. May repeat in 6 hours. Use a max of 2 times daily and 4 days a month., Disp: 10 mL, Rfl: 0   MIRENA, 52 MG, 20 MCG/24HR IUD, , Disp: , Rfl:    OnabotulinumtoxinA (BOTOX IM), Inject into the muscle. 155units IM to head and neck every 3 months for migraines., Disp: , Rfl:    ondansetron (ZOFRAN-ODT) 4 MG disintegrating tablet, Dissolve 1-2 tablets (4-8 mg total) by mouth every 8 (eight) hours as needed for nausea or vomiting., Disp: 30 tablet, Rfl: 11   propranolol ER (INDERAL LA) 80 MG 24 hr capsule, Take 1 capsule (80 mg total) by mouth daily., Disp: 90 capsule, Rfl: 3   topiramate (TOPAMAX) 100 MG tablet, Take 1 tablet (100 mg total) by mouth 2 (two) times daily., Disp: 180 tablet, Rfl: 3   Ubrogepant (UBRELVY) 100 MG TABS, Take 1 tablet by mouth daily as needed. Take one  tablet at onset of headache, may repeat 1 tablet in 2 hours, no more than 2 tablets in 24 hours, Disp: 16 tablet, Rfl: 11   zolpidem (AMBIEN) 10 MG tablet, Take 1 tablet (10 mg total) by mouth at bedtime as needed for sleep., Disp: 30 tablet, Rfl: 5  Current Facility-Administered Medications:    cyanocobalamin ((VITAMIN B-12)) injection 1,000 mcg, 1,000 mcg, Intramuscular, Q30 days, [name], [name], FNP, 1,000 mcg at 05/26/19 1035  Observations/Objective: Patient is well-developed, well-nourished in no acute distress.  Resting comfortably  at home.  Head is normocephalic, atraumatic.  No labored breathing.  Speech is clear and coherent with logical content.  Patient is alert and oriented at baseline.  Limited ROM of jaw due to pain on opening  Assessment and Plan:  Erica Rodriguez in  today with chief complaint of No chief complaint on file.   1. Dislocation of temporomandibular joint, subsequent encounter (Primary) Moist heat Rest jaw avoid foods that require a lot of chewing - predniSONE (DELTASONE) 20 MG tablet; Take 2 tablets (40 mg total) by mouth daily with breakfast for 5 days. 2 po daily for 5 days  Dispense: 10 tablet; Refill: 0 - diazepam (VALIUM) 10 MG tablet; Take 1 tablet (10 mg total) by mouth every 12 (twelve) hours as needed for anxiety.  Dispense: 30 tablet; Refill: 0    Follow Up Instructions: I discussed the assessment and treatment plan with the patient. The patient was provided an opportunity to ask questions and all were answered. The patient agreed with the plan and demonstrated an understanding of the instructions.  A copy of instructions were sent to the patient via MyChart.  The patient was advised to call back or seek an in-person evaluation if the symptoms worsen or if the condition fails to improve as anticipated.  Time:  I spent 8 minutes with the patient via telehealth technology discussing the above problems/concerns.    [name],  FNP

## 2023-06-18 NOTE — Patient Instructions (Signed)
Temporomandibular Joint Syndrome  Temporomandibular joint syndrome (TMJ syndrome) is a condition that causes pain in the temporomandibular joints. These joints are located near your ears and allow your jaw to open and close. For people with TMJ syndrome, chewing, biting, or other movements of the jaw can be difficult or painful. TMJ syndrome is often mild and goes away within a few weeks. However, sometimes the condition becomes a long-term (chronic) problem. What are the causes? This condition may be caused by: Grinding your teeth or clenching your jaw. Some people do this when they are stressed. Arthritis. An injury to the jaw. A head or neck injury. Teeth or dentures that are not aligned well. In some cases, the cause of TMJ syndrome may not be known. What are the signs or symptoms? The most common symptom of this condition is aching pain on the side of the head in the area of the TMJ. Other symptoms may include: Pain when moving your jaw, such as when chewing or biting. Not being able to open your jaw all the way. Making a clicking sound when you open your mouth. Headache. Earache. Neck or shoulder pain. How is this diagnosed? This condition may be diagnosed based on: Your symptoms and medical history. A physical exam. Your health care provider may check the range of motion of your jaw. Imaging tests, such as X-rays or an MRI. You may also need to see your dentist, who will check if your teeth and jaw are lined up correctly. How is this treated? TMJ syndrome often goes away on its own. If treatment is needed, it may include: Eating soft foods and applying ice or heat. Medicines to relieve pain or inflammation. Medicines or massage to relax the muscles. A splint, bite plate, or mouthpiece to prevent teeth grinding or jaw clenching. Relaxation techniques or counseling to help reduce stress. A therapy for pain in which an electrical current is applied to the nerves through the skin  (transcutaneous electrical nerve stimulation). Acupuncture. This may help to relieve pain. Jaw surgery. This is rarely needed. Follow these instructions at home:  Eating and drinking Eat a soft diet if you are having trouble chewing. Avoid foods that require a lot of chewing. Do not chew gum. General instructions Take over-the-counter and prescription medicines only as told by your health care provider. If directed, put ice on the painful area. To do this: Put ice in a plastic bag. Place a towel between your skin and the bag. Leave the ice on for 20 minutes, 2-3 times a day. Remove the ice if your skin turns bright red. This is very important. If you cannot feel pain, heat, or cold, you have a greater risk of damage to the area. Apply a warm, wet cloth (warm compress) to the painful area as told. Massage your jaw area and do any jaw stretching exercises as told by your health care provider. If you were given a splint, bite plate, or mouthpiece, wear it as told by your health care provider. Keep all follow-up visits. This is important. Where to find more information [employer]: [employer] Contact a health care provider if: You have trouble eating. You have new or worsening symptoms. Get help right away if: Your jaw locks. Summary Temporomandibular joint syndrome (TMJ syndrome) is a condition that causes pain in the temporomandibular joints. These joints are located near your ears and allow your jaw to open and close. TMJ syndrome is often mild and goes away within  a few weeks. However, sometimes the condition becomes a long-term (chronic) problem. Symptoms include an aching pain on the side of the head in the area of the TMJ, pain when chewing or biting, and being unable to open your jaw all the way. You may also make a clicking sound when you open your mouth. TMJ syndrome often goes away on its own. If treatment is needed, it may  include medicines to relieve pain, reduce inflammation, or relax the muscles. A splint, bite plate, or mouthpiece may also be used to prevent teeth grinding or jaw clenching. This information is not intended to replace advice given to you by your health care provider. Make sure you discuss any questions you have with your health care provider. Document Revised: 02/03/2021 Document Reviewed: 02/03/2021 Elsevier Patient Education  2024 [employer].

## 2023-06-22 ENCOUNTER — Ambulatory Visit: Payer: Commercial Managed Care - PPO | Admitting: Nurse Practitioner

## 2023-06-26 ENCOUNTER — Other Ambulatory Visit (HOSPITAL_COMMUNITY): Payer: Self-pay

## 2023-06-26 ENCOUNTER — Encounter: Payer: Self-pay | Admitting: Nurse Practitioner

## 2023-06-26 ENCOUNTER — Other Ambulatory Visit (HOSPITAL_BASED_OUTPATIENT_CLINIC_OR_DEPARTMENT_OTHER): Payer: Self-pay

## 2023-06-26 ENCOUNTER — Ambulatory Visit: Payer: Commercial Managed Care - PPO | Admitting: Nurse Practitioner

## 2023-06-26 VITALS — BP 115/81 | HR 101 | Temp 97.2°F | Ht 61.0 in | Wt 110.0 lb

## 2023-06-26 DIAGNOSIS — F411 Generalized anxiety disorder: Secondary | ICD-10-CM | POA: Diagnosis not present

## 2023-06-26 DIAGNOSIS — S0300XD Dislocation of jaw, unspecified side, subsequent encounter: Secondary | ICD-10-CM

## 2023-06-26 DIAGNOSIS — G43711 Chronic migraine without aura, intractable, with status migrainosus: Secondary | ICD-10-CM

## 2023-06-26 DIAGNOSIS — F5101 Primary insomnia: Secondary | ICD-10-CM | POA: Diagnosis not present

## 2023-06-26 MED ORDER — DIAZEPAM 10 MG PO TABS
10.0000 mg | ORAL_TABLET | Freq: Two times a day (BID) | ORAL | 0 refills | Status: DC | PRN
  Filled 2023-06-26 – 2023-07-27 (×2): qty 30, 15d supply, fill #0

## 2023-06-26 MED ORDER — BUPROPION HCL ER (XL) 300 MG PO TB24
300.0000 mg | ORAL_TABLET | Freq: Every day | ORAL | 1 refills | Status: DC
  Filled 2023-06-26 – 2023-07-14 (×2): qty 90, 90d supply, fill #0
  Filled 2023-10-05: qty 90, 90d supply, fill #1

## 2023-06-26 MED ORDER — ZOLPIDEM TARTRATE 10 MG PO TABS
10.0000 mg | ORAL_TABLET | Freq: Every evening | ORAL | 5 refills | Status: DC | PRN
  Filled 2023-06-26 – 2023-07-06 (×2): qty 30, 30d supply, fill #0
  Filled 2023-08-05: qty 30, 30d supply, fill #1
  Filled 2023-09-04: qty 30, 30d supply, fill #2
  Filled 2023-10-05: qty 30, 30d supply, fill #3
  Filled 2023-11-02 – 2023-11-03 (×2): qty 30, 30d supply, fill #4
  Filled 2023-11-27: qty 30, 30d supply, fill #5

## 2023-06-26 NOTE — Progress Notes (Signed)
Subjective:    Patient ID: Erica Rodriguez, female    DOB: 1985-10-06, 37 y.o.   MRN: [id number]   Chief Complaint: medical management of chronic issues     HPI:  Erica Rodriguez is a 37 y.o. who identifies as a female who was assigned female at birth.   Social history: Lives with: husband and her mother Work history: [employer]   Comes in today for follow up of the following chronic medical issues:  1. Chronic migraine without aura, with intractable migraine, so stated, with status migrainosus Has occasioanlly but neurology is keeping her under control. She still has occasional episodes.  2. GAD (generalized anxiety disorder) Is on wellbutrin and is doing ok.     06/26/2023    4:01 PM 06/02/2023   11:45 AM 01/16/2023    3:12 PM  Depression screen PHQ 2/9  Decreased Interest 2 1 0  Down, Depressed, Hopeless 0 0 0  PHQ - 2 Score 2 1 0  Altered sleeping 2 3 2   Tired, decreased energy 2 1 2   Change in appetite 0 0 0  Feeling bad or failure about yourself  0 0 0  Trouble concentrating 0 1 0  Moving slowly or fidgety/restless 0 0 0  Suicidal thoughts 0 0 0  PHQ-9 Score 6 6 4   Difficult doing work/chores Not difficult at all Not difficult at all Not difficult at all      06/02/2023   11:46 AM 01/16/2023    3:12 PM 07/18/2022    2:31 PM 01/27/2022    9:40 AM  GAD 7 : Generalized Anxiety Score  Nervous, Anxious, on Edge 2 0 1 0  Control/stop worrying 0 1 1 1   Worry too much - different things 2 0 0 2  Trouble relaxing 1 0 0 2  Restless 0 0 0 0  Easily annoyed or irritable 1 1 1 2   Afraid - awful might happen 0 0 0 0  Total GAD 7 Score 6 2 3 7   Anxiety Difficulty Not difficult at all Not difficult at all Not difficult at all Not difficult at all        3. Primary insomnia Is on ambien. Cannot sleep without meds   New complaints: None today  No Known Allergies Outpatient Encounter Medications as of 06/26/2023  Medication Sig   botulinum toxin Type A (BOTOX)  200 units injection Provider to inject 155 units into the muscles of the head and neck every 12 weeks. Discard remainder.   buPROPion (WELLBUTRIN XL) 300 MG 24 hr tablet Take 1 tablet (300 mg total) by mouth daily.   celecoxib (CELEBREX) 200 MG capsule Take 1 capsule (200 mg total) by mouth once to twice a day with food for joint pain.   citalopram (CELEXA) 20 MG tablet Take 1 tablet (20 mg total) by mouth daily.   cyanocobalamin (VITAMIN B12) 1000 MCG/ML injection Inject 1 mL (1,000 mcg total) into the muscle every 30 days   cyclobenzaprine (FLEXERIL) 5 MG tablet Take 1 tablet (5 mg total) by mouth 3 (three) times daily as needed for muscle spasms   diazepam (VALIUM) 10 MG tablet Take 1 tablet (10 mg total) by mouth every 12 (twelve) hours as needed for anxiety.   Erenumab-aooe 140 MG/ML SOAJ Inject 140 mg into the skin every 30 (thirty) days.   fluconazole (DIFLUCAN) 150 MG tablet Take 1 tablet (150 mg total) by mouth as directed. Repeat in 3 days as needed   gabapentin (  NEURONTIN) 300 MG capsule Take 2 capsules (600 mg total) by mouth 4 (four) times daily.   HYDROcodone-acetaminophen (NORCO/VICODIN) 5-325 MG tablet Take 1 tablet by mouth 3 (three) times daily as needed.   ketorolac (TORADOL) 60 MG/2ML SOLN injection Inject 2 mLs (60 mg total) into the muscle, at onset of migraine. May repeat in 6 hours. Use a max of 2 times daily and 4 days a month.   MIRENA, 52 MG, 20 MCG/24HR IUD    OnabotulinumtoxinA (BOTOX IM) Inject into the muscle. 155units IM to head and neck every 3 months for migraines.   ondansetron (ZOFRAN-ODT) 4 MG disintegrating tablet Dissolve 1-2 tablets (4-8 mg total) by mouth every 8 (eight) hours as needed for nausea or vomiting.   propranolol ER (INDERAL LA) 80 MG 24 hr capsule Take 1 capsule (80 mg total) by mouth daily.   topiramate (TOPAMAX) 100 MG tablet Take 1 tablet (100 mg total) by mouth 2 (two) times daily.   Ubrogepant (UBRELVY) 100 MG TABS Take 1 tablet by mouth  daily as needed. Take one tablet at onset of headache, may repeat 1 tablet in 2 hours, no more than 2 tablets in 24 hours   zolpidem (AMBIEN) 10 MG tablet Take 1 tablet (10 mg total) by mouth at bedtime as needed for sleep.   Facility-Administered Encounter Medications as of 06/26/2023  Medication   cyanocobalamin ((VITAMIN B-12)) injection 1,000 mcg    Past Surgical History:  Procedure Laterality Date   CARPAL TUNNEL RELEASE Right 2021   carpel tunnel - left     CESAREAN SECTION     FOOT SURGERY Bilateral    PLANTAR FASCIA RELEASE  2021   steroid injections in neck     By Dr. Paschal Dopp NERVE REPAIR     WISDOM TOOTH EXTRACTION      Family History  Problem Relation Age of Onset   Thyroid disease Mother    Hyperlipidemia Mother    Arthritis/Rheumatoid Mother    Diabetes Mother    Stroke Mother    Hypertension Father    Kidney disease Father    Cirrhosis Father    Liver cancer Father    Diabetes Maternal Grandmother    Stroke Maternal Grandmother    Breast cancer Maternal Grandmother    Stroke Maternal Grandfather    Healthy Son       Controlled substance contract: n/a     Review of Systems  Constitutional:  Negative for diaphoresis.  Eyes:  Negative for pain.  Respiratory:  Negative for shortness of breath.   Cardiovascular:  Negative for chest pain, palpitations and leg swelling.  Gastrointestinal:  Negative for abdominal pain.  Endocrine: Negative for polydipsia.  Skin:  Negative for rash.  Neurological:  Negative for dizziness, weakness and headaches.  Hematological:  Does not bruise/bleed easily.  All other systems reviewed and are negative.      Objective:   Physical Exam Vitals and nursing note reviewed.  Constitutional:      General: She is not in acute distress.    Appearance: Normal appearance. She is well-developed.  Neck:     Vascular: No carotid bruit or JVD.  Cardiovascular:     Rate and Rhythm: Normal rate and regular rhythm.      Heart sounds: Normal heart sounds.  Pulmonary:     Effort: Pulmonary effort is normal. No respiratory distress.     Breath sounds: Normal breath sounds. No wheezing or rales.  Chest:  Chest wall: No tenderness.  Abdominal:     General: Bowel sounds are normal. There is no distension or abdominal bruit.     Palpations: Abdomen is soft. There is no hepatomegaly, splenomegaly, mass or pulsatile mass.     Tenderness: There is no abdominal tenderness.  Musculoskeletal:        General: Normal range of motion.     Cervical back: Normal range of motion and neck supple.  Lymphadenopathy:     Cervical: No cervical adenopathy.  Skin:    General: Skin is warm and dry.  Neurological:     Mental Status: She is alert and oriented to person, place, and time.     Deep Tendon Reflexes: Reflexes are normal and symmetric.  Psychiatric:        Behavior: Behavior normal.        Thought Content: Thought content normal.        Judgment: Judgment normal.    BP 115/81   Pulse (!) 101   Temp (!) 97.2 F (36.2 C) (Temporal)   Ht 5\' 1"  (1.549 m)   Wt 110 lb (49.9 kg)   SpO2 100%   BMI 20.78 kg/m          Assessment & Plan:   Erica Rodriguez comes in today with chief complaint of No chief complaint on file.   Diagnosis and orders addressed:  1. Chronic migraine without aura, with intractable migraine, so stated, with status migrainosus Keep follow up with neurology  2. GAD (generalized anxiety disorder) Stress management - buPROPion (WELLBUTRIN XL) 300 MG 24 hr tablet; Take 1 tablet (300 mg total) by mouth daily.  Dispense: 90 tablet; Refill: 1  3. Primary insomnia Bedtime routine - zolpidem (AMBIEN) 10 MG tablet; Take 1 tablet (10 mg total) by mouth at bedtime as needed for sleep.  Dispense: 30 tablet; Refill: 5   Labs pending Health Maintenance reviewed Diet and exercise encouraged  Follow up plan: 6 months   [name], FNP

## 2023-06-26 NOTE — Patient Instructions (Signed)
Insomnia Insomnia is a sleep disorder that makes it difficult to fall asleep or stay asleep. Insomnia can cause fatigue, low energy, difficulty concentrating, mood swings, and poor performance at work or school. There are three different ways to classify insomnia: Difficulty falling asleep. Difficulty staying asleep. Waking up too early in the morning. Any type of insomnia can be long-term (chronic) or short-term (acute). Both are common. Short-term insomnia usually lasts for 3 months or less. Chronic insomnia occurs at least three times a week for longer than 3 months. What are the causes? Insomnia may be caused by another condition, situation, or substance, such as: Having certain mental health conditions, such as anxiety and depression. Using caffeine, alcohol, tobacco, or drugs. Having gastrointestinal conditions, such as gastroesophageal reflux disease (GERD). Having certain medical conditions. These include: Asthma. Alzheimer's disease. Stroke. Chronic pain. An overactive thyroid gland (hyperthyroidism). Other sleep disorders, such as restless legs syndrome and sleep apnea. Menopause. Sometimes, the cause of insomnia may not be known. What increases the risk? Risk factors for insomnia include: Gender. Females are affected more often than males. Age. Insomnia is more common as people get older. Stress and certain medical and mental health conditions. Lack of exercise. Having an irregular work schedule. This may include working night shifts and traveling between different time zones. What are the signs or symptoms? If you have insomnia, the main symptom is having trouble falling asleep or having trouble staying asleep. This may lead to other symptoms, such as: Feeling tired or having low energy. Feeling nervous about going to sleep. Not feeling rested in the morning. Having trouble concentrating. Feeling irritable, anxious, or depressed. How is this diagnosed? This condition  may be diagnosed based on: Your symptoms and medical history. Your health care provider may ask about: Your sleep habits. Any medical conditions you have. Your mental health. A physical exam. How is this treated? Treatment for insomnia depends on the cause. Treatment may focus on treating an underlying condition that is causing the insomnia. Treatment may also include: Medicines to help you sleep. Counseling or therapy. Lifestyle adjustments to help you sleep better. Follow these instructions at home: Eating and drinking  Limit or avoid alcohol, caffeinated beverages, and products that contain nicotine and tobacco, especially close to bedtime. These can disrupt your sleep. Do not eat a large meal or eat spicy foods right before bedtime. This can lead to digestive discomfort that can make it hard for you to sleep. Sleep habits  Keep a sleep diary to help you and your health care provider figure out what could be causing your insomnia. Write down: When you sleep. When you wake up during the night. How well you sleep and how rested you feel the next day. Any side effects of medicines you are taking. What you eat and drink. Make your bedroom a dark, comfortable place where it is easy to fall asleep. Put up shades or blackout curtains to block light from outside. Use a white noise machine to block noise. Keep the temperature cool. Limit screen use before bedtime. This includes: Not watching TV. Not using your smartphone, tablet, or computer. Stick to a routine that includes going to bed and waking up at the same times every day and night. This can help you fall asleep faster. Consider making a quiet activity, such as reading, part of your nighttime routine. Try to avoid taking naps during the day so that you sleep better at night. Get out of bed if you are still awake after   15 minutes of trying to sleep. Keep the lights down, but try reading or doing a quiet activity. When you feel  sleepy, go back to bed. General instructions Take over-the-counter and prescription medicines only as told by your health care provider. Exercise regularly as told by your health care provider. However, avoid exercising in the hours right before bedtime. Use relaxation techniques to manage stress. Ask your health care provider to suggest some techniques that may work well for you. These may include: Breathing exercises. Routines to release muscle tension. Visualizing peaceful scenes. Make sure that you drive carefully. Do not drive if you feel very sleepy. Keep all follow-up visits. This is important. Contact a health care provider if: You are tired throughout the day. You have trouble in your daily routine due to sleepiness. You continue to have sleep problems, or your sleep problems get worse. Get help right away if: You have thoughts about hurting yourself or someone else. Get help right away if you feel like you may hurt yourself or others, or have thoughts about taking your own life. Go to your nearest emergency room or: Call 911. Call the National Suicide Prevention Lifeline at 1-800-273-8255 or 988. This is open 24 hours a day. Text the Crisis Text Line at 741741. Summary Insomnia is a sleep disorder that makes it difficult to fall asleep or stay asleep. Insomnia can be long-term (chronic) or short-term (acute). Treatment for insomnia depends on the cause. Treatment may focus on treating an underlying condition that is causing the insomnia. Keep a sleep diary to help you and your health care provider figure out what could be causing your insomnia. This information is not intended to replace advice given to you by your health care provider. Make sure you discuss any questions you have with your health care provider. Document Revised: 06/03/2021 Document Reviewed: 06/03/2021 Elsevier Patient Education  2024 [employer]  

## 2023-06-27 ENCOUNTER — Other Ambulatory Visit (HOSPITAL_COMMUNITY): Payer: Self-pay

## 2023-06-29 ENCOUNTER — Other Ambulatory Visit: Payer: Self-pay

## 2023-06-29 ENCOUNTER — Other Ambulatory Visit (HOSPITAL_COMMUNITY): Payer: Self-pay

## 2023-06-29 DIAGNOSIS — M5412 Radiculopathy, cervical region: Secondary | ICD-10-CM | POA: Diagnosis not present

## 2023-06-29 MED ORDER — GABAPENTIN 600 MG PO TABS
600.0000 mg | ORAL_TABLET | Freq: Four times a day (QID) | ORAL | 3 refills | Status: DC
  Filled 2023-06-29: qty 120, 30d supply, fill #0
  Filled 2023-08-15: qty 120, 30d supply, fill #1
  Filled 2023-09-14: qty 120, 30d supply, fill #2
  Filled 2023-10-12: qty 120, 30d supply, fill #3

## 2023-06-30 ENCOUNTER — Other Ambulatory Visit: Payer: Self-pay

## 2023-07-06 ENCOUNTER — Other Ambulatory Visit: Payer: Self-pay

## 2023-07-10 ENCOUNTER — Telehealth: Payer: Commercial Managed Care - PPO | Admitting: Family Medicine

## 2023-07-10 DIAGNOSIS — B9689 Other specified bacterial agents as the cause of diseases classified elsewhere: Secondary | ICD-10-CM | POA: Diagnosis not present

## 2023-07-10 DIAGNOSIS — J028 Acute pharyngitis due to other specified organisms: Secondary | ICD-10-CM | POA: Diagnosis not present

## 2023-07-10 MED ORDER — PENICILLIN V POTASSIUM 500 MG PO TABS: 500.0000 mg | ORAL_TABLET | Freq: Three times a day (TID) | ORAL | 0 refills | Status: AC

## 2023-07-10 MED ORDER — FLUCONAZOLE 150 MG PO TABS: 150.0000 mg | ORAL_TABLET | ORAL | 0 refills | Status: DC

## 2023-07-10 MED ORDER — LIDOCAINE VISCOUS HCL 2 % MT SOLN: 15.0000 mL | OROMUCOSAL | 0 refills | Status: DC | PRN

## 2023-07-10 NOTE — Progress Notes (Signed)
 E-Visit for Sore Throat - Strep Symptoms  We are sorry that you are not feeling well.  Here is how we plan to help!  Based on what you have shared with me it is likely that you have strep pharyngitis.  Strep pharyngitis is inflammation and infection in the back of the throat.  This is an infection cause by bacteria and is treated with antibiotics.  I have prescribed Penicillin  V 500 mg twice a day for 10 days and 2% Viscous Lidocaine  5 ml gargle and swallow every 4 hours as needed for throat pain. For throat pain, we recommend over the counter oral pain relief medications such as acetaminophen  or aspirin, or anti-inflammatory medications such as ibuprofen  or naproxen sodium. Topical treatments such as oral throat lozenges or sprays may be used as needed. Strep infections are not as easily transmitted as other respiratory infections, however we still recommend that you avoid close contact with loved ones, especially the very young and elderly.  Remember to wash your hands thoroughly throughout the day as this is the number one way to prevent the spread of infection and wipe down door knobs and counters with disinfectant.   Home Care: Only take medications as instructed by your medical team. Complete the entire course of an antibiotic. Do not take these medications with alcohol. A steam or ultrasonic humidifier can help congestion.  You can place a towel over your head and breathe in the steam from hot water coming from a faucet. Avoid close contacts especially the very young and the elderly. Cover your mouth when you cough or sneeze. Always remember to wash your hands.  Get Help Right Away If: You develop worsening fever or sinus pain. You develop a severe head ache or visual changes. Your symptoms persist after you have completed your treatment plan.  Make sure you Understand these instructions. Will watch your condition. Will get help right away if you are not doing well or get  worse.   Thank you for choosing an e-visit.  Your e-visit answers were reviewed by a board certified advanced clinical practitioner to complete your personal care plan. Depending upon the condition, your plan could have included both over the counter or prescription medications.  Please review your pharmacy choice. Make sure the pharmacy is open so you can pick up prescription now. If there is a problem, you may contact your provider through [employer] and have the prescription routed to another pharmacy.  Your safety is important to us . If you have drug allergies check your prescription carefully.   For the next 24 hours you can use MyChart to ask questions about today's visit, request a non-urgent call back, or ask for a work or school excuse. You will get an email in the next two days asking about your experience. I hope that your e-visit has been valuable and will speed your recovery.  I provided 5 minutes of non face-to-face time during this encounter for chart review, medication and order placement, as well as and documentation.

## 2023-07-14 ENCOUNTER — Other Ambulatory Visit: Payer: Self-pay

## 2023-07-14 ENCOUNTER — Other Ambulatory Visit (HOSPITAL_COMMUNITY): Payer: Self-pay

## 2023-07-14 ENCOUNTER — Telehealth: Payer: Commercial Managed Care - PPO | Admitting: Nurse Practitioner

## 2023-07-14 DIAGNOSIS — J069 Acute upper respiratory infection, unspecified: Secondary | ICD-10-CM | POA: Diagnosis not present

## 2023-07-14 MED ORDER — CHLORPHEN-PE-ACETAMINOPHEN 4-10-325 MG PO TABS: 1.0000 | ORAL_TABLET | Freq: Four times a day (QID) | ORAL | 0 refills | Status: DC | PRN

## 2023-07-14 NOTE — Patient Instructions (Signed)
1. Take meds as prescribed 2. Use a cool mist humidifier especially during the winter months and when heat has been humid. 3. Use saline nose sprays frequently 4. Saline irrigations of the nose can be very helpful if done frequently.  * 4X daily for 1 week*  * Use of a nettie pot can be helpful with this. Follow directions with this* 5. Drink plenty of fluids 6. Keep thermostat turn down low 7.For any cough or congestion- norel ad 8. For fever or aces or pains- take tylenol or ibuprofen appropriate for age and weight.  * for fevers greater than 101 orally you may alternate ibuprofen and tylenol every  3 hours.    

## 2023-07-14 NOTE — Progress Notes (Signed)
 Virtual Visit Consent   Erica Rodriguez, you are scheduled for a virtual visit with [name] Gladis, FNP, a [facility] provider, today.     Just as with appointments in the office, your consent must be obtained to participate.  Your consent will be active for this visit and any virtual visit you may have with one of our providers in the next 365 days.     If you have a MyChart account, a copy of this consent can be sent to you electronically.  All virtual visits are billed to your insurance company just like a traditional visit in the office.    As this is a virtual visit, video technology does not allow for your provider to perform a traditional examination.  This may limit your provider's ability to fully assess your condition.  If your provider identifies any concerns that need to be evaluated in person or the need to arrange testing (such as labs, EKG, etc.), we will make arrangements to do so.     Although advances in technology are sophisticated, we cannot ensure that it will always work on either your end or our end.  If the connection with a video visit is poor, the visit may have to be switched to a telephone visit.  With either a video or telephone visit, we are not always able to ensure that we have a secure connection.     I need to obtain your verbal consent now.   Are you willing to proceed with your visit today? YES   NASHA DISS has provided verbal consent on 07/14/2023 for a virtual visit (video or telephone).   [name] Gladis, FNP   Date: 07/14/2023 10:57 AM   Virtual Visit via Video Note   I, [name] Gladis, connected with Erica Rodriguez ([id number], 11-26-1985) on 07/14/23 at 11:00 AM EST by a video-enabled telemedicine application and verified that I am speaking with the correct person using two identifiers.  Location: Patient: Virtual Visit Location Patient: Home Provider: Virtual Visit Location Provider: Mobile   I discussed the limitations of  evaluation and management by telemedicine and the availability of in person appointments. The patient expressed understanding and agreed to proceed.    History of Present Illness: Erica Rodriguez is a 38 y.o. who identifies as a female who was assigned female at birth, and is being seen today for uri.  HPI: Patient has been sick for about 1 week. She did evisit Saturday and was prescribed augmentin . Still not completely better. Has pressure behind ears and congestion.     Review of Systems  Constitutional:  Negative for chills and fever.  HENT:  Positive for congestion and ear pain. Negative for ear discharge, sinus pain and sore throat.   Respiratory:  Positive for cough.     Problems:  Patient Active Problem List   Diagnosis Date Noted   Dupuytren's contracture of right hand 07/29/2021   Plantar fasciitis 12/25/2020   Ulnar nerve compression, right 12/25/2020   Bilateral carpal tunnel syndrome 02/15/2020   Chronic migraine without aura, with intractable migraine, so stated, with status migrainosus 12/21/2018   Insomnia 10/07/2012   GAD (generalized anxiety disorder) 10/07/2012    Allergies: No Known Allergies Medications:  Current Outpatient Medications:    botulinum toxin Type A  (BOTOX ) 200 units injection, Provider to inject 155 units into the muscles of the head and neck every 12 weeks. Discard remainder., Disp: 1 each, Rfl: 3   buPROPion  (WELLBUTRIN  XL) 300 MG  24 hr tablet, Take 1 tablet (300 mg total) by mouth daily., Disp: 90 tablet, Rfl: 1   celecoxib  (CELEBREX ) 200 MG capsule, Take 1 capsule (200 mg total) by mouth once to twice a day with food for joint pain., Disp: 60 capsule, Rfl: 1   citalopram  (CELEXA ) 20 MG tablet, Take 1 tablet (20 mg total) by mouth daily., Disp: 30 tablet, Rfl: 5   cyanocobalamin  (VITAMIN B12) 1000 MCG/ML injection, Inject 1 mL (1,000 mcg total) into the muscle every 30 days, Disp: 3 mL, Rfl: 12   cyclobenzaprine  (FLEXERIL ) 5 MG tablet, Take 1 tablet  (5 mg total) by mouth 3 (three) times daily as needed for muscle spasms, Disp: 60 tablet, Rfl: 2   diazepam  (VALIUM ) 10 MG tablet, Take 1 tablet (10 mg total) by mouth every 12 (twelve) hours as needed for anxiety., Disp: 30 tablet, Rfl: 0   Erenumab -aooe 140 MG/ML SOAJ, Inject 140 mg into the skin every 30 (thirty) days., Disp: 3 mL, Rfl: 3   fluconazole  (DIFLUCAN ) 150 MG tablet, Take 1 tablet (150 mg total) by mouth as directed. Repeat in 3 days as needed, Disp: 2 tablet, Rfl: 0   gabapentin  (NEURONTIN ) 300 MG capsule, Take 2 capsules (600 mg total) by mouth 4 (four) times daily., Disp: 240 capsule, Rfl: 2   gabapentin  (NEURONTIN ) 600 MG tablet, Take 1 tablet (600 mg total) by mouth 4 (four) times daily., Disp: 120 tablet, Rfl: 3   HYDROcodone -acetaminophen  (NORCO/VICODIN) 5-325 MG tablet, Take 1 tablet by mouth 3 (three) times daily as needed., Disp: 45 tablet, Rfl: 0   ketorolac  (TORADOL ) 60 MG/2ML SOLN injection, Inject 2 mLs (60 mg total) into the muscle, at onset of migraine. May repeat in 6 hours. Use a max of 2 times daily and 4 days a month., Disp: 10 mL, Rfl: 0   lidocaine  (XYLOCAINE ) 2 % solution, Use as directed 15 mLs in the mouth or throat as needed for mouth pain., Disp: 100 mL, Rfl: 0   MIRENA , 52 MG, 20 MCG/24HR IUD, , Disp: , Rfl:    OnabotulinumtoxinA  (BOTOX  IM), Inject into the muscle. 155units IM to head and neck every 3 months for migraines., Disp: , Rfl:    ondansetron  (ZOFRAN -ODT) 4 MG disintegrating tablet, Dissolve 1-2 tablets (4-8 mg total) by mouth every 8 (eight) hours as needed for nausea or vomiting., Disp: 30 tablet, Rfl: 11   penicillin  v potassium (VEETID) 500 MG tablet, Take 1 tablet (500 mg total) by mouth 3 (three) times daily for 10 days., Disp: 30 tablet, Rfl: 0   propranolol  ER (INDERAL  LA) 80 MG 24 hr capsule, Take 1 capsule (80 mg total) by mouth daily., Disp: 90 capsule, Rfl: 3   topiramate  (TOPAMAX ) 100 MG tablet, Take 1 tablet (100 mg total) by mouth 2  (two) times daily., Disp: 180 tablet, Rfl: 3   Ubrogepant  (UBRELVY ) 100 MG TABS, Take 1 tablet by mouth daily as needed. Take one tablet at onset of headache, may repeat 1 tablet in 2 hours, no more than 2 tablets in 24 hours, Disp: 16 tablet, Rfl: 11   zolpidem  (AMBIEN ) 10 MG tablet, Take 1 tablet (10 mg total) by mouth at bedtime as needed for sleep., Disp: 30 tablet, Rfl: 5  Current Facility-Administered Medications:    cyanocobalamin  ((VITAMIN B-12)) injection 1,000 mcg, 1,000 mcg, Intramuscular, Q30 days, Gladis, [name], FNP, 1,000 mcg at 05/26/19 1035  Observations/Objective: Patient is well-developed, well-nourished in no acute distress.  Resting comfortably  at home.  Head  is normocephalic, atraumatic.  No labored breathing.  Speech is clear and coherent with logical content.  Patient is alert and oriented at baseline.  Raspy voice Np cough during visit  Assessment and Plan:  Erica Rodriguez in today with chief complaint of No chief complaint on file.   1. URI with cough and congestion (Primary) 1. Take meds as prescribed 2. Use a cool mist humidifier especially during the winter months and when heat has been humid. 3. Use saline nose sprays frequently 4. Saline irrigations of the nose can be very helpful if done frequently.  * 4X daily for 1 week*  * Use of a nettie pot can be helpful with this. Follow directions with this* 5. Drink plenty of fluids 6. Keep thermostat turn down low 7.For any cough or congestion- norel ad 8. For fever or aces or pains- take tylenol  or ibuprofen  appropriate for age and weight.  * for fevers greater than 101 orally you may alternate ibuprofen  and tylenol  every  3 hours.   Meds ordered this encounter  Medications   Chlorphen-PE-Acetaminophen  4-10-325 MG TABS    Sig: Take 1 tablet by mouth every 6 (six) hours as needed.    Dispense:  20 tablet    Refill:  0    Supervising Provider:   MARYANNE CHEW A [[id number]]       Follow Up  Instructions: I discussed the assessment and treatment plan with the patient. The patient was provided an opportunity to ask questions and all were answered. The patient agreed with the plan and demonstrated an understanding of the instructions.  A copy of instructions were sent to the patient via MyChart.  The patient was advised to call back or seek an in-person evaluation if the symptoms worsen or if the condition fails to improve as anticipated.  Time:  I spent 6 minutes with the patient via telehealth technology discussing the above problems/concerns.    [name] Gladis, FNP

## 2023-07-15 ENCOUNTER — Other Ambulatory Visit: Payer: Self-pay

## 2023-07-20 ENCOUNTER — Telehealth: Payer: Commercial Managed Care - PPO | Admitting: Physician Assistant

## 2023-07-20 DIAGNOSIS — N760 Acute vaginitis: Secondary | ICD-10-CM

## 2023-07-20 NOTE — Telephone Encounter (Signed)
 OTC decongestant- [probably vira and just has to run its course

## 2023-07-20 NOTE — Progress Notes (Signed)
 Because you have already taken two doses of diflucan  with no improvement, I feel your condition warrants further evaluation and I recommend that you be seen in a face to face visit.  Recommend labs to determine if a different course of treatment is necessary.    NOTE: There will be NO CHARGE for this E-Visit   If you are having a true medical emergency please call 911.     For an urgent face to face visit, [facility] has multiple urgent care centers for your convenience.   [facility] at [facility] Your location to [facility] Urgent Care at [facility] [phone] [address] [place], [place] [address]   Urgent Care at [facility] [place], [place]   Your location to [facility] Urgent Care at [facility] [phone] [address] [place],  [place]  [address]  [facility] Urgent Care at [facility] [place]) Get Driving Directions [phone] [address], [address] [place], [address]    [facility] Urgent Care Center at [facility] Get Driving Directions [phone] [address] [place], [place] [address]   [facility] Health Urgent [facility]) Get Driving Directions [phone] [address] [place], [place] [address]  [facility] Urgent Care Center [facility]) Get Driving Directions [phone] [address] [place],  [place]  [address]  [facility] Urgent Care Center [facility] - at [employer]  [phone] [address] [place],  [place] [address]   [facility] Urgent Care at [facility] Get Driving Directions [phone] [address] [place], [place] [address]   [facility] Urgent Care at [facility] Get Driving Directions  [phone] [address] [place], [place] [address]   [facility]  Health Urgent Care at [facility] Directions [phone] [address], [address] [place], [place] [address]  Your MyChart E-visit questionnaire answers were reviewed by a board certified advanced clinical practitioner to complete your personal care plan based on your specific symptoms.  Thank you for using e-Visits.

## 2023-07-21 ENCOUNTER — Other Ambulatory Visit: Payer: Self-pay

## 2023-07-21 ENCOUNTER — Other Ambulatory Visit: Payer: Self-pay | Admitting: Neurology

## 2023-07-21 DIAGNOSIS — G43711 Chronic migraine without aura, intractable, with status migrainosus: Secondary | ICD-10-CM

## 2023-07-21 MED ORDER — BOTOX 200 UNITS IJ SOLR
INTRAMUSCULAR | 3 refills | Status: DC
  Filled 2023-07-21: qty 1, 84d supply, fill #0
  Filled 2023-10-15: qty 1, 84d supply, fill #1
  Filled 2024-01-18: qty 1, 84d supply, fill #2
  Filled 2024-04-25: qty 1, 84d supply, fill #3

## 2023-07-21 NOTE — Progress Notes (Signed)
 Specialty Pharmacy Refill Coordination Note  Erica Rodriguez is a 38 y.o. female contacted today regarding refills of specialty medication(s) OnabotulinumtoxinA  (BOTOX )   Patient requested Courier to Provider Office   Delivery date: 07/29/23   Verified address: [facility] [address]   [place] [address]   Medication will be filled on 07/28/23.   Pending refill request

## 2023-07-23 ENCOUNTER — Other Ambulatory Visit: Payer: Self-pay

## 2023-07-23 ENCOUNTER — Other Ambulatory Visit (HOSPITAL_COMMUNITY): Payer: Self-pay

## 2023-07-23 MED ORDER — HYDROCODONE-ACETAMINOPHEN 5-325 MG PO TABS
1.0000 | ORAL_TABLET | Freq: Three times a day (TID) | ORAL | 0 refills | Status: DC | PRN
  Filled 2023-07-23 – 2023-07-30 (×2): qty 45, 15d supply, fill #0

## 2023-07-24 ENCOUNTER — Other Ambulatory Visit: Payer: Self-pay

## 2023-07-27 ENCOUNTER — Other Ambulatory Visit: Payer: Self-pay | Admitting: Neurology

## 2023-07-27 DIAGNOSIS — M62838 Other muscle spasm: Secondary | ICD-10-CM

## 2023-07-28 ENCOUNTER — Encounter: Payer: Self-pay | Admitting: Neurology

## 2023-07-28 ENCOUNTER — Other Ambulatory Visit (HOSPITAL_COMMUNITY): Payer: Self-pay

## 2023-07-28 ENCOUNTER — Other Ambulatory Visit: Payer: Self-pay

## 2023-07-28 MED ORDER — CYCLOBENZAPRINE HCL 5 MG PO TABS
5.0000 mg | ORAL_TABLET | Freq: Three times a day (TID) | ORAL | 0 refills | Status: DC | PRN
  Filled 2023-07-28: qty 60, 20d supply, fill #0

## 2023-07-28 NOTE — Telephone Encounter (Signed)
Last rx was approved for patient. No supporting documentation for refills. Please review.

## 2023-07-29 ENCOUNTER — Encounter (HOSPITAL_COMMUNITY): Payer: Self-pay

## 2023-07-30 ENCOUNTER — Other Ambulatory Visit (HOSPITAL_COMMUNITY): Payer: Self-pay

## 2023-08-05 ENCOUNTER — Ambulatory Visit: Payer: Commercial Managed Care - PPO | Admitting: Adult Health

## 2023-08-05 ENCOUNTER — Other Ambulatory Visit: Payer: Self-pay | Admitting: Neurology

## 2023-08-05 DIAGNOSIS — G43709 Chronic migraine without aura, not intractable, without status migrainosus: Secondary | ICD-10-CM

## 2023-08-06 ENCOUNTER — Other Ambulatory Visit: Payer: Self-pay

## 2023-08-06 ENCOUNTER — Ambulatory Visit: Payer: Commercial Managed Care - PPO | Admitting: Adult Health

## 2023-08-06 ENCOUNTER — Other Ambulatory Visit (HOSPITAL_COMMUNITY): Payer: Self-pay

## 2023-08-06 ENCOUNTER — Encounter: Payer: Self-pay | Admitting: *Deleted

## 2023-08-06 ENCOUNTER — Encounter: Payer: Self-pay | Admitting: Pharmacist

## 2023-08-07 ENCOUNTER — Other Ambulatory Visit: Payer: Self-pay

## 2023-08-07 ENCOUNTER — Other Ambulatory Visit (HOSPITAL_COMMUNITY): Payer: Self-pay

## 2023-08-07 MED ORDER — TOPIRAMATE 100 MG PO TABS
100.0000 mg | ORAL_TABLET | Freq: Two times a day (BID) | ORAL | 0 refills | Status: DC
  Filled 2023-08-07: qty 180, 90d supply, fill #0

## 2023-08-08 DIAGNOSIS — M5416 Radiculopathy, lumbar region: Secondary | ICD-10-CM | POA: Diagnosis not present

## 2023-08-10 ENCOUNTER — Encounter: Payer: Self-pay | Admitting: Adult Health

## 2023-08-10 ENCOUNTER — Ambulatory Visit: Payer: Commercial Managed Care - PPO | Admitting: Adult Health

## 2023-08-10 ENCOUNTER — Other Ambulatory Visit: Payer: Self-pay

## 2023-08-10 ENCOUNTER — Other Ambulatory Visit (HOSPITAL_COMMUNITY): Payer: Self-pay

## 2023-08-10 DIAGNOSIS — G43709 Chronic migraine without aura, not intractable, without status migrainosus: Secondary | ICD-10-CM | POA: Diagnosis not present

## 2023-08-10 MED ORDER — ONABOTULINUMTOXINA 200 UNITS IJ SOLR
155.0000 [IU] | Freq: Once | INTRAMUSCULAR | Status: AC
  Administered 2023-08-10: 155 [IU] via INTRAMUSCULAR

## 2023-08-10 NOTE — Progress Notes (Signed)
    Botox- 200 units x 1 vial Lot: [id number] Expiration: 09/2025 NDC: 4098-1191-47   Bacteriostatic 0.9% Sodium Chloride- 4mL total [id number] Expiration: 05/07/2024 NDC: 8657-8469-62   Dx: X52.841 Witnessed by Leeann Must RN

## 2023-08-10 NOTE — Progress Notes (Signed)
08/10/23: Patient states that her headaches have increased in the last 2 weeks.  She states that she can tell the Botox has started to wear off.  She also notes that she usually gets ptosis of the right eye after injections.  Although it was not as bad as the first time she had injections.  She remains on Topamax, propranolol and Aimovig for prevention.  Has Ubrelvy, Toradol and Flexeril to use as needed.  This time she would like to avoid injecting around the eyes due to ptosis and asymmetry.  I did advise that if she feels that her migraines are worse this next around we can resume injecting at the next visit.  We did inject the frontalis however I went higher in the hairline.  04/28/2023: Patient feels her jaw pain exacerbates her migraines so placed 5U each masseter as well as 5U each at the line from the orb oculi to the temporalis muscle area where the pain presents   02/03/23: Stable- gets migraines when she is due for botox. Uses ubrelvy and ibuprofen when she does get a migraine. Reports that the last two injections with Dr. Lucia Gaskins she had ptosis in the right eye. I injected higher in the forehead. Also offered to not injected the frontalis or corrugators but she wanted the injections.      BOTOX PROCEDURE NOTE FOR MIGRAINE HEADACHE    Contraindications and precautions discussed with patient(above). Aseptic procedure was observed and patient tolerated procedure. Procedure performed by Butch Penny, NP  The condition has existed for more than 6 months, and pt does not have a diagnosis of ALS, Myasthenia Gravis or Lambert-Eaton Syndrome.  Risks and benefits of injections discussed and pt agrees to proceed with the procedure.  Written consent obtained  These injections are medically necessary. He receives good benefits from these injections. These injections do not cause sedations or hallucinations which the oral therapies may cause.  Indication/Diagnosis: chronic  migraine BOTOX(J0585) injection was performed according to protocol by [employer]. 200 units of BOTOX was dissolved into 4 cc NS.   NDC: [address]-40  Type of toxin: Botox  Botox- 200 units x 1 vial Lot: [id number] Expiration: 09/2025 NDC: 9811-9147-82   Bacteriostatic 0.9% Sodium Chloride- 4mL total [id number] Expiration: 05/07/2024 NDC: 5784-6962-95   Dx: M84.132  Description of procedure:  The patient was placed in a sitting position. The standard protocol was used for Botox as follows, with 5 units of Botox injected at each site:   -Procerus muscle, deferred  -Corrugator muscle, deferred  -Frontalis muscle, bilateral injection, with 2 sites each side, medial injection was performed in the upper one third of the frontalis muscle, in the region vertical from the medial inferior edge of the superior orbital rim. The lateral injection was again in the upper one third of the forehead vertically above the lateral limbus of the cornea, 1.5 cm lateral to the medial injection site.  -Temporalis muscle injection, 4 sites, bilaterally. The first injection was 3 cm above the tragus of the ear, second injection site was 1.5 cm to 3 cm up from the first injection site in line with the tragus of the ear. The third injection site was 1.5-3 cm forward between the first 2 injection sites. The fourth injection site was 1.5 cm posterior to the second injection site.  -Occipitalis muscle injection, 3 sites, bilaterally. The first injection was done one half way between the occipital protuberance and the tip of the mastoid process behind the ear. The  second injection site was done lateral and superior to the first, 1 fingerbreadth from the first injection. The third injection site was 1 fingerbreadth superiorly and medially from the first injection site.  -Cervical paraspinal muscle injection, 2 sites, bilateral knee first injection site was 1 cm from the midline of the cervical spine, 3 cm inferior to the  lower border of the occipital protuberance. The second injection site was 1.5 cm superiorly and laterally to the first injection site.  -Trapezius muscle injection was performed at 3 sites, bilaterally. The first injection site was in the upper trapezius muscle halfway between the inflection point of the neck, and the acromion. The second injection site was one half way between the acromion and the first injection site. The third injection was done between the first injection site and the inflection point of the neck.   Will return for repeat injection in 3 months.   A 200 unit sof Botox was used, 140 units were injected, the rest of the Botox was wasted. The patient tolerated the procedure well, there were no complications of the above procedure.  Butch Penny, MSN, NP-C 08/10/2023, 8:24 AM [facility] [address] [place], [place] [address] [phone]

## 2023-08-16 ENCOUNTER — Other Ambulatory Visit: Payer: Self-pay

## 2023-08-17 ENCOUNTER — Other Ambulatory Visit: Payer: Self-pay

## 2023-08-17 ENCOUNTER — Other Ambulatory Visit (HOSPITAL_COMMUNITY): Payer: Self-pay

## 2023-08-18 ENCOUNTER — Other Ambulatory Visit: Payer: Self-pay

## 2023-08-18 ENCOUNTER — Other Ambulatory Visit (HOSPITAL_COMMUNITY): Payer: Self-pay

## 2023-08-25 DIAGNOSIS — M5412 Radiculopathy, cervical region: Secondary | ICD-10-CM | POA: Diagnosis not present

## 2023-09-04 ENCOUNTER — Other Ambulatory Visit (HOSPITAL_COMMUNITY): Payer: Self-pay

## 2023-09-04 ENCOUNTER — Other Ambulatory Visit: Payer: Self-pay

## 2023-09-04 DIAGNOSIS — Z6820 Body mass index (BMI) 20.0-20.9, adult: Secondary | ICD-10-CM | POA: Diagnosis not present

## 2023-09-04 DIAGNOSIS — Z01419 Encounter for gynecological examination (general) (routine) without abnormal findings: Secondary | ICD-10-CM | POA: Diagnosis not present

## 2023-09-04 DIAGNOSIS — Z1151 Encounter for screening for human papillomavirus (HPV): Secondary | ICD-10-CM | POA: Diagnosis not present

## 2023-09-07 ENCOUNTER — Other Ambulatory Visit (HOSPITAL_COMMUNITY): Payer: Self-pay

## 2023-09-08 ENCOUNTER — Other Ambulatory Visit: Payer: Self-pay | Admitting: Nurse Practitioner

## 2023-09-08 ENCOUNTER — Other Ambulatory Visit: Payer: Self-pay

## 2023-09-08 ENCOUNTER — Other Ambulatory Visit (HOSPITAL_COMMUNITY): Payer: Self-pay

## 2023-09-08 DIAGNOSIS — E538 Deficiency of other specified B group vitamins: Secondary | ICD-10-CM

## 2023-09-08 MED ORDER — HYDROCODONE-ACETAMINOPHEN 5-325 MG PO TABS
1.0000 | ORAL_TABLET | Freq: Three times a day (TID) | ORAL | 0 refills | Status: DC | PRN
  Filled 2023-09-08 – 2023-09-16 (×4): qty 45, 15d supply, fill #0

## 2023-09-08 MED ORDER — CYANOCOBALAMIN 1000 MCG/ML IJ SOLN
1000.0000 ug | INTRAMUSCULAR | 1 refills | Status: DC
  Filled 2023-09-08: qty 3, 90d supply, fill #0
  Filled 2023-11-27: qty 3, 90d supply, fill #1

## 2023-09-09 ENCOUNTER — Other Ambulatory Visit (HOSPITAL_COMMUNITY): Payer: Self-pay

## 2023-09-09 ENCOUNTER — Other Ambulatory Visit: Payer: Self-pay | Admitting: Neurology

## 2023-09-09 ENCOUNTER — Telehealth: Payer: Self-pay | Admitting: Pharmacy Technician

## 2023-09-09 DIAGNOSIS — M62838 Other muscle spasm: Secondary | ICD-10-CM

## 2023-09-09 NOTE — Telephone Encounter (Signed)
 Pharmacy Patient Advocate Encounter   Received notification from CoverMyMeds that prior authorization for Ubrelvy 100MG  tablets is required/requested.   Insurance verification completed.   The patient is insured through [facility] .   Per test claim: PA required; PA submitted to above mentioned insurance via CoverMyMeds Key/confirmation #/EOC [id number] Status is pending

## 2023-09-10 ENCOUNTER — Other Ambulatory Visit: Payer: Self-pay

## 2023-09-10 MED ORDER — CYCLOBENZAPRINE HCL 5 MG PO TABS
5.0000 mg | ORAL_TABLET | Freq: Three times a day (TID) | ORAL | 1 refills | Status: DC | PRN
  Filled 2023-09-10: qty 90, 30d supply, fill #0
  Filled 2023-10-12: qty 90, 30d supply, fill #1

## 2023-09-10 NOTE — Telephone Encounter (Signed)
 Pharmacy Patient Advocate Encounter  Received notification from [facility] that Prior Authorization for Ubrelvy 100MG  tablets  has been APPROVED from 09/09/2023 to 09/08/2024   PA #/Case ID/Reference #: [id number]

## 2023-09-15 ENCOUNTER — Other Ambulatory Visit (HOSPITAL_COMMUNITY): Payer: Self-pay

## 2023-09-16 ENCOUNTER — Other Ambulatory Visit (HOSPITAL_COMMUNITY): Payer: Self-pay

## 2023-09-16 ENCOUNTER — Encounter: Payer: Self-pay | Admitting: Neurology

## 2023-09-25 ENCOUNTER — Other Ambulatory Visit: Payer: Self-pay

## 2023-09-28 ENCOUNTER — Other Ambulatory Visit (HOSPITAL_COMMUNITY): Payer: Self-pay

## 2023-09-28 ENCOUNTER — Telehealth: Payer: Self-pay

## 2023-09-28 NOTE — Telephone Encounter (Signed)
 Pharmacy Patient Advocate Encounter   Received notification from CoverMyMeds that prior authorization for Ubrelvy 100MG  tablets is required/requested.   Insurance verification completed.   The patient is insured through [facility] .   Per test claim: Refill too soon. PA is not needed at this time. Medication was filled 09/25/2023. Next eligible fill date is 10/19/2023.

## 2023-09-30 ENCOUNTER — Encounter: Payer: Self-pay | Admitting: Neurology

## 2023-09-30 ENCOUNTER — Ambulatory Visit (INDEPENDENT_AMBULATORY_CARE_PROVIDER_SITE_OTHER): Admitting: Neurology

## 2023-09-30 VITALS — BP 120/80 | HR 89 | Ht 61.0 in | Wt 111.0 lb

## 2023-09-30 DIAGNOSIS — G43711 Chronic migraine without aura, intractable, with status migrainosus: Secondary | ICD-10-CM

## 2023-09-30 DIAGNOSIS — M62838 Other muscle spasm: Secondary | ICD-10-CM | POA: Diagnosis not present

## 2023-09-30 DIAGNOSIS — G43909 Migraine, unspecified, not intractable, without status migrainosus: Secondary | ICD-10-CM | POA: Diagnosis not present

## 2023-09-30 DIAGNOSIS — G43709 Chronic migraine without aura, not intractable, without status migrainosus: Secondary | ICD-10-CM

## 2023-09-30 DIAGNOSIS — M5481 Occipital neuralgia: Secondary | ICD-10-CM

## 2023-09-30 DIAGNOSIS — R110 Nausea: Secondary | ICD-10-CM

## 2023-09-30 DIAGNOSIS — H02401 Unspecified ptosis of right eyelid: Secondary | ICD-10-CM | POA: Diagnosis not present

## 2023-09-30 NOTE — Patient Instructions (Signed)
 Options discussed:  - Consider Occipital nerve blocks -Trigger point injections/dry needling - PT  -c2/c3 medial branch blocks -. Gamma knife or radiofequency Ablation - these are invasive procedures unlike nerve blocks which are more superficial        Occipital neuralgia  Occipital Neuralgia  Occipital neuralgia is a type of headache that causes brief episodes of very bad pain in the back of the head. Pain from occipital neuralgia may spread (radiate) to other parts of the head. These headaches may be caused by irritation of the nerves that leave the spinal cord high up in the neck, just below the base of the skull (occipital nerves). The occipital nerves transmit sensations from the back of the head, the top of the head, and the areas behind the ears. What are the causes? This condition can occur without any known cause (primary headache syndrome). In other cases, this condition is caused by pressure on or irritation of one of the two occipital nerves. Pressure and irritation may be due to: Muscle spasm in the neck. Neck injury. Wear and tear of the vertebrae in the neck (osteoarthritis). Disease of the disks that separate the vertebrae. Swollen blood vessels that put pressure on the occipital nerves. Infections. Tumors. Diabetes. What are the signs or symptoms? This condition causes brief burning, stabbing, electric, shocking, or shooting pain in the back of the head that can radiate to the top of the head. It can happen on one side or both sides of the head. It can also cause: Pain behind the eye. Pain triggered by neck movement or hair brushing. Scalp tenderness. Aching in the back of the head between episodes of very bad pain. Pain that gets worse with exposure to bright lights. How is this diagnosed? Your health care provider may diagnose the condition based on a physical exam and your symptoms. Tests may be done, such as: Imaging studies of the brain and neck (cervical  spine), such as an MRI or CT scan. These look for causes of pinched nerves. Applying pressure to the nerves in the neck to try to re-create the pain. Injection of numbing medicine into the occipital nerve areas to see if pain goes away (diagnostic nerve block). How is this treated? Treatment for this condition may begin with simple measures, such as: Rest. Massage. Applying heat or cold to the area. Over-the-counter pain relievers. If these measures do not work, you may need other treatments, including: Medicines, such as: Prescription-strength anti-inflammatory medicines. Muscle relaxants. Anti-seizure medicines, which can relieve pain. Antidepressants, which can relieve pain. Injected medicines, such as medicines that numb the area (local anesthetic) and steroids. Pulsed radiofrequency ablation. This is when wires are implanted to deliver electrical impulses that block pain signals from the occipital nerve. Surgery to relieve nerve pressure. Physical therapy. Follow these instructions at home: Managing pain     Avoid any activities that cause pain. Rest when you have an attack of pain. Try gentle massage to relieve pain. Try a different pillow or sleeping position. If directed, apply heat to the affected area as often as told by your health care provider. Use the heat source that your health care provider recommends, such as a moist heat pack or a heating pad. Place a towel between your skin and the heat source. Leave the heat on for 20-30 minutes. Remove the heat if your skin turns bright red. This is especially important if you are unable to feel pain, heat, or cold. You have a greater risk of  getting burned. If directed, put ice on the back of your head and neck area. To do this: Put ice in a plastic bag. Place a towel between your skin and the bag. Leave the ice on for 20 minutes, 2-3 times a day. Remove the ice if your skin turns bright red. This is very important. If you  cannot feel pain, heat, or cold, you have a greater risk of damage to the area. General instructions Take over-the-counter and prescription medicines only as told by your health care provider. Avoid things that make your symptoms worse, such as bright lights. Try to stay active. Get regular exercise that does not cause pain. Ask your health care provider to suggest safe exercises for you. Work with a physical therapist to learn stretching exercises you can do at home. Practice good posture. Keep all follow-up visits. This is important. Contact a health care provider if: Your medicine is not working. You have new or worsening symptoms. Get help right away if: You have very bad head pain that does not go away. You have a sudden change in vision, balance, or speech. These symptoms may represent a serious problem that is an emergency. Do not wait to see if the symptoms will go away. Get medical help right away. Call your local emergency services (911 in the [place] Do not drive yourself to the hospital. Summary Occipital neuralgia is a type of headache that causes brief episodes of very bad pain in the back of the head. Pain from occipital neuralgia may spread (radiate) to other parts of the head. Treatment for this condition includes rest, massage, and medicines. This information is not intended to replace advice given to you by your health care provider. Make sure you discuss any questions you have with your health care provider. Document Revised: 04/22/2020 Document Reviewed: 04/22/2020 Elsevier Patient Education  2023 [employer]  Gabapentin Capsules or Tablets What is this medication? GABAPENTIN (GA ba pen tin) treats nerve pain. It may also be used to prevent and control seizures in people with epilepsy. It works by calming overactive nerves in your body. This medicine may be used for other purposes; ask your health care provider or pharmacist if you have questions. COMMON BRAND  NAME(S): Active-PAC with Gabapentin, Ascencion Dike, Gralise, Neurontin What should I tell my care team before I take this medication? They need to know if you have any of these conditions: Alcohol or substance use disorder Kidney disease Lung or breathing disease Suicidal thoughts, plans, or attempt; a previous suicide attempt by you or a family member An unusual or allergic reaction to gabapentin, other medications, foods, dyes, or preservatives Pregnant or trying to get pregnant Breast-feeding How should I use this medication? Take this medication by mouth with a glass of water. Follow the directions on the prescription label. You can take it with or without food. If it upsets your stomach, take it with food. Take your medication at regular intervals. Do not take it more often than directed. Do not stop taking except on your care team's advice. If you are directed to break the 600 or 800 mg tablets in half as part of your dose, the extra half tablet should be used for the next dose. If you have not used the extra half tablet within 28 days, it should be thrown away. A special MedGuide will be given to you by the pharmacist with each prescription and refill. Be sure to read this information carefully each time. Talk to your care team  about the use of this medication in children. While this medication may be prescribed for children as young as 3 years for selected conditions, precautions do apply. Overdosage: If you think you have taken too much of this medicine contact a poison control center or emergency room at once. NOTE: This medicine is only for you. Do not share this medicine with others. What if I miss a dose? If you miss a dose, take it as soon as you can. If it is almost time for your next dose, take only that dose. Do not take double or extra doses. What may interact with this medication? Alcohol Antihistamines for allergy, cough, and cold Certain medications for anxiety or sleep Certain  medications for depression like amitriptyline, fluoxetine, sertraline Certain medications for seizures like phenobarbital, primidone Certain medications for stomach problems General anesthetics like halothane, isoflurane, methoxyflurane, propofol Local anesthetics like lidocaine, pramoxine, tetracaine Medications that relax muscles for surgery Opioid medications for pain Phenothiazines like chlorpromazine, mesoridazine, prochlorperazine, thioridazine This list may not describe all possible interactions. Give your health care provider a list of all the medicines, herbs, non-prescription drugs, or dietary supplements you use. Also tell them if you smoke, drink alcohol, or use illegal drugs. Some items may interact with your medicine. What should I watch for while using this medication? Visit your care team for regular checks on your progress. You may want to keep a record at home of how you feel your condition is responding to treatment. You may want to share this information with your care team at each visit. You should contact your care team if your seizures get worse or if you have any new types of seizures. Do not stop taking this medication or any of your seizure medications unless instructed by your care team. Stopping your medication suddenly can increase your seizures or their severity. This medication may cause serious skin reactions. They can happen weeks to months after starting the medication. Contact your care team right away if you notice fevers or flu-like symptoms with a rash. The rash may be red or purple and then turn into blisters or peeling of the skin. Or, you might notice a red rash with swelling of the face, lips or lymph nodes in your neck or under your arms. Wear a medical identification bracelet or chain if you are taking this medication for seizures. Carry a card that lists all your medications. This medication may affect your coordination, reaction time, or judgment. Do not  drive or operate machinery until you know how this medication affects you. Sit up or stand slowly to reduce the risk of dizzy or fainting spells. Drinking alcohol with this medication can increase the risk of these side effects. Your mouth may get dry. Chewing sugarless gum or sucking hard candy, and drinking plenty of water may help. Watch for new or worsening thoughts of suicide or depression. This includes sudden changes in mood, behaviors, or thoughts. These changes can happen at any time but are more common in the beginning of treatment or after a change in dose. Call your care team right away if you experience these thoughts or worsening depression. If you become pregnant while using this medication, you may enroll in the [place] [employer] Antiepileptic Drug Pregnancy Registry by calling [phone]. This registry collects information about the safety of antiepileptic medication use during pregnancy. What side effects may I notice from receiving this medication? Side effects that you should report to your care team as soon as possible: Allergic reactions  or angioedema--skin rash, itching, hives, swelling of the face, eyes, lips, tongue, arms, or legs, trouble swallowing or breathing Rash, fever, and swollen lymph nodes Thoughts of suicide or self harm, worsening mood, feelings of depression Trouble breathing Unusual changes in mood or behavior in children after use such as difficulty concentrating, hostility, or restlessness Side effects that usually do not require medical attention (report to your care team if they continue or are bothersome): Dizziness Drowsiness Nausea Swelling of ankles, feet, or hands Vomiting This list may not describe all possible side effects. Call your doctor for medical advice about side effects. You may report side effects to FDA at 1-800-FDA-1088. Where should I keep my medication? Keep out of reach of children and pets. Store at room temperature between 15 and  30 degrees C (59 and 86 degrees F). Get rid of any unused medication after the expiration date. This medication may cause accidental overdose and death if taken by other adults, children, or pets. To get rid of medications that are no longer needed or have expired: Take the medication to a medication take-back program. Check with your pharmacy or law enforcement to find a location. If you cannot return the medication, check the label or package insert to see if the medication should be thrown out in the garbage or flushed down the toilet. If you are not sure, ask your care team. If it is safe to put it in the trash, empty the medication out of the container. Mix the medication with cat litter, dirt, coffee grounds, or other unwanted substance. Seal the mixture in a bag or container. Put it in the trash. NOTE: This sheet is a summary. It may not cover all possible information. If you have questions about this medicine, talk to your doctor, pharmacist, or health care provider.  2023 Elsevier/Gold Standard (2020-12-24 00:00:00) Methylprednisolone Tablets What is this medication? METHYLPREDNISOLONE (meth ill pred NISS oh lone) treats many conditions such as asthma, allergic reactions, arthritis, inflammatory bowel diseases, adrenal, and blood or bone marrow disorders. It works by decreasing inflammation, slowing down an overactive immune system, or replacing cortisol normally made in the body. Cortisol is a hormone that plays an important role in how the body responds to stress, illness, and injury. It belongs to a group of medications called steroids. This medicine may be used for other purposes; ask your health care provider or pharmacist if you have questions. COMMON BRAND NAME(S): Medrol, Medrol Dosepak What should I tell my care team before I take this medication? They need to know if you have any of these conditions: Cushing's syndrome Eye disease, vision problems Diabetes Glaucoma Heart  disease High blood pressure Infection especially a viral infection, such as chickenpox, cold sores, or herpes Liver disease Mental health conditions Myasthenia gravis Osteoporosis Recent or upcoming vaccine Seizures Stomach or intestine problems Thyroid disease An unusual or allergic reaction to lactose, methylprednisolone, other medications, foods, dyes, or preservatives Pregnant or trying to get pregnant Breastfeeding How should I use this medication? Take this medication by mouth with a glass of water. Follow the directions on the prescription label. Take this medication with food. If you are taking this medication once a day, take it in the morning. Do not take it more often than directed. Do not suddenly stop taking your medication because you may develop a severe reaction. Your care team will tell you how much medication to take. If your care team wants you to stop the medication, the dose may be slowly lowered  over time to avoid any side effects. Talk to your care team about the use of this medication in children. Special care may be needed. Overdosage: If you think you have taken too much of this medicine contact a poison control center or emergency room at once. NOTE: This medicine is only for you. Do not share this medicine with others. What if I miss a dose? If you miss a dose, take it as soon as you can. If it is almost time for your next dose, talk to your care team. You may need to miss a dose or take an extra dose. Do not take double or extra doses without advice. What may interact with this medication? Do not take this medication with any of the following: Alefacept Echinacea Live virus vaccines Metyrapone Mifepristone This medication may also interact with the following: Amphotericin B Aspirin and aspirin-like medications Certain antibiotics, such as erythromycin, clarithromycin, troleandomycin Certain medications for diabetes Certain medications for fungal  infections, such as ketoconazole Certain medications for seizures, such as carbamazepine, phenobarbital, phenytoin Certain medications that treat or prevent blood clots, such as warfarin Cholestyramine Cyclosporine Digoxin Diuretics Estrogen or progestin hormones Isoniazid NSAIDs, medications for pain and inflammation, such as ibuprofen or naproxen Other medications for myasthenia gravis Rifampin Vaccines This list may not describe all possible interactions. Give your health care provider a list of all the medicines, herbs, non-prescription drugs, or dietary supplements you use. Also tell them if you smoke, drink alcohol, or use illegal drugs. Some items may interact with your medicine. What should I watch for while using this medication? Tell your care team if your symptoms do not start to get better or if they get worse. Do not stop taking except on your care team's advice. You may develop a severe reaction. Your care team will tell you how much medication to take. This medication may increase your risk of getting an infection. Tell your care team if you are around anyone with measles or chickenpox, or if you develop sores or blisters that do not heal properly. This medication may increase blood sugar levels. Ask your care team if changes in diet or medications are needed if you have diabetes. Tell your care team right away if you have any change in your eyesight. Using this medication for a long time may increase your risk of low bone mass. Talk to your care team about bone health. What side effects may I notice from receiving this medication? Side effects that you should report to your care team as soon as possible: Allergic reactions--skin rash, itching, hives, swelling of the face, lips, tongue, or throat Cushing syndrome--increased fat around the midsection, upper back, neck, or face, pink or purple stretch marks on the skin, thinning, fragile skin that easily bruises, unexpected hair  growth High blood sugar (hyperglycemia)--increased thirst or amount of urine, unusual weakness or fatigue, blurry vision Increase in blood pressure Infection--fever, chills, cough, sore throat, wounds that don't heal, pain or trouble when passing urine, general feeling of discomfort or being unwell Low adrenal gland function--nausea, vomiting, loss of appetite, unusual weakness or fatigue, dizziness Mood and behavior changes--anxiety, nervousness, confusion, hallucinations, irritability, hostility, thoughts of suicide or self-harm, worsening mood, feelings of depression Stomach bleeding--bloody or black, tar-like stools, vomiting blood or brown material that looks like coffee grounds Swelling of the ankles, hands, or feet Side effects that usually do not require medical attention (report to your care team if they continue or are bothersome): Acne General discomfort and fatigue  Headache Increase in appetite Nausea Trouble sleeping Weight gain This list may not describe all possible side effects. Call your doctor for medical advice about side effects. You may report side effects to FDA at 1-800-FDA-1088. Where should I keep my medication? Keep out of the reach of children and pets. Store at room temperature between 20 and 25 degrees C (68 and 77 degrees F). Throw away any unused medication after the expiration date. NOTE: This sheet is a summary. It may not cover all possible information. If you have questions about this medicine, talk to your doctor, pharmacist, or health care provider.  2023 Elsevier/Gold Standard (2020-09-18 00:00:00) Lidocaine Patches What is this medication? LIDOCAINE (LYE doe kane) treats pain. It works by numbing a specific area of the body, which blocks pain signals going to the brain. It belongs to a group of medications called local anesthetics. This medicine may be used for other purposes; ask your health care provider or pharmacist if you have questions. COMMON  BRAND NAME(S): Aspercreme with Lidocaine, Blue-Emu, DERMALID, GEN7T, Lidocan, Lidocare, Lidoderm, Lidofore, LidoReal-30, Lidozo, Salonpas Lidocaine, Xyliderm, ZTlido What should I tell my care team before I take this medication? They need to know if you have any of these conditions: G6PD deficiency Heart disease Kidney disease Liver disease Skin conditions or sensitivity An unusual or allergic reaction to lidocaine, parabens, other medications, foods, dyes, or preservatives Pregnant or trying to get pregnant Breast-feeding How should I use this medication? This medication is for external use only. Use it as directed on the label. Do not apply to burned or damaged skin. Do not use it more often than directed. Keep using it unless your care team tells you to stop. Talk to your care team about the use of this medication in children. Special care may be needed. Overdosage: If you think you have taken too much of this medicine contact a poison control center or emergency room at once. NOTE: This medicine is only for you. Do not share this medicine with others. What if I miss a dose? If you miss a dose, use it as soon as you can. If it is almost time for your next dose, use only that dose. Do not use double or extra doses. What may interact with this medication? This medication may interact with the following: Acetaminophen Certain antibiotics, such as dapsone, nitrofurantoin, aminosalicylic acid, sulfasalazine Certain medications for seizures, such as phenobarbital, phenytoin, valproic acid Chloroquine Cyclophosphamide Dofetilide Flutamide Hydroxyurea Ifosfamide Local anesthetics, such as lidocaine, pramoxine, tetracaine MAOIs, such as Carbex, Eldepryl, Marplan, Nardil, and Parnate Metoclopramide Moricizine Nitroglycerin Primaquine Saquinavir Quinine This list may not describe all possible interactions. Give your health care provider a list of all the medicines, herbs,  non-prescription drugs, or dietary supplements you use. Also tell them if you smoke, drink alcohol, or use illegal drugs. Some items may interact with your medicine. What should I watch for while using this medication? Visit your care team for regular checks on your progress. Tell your care team if your symptoms do not start to get better or if they get worse. Be careful to avoid injury while the area is numb, and you are not aware of pain. If you are going to need surgery, an MRI, CT scan, or other procedure, tell your care team that you are using this medication. You may need to remove the patch before the procedure. What side effects may I notice from receiving this medication? Side effects that you should report to your care team  as soon as possible: Allergic reactions--skin rash, itching, hives, swelling of the face, lips, tongue, or throat Headache, unusual weakness or fatigue, shortness of breath, nausea, vomiting, rapid heartbeat, blue skin or lips, which may be signs of methemoglobinemia Heart rhythm changes--fast or irregular heartbeat, dizziness, feeling faint or lightheaded, chest pain, trouble breathing Side effects that usually do not require medical attention (report to your care team if they continue or are bothersome): Change in skin color Irritation at application site This list may not describe all possible side effects. Call your doctor for medical advice about side effects. You may report side effects to FDA at 1-800-FDA-1088. Where should I keep my medication? Keep out of the reach of children and pets. See product for storage information. Each product may have different instructions. Get rid of any unused medication after the expiration date. To get rid of medications that are no longer needed or have expired: Take the medication to a medication take-back program. Check with your pharmacy or law enforcement to find a location. If you cannot return the medication, ask your  pharmacist or care team how to get rid of this medication safely. NOTE: This sheet is a summary. It may not cover all possible information. If you have questions about this medicine, talk to your doctor, pharmacist, or health care provider.  2023 Elsevier/Gold Standard (2021-07-12 00:00:00)

## 2023-09-30 NOTE — Progress Notes (Signed)
 [facility]    Provider:  Dr Lucia Gaskins Requesting Provider: Bennie Pierini, * Primary Care Provider:  Bennie Pierini, FNP  CC:  chronic migraines  HPI:  Erica Rodriguez is a 38 y.o. female here as follow up on chronic migraines. She has been getting botox for migraines since I initially saw her and she has been doing well also with laying of aimovig on top. I provided her botox until 08/2022 when NP took over. She was sent to [facility], she has had 2 ablations which helped in the cervical spine and low back, trogger point injections, ESI, she has neck tightness and occipital pain, she has had ablations she went to the surgery center for that (radiofreq ablation on the occipital nerve on the right?). Mofascial cervical pain syndrome. She goes to a chiropractor and that helps, she tried dry needling. She takes [place] and flexeril and hydrocotone for her neck and back and that helps, Megan for botox fo rmigraines, still doing well on aimovig. Continue to botox. Occipital neuralgia.  send to Dr. Ethelene Hal and Leta Speller needs c2/c3 m3dial branch blocks and occipital nerve blocks at [facility]. at [facility] recommend dry needling. Also cervical myofascial pain, tight cervical muscles,   Reviewed notes, labs and imaging from outside physicians, which showed   CLINICAL DATA:  Low back pain.  Injury due to fall.   EXAM: LUMBAR SPINE - 2-3 VIEW   COMPARISON:  None.   FINDINGS: There is no evidence of lumbar spine fracture. Alignment is normal. Intervertebral disc spaces are maintained.   IMPRESSION: Negative.     Electronically Signed   By: Wiliam Ke M.D.   On: 10/11/2021 16:16     Latest Ref Rng & Units 01/21/2023    9:03 AM 01/27/2022   10:12 AM 02/28/2021    2:19 PM  CBC  WBC 3.4 - 10.8 x10E3/uL 5.4  5.3  7.4   Hemoglobin 11.1 - 15.9 g/dL 16.1  09.6  04.5   Hematocrit 34.0 - 46.6 % 41.7  39.9  38.7   Platelets 150 - 450 x10E3/uL 316  369  476        Latest Ref Rng & Units 01/21/2023    9:03 AM 01/27/2022   10:12 AM 01/24/2021   11:25 AM  CMP  Glucose 70 - 99 mg/dL 82  91  409   BUN 6 - 20 mg/dL 11  12  12    Creatinine 0.57 - 1.00 mg/dL 8.11  9.14  7.82   Sodium 134 - 144 mmol/L 142  140  141   Potassium 3.5 - 5.2 mmol/L 4.6  4.7  4.2   Chloride 96 - 106 mmol/L 107  109  107   CO2 20 - 29 mmol/L 22  20  19    Calcium 8.7 - 10.2 mg/dL 9.4  9.2  9.7   Total Protein 6.0 - 8.5 g/dL 6.9  6.7  7.6   Total Bilirubin 0.0 - 1.2 mg/dL 0.5  0.3  0.3   Alkaline Phos 44 - 121 IU/L 57  67  96   AST 0 - 40 IU/L 13  14  10    ALT 0 - 32 IU/L 22  12  9       Review of Systems: Patient complains of symptoms per HPI as well as the following symptoms none. Pertinent negatives and positives per HPI. All others negative.   Social History   Socioeconomic History   Marital status: Married    Spouse  name: Casimiro Needle   Number of children: 1   Years of education: 12   Highest education level: Some college, no degree  Occupational History    Comment: clincal registration  Tobacco Use   Smoking status: Never   Smokeless tobacco: Never  Vaping Use   Vaping status: Never Used  Substance and Sexual Activity   Alcohol use: No   Drug use: No   Sexual activity: Yes  Other Topics Concern   Not on file  Social History Narrative   Lives at home with husband and son   Drinks 1 soda a day    Social Drivers of Health   Financial Resource Strain: Low Risk  (07/14/2023)   Overall Financial Resource Strain (CARDIA)    Difficulty of Paying Living Expenses: Not hard at all  Food Insecurity: No Food Insecurity (07/14/2023)   Hunger Vital Sign    Worried About Running Out of Food in the Last Year: Never true    Ran Out of Food in the Last Year: Never true  Transportation Needs: No Transportation Needs (09/04/2023)   Received from [facility]   PRAPARE - Transportation    Lack of Transportation (Medical): No    Lack of Transportation (Non-Medical):  No  Physical Activity: Inactive (07/14/2023)   Exercise Vital Sign    Days of Exercise per Week: 0 days    Minutes of Exercise per Session: 40 min  Stress: No Stress Concern Present (09/04/2023)   Received from [facility] of Occupational Health - Occupational Stress Questionnaire    Feeling of Stress : Only a little  Social Connections: Moderately Isolated (07/14/2023)   Social Connection and Isolation Panel [NHANES]    Frequency of Communication with Friends and Family: Twice a week    Frequency of Social Gatherings with Friends and Family: Three times a week    Attends Religious Services: Never    Active Member of Clubs or Organizations: No    Attends Banker Meetings: Not on file    Marital Status: Married  Catering manager Violence: Not At Risk (09/04/2023)   Received from [facility]   Humiliation, Afraid, Rape, and Kick questionnaire    Fear of Current or Ex-Partner: No    Emotionally Abused: No    Physically Abused: No    Sexually Abused: No    Family History  Problem Relation Age of Onset   Thyroid disease Mother    Hyperlipidemia Mother    Arthritis/Rheumatoid Mother    Diabetes Mother    Stroke Mother    Hypertension Father    Kidney disease Father    Cirrhosis Father    Liver cancer Father    Diabetes Maternal Grandmother    Stroke Maternal Grandmother    Breast cancer Maternal Grandmother    Stroke Maternal Grandfather    Healthy Son     Past Medical History:  Diagnosis Date   Depression    Headache    migraines   Mild preeclampsia     Patient Active Problem List   Diagnosis Date Noted   Bilateral occipital neuralgia 10/04/2023   Neck muscle spasm 10/04/2023   Migraine without status migrainosus, not intractable 10/04/2023   Dupuytren's contracture of right hand 07/29/2021   Chronic migraine without aura without status migrainosus, not intractable 02/18/2021   Plantar fasciitis 12/25/2020   Ulnar nerve  compression, right 12/25/2020   Bilateral carpal tunnel syndrome 02/15/2020   Chronic migraine without aura, with intractable  migraine, so stated, with status migrainosus 12/21/2018   Insomnia 10/07/2012   GAD (generalized anxiety disorder) 10/07/2012    Past Surgical History:  Procedure Laterality Date   CARPAL TUNNEL RELEASE Right 2021   carpel tunnel - left     CESAREAN SECTION     FOOT SURGERY Bilateral    neck and back ablation     [facility]   PLANTAR FASCIA RELEASE  2021   steroid injections in neck     By Dr. Paschal Dopp NERVE REPAIR     WISDOM TOOTH EXTRACTION      Current Outpatient Medications  Medication Sig Dispense Refill   botulinum toxin Type A (BOTOX) 200 units injection Provider to inject 155 units into the muscles of the head and neck every 12 weeks. Discard remainder. 1 each 3   buPROPion (WELLBUTRIN XL) 300 MG 24 hr tablet Take 1 tablet (300 mg total) by mouth daily. 90 tablet 1   celecoxib (CELEBREX) 200 MG capsule Take 1 capsule (200 mg total) by mouth once to twice a day with food for joint pain. 60 capsule 1   citalopram (CELEXA) 20 MG tablet Take 1 tablet (20 mg total) by mouth daily. 30 tablet 5   cyanocobalamin (VITAMIN B12) 1000 MCG/ML injection Inject 1 mL (1,000 mcg total) into the muscle every 30 days 3 mL 1   cyclobenzaprine (FLEXERIL) 5 MG tablet Take 1 tablet (5 mg total) by mouth 3 (three) times daily as needed for muscle spasms 90 tablet 1   diazepam (VALIUM) 10 MG tablet Take 1 tablet (10 mg total) by mouth every 12 (twelve) hours as needed for anxiety. 30 tablet 0   Erenumab-aooe 140 MG/ML SOAJ Inject 140 mg into the skin every 30 (thirty) days. 3 mL 3   gabapentin (NEURONTIN) 600 MG tablet Take 1 tablet (600 mg total) by mouth 4 (four) times daily. 120 tablet 3   HYDROcodone-acetaminophen (NORCO/VICODIN) 5-325 MG tablet Take 1 tablet by mouth 3 (three) times daily as needed. 45 tablet 0   ketorolac (TORADOL) 60 MG/2ML SOLN injection Inject  2 mLs (60 mg total) into the muscle, at onset of migraine. May repeat in 6 hours. Use a max of 2 times daily and 4 days a month. 10 mL 0   MIRENA, 52 MG, 20 MCG/24HR IUD      OnabotulinumtoxinA (BOTOX IM) Inject into the muscle. 155units IM to head and neck every 3 months for migraines.     ondansetron (ZOFRAN-ODT) 4 MG disintegrating tablet Dissolve 1-2 tablets (4-8 mg total) by mouth every 8 (eight) hours as needed for nausea or vomiting. 30 tablet 11   propranolol ER (INDERAL LA) 80 MG 24 hr capsule Take 1 capsule (80 mg total) by mouth daily. 90 capsule 3   topiramate (TOPAMAX) 100 MG tablet Take 1 tablet (100 mg total) by mouth 2 (two) times daily. 180 tablet 0   Ubrogepant (UBRELVY) 100 MG TABS Take 1 tablet by mouth daily as needed. Take one tablet at onset of headache, may repeat 1 tablet in 2 hours, no more than 2 tablets in 24 hours 16 tablet 11   zolpidem (AMBIEN) 10 MG tablet Take 1 tablet (10 mg total) by mouth at bedtime as needed for sleep. 30 tablet 5   Current Facility-Administered Medications  Medication Dose Route Frequency Provider Last Rate Last Admin   cyanocobalamin ((VITAMIN B-12)) injection 1,000 mcg  1,000 mcg Intramuscular Q30 days Bennie Pierini, FNP   1,000 mcg at  05/26/19 1035    Allergies as of 09/30/2023   (No Known Allergies)    Vitals: BP 120/80 (BP Location: Right Arm, Patient Position: Sitting, Cuff Size: Normal)   Pulse 89   Ht 5\' 1"  (1.549 m)   Wt 111 lb (50.3 kg)   BMI 20.97 kg/m  Last Weight:  Wt Readings from Last 1 Encounters:  09/30/23 111 lb (50.3 kg)   Last Height:   Ht Readings from Last 1 Encounters:  09/30/23 5\' 1"  (1.549 m)     Physical exam: Exam: Gen: NAD, conversant, well nourised, well groomed                     CV: RRR, no MRG. No Carotid Bruits. No peripheral edema, warm, nontender Eyes: Conjunctivae clear without exudates or hemorrhage MSK: pain on palpation at emergence of occipital nerves. Also tight cervical  muscles.   Neuro: Detailed Neurologic Exam  Speech:    Speech is normal; fluent and spontaneous with normal comprehension.  Cognition:    The patient is oriented to person, place, and time;     recent and remote memory intact;     language fluent;     normal attention, concentration,     fund of knowledge Cranial Nerves:    The pupils are equal, round, and reactive to light. The fundi are normal and spontaneous venous pulsations are present. Visual fields are full to finger confrontation. Extraocular movements are intact. Trigeminal sensation is intact and the muscles of mastication are normal. The face is symmetric. The palate elevates in the midline. Hearing intact. Voice is normal. Shoulder shrug is normal. The tongue has normal motion without fasciculations.   Coordination: nml  Gait: nml  Motor Observation:    No asymmetry, no atrophy, and no involuntary movements noted. Tone:    Normal muscle tone.    Posture:    Posture is normal. normal erect    Strength:    Strength is V/V in the upper and lower limbs.      Sensation: intact to LT     Reflex Exam:  DTR's:    Deep tendon reflexes in the upper and lower extremities are symmetrical bilaterally.   Toes:    The toes are downgoing bilaterally.   Clonus:    Clonus is absent.    Assessment/Plan:  Patient with chronic migraines and neck pain/occipital neuralgia. She has been getting botox for migraines since I initially saw her and she has been doing well also with laying of aimovig on top. She also has chronic neck and back pain and she was sent to [facility], she has had 2 ablations which helped in the cervical spine and low back, trogger point injections, ESI, she has neck tightness and occipital pain, she has had ablations she went to the surgery center for that (radiofreq ablation on the occipital nerve on the right? unlcear). Also cervical myofascial pain, tight cervical muscles and occipital neuralgia     Migraines: Discussed: Continue Propranolol, topiramate, aimovig as prevention and ubrelvy as acute doing well, also botox q43months   Chronic neck pain and neuralgia: flexeril and below; Myofascial cervical pain syndrome. She goes to a chiropractor and that helps, she tried dry needling. She takes [place] and flexeril and hydrocotone for her neck and back and that helps, sees [facility], continue  Occipital neuralgia Options discussed: Neuro exam in non focal. Pain is bilateral and is located in the distribution of the greater, lesser and/or third occipital  nerves, paroxysmal and brief, painful, sharp, with tenderness and trigger points at the emergence of the greater occipital nerve with chronic dullness in between   Send to Dr. Ethelene Hal and Leta Speller needs c2/c3 m3dial branch blocks or occipital nerve blocks or whatever procedure as per [facility]. Recommend at [facility] recommend dry needling.. Also cervical myofascial pain, tight cervical muscles.  - Consider Occipital nerve blocks -Trigger point injections/dry needling - PT  -c2/c3 medial branch blocks -. Gamma knife or radiofequency Ablation - these are invasive procedures unlike nerve blocks which are more superficial        Occipital neuralgia  Occipital Neuralgia    Cc: Bennie Pierini, *,  [name], [name], FNP  Naomie Dean, MD  [facility] [address] [place], [place] [address]  Phone [phone] Fax [phone]  I spent 40 minutes of face-to-face and non-face-to-face time with patient on the  1. Bilateral occipital neuralgia   2. Chronic migraine without aura, with intractable migraine, so stated, with status migrainosus   3. Neck muscle spasm   4. Chronic migraine without aura without status migrainosus, not intractable   5. Migraine without status migrainosus, not intractable, unspecified migraine type   6. Nausea   7.  Ptosis of right eyelid    diagnosis.  This included previsit chart review, lab review, study review, order entry, electronic health record documentation, patient education on the different diagnostic and therapeutic options, counseling and coordination of care, risks and benefits of management, compliance, or risk factor reduction

## 2023-10-04 ENCOUNTER — Encounter

## 2023-10-04 DIAGNOSIS — G43909 Migraine, unspecified, not intractable, without status migrainosus: Secondary | ICD-10-CM | POA: Insufficient documentation

## 2023-10-04 DIAGNOSIS — M62838 Other muscle spasm: Secondary | ICD-10-CM | POA: Insufficient documentation

## 2023-10-04 DIAGNOSIS — M5481 Occipital neuralgia: Secondary | ICD-10-CM | POA: Insufficient documentation

## 2023-10-05 ENCOUNTER — Telehealth: Payer: Self-pay | Admitting: Neurology

## 2023-10-05 ENCOUNTER — Other Ambulatory Visit: Payer: Self-pay

## 2023-10-05 ENCOUNTER — Other Ambulatory Visit (HOSPITAL_COMMUNITY): Payer: Self-pay

## 2023-10-05 ENCOUNTER — Other Ambulatory Visit: Payer: Self-pay | Admitting: Neurology

## 2023-10-05 DIAGNOSIS — G43709 Chronic migraine without aura, not intractable, without status migrainosus: Secondary | ICD-10-CM

## 2023-10-05 MED ORDER — HYDROCODONE-ACETAMINOPHEN 5-325 MG PO TABS
1.0000 | ORAL_TABLET | Freq: Three times a day (TID) | ORAL | 0 refills | Status: DC | PRN
  Filled 2023-10-05: qty 45, 15d supply, fill #0

## 2023-10-05 NOTE — Telephone Encounter (Signed)
Referral for pain clinic fax to [facility]. Phone: [phone], Fax: [phone]. 

## 2023-10-06 ENCOUNTER — Other Ambulatory Visit (HOSPITAL_COMMUNITY): Payer: Self-pay

## 2023-10-06 ENCOUNTER — Other Ambulatory Visit: Payer: Self-pay

## 2023-10-06 DIAGNOSIS — M5412 Radiculopathy, cervical region: Secondary | ICD-10-CM | POA: Diagnosis not present

## 2023-10-06 MED ORDER — PROPRANOLOL HCL ER 80 MG PO CP24
80.0000 mg | ORAL_CAPSULE | Freq: Every day | ORAL | 3 refills | Status: DC
  Filled 2023-10-06: qty 90, 90d supply, fill #0
  Filled 2024-01-25: qty 90, 90d supply, fill #1
  Filled 2024-05-28: qty 90, 90d supply, fill #2

## 2023-10-06 NOTE — Telephone Encounter (Signed)
 Received a phone call from [place] at [facility]. Stated Dr. Belva Bertin wanted to call and make sure this was meant to go to them as the notes from Dr. Lucia Gaskins state to send to dr. Ethelene Hal as she is already established there. I reviewed notes and it does say to send to [facility] Dr. Ethelene Hal, I told her to go ahead and close out the referral to them and we will re-route to [facility] for patient. Pattricia Boss can you please re-route this referral? Thank you

## 2023-10-07 IMAGING — DX DG LUMBAR SPINE 2-3V
2 series · 2 of 2 positions shown · non-contrast
Comparison: None.

CLINICAL DATA: Low back pain.  Injury due to fall.

EXAM:
LUMBAR SPINE - 2-3 VIEW

[l-spine ap]
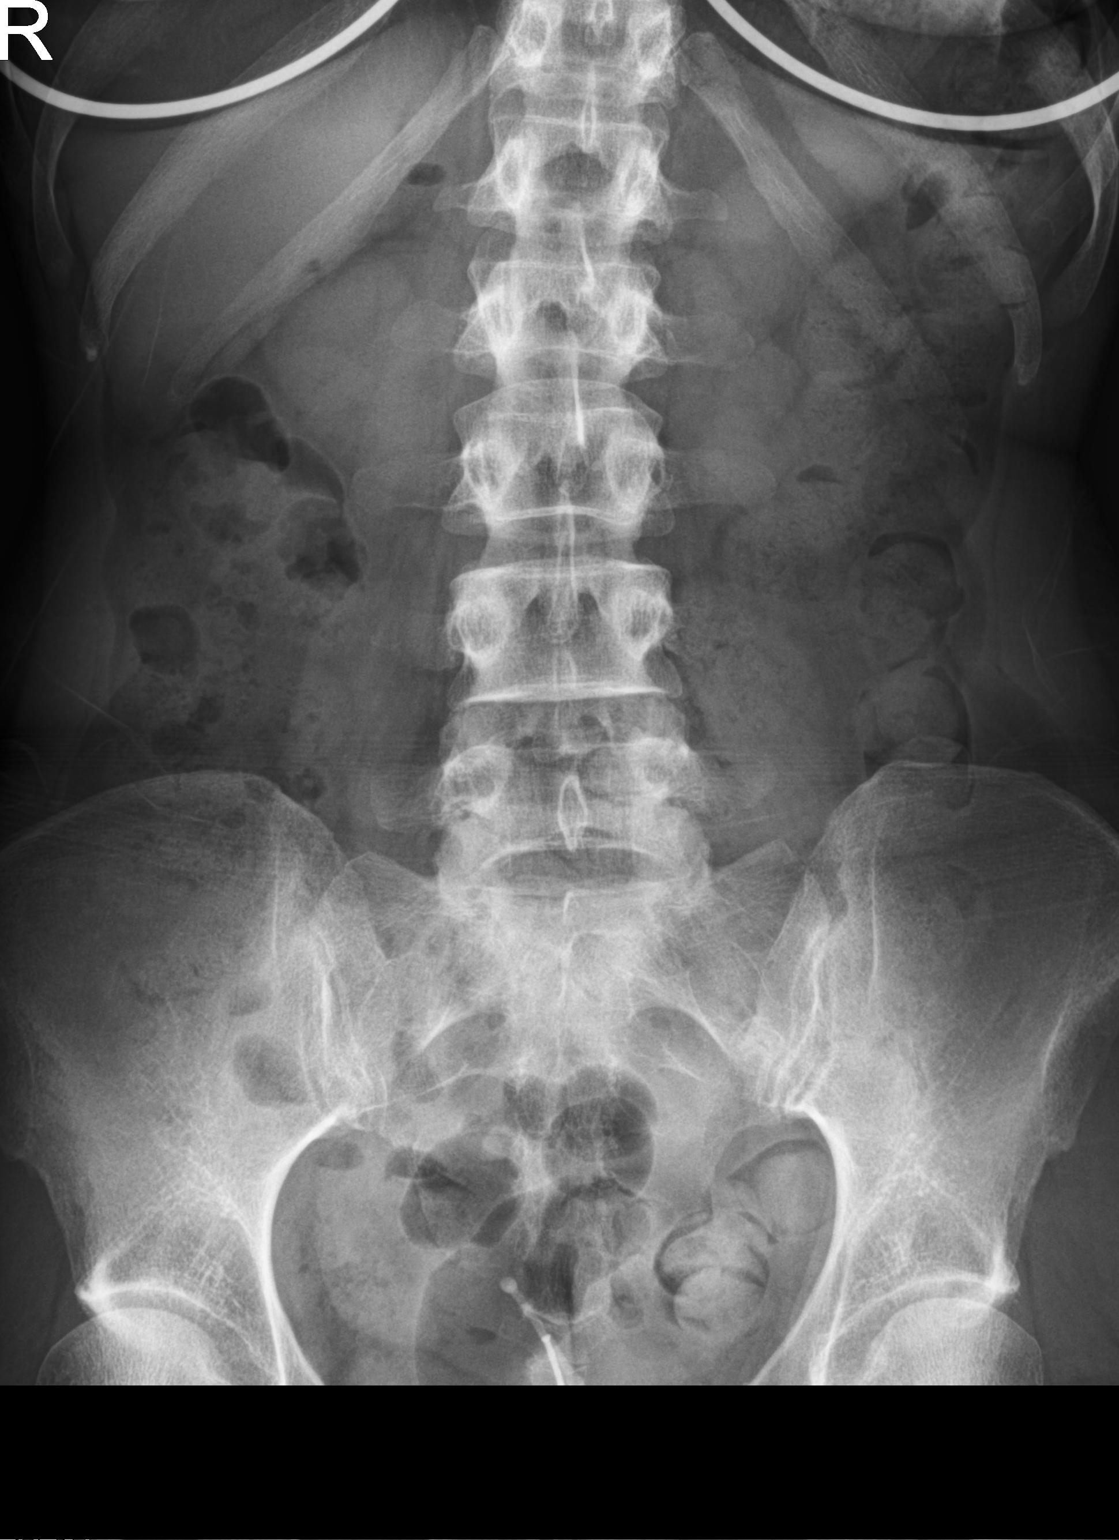

[l-spine lat]
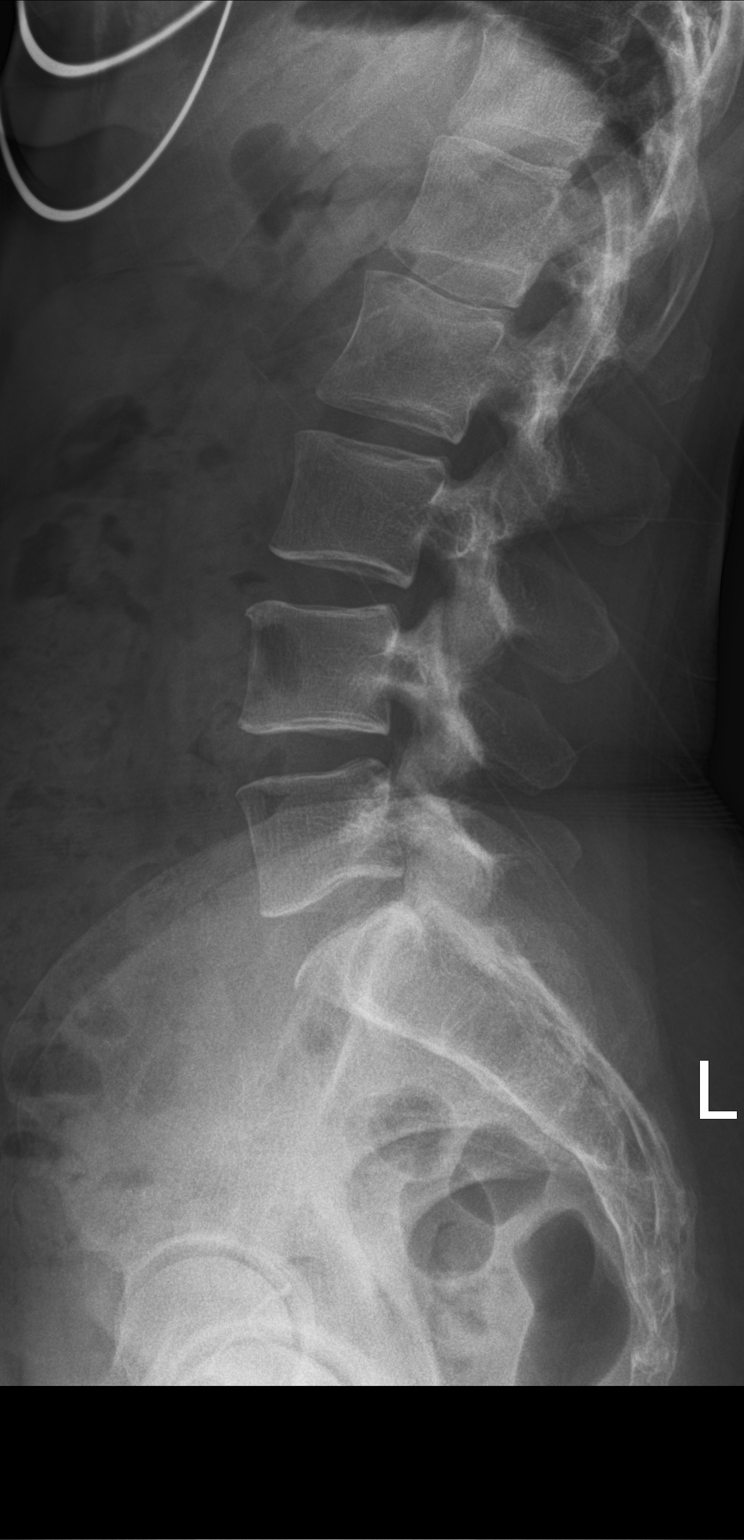

[2 of 2 positions shown; findings below may reference images not displayed]

FINDINGS: There is no evidence of lumbar spine fracture. Alignment is normal.
Intervertebral disc spaces are maintained.
IMPRESSION: Negative.

## 2023-10-07 NOTE — Telephone Encounter (Signed)
 Have refax referral for pain clinic to see Dr. Ethelene Hal to [facility]. Phone: [phone], Fax: [phone]

## 2023-10-13 ENCOUNTER — Other Ambulatory Visit (HOSPITAL_COMMUNITY): Payer: Self-pay

## 2023-10-15 ENCOUNTER — Other Ambulatory Visit: Payer: Self-pay | Admitting: Pharmacy Technician

## 2023-10-15 ENCOUNTER — Other Ambulatory Visit (HOSPITAL_COMMUNITY): Payer: Self-pay

## 2023-10-15 ENCOUNTER — Other Ambulatory Visit: Payer: Self-pay

## 2023-10-15 NOTE — Progress Notes (Signed)
 Specialty Pharmacy Refill Coordination Note  Erica Rodriguez is a 38 y.o. female contacted today regarding refills of specialty medication(s) OnabotulinumtoxinA (Botox)   Patient requested Courier to Provider Office   Delivery date: 10/28/23   Verified address: [facility] [address], [place], [place] [address]   Medication will be filled on 10/28/23.     Patient next appointment for injection is on 11/04/23.

## 2023-10-17 DIAGNOSIS — M5481 Occipital neuralgia: Secondary | ICD-10-CM | POA: Diagnosis not present

## 2023-10-28 ENCOUNTER — Telehealth: Admitting: Physician Assistant

## 2023-10-28 ENCOUNTER — Other Ambulatory Visit: Payer: Self-pay

## 2023-10-28 DIAGNOSIS — B370 Candidal stomatitis: Secondary | ICD-10-CM | POA: Diagnosis not present

## 2023-10-28 MED ORDER — NYSTATIN 100000 UNIT/ML MT SUSP: 5.0000 mL | Freq: Four times a day (QID) | OROMUCOSAL | 0 refills | Status: DC

## 2023-10-28 NOTE — Progress Notes (Signed)
 Based on what you have shared with me there is concern for oral thrush. I want you to continue to stay well-hydrated and keep up with good oral hygiene. I am adding on an oral solution, Nystatin , to use as directed to rid infection.  If symptoms are not resolving, or you note new or worsening symptoms, please seek an in-person evaluation ASAP.   Oral Thrush, Adult Oral thrush is an infection in your mouth and throat and on your tongue. It causes white patches to form in your mouth and on your tongue. Many cases of thrush are mild. But, sometimes, thrush can be serious. People who have a weak body defense system (immune system) or other diseases can be affected more. What are the causes? This condition is caused by a type of fungus called yeast. The fungus is normally present in small amounts in the mouth and nose. If a person has a long-term illness or a weak body defense system, the fungus can grow and spread quickly. This causes thrush. What increases the risk? You are more likely to develop this condition if: You have a weak body defense system. You are an older adult. You have diabetes, cancer, or HIV. You have a dry mouth. You are pregnant or breastfeeding. You do not take good care of your teeth. This risk is greater for people who have false teeth (dentures). You use antibiotic or steroid medicines. What are the signs or symptoms? Symptoms of this condition include: A burning feeling in the mouth and throat. White patches that stick to the mouth and tongue. A bad taste in the mouth or trouble tasting foods. A feeling like you have cotton in your mouth. Pain when you eat and swallow. Not wanting to eat as much as usual. Cracking at the corners of the mouth. How is this treated? This condition is treated with medicines called antifungals. These medicines prevent a fungus from growing. The medicines are either put right on the area (topical) or swallowed (oral). Your doctor will  also treat other problems that you may have, such as diabetes or HIV. Follow these instructions at home: Helping with pain and soreness To lessen your pain: Drink cold liquids, like water and iced tea. Eat frozen ice pops or frozen juices. Eat foods that are easy to swallow, like gelatin and ice cream. Drink from a straw if you have too much pain in your mouth.   General instructions Take or use over-the-counter and prescription medicines only as told by your doctor. Eat plain yogurt that has live cultures in it. Read the label to make sure that there are live cultures in your yogurt. If you wear false teeth: Take them out before you go to bed. Brush them well. Soak them in a cleaner. Rinse your mouth with warm salt-water many times a day. To make the salt-water mixture, dissolve -1 teaspoon (3-6 g) of salt in 1 cup (237 mL) of warm water. Contact a doctor if: Your problems do not get better within 7 days of treatment. Your infection is spreading. This may show as white areas on the skin outside of your mouth. You are nursing your baby and you have redness and pain in the nipples. Summary Oral thrush is an infection in your mouth and throat. It is caused by a fungus. You are more likely to get this condition if you have a weak body defense system. Diseases like diabetes, cancer, or HIV also add to your risk. This condition is treated with medicines  called antifungals. Contact a doctor if you do not get better within 7 days of starting treatment. This information is not intended to replace advice given to you by your health care provider. Make sure you discuss any questions you have with your health care provider. Document Revised: 06/09/2022 Document Reviewed: 06/09/2022 Elsevier Patient Education  2024 [employer].

## 2023-10-28 NOTE — Progress Notes (Signed)
 I have spent 5 minutes in review of e-visit questionnaire, review and updating patient chart, medical decision making and response to patient.   Piedad Climes, PA-C

## 2023-10-29 ENCOUNTER — Other Ambulatory Visit (HOSPITAL_COMMUNITY): Payer: Self-pay

## 2023-10-29 ENCOUNTER — Other Ambulatory Visit: Payer: Self-pay

## 2023-10-29 MED ORDER — GABAPENTIN 600 MG PO TABS
600.0000 mg | ORAL_TABLET | Freq: Four times a day (QID) | ORAL | 3 refills | Status: DC
  Filled 2023-10-29 – 2023-11-11 (×2): qty 120, 30d supply, fill #0
  Filled 2023-12-13: qty 120, 30d supply, fill #1
  Filled 2024-01-08 – 2024-01-14 (×2): qty 120, 30d supply, fill #2
  Filled 2024-02-06 – 2024-02-10 (×2): qty 120, 30d supply, fill #3

## 2023-11-02 ENCOUNTER — Other Ambulatory Visit: Payer: Self-pay

## 2023-11-02 ENCOUNTER — Other Ambulatory Visit: Payer: Self-pay | Admitting: Nurse Practitioner

## 2023-11-02 ENCOUNTER — Other Ambulatory Visit (HOSPITAL_COMMUNITY): Payer: Self-pay

## 2023-11-02 ENCOUNTER — Other Ambulatory Visit: Payer: Self-pay | Admitting: Neurology

## 2023-11-02 DIAGNOSIS — G43709 Chronic migraine without aura, not intractable, without status migrainosus: Secondary | ICD-10-CM

## 2023-11-02 DIAGNOSIS — S0300XD Dislocation of jaw, unspecified side, subsequent encounter: Secondary | ICD-10-CM

## 2023-11-02 MED ORDER — HYDROCODONE-ACETAMINOPHEN 5-325 MG PO TABS
1.0000 | ORAL_TABLET | Freq: Three times a day (TID) | ORAL | 0 refills | Status: DC | PRN
Start: 2023-11-02 — End: 2023-12-15
  Filled 2023-11-02: qty 45, 15d supply, fill #0

## 2023-11-03 ENCOUNTER — Other Ambulatory Visit: Payer: Self-pay | Admitting: Nurse Practitioner

## 2023-11-03 ENCOUNTER — Other Ambulatory Visit: Payer: Self-pay

## 2023-11-03 ENCOUNTER — Other Ambulatory Visit (HOSPITAL_COMMUNITY): Payer: Self-pay

## 2023-11-03 DIAGNOSIS — S0300XD Dislocation of jaw, unspecified side, subsequent encounter: Secondary | ICD-10-CM

## 2023-11-03 MED ORDER — TOPIRAMATE 100 MG PO TABS
100.0000 mg | ORAL_TABLET | Freq: Two times a day (BID) | ORAL | 3 refills | Status: DC
  Filled 2023-11-03: qty 180, 90d supply, fill #0
  Filled 2024-02-06: qty 180, 90d supply, fill #1
  Filled 2024-05-15: qty 180, 90d supply, fill #2

## 2023-11-04 ENCOUNTER — Other Ambulatory Visit: Payer: Self-pay

## 2023-11-04 ENCOUNTER — Ambulatory Visit: Payer: Commercial Managed Care - PPO | Admitting: Adult Health

## 2023-11-04 ENCOUNTER — Other Ambulatory Visit (HOSPITAL_COMMUNITY): Payer: Self-pay

## 2023-11-04 DIAGNOSIS — G43709 Chronic migraine without aura, not intractable, without status migrainosus: Secondary | ICD-10-CM | POA: Diagnosis not present

## 2023-11-04 MED ORDER — ONABOTULINUMTOXINA 200 UNITS IJ SOLR
155.0000 [IU] | Freq: Once | INTRAMUSCULAR | Status: AC
  Administered 2023-11-04: 140 [IU] via INTRAMUSCULAR

## 2023-11-04 NOTE — Progress Notes (Signed)
 Botox - 200 units x 1 vial Lot: [id number] Expiration: 10/2025 NDC: 1610-9604-54  Bacteriostatic 0.9% Sodium Chloride- 5 mL  Lot: [id number]  Expiration: 05/07/2024 NDC: 1478-2956-21  Dx: H08.657 S/P  Witnessed by Inocencio Mania, RN

## 2023-11-04 NOTE — Progress Notes (Signed)
 11/04/23: Last month more migraines. Uses ubrelvy . Used toradol  2 weeks ago but that was the first time in a while that she had to use that. Also uses flexeril  but also has neck pain. Remains on Topamax , propranolol  and Aimovig .   As I was almost done with botox  she mentioned that she got occipital nerve blocks several weeks ago. Reports that it was painful.   08/10/23: Patient states that her headaches have increased in the last 2 weeks.  She states that she can tell the Botox  has started to wear off.  She also notes that she usually gets ptosis of the right eye after injections.  Although it was not as bad as the first time she had injections.  She remains on Topamax , propranolol  and Aimovig  for prevention.  Has Ubrelvy , Toradol  and Flexeril  to use as needed.  This time she would like to avoid injecting around the eyes due to ptosis and asymmetry.  I did advise that if she feels that her migraines are worse this next around we can resume injecting at the next visit.  We did inject the frontalis however I went higher in the hairline.  04/28/2023: Patient feels her jaw pain exacerbates her migraines so placed 5U each masseter as well as 5U each at the line from the orb oculi to the temporalis muscle area where the pain presents   02/03/23: Stable- gets migraines when she is due for botox . Uses ubrelvy  and ibuprofen  when she does get a migraine. Reports that the last two injections with Dr. Tresia Fruit she had ptosis in the right eye. I injected higher in the forehead. Also offered to not injected the frontalis or corrugators but she wanted the injections.      BOTOX  PROCEDURE NOTE FOR MIGRAINE HEADACHE    Contraindications and precautions discussed with patient(above). Aseptic procedure was observed and patient tolerated procedure. Procedure performed by Clem Currier, NP  The condition has existed for more than 6 months, and pt does not have a diagnosis of ALS, Myasthenia Gravis or Lambert-Eaton  Syndrome.  Risks and benefits of injections discussed and pt agrees to proceed with the procedure.  Written consent obtained  These injections are medically necessary. He receives good benefits from these injections. These injections do not cause sedations or hallucinations which the oral therapies may cause.  Indication/Diagnosis: chronic migraine BOTOX (N6295) injection was performed according to protocol by [employer]. 200 units of BOTOX  was dissolved into 4 cc NS.   NDC: [address]-10  Type of toxin: Botox   Botox - 200 units x 1 vial Lot: [id number] Expiration: 10/2025 NDC: 2725-3664-40   Bacteriostatic 0.9% Sodium Chloride- 5 mL  Lot: [id number]     Expiration: 05/07/2024 NDC: 9563-8756-43   Dx: P29.518     Description of procedure:  The patient was placed in a sitting position. The standard protocol was used for Botox  as follows, with 5 units of Botox  injected at each site:   -Procerus muscle, deferred  -Corrugator muscle, deferred  -Frontalis muscle, bilateral injection, with 2 sites each side, medial injection was performed in the upper one third of the frontalis muscle, in the region vertical from the medial inferior edge of the superior orbital rim. The lateral injection was again in the upper one third of the forehead vertically above the lateral limbus of the cornea, 1.5 cm lateral to the medial injection site.  -Temporalis muscle injection, 4 sites, bilaterally. The first injection was 3 cm above the tragus of the ear, second injection site was  1.5 cm to 3 cm up from the first injection site in line with the tragus of the ear. The third injection site was 1.5-3 cm forward between the first 2 injection sites. The fourth injection site was 1.5 cm posterior to the second injection site.  -Occipitalis muscle injection, 3 sites, bilaterally. The first injection was done one half way between the occipital protuberance and the tip of the mastoid process behind the ear. The second  injection site was done lateral and superior to the first, 1 fingerbreadth from the first injection. The third injection site was 1 fingerbreadth superiorly and medially from the first injection site.  -Cervical paraspinal muscle injection, 2 sites, bilateral knee first injection site was 1 cm from the midline of the cervical spine, 3 cm inferior to the lower border of the occipital protuberance. The second injection site was 1.5 cm superiorly and laterally to the first injection site.  -Trapezius muscle injection was performed at 3 sites, bilaterally. The first injection site was in the upper trapezius muscle halfway between the inflection point of the neck, and the acromion. The second injection site was one half way between the acromion and the first injection site. The third injection was done between the first injection site and the inflection point of the neck.   Will return for repeat injection in 3 months.   A 200 unit sof Botox  was used, 140 units were injected, the rest of the Botox  was wasted. The patient tolerated the procedure well, there were no complications of the above procedure.  Clem Currier, MSN, NP-C 11/04/2023, 8:13 AM [facility] [address] [place], [place] [address] [phone]

## 2023-11-05 ENCOUNTER — Other Ambulatory Visit (HOSPITAL_COMMUNITY): Payer: Self-pay

## 2023-11-05 ENCOUNTER — Other Ambulatory Visit: Payer: Self-pay

## 2023-11-05 MED ORDER — DIAZEPAM 10 MG PO TABS
10.0000 mg | ORAL_TABLET | Freq: Two times a day (BID) | ORAL | 1 refills | Status: DC | PRN
  Filled 2023-11-05: qty 30, 15d supply, fill #0
  Filled 2024-01-14: qty 30, 15d supply, fill #1

## 2023-11-11 ENCOUNTER — Other Ambulatory Visit (HOSPITAL_COMMUNITY): Payer: Self-pay

## 2023-11-11 ENCOUNTER — Other Ambulatory Visit: Payer: Self-pay

## 2023-11-27 ENCOUNTER — Other Ambulatory Visit: Payer: Self-pay | Admitting: Neurology

## 2023-11-28 ENCOUNTER — Other Ambulatory Visit (HOSPITAL_COMMUNITY): Payer: Self-pay

## 2023-12-01 ENCOUNTER — Other Ambulatory Visit (HOSPITAL_COMMUNITY): Payer: Self-pay

## 2023-12-01 ENCOUNTER — Other Ambulatory Visit: Payer: Self-pay | Admitting: Neurology

## 2023-12-01 ENCOUNTER — Other Ambulatory Visit: Payer: Self-pay

## 2023-12-01 DIAGNOSIS — M62838 Other muscle spasm: Secondary | ICD-10-CM

## 2023-12-01 MED ORDER — AIMOVIG 140 MG/ML ~~LOC~~ SOAJ
140.0000 mg | SUBCUTANEOUS | 3 refills | Status: AC
  Filled 2023-12-01: qty 3, 90d supply, fill #0
  Filled 2024-03-08: qty 3, 90d supply, fill #1
  Filled 2024-06-14: qty 3, 90d supply, fill #2

## 2023-12-02 ENCOUNTER — Other Ambulatory Visit (HOSPITAL_COMMUNITY): Payer: Self-pay

## 2023-12-02 ENCOUNTER — Other Ambulatory Visit: Payer: Self-pay

## 2023-12-03 ENCOUNTER — Other Ambulatory Visit: Payer: Self-pay

## 2023-12-03 ENCOUNTER — Other Ambulatory Visit (HOSPITAL_COMMUNITY): Payer: Self-pay

## 2023-12-03 MED ORDER — CYCLOBENZAPRINE HCL 5 MG PO TABS
5.0000 mg | ORAL_TABLET | Freq: Three times a day (TID) | ORAL | 1 refills | Status: DC | PRN
  Filled 2023-12-03: qty 90, 30d supply, fill #0
  Filled 2023-12-30: qty 90, 30d supply, fill #1

## 2023-12-03 NOTE — Telephone Encounter (Signed)
 Last seen on 09/30/23 Follow up scheduled on 02/03/24 for botox  injection

## 2023-12-14 ENCOUNTER — Other Ambulatory Visit (HOSPITAL_COMMUNITY): Payer: Self-pay

## 2023-12-14 ENCOUNTER — Other Ambulatory Visit: Payer: Self-pay

## 2023-12-15 ENCOUNTER — Other Ambulatory Visit (HOSPITAL_COMMUNITY): Payer: Self-pay

## 2023-12-15 ENCOUNTER — Other Ambulatory Visit: Payer: Self-pay

## 2023-12-15 MED ORDER — HYDROCODONE-ACETAMINOPHEN 5-325 MG PO TABS
1.0000 | ORAL_TABLET | Freq: Three times a day (TID) | ORAL | 0 refills | Status: DC | PRN
  Filled 2023-12-15: qty 45, 15d supply, fill #0

## 2023-12-24 DIAGNOSIS — M5416 Radiculopathy, lumbar region: Secondary | ICD-10-CM | POA: Diagnosis not present

## 2023-12-30 ENCOUNTER — Other Ambulatory Visit: Payer: Self-pay | Admitting: Nurse Practitioner

## 2023-12-30 ENCOUNTER — Other Ambulatory Visit: Payer: Self-pay

## 2023-12-30 ENCOUNTER — Other Ambulatory Visit (HOSPITAL_COMMUNITY): Payer: Self-pay

## 2023-12-30 ENCOUNTER — Encounter: Payer: Self-pay | Admitting: Neurology

## 2023-12-30 DIAGNOSIS — F5101 Primary insomnia: Secondary | ICD-10-CM

## 2023-12-31 ENCOUNTER — Other Ambulatory Visit: Payer: Self-pay

## 2023-12-31 ENCOUNTER — Other Ambulatory Visit: Payer: Self-pay | Admitting: Nurse Practitioner

## 2023-12-31 ENCOUNTER — Ambulatory Visit: Admitting: Podiatry

## 2023-12-31 ENCOUNTER — Other Ambulatory Visit: Payer: Self-pay | Admitting: *Deleted

## 2023-12-31 DIAGNOSIS — G43711 Chronic migraine without aura, intractable, with status migrainosus: Secondary | ICD-10-CM

## 2023-12-31 DIAGNOSIS — F5101 Primary insomnia: Secondary | ICD-10-CM

## 2023-12-31 MED ORDER — KETOROLAC TROMETHAMINE 60 MG/2ML IM SOLN: 60.0000 mg | INTRAMUSCULAR | 0 refills | Status: AC

## 2023-12-31 NOTE — Progress Notes (Signed)
 Pt last seen for botox  11-04-2023 with Du[place], NP, last seen Dr. Ines 09-30-2023.  Ok to refill.  This was last prescribed 2023.

## 2024-01-01 ENCOUNTER — Other Ambulatory Visit: Payer: Self-pay | Admitting: Nurse Practitioner

## 2024-01-01 ENCOUNTER — Encounter: Admitting: Nurse Practitioner

## 2024-01-01 ENCOUNTER — Other Ambulatory Visit: Payer: Self-pay

## 2024-01-01 ENCOUNTER — Other Ambulatory Visit (HOSPITAL_COMMUNITY): Payer: Self-pay

## 2024-01-01 DIAGNOSIS — F5101 Primary insomnia: Secondary | ICD-10-CM

## 2024-01-01 MED ORDER — ZOLPIDEM TARTRATE 10 MG PO TABS
10.0000 mg | ORAL_TABLET | Freq: Every evening | ORAL | 5 refills | Status: DC | PRN
  Filled 2024-01-01: qty 30, 30d supply, fill #0

## 2024-01-03 ENCOUNTER — Other Ambulatory Visit: Payer: Self-pay | Admitting: Nurse Practitioner

## 2024-01-03 DIAGNOSIS — F411 Generalized anxiety disorder: Secondary | ICD-10-CM

## 2024-01-04 ENCOUNTER — Other Ambulatory Visit (HOSPITAL_COMMUNITY): Payer: Self-pay

## 2024-01-04 ENCOUNTER — Other Ambulatory Visit: Payer: Self-pay

## 2024-01-04 MED ORDER — BUPROPION HCL ER (XL) 300 MG PO TB24
300.0000 mg | ORAL_TABLET | Freq: Every day | ORAL | 0 refills | Status: DC
  Filled 2024-01-04: qty 90, 90d supply, fill #0

## 2024-01-06 ENCOUNTER — Ambulatory Visit: Admitting: Podiatry

## 2024-01-07 ENCOUNTER — Other Ambulatory Visit (HOSPITAL_COMMUNITY): Payer: Self-pay

## 2024-01-07 ENCOUNTER — Other Ambulatory Visit (HOSPITAL_BASED_OUTPATIENT_CLINIC_OR_DEPARTMENT_OTHER): Payer: Self-pay

## 2024-01-07 DIAGNOSIS — M5412 Radiculopathy, cervical region: Secondary | ICD-10-CM | POA: Diagnosis not present

## 2024-01-07 MED ORDER — HYDROCODONE-ACETAMINOPHEN 5-325 MG PO TABS
1.0000 | ORAL_TABLET | Freq: Three times a day (TID) | ORAL | 0 refills | Status: DC | PRN
  Filled 2024-01-07 (×3): qty 45, 15d supply, fill #0

## 2024-01-09 ENCOUNTER — Other Ambulatory Visit (HOSPITAL_COMMUNITY): Payer: Self-pay

## 2024-01-11 ENCOUNTER — Other Ambulatory Visit (HOSPITAL_COMMUNITY): Payer: Self-pay

## 2024-01-12 ENCOUNTER — Other Ambulatory Visit: Payer: Self-pay

## 2024-01-15 ENCOUNTER — Other Ambulatory Visit (HOSPITAL_COMMUNITY): Payer: Self-pay

## 2024-01-15 ENCOUNTER — Other Ambulatory Visit: Payer: Self-pay

## 2024-01-15 ENCOUNTER — Encounter: Payer: Self-pay | Admitting: Nurse Practitioner

## 2024-01-15 ENCOUNTER — Ambulatory Visit (INDEPENDENT_AMBULATORY_CARE_PROVIDER_SITE_OTHER): Admitting: Nurse Practitioner

## 2024-01-15 ENCOUNTER — Other Ambulatory Visit: Payer: Self-pay | Admitting: Nurse Practitioner

## 2024-01-15 VITALS — BP 109/76 | HR 96 | Temp 98.5°F | Ht 61.0 in | Wt 110.0 lb

## 2024-01-15 DIAGNOSIS — F5101 Primary insomnia: Secondary | ICD-10-CM | POA: Diagnosis not present

## 2024-01-15 DIAGNOSIS — F411 Generalized anxiety disorder: Secondary | ICD-10-CM

## 2024-01-15 DIAGNOSIS — Z0001 Encounter for general adult medical examination with abnormal findings: Secondary | ICD-10-CM | POA: Diagnosis not present

## 2024-01-15 DIAGNOSIS — G43709 Chronic migraine without aura, not intractable, without status migrainosus: Secondary | ICD-10-CM

## 2024-01-15 DIAGNOSIS — S0300XD Dislocation of jaw, unspecified side, subsequent encounter: Secondary | ICD-10-CM

## 2024-01-15 DIAGNOSIS — Z0000 Encounter for general adult medical examination without abnormal findings: Secondary | ICD-10-CM | POA: Diagnosis not present

## 2024-01-15 MED ORDER — BUPROPION HCL ER (XL) 300 MG PO TB24
300.0000 mg | ORAL_TABLET | Freq: Every day | ORAL | 1 refills | Status: DC
  Filled 2024-01-15: qty 90, 90d supply, fill #0

## 2024-01-15 MED ORDER — CITALOPRAM HYDROBROMIDE 20 MG PO TABS
20.0000 mg | ORAL_TABLET | Freq: Every day | ORAL | 5 refills | Status: DC
  Filled 2024-01-15: qty 30, 30d supply, fill #0

## 2024-01-15 MED ORDER — BUPROPION HCL ER (XL) 300 MG PO TB24
300.0000 mg | ORAL_TABLET | Freq: Every day | ORAL | 1 refills | Status: DC
  Filled 2024-01-15 – 2024-04-10 (×2): qty 90, 90d supply, fill #0
  Filled 2024-07-05: qty 90, 90d supply, fill #1

## 2024-01-15 MED ORDER — ZOLPIDEM TARTRATE 10 MG PO TABS
10.0000 mg | ORAL_TABLET | Freq: Every evening | ORAL | 5 refills | Status: DC | PRN
  Filled 2024-01-15 – 2024-01-29 (×3): qty 30, 30d supply, fill #0
  Filled 2024-02-29: qty 30, 30d supply, fill #1
  Filled 2024-03-30: qty 30, 30d supply, fill #2
  Filled 2024-05-02: qty 30, 30d supply, fill #3
  Filled 2024-05-28 – 2024-05-31 (×2): qty 30, 30d supply, fill #4
  Filled 2024-06-26 – 2024-06-28 (×2): qty 30, 30d supply, fill #5

## 2024-01-15 MED ORDER — DIAZEPAM 10 MG PO TABS
10.0000 mg | ORAL_TABLET | Freq: Two times a day (BID) | ORAL | 1 refills | Status: DC | PRN
  Filled 2024-01-15 – 2024-03-08 (×2): qty 30, 15d supply, fill #0
  Filled 2024-05-15: qty 30, 15d supply, fill #1

## 2024-01-15 NOTE — Progress Notes (Signed)
 Subjective:    Patient ID: Erica Rodriguez, female    DOB: [birth year], 38 y.o.   MRN: [id number]   Chief Complaint: annual physical    HPI:  Erica Rodriguez is a 38 y.o. who identifies as a female who was assigned female at birth.   Social history: Lives with: husband and her mother Work history: [employer]   Comes in today for follow up of the following chronic medical issues:  1. Chronic migraine without aura, with intractable migraine, so stated, with status migrainosus Has occasioanlly but neurology is keeping her under control. She still has occasional episodes.  2. GAD (generalized anxiety disorder) Is on wellbutrin  and is doing ok.      01/15/2024    2:56 PM 06/26/2023    4:01 PM 06/02/2023   11:46 AM 01/16/2023    3:12 PM  GAD 7 : Generalized Anxiety Score  Nervous, Anxious, on Edge 0 2 2 0  Control/stop worrying 1 1 0 1  Worry too much - different things 1 0 2 0  Trouble relaxing 1 1 1  0  Restless 0 0 0 0  Easily annoyed or irritable 1 2 1 1   Afraid - awful might happen 0 0 0 0  Total GAD 7 Score 4 6 6 2   Anxiety Difficulty Not difficult at all Not difficult at all Not difficult at all Not difficult at all      01/15/2024    2:56 PM 06/26/2023    4:01 PM 06/02/2023   11:45 AM  Depression screen PHQ 2/9  Decreased Interest 0 2 1  Down, Depressed, Hopeless 0 0 0  PHQ - 2 Score 0 2 1  Altered sleeping  2 3  Tired, decreased energy  2 1  Change in appetite  0 0  Feeling bad or failure about yourself   0 0  Trouble concentrating  0 1  Moving slowly or fidgety/restless  0 0  Suicidal thoughts  0 0  PHQ-9 Score  6 6  Difficult doing work/chores  Not difficult at all Not difficult at all      3. Primary insomnia Is on ambien . Cannot sleep without meds   New complaints: None today  No Known Allergies Outpatient Encounter Medications as of 01/15/2024  Medication Sig   botulinum toxin Type A  (BOTOX ) 200 units injection Provider to inject 155 units  into the muscles of the head and neck every 12 weeks. Discard remainder.   buPROPion  (WELLBUTRIN  XL) 300 MG 24 hr tablet Take 1 tablet (300 mg total) by mouth daily.   celecoxib  (CELEBREX ) 200 MG capsule Take 1 capsule (200 mg total) by mouth once to twice a day with food for joint pain.   citalopram  (CELEXA ) 20 MG tablet Take 1 tablet (20 mg total) by mouth daily.   cyanocobalamin  (VITAMIN B12) 1000 MCG/ML injection Inject 1 mL (1,000 mcg total) into the muscle every 30 days   cyclobenzaprine  (FLEXERIL ) 5 MG tablet Take 1 tablet (5 mg total) by mouth 3 (three) times daily as needed for muscle spasms   diazepam  (VALIUM ) 10 MG tablet Take 1 tablet (10 mg total) by mouth every 12 (twelve) hours as needed for anxiety.   Erenumab -aooe (AIMOVIG ) 140 MG/ML SOAJ Inject 140 mg into the skin every 30 (thirty) days.   gabapentin  (NEURONTIN ) 600 MG tablet Take 1 tablet (600 mg total) by mouth 4 (four) times daily.   HYDROcodone -acetaminophen  (NORCO/VICODIN) 5-325 MG tablet Take 1 tablet by mouth 3 (three)  times daily as needed.   ketorolac  (TORADOL ) 60 MG/2ML SOLN injection Inject 2 mLs (60 mg total) into the muscle, at onset of migraine. May repeat in 6 hours. Use a max of 2 times daily and 4 days a month.   MIRENA , 52 MG, 20 MCG/24HR IUD    nystatin  (MYCOSTATIN ) 100000 UNIT/ML suspension Take 5 mLs (500,000 Units total) by mouth 4 (four) times daily. (Patient not taking: Reported on 11/04/2023)   OnabotulinumtoxinA  (BOTOX  IM) Inject into the muscle. 155units IM to head and neck every 3 months for migraines.   ondansetron  (ZOFRAN -ODT) 4 MG disintegrating tablet Dissolve 1-2 tablets (4-8 mg total) by mouth every 8 (eight) hours as needed for nausea or vomiting.   propranolol  ER (INDERAL  LA) 80 MG 24 hr capsule Take 1 capsule (80 mg total) by mouth daily.   topiramate  (TOPAMAX ) 100 MG tablet Take 1 tablet (100 mg total) by mouth 2 (two) times daily.   Ubrogepant  (UBRELVY ) 100 MG TABS Take 1 tablet by mouth daily  as needed. Take one tablet at onset of headache, may repeat 1 tablet in 2 hours, no more than 2 tablets in 24 hours   zolpidem  (AMBIEN ) 10 MG tablet Take 1 tablet (10 mg total) by mouth at bedtime as needed for sleep.   [DISCONTINUED] buPROPion  (WELLBUTRIN  XL) 300 MG 24 hr tablet Take 1 tablet (300 mg total) by mouth daily.   Facility-Administered Encounter Medications as of 01/15/2024  Medication   cyanocobalamin  ((VITAMIN B-12)) injection 1,000 mcg    Past Surgical History:  Procedure Laterality Date   CARPAL TUNNEL RELEASE Right 2021   carpel tunnel - left     CESAREAN SECTION     FOOT SURGERY Bilateral    neck and back ablation     [facility]   PLANTAR FASCIA RELEASE  2021   steroid injections in neck     By Dr. Bonner COWBOY NERVE REPAIR     WISDOM TOOTH EXTRACTION      Family History  Problem Relation Age of Onset   Thyroid  disease Mother    Hyperlipidemia Mother    Arthritis/Rheumatoid Mother    Diabetes Mother    Stroke Mother    Hypertension Father    Kidney disease Father    Cirrhosis Father    Liver cancer Father    Diabetes Maternal Grandmother    Stroke Maternal Grandmother    Breast cancer Maternal Grandmother    Stroke Maternal Grandfather    Healthy Son       Controlled substance contract: n/a     Review of Systems  Constitutional:  Negative for diaphoresis.  Eyes:  Negative for pain.  Respiratory:  Negative for shortness of breath.   Cardiovascular:  Negative for chest pain, palpitations and leg swelling.  Gastrointestinal:  Negative for abdominal pain.  Endocrine: Negative for polydipsia.  Skin:  Negative for rash.  Neurological:  Negative for dizziness, weakness and headaches.  Hematological:  Does not bruise/bleed easily.  All other systems reviewed and are negative.      Objective:   Physical Exam Vitals and nursing note reviewed.  Constitutional:      General: She is not in acute distress.    Appearance: Normal appearance. She  is well-developed.  Neck:     Vascular: No carotid bruit or JVD.  Cardiovascular:     Rate and Rhythm: Normal rate and regular rhythm.     Heart sounds: Normal heart sounds.  Pulmonary:  Effort: Pulmonary effort is normal. No respiratory distress.     Breath sounds: Normal breath sounds. No wheezing or rales.  Chest:     Chest wall: No tenderness.  Abdominal:     General: Bowel sounds are normal. There is no distension or abdominal bruit.     Palpations: Abdomen is soft. There is no hepatomegaly, splenomegaly, mass or pulsatile mass.     Tenderness: There is no abdominal tenderness.  Musculoskeletal:        General: Normal range of motion.     Cervical back: Normal range of motion and neck supple.  Lymphadenopathy:     Cervical: No cervical adenopathy.  Skin:    General: Skin is warm and dry.  Neurological:     Mental Status: She is alert and oriented to person, place, and time.     Deep Tendon Reflexes: Reflexes are normal and symmetric.  Psychiatric:        Behavior: Behavior normal.        Thought Content: Thought content normal.        Judgment: Judgment normal.    BP 109/76   Pulse 96   Temp 98.5 F (36.9 C) (Temporal)   Ht 5' 1 (1.549 m)   Wt 110 lb (49.9 kg)   SpO2 100%   BMI 20.78 kg/m           Assessment & Plan:   Erica Rodriguez comes in today with chief complaint of annual physical  Diagnosis and orders addressed:  1. Chronic migraine without aura, with intractable migraine, so stated, with status migrainosus Keep follow up with neurology  2. GAD (generalized anxiety disorder) Stress management - buPROPion  (WELLBUTRIN  XL) 300 MG 24 hr tablet; Take 1 tablet (300 mg total) by mouth daily.  Dispense: 90 tablet; Refill: 1  3. Primary insomnia Bedtime routine - zolpidem  (AMBIEN ) 10 MG tablet; Take 1 tablet (10 mg total) by mouth at bedtime as needed for sleep.  Dispense: 30 tablet; Refill: 5   Labs pending Health Maintenance reviewed Diet and  exercise encouraged  Follow up plan: 6 months   [name] Gladis, FNP

## 2024-01-15 NOTE — Patient Instructions (Signed)
 Exercising to Stay Healthy To become healthy and stay healthy, it is recommended that you do moderate-intensity and vigorous-intensity exercise. You can tell that you are exercising at a moderate intensity if your heart starts beating faster and you start breathing faster but can still hold a conversation. You can tell that you are exercising at a vigorous intensity if you are breathing much harder and faster and cannot hold a conversation while exercising. How can exercise benefit me? Exercising regularly is important. It has many health benefits, such as: Improving overall fitness, flexibility, and endurance. Increasing bone density. Helping with weight control. Decreasing body fat. Increasing muscle strength and endurance. Reducing stress and tension, anxiety, depression, or anger. Improving overall health. What guidelines should I follow while exercising? Before you start a new exercise program, talk with your health care provider. Do not exercise so much that you hurt yourself, feel dizzy, or get very short of breath. Wear comfortable clothes and wear shoes with good support. Drink plenty of water while you exercise to prevent dehydration or heat stroke. Work out until your breathing and your heartbeat get faster (moderate intensity). How often should I exercise? Choose an activity that you enjoy, and set realistic goals. Your health care provider can help you make an activity plan that is individually designed and works best for you. Exercise regularly as told by your health care provider. This may include: Doing strength training two times a week, such as: Lifting weights. Using resistance bands. Push-ups. Sit-ups. Yoga. Doing a certain intensity of exercise for a given amount of time. Choose from these options: A total of 150 minutes of moderate-intensity exercise every week. A total of 75 minutes of vigorous-intensity exercise every week. A mix of moderate-intensity and  vigorous-intensity exercise every week. Children, pregnant women, people who have not exercised regularly, people who are overweight, and older adults may need to talk with a health care provider about what activities are safe to perform. If you have a medical condition, be sure to talk with your health care provider before you start a new exercise program. What are some exercise ideas? Moderate-intensity exercise ideas include: Walking 1 mile (1.6 km) in about 15 minutes. Biking. Hiking. Golfing. Dancing. Water aerobics. Vigorous-intensity exercise ideas include: Walking 4.5 miles (7.2 km) or more in about 1 hour. Jogging or running 5 miles (8 km) in about 1 hour. Biking 10 miles (16.1 km) or more in about 1 hour. Lap swimming. Roller-skating or in-line skating. Cross-country skiing. Vigorous competitive sports, such as football, basketball, and soccer. Jumping rope. Aerobic dancing. What are some everyday activities that can help me get exercise? Yard work, such as: Child psychotherapist. Raking and bagging leaves. Washing your car. Pushing a stroller. Shoveling snow. Gardening. Washing windows or floors. How can I be more active in my day-to-day activities? Use stairs instead of an elevator. Take a walk during your lunch break. If you drive, park your car farther away from your work or school. If you take public transportation, get off one stop early and walk the rest of the way. Stand up or walk around during all of your indoor phone calls. Get up, stretch, and walk around every 30 minutes throughout the day. Enjoy exercise with a friend. Support to continue exercising will help you keep a regular routine of activity. Where to find more information You can find more information about exercising to stay healthy from: U.S. Department of Health and Human Services: ThisPath.fi Centers for Disease Control and Prevention (  CDC): FootballExhibition.com.br Summary Exercising regularly is  important. It will improve your overall fitness, flexibility, and endurance. Regular exercise will also improve your overall health. It can help you control your weight, reduce stress, and improve your bone density. Do not exercise so much that you hurt yourself, feel dizzy, or get very short of breath. Before you start a new exercise program, talk with your health care provider. This information is not intended to replace advice given to you by your health care provider. Make sure you discuss any questions you have with your health care provider. Document Revised: 10/19/2020 Document Reviewed: 10/19/2020 Elsevier Patient Education  2024 [employer].

## 2024-01-16 LAB — LIPID PANEL
Chol/HDL Ratio: 2.8 ratio (ref 0.0–4.4)
Cholesterol, Total: 168 mg/dL (ref 100–199)
HDL: 60 mg/dL (ref >39)
LDL Chol Calc (NIH): 96 mg/dL (ref 0–99)
Triglycerides: 58 mg/dL (ref 0–149)
VLDL Cholesterol Cal: 12 mg/dL (ref 5–40)

## 2024-01-16 LAB — CMP14+EGFR
ALT: 20 IU/L (ref 0–32)
AST: 16 IU/L (ref 0–40)
Albumin: 4.3 g/dL (ref 3.9–4.9)
Alkaline Phosphatase: 65 IU/L (ref 44–121)
BUN/Creatinine Ratio: 23 (ref 9–23)
BUN: 15 mg/dL (ref 6–20)
Bilirubin Total: <0.2 mg/dL (ref 0.0–1.2)
CO2: 20 mmol/L (ref 20–29)
Calcium: 9 mg/dL (ref 8.7–10.2)
Chloride: 105 mmol/L (ref 96–106)
Creatinine, Ser: 0.66 mg/dL (ref 0.57–1.00)
Globulin, Total: 2.3 g/dL (ref 1.5–4.5)
Glucose: 73 mg/dL (ref 70–99)
Potassium: 3.9 mmol/L (ref 3.5–5.2)
Sodium: 138 mmol/L (ref 134–144)
Total Protein: 6.6 g/dL (ref 6.0–8.5)
eGFR: 115 mL/min/1.73 (ref >59)

## 2024-01-16 LAB — CBC WITH DIFFERENTIAL/PLATELET
Basophils Absolute: 0 x10E3/uL (ref 0.0–0.2)
Basos: 1 %
EOS (ABSOLUTE): 0.1 x10E3/uL (ref 0.0–0.4)
Eos: 2 %
Hematocrit: 41.2 % (ref 34.0–46.6)
Hemoglobin: 13.9 g/dL (ref 11.1–15.9)
Immature Grans (Abs): 0 x10E3/uL (ref 0.0–0.1)
Immature Granulocytes: 0 %
Lymphocytes Absolute: 1.8 x10E3/uL (ref 0.7–3.1)
Lymphs: 24 %
MCH: 34.2 pg — ABNORMAL HIGH (ref 26.6–33.0)
MCHC: 33.7 g/dL (ref 31.5–35.7)
MCV: 102 fL — ABNORMAL HIGH (ref 79–97)
Monocytes Absolute: 0.6 x10E3/uL (ref 0.1–0.9)
Monocytes: 8 %
Neutrophils Absolute: 5 x10E3/uL (ref 1.4–7.0)
Neutrophils: 65 %
Platelets: 338 x10E3/uL (ref 150–450)
RBC: 4.06 x10E6/uL (ref 3.77–5.28)
RDW: 11.8 % (ref 11.7–15.4)
WBC: 7.6 x10E3/uL (ref 3.4–10.8)

## 2024-01-16 LAB — VITAMIN D 25 HYDROXY (VIT D DEFICIENCY, FRACTURES): Vit D, 25-Hydroxy: 56 ng/mL (ref 30.0–100.0)

## 2024-01-16 LAB — THYROID PANEL WITH TSH
Free Thyroxine Index: 2 (ref 1.2–4.9)
T3 Uptake Ratio: 27 % (ref 24–39)
T4, Total: 7.4 ug/dL (ref 4.5–12.0)
TSH: 1.53 u[IU]/mL (ref 0.450–4.500)

## 2024-01-16 LAB — VITAMIN B12: Vitamin B-12: 630 pg/mL (ref 232–1245)

## 2024-01-18 ENCOUNTER — Other Ambulatory Visit: Payer: Self-pay | Admitting: Pharmacy Technician

## 2024-01-18 ENCOUNTER — Other Ambulatory Visit: Payer: Self-pay

## 2024-01-18 ENCOUNTER — Other Ambulatory Visit (HOSPITAL_COMMUNITY): Payer: Self-pay

## 2024-01-18 ENCOUNTER — Ambulatory Visit: Payer: Self-pay | Admitting: Nurse Practitioner

## 2024-01-18 NOTE — Progress Notes (Signed)
 Specialty Pharmacy Refill Coordination Note  Erica Rodriguez is a 38 y.o. female assessed today regarding refills of clinic administered specialty medication(s) OnabotulinumtoxinA    Clinic requested Courier to Provider Office   Delivery date: 01/28/24   Verified address: [facility] [address] [place], [place]  [address]   Medication will be filled on 01/27/24.

## 2024-01-21 LAB — TOXASSURE SELECT 13 (MW), URINE

## 2024-01-25 ENCOUNTER — Other Ambulatory Visit: Payer: Self-pay | Admitting: Adult Health

## 2024-01-25 DIAGNOSIS — M62838 Other muscle spasm: Secondary | ICD-10-CM

## 2024-01-26 ENCOUNTER — Other Ambulatory Visit: Payer: Self-pay

## 2024-01-26 ENCOUNTER — Other Ambulatory Visit (HOSPITAL_COMMUNITY): Payer: Self-pay

## 2024-01-26 MED ORDER — CYCLOBENZAPRINE HCL 5 MG PO TABS
5.0000 mg | ORAL_TABLET | Freq: Three times a day (TID) | ORAL | 1 refills | Status: DC | PRN
  Filled 2024-01-26: qty 90, 30d supply, fill #0
  Filled 2024-02-29: qty 90, 30d supply, fill #1

## 2024-01-27 ENCOUNTER — Other Ambulatory Visit: Payer: Self-pay

## 2024-01-29 ENCOUNTER — Other Ambulatory Visit (HOSPITAL_COMMUNITY): Payer: Self-pay

## 2024-01-29 ENCOUNTER — Other Ambulatory Visit: Payer: Self-pay

## 2024-02-02 ENCOUNTER — Other Ambulatory Visit (HOSPITAL_COMMUNITY): Payer: Self-pay

## 2024-02-03 ENCOUNTER — Other Ambulatory Visit (HOSPITAL_COMMUNITY): Payer: Self-pay

## 2024-02-03 ENCOUNTER — Other Ambulatory Visit: Payer: Self-pay

## 2024-02-03 ENCOUNTER — Ambulatory Visit: Admitting: Adult Health

## 2024-02-03 VITALS — BP 104/81 | HR 72

## 2024-02-03 DIAGNOSIS — G43711 Chronic migraine without aura, intractable, with status migrainosus: Secondary | ICD-10-CM | POA: Diagnosis not present

## 2024-02-03 MED ORDER — HYDROCODONE-ACETAMINOPHEN 5-325 MG PO TABS
1.0000 | ORAL_TABLET | Freq: Three times a day (TID) | ORAL | 0 refills | Status: AC | PRN
  Filled 2024-02-03: qty 45, 15d supply, fill #0

## 2024-02-03 MED ORDER — ONABOTULINUMTOXINA 200 UNITS IJ SOLR
155.0000 [IU] | Freq: Once | INTRAMUSCULAR | Status: AC
  Administered 2024-02-03: 140 [IU] via INTRAMUSCULAR

## 2024-02-03 NOTE — Progress Notes (Signed)
 Botox - 200 units x 1 vial Lot: [id number] Expiration: 04/2026 NDC: 9976-6078-97  Bacteriostatic 0.9% Sodium Chloride-  4mL  Lot: [id number] Expiration: 04/2025 NDC: 9590-8033-97  Dx: G43.709  S/P  Witnessed by Heather PEAK

## 2024-02-03 NOTE — Progress Notes (Signed)
 02/03/24: Overall doing well. Has more headaches the closer she gets to botox .  She states that she has not been sleeping well.  Clenching her jaws more due to stress.  She had to use Toradol  last week due to her migraine.  Has 4-5 migraines a month.  On occasion she may have a migraine that she experiences black spots in her vision.  No sensory changes.  Reports photophobia and phonophobia as well as nausea.  Remains on Flexeril  5 mg 3 times daily as needed, Zofran  for nausea, propranolol  80 mg daily, Topamax  100 mg twice a day she also has Ubrelvy .  I did caution the patient that migraines with aura and bleeding on estrogen can increase her risk for stroke.  She is not currently on estrogen just making her aware.  11/04/23: Last month more migraines. Uses ubrelvy . Used toradol  2 weeks ago but that was the first time in a while that she had to use that. Also uses flexeril  but also has neck pain. Remains on Topamax , propranolol  and Aimovig .   As I was almost done with botox  she mentioned that she got occipital nerve blocks several weeks ago. Reports that it was painful.   08/10/23: Patient states that her headaches have increased in the last 2 weeks.  She states that she can tell the Botox  has started to wear off.  She also notes that she usually gets ptosis of the right eye after injections.  Although it was not as bad as the first time she had injections.  She remains on Topamax , propranolol  and Aimovig  for prevention.  Has Ubrelvy , Toradol  and Flexeril  to use as needed.  This time she would like to avoid injecting around the eyes due to ptosis and asymmetry.  I did advise that if she feels that her migraines are worse this next around we can resume injecting at the next visit.  We did inject the frontalis however I went higher in the hairline.  04/28/2023: Patient feels her jaw pain exacerbates her migraines so placed 5U each masseter as well as 5U each at the line from the orb oculi to the temporalis  muscle area where the pain presents   02/03/23: Stable- gets migraines when she is due for botox . Uses ubrelvy  and ibuprofen  when she does get a migraine. Reports that the last two injections with Dr. Ines she had ptosis in the right eye. I injected higher in the forehead. Also offered to not injected the frontalis or corrugators but she wanted the injections.      BOTOX  PROCEDURE NOTE FOR MIGRAINE HEADACHE    Contraindications and precautions discussed with patient(above). Aseptic procedure was observed and patient tolerated procedure. Procedure performed by Duwaine Russell, NP  The condition has existed for more than 6 months, and pt does not have a diagnosis of ALS, Myasthenia Gravis or Lambert-Eaton Syndrome.  Risks and benefits of injections discussed and pt agrees to proceed with the procedure.  Written consent obtained  These injections are medically necessary. He receives good benefits from these injections. These injections do not cause sedations or hallucinations which the oral therapies may cause.  Indication/Diagnosis: chronic migraine BOTOX (G9414) injection was performed according to protocol by [employer]. 200 units of BOTOX  was dissolved into 4 cc NS.   NDC: [address]-98  Type of toxin: Botox   Botox - 200 units x 1 vial Lot: [id number] Expiration: 04/2026 NDC: 9976-6078-97   Bacteriostatic 0.9% Sodium Chloride- 4 mL  Lot: [id number] Expiration: 04/2025 NDC: 9590-8033-97   Dx: H56.290  Description of procedure:  The patient was placed in a sitting position. The standard protocol was used for Botox  as follows, with 5 units of Botox  injected at each site:   -Procerus muscle, deferred  -Corrugator muscle, deferred  -Frontalis muscle, bilateral injection, with 2 sites each side, medial injection was performed in the upper one third of the frontalis muscle, in the region vertical from the medial inferior edge of the superior orbital rim. The lateral injection was  again in the upper one third of the forehead vertically above the lateral limbus of the cornea, 1.5 cm lateral to the medial injection site.  -Temporalis muscle injection, 4 sites, bilaterally. The first injection was 3 cm above the tragus of the ear, second injection site was 1.5 cm to 3 cm up from the first injection site in line with the tragus of the ear. The third injection site was 1.5-3 cm forward between the first 2 injection sites. The fourth injection site was 1.5 cm posterior to the second injection site.  -Occipitalis muscle injection, 3 sites, bilaterally. The first injection was done one half way between the occipital protuberance and the tip of the mastoid process behind the ear. The second injection site was done lateral and superior to the first, 1 fingerbreadth from the first injection. The third injection site was 1 fingerbreadth superiorly and medially from the first injection site.  -Cervical paraspinal muscle injection, 2 sites, bilateral knee first injection site was 1 cm from the midline of the cervical spine, 3 cm inferior to the lower border of the occipital protuberance. The second injection site was 1.5 cm superiorly and laterally to the first injection site.  -Trapezius muscle injection was performed at 3 sites, bilaterally. The first injection site was in the upper trapezius muscle halfway between the inflection point of the neck, and the acromion. The second injection site was one half way between the acromion and the first injection site. The third injection was done between the first injection site and the inflection point of the neck.   Will return for repeat injection in 3 months.   A 200 unit sof Botox  was used, 140 units were injected, the rest of the Botox  was wasted. The patient tolerated the procedure well, there were no complications of the above procedure.  Duwaine Russell, MSN, NP-C 02/03/2024, 8:06 AM [facility] [address], [address] [place], [place] [address] [phone]

## 2024-02-08 ENCOUNTER — Other Ambulatory Visit: Payer: Self-pay

## 2024-02-08 ENCOUNTER — Other Ambulatory Visit (HOSPITAL_COMMUNITY): Payer: Self-pay

## 2024-02-10 ENCOUNTER — Other Ambulatory Visit: Payer: Self-pay

## 2024-02-12 ENCOUNTER — Ambulatory Visit: Admitting: Podiatry

## 2024-02-12 ENCOUNTER — Other Ambulatory Visit: Payer: Self-pay | Admitting: Nurse Practitioner

## 2024-02-12 DIAGNOSIS — E538 Deficiency of other specified B group vitamins: Secondary | ICD-10-CM

## 2024-02-15 ENCOUNTER — Other Ambulatory Visit: Payer: Self-pay

## 2024-02-15 ENCOUNTER — Other Ambulatory Visit (HOSPITAL_COMMUNITY): Payer: Self-pay

## 2024-02-15 MED ORDER — CYANOCOBALAMIN 1000 MCG/ML IJ SOLN
1000.0000 ug | INTRAMUSCULAR | 1 refills | Status: DC
  Filled 2024-02-15: qty 3, 90d supply, fill #0

## 2024-02-18 ENCOUNTER — Encounter: Payer: Self-pay | Admitting: Neurology

## 2024-02-18 MED ORDER — TOPIRAMATE 100 MG PO TABS: 100.0000 mg | ORAL_TABLET | Freq: Two times a day (BID) | ORAL | 0 refills | Status: AC

## 2024-03-01 ENCOUNTER — Other Ambulatory Visit: Payer: Self-pay

## 2024-03-01 ENCOUNTER — Other Ambulatory Visit (HOSPITAL_COMMUNITY): Payer: Self-pay

## 2024-03-08 ENCOUNTER — Other Ambulatory Visit (HOSPITAL_COMMUNITY): Payer: Self-pay

## 2024-03-09 ENCOUNTER — Other Ambulatory Visit (HOSPITAL_COMMUNITY): Payer: Self-pay

## 2024-03-09 ENCOUNTER — Other Ambulatory Visit: Payer: Self-pay

## 2024-03-09 MED ORDER — HYDROCODONE-ACETAMINOPHEN 5-325 MG PO TABS
1.0000 | ORAL_TABLET | Freq: Three times a day (TID) | ORAL | 0 refills | Status: DC | PRN
  Filled 2024-03-09: qty 45, 15d supply, fill #0

## 2024-03-09 MED ORDER — GABAPENTIN 600 MG PO TABS
600.0000 mg | ORAL_TABLET | Freq: Four times a day (QID) | ORAL | 3 refills | Status: DC
  Filled 2024-03-09: qty 120, 30d supply, fill #0
  Filled 2024-04-10: qty 120, 30d supply, fill #1
  Filled 2024-05-02 – 2024-05-09 (×2): qty 120, 30d supply, fill #2
  Filled 2024-06-14: qty 120, 30d supply, fill #3

## 2024-03-14 ENCOUNTER — Telehealth: Payer: Self-pay | Admitting: Adult Health

## 2024-03-14 NOTE — Telephone Encounter (Signed)
 LVM and sent mychart msg informing pt of appt change - NP schedule change

## 2024-03-23 DIAGNOSIS — M533 Sacrococcygeal disorders, not elsewhere classified: Secondary | ICD-10-CM | POA: Diagnosis not present

## 2024-03-29 ENCOUNTER — Encounter: Payer: Self-pay | Admitting: Neurology

## 2024-03-29 MED ORDER — METHYLPREDNISOLONE 4 MG PO TBPK: ORAL_TABLET | ORAL | 0 refills | Status: DC

## 2024-03-29 NOTE — Telephone Encounter (Signed)
 Spoke to patient . Migraine for last 4-5 days Pt states has taken migraine medication as prescribed  Pt states no issues with kidney function ,not diabetic , and hasn't had any reaction to steroid Pak before. Sent rx to Megan,NP for approval

## 2024-03-30 ENCOUNTER — Other Ambulatory Visit: Payer: Self-pay | Admitting: Neurology

## 2024-03-30 ENCOUNTER — Other Ambulatory Visit (HOSPITAL_COMMUNITY): Payer: Self-pay

## 2024-03-30 DIAGNOSIS — M62838 Other muscle spasm: Secondary | ICD-10-CM

## 2024-03-31 ENCOUNTER — Other Ambulatory Visit (HOSPITAL_COMMUNITY): Payer: Self-pay

## 2024-03-31 ENCOUNTER — Other Ambulatory Visit: Payer: Self-pay

## 2024-03-31 MED ORDER — HYDROCODONE-ACETAMINOPHEN 5-325 MG PO TABS
1.0000 | ORAL_TABLET | Freq: Three times a day (TID) | ORAL | 0 refills | Status: DC | PRN
  Filled 2024-03-31: qty 45, 15d supply, fill #0

## 2024-03-31 MED ORDER — CYCLOBENZAPRINE HCL 5 MG PO TABS
5.0000 mg | ORAL_TABLET | Freq: Three times a day (TID) | ORAL | 0 refills | Status: DC | PRN
  Filled 2024-03-31: qty 90, 30d supply, fill #0

## 2024-04-01 ENCOUNTER — Other Ambulatory Visit (HOSPITAL_COMMUNITY): Payer: Self-pay

## 2024-04-01 ENCOUNTER — Other Ambulatory Visit: Payer: Self-pay

## 2024-04-05 ENCOUNTER — Telehealth: Payer: Self-pay | Admitting: Adult Health

## 2024-04-05 NOTE — Telephone Encounter (Signed)
 Submitted auth request via CMM, status is pending. Key: [id number]

## 2024-04-06 NOTE — Telephone Encounter (Signed)
 Received approval, pt will continue to fill through [facility].  Auth#: [id number] (04/05/24-04/04/25)

## 2024-04-07 ENCOUNTER — Ambulatory Visit: Admitting: Podiatry

## 2024-04-08 ENCOUNTER — Telehealth: Admitting: Physician Assistant

## 2024-04-08 DIAGNOSIS — B3731 Acute candidiasis of vulva and vagina: Secondary | ICD-10-CM

## 2024-04-08 MED ORDER — FLUCONAZOLE 150 MG PO TABS: 150.0000 mg | ORAL_TABLET | ORAL | 0 refills | Status: DC | PRN

## 2024-04-08 NOTE — Progress Notes (Signed)

## 2024-04-08 NOTE — Addendum Note (Signed)
 Addended by: VIVIENNE DELON HERO on: 04/08/2024 03:57 PM   Modules accepted: Orders

## 2024-04-11 ENCOUNTER — Other Ambulatory Visit: Payer: Self-pay

## 2024-04-11 ENCOUNTER — Other Ambulatory Visit (HOSPITAL_COMMUNITY): Payer: Self-pay

## 2024-04-13 ENCOUNTER — Other Ambulatory Visit: Payer: Self-pay

## 2024-04-19 ENCOUNTER — Encounter: Payer: Self-pay | Admitting: Podiatry

## 2024-04-20 ENCOUNTER — Ambulatory Visit: Admitting: Podiatry

## 2024-04-22 ENCOUNTER — Other Ambulatory Visit (HOSPITAL_COMMUNITY): Payer: Self-pay

## 2024-04-25 ENCOUNTER — Other Ambulatory Visit: Payer: Self-pay

## 2024-04-25 NOTE — Progress Notes (Signed)
 Specialty Pharmacy Refill Coordination Note  Erica Rodriguez is a 38 y.o. female assessed today regarding refills of clinic administered specialty medication(s) OnabotulinumtoxinA    Clinic requested Courier to Provider Office   Delivery date: 04/27/24   Verified address: [facility] [address] [place], [place]  [address]   Medication will be filled on 04/26/24.

## 2024-04-27 ENCOUNTER — Other Ambulatory Visit: Payer: Self-pay

## 2024-04-28 DIAGNOSIS — M5412 Radiculopathy, cervical region: Secondary | ICD-10-CM | POA: Diagnosis not present

## 2024-05-02 ENCOUNTER — Ambulatory Visit: Admitting: Adult Health

## 2024-05-02 ENCOUNTER — Other Ambulatory Visit: Payer: Self-pay | Admitting: Adult Health

## 2024-05-02 ENCOUNTER — Other Ambulatory Visit (HOSPITAL_COMMUNITY): Payer: Self-pay

## 2024-05-02 VITALS — BP 121/84 | HR 101

## 2024-05-02 DIAGNOSIS — G43711 Chronic migraine without aura, intractable, with status migrainosus: Secondary | ICD-10-CM

## 2024-05-02 DIAGNOSIS — M62838 Other muscle spasm: Secondary | ICD-10-CM

## 2024-05-02 DIAGNOSIS — G43709 Chronic migraine without aura, not intractable, without status migrainosus: Secondary | ICD-10-CM

## 2024-05-02 MED ORDER — ONABOTULINUMTOXINA 200 UNITS IJ SOLR
155.0000 [IU] | Freq: Once | INTRAMUSCULAR | Status: AC
  Administered 2024-05-02: 155 [IU] via INTRAMUSCULAR

## 2024-05-02 NOTE — Progress Notes (Signed)
 05/02/24:usually gets breakthrough when Botox  is due.  She states for the past month she has had a headache every 4 to 5 days.  She remains on propranolol  Topamax , Flexeril  as needed, Zofran  as needed.  She also takes Ubrelvy  with good benefit.  She remains on Aimovig .  She returns today for an evaluation.  02/03/24: Overall doing well. Has more headaches the closer she gets to botox .  She states that she has not been sleeping well.  Clenching her jaws more due to stress.  She had to use Toradol  last week due to her migraine.  Has 4-5 migraines a month.  On occasion she may have a migraine that she experiences black spots in her vision.  No sensory changes.  Reports photophobia and phonophobia as well as nausea.  Remains on Flexeril  5 mg 3 times daily as needed, Zofran  for nausea, propranolol  80 mg daily, Topamax  100 mg twice a day she also has Ubrelvy .  I did caution the patient that migraines with aura and bleeding on estrogen can increase her risk for stroke.  She is not currently on estrogen just making her aware.  11/04/23: Last month more migraines. Uses ubrelvy . Used toradol  2 weeks ago but that was the first time in a while that she had to use that. Also uses flexeril  but also has neck pain. Remains on Topamax , propranolol  and Aimovig .   As I was almost done with botox  she mentioned that she got occipital nerve blocks several weeks ago. Reports that it was painful.   08/10/23: Patient states that her headaches have increased in the last 2 weeks.  She states that she can tell the Botox  has started to wear off.  She also notes that she usually gets ptosis of the right eye after injections.  Although it was not as bad as the first time she had injections.  She remains on Topamax , propranolol  and Aimovig  for prevention.  Has Ubrelvy , Toradol  and Flexeril  to use as needed.  This time she would like to avoid injecting around the eyes due to ptosis and asymmetry.  I did advise that if she feels that  her migraines are worse this next around we can resume injecting at the next visit.  We did inject the frontalis however I went higher in the hairline.  04/28/2023: Patient feels her jaw pain exacerbates her migraines so placed 5U each masseter as well as 5U each at the line from the orb oculi to the temporalis muscle area where the pain presents   02/03/23: Stable- gets migraines when she is due for botox . Uses ubrelvy  and ibuprofen  when she does get a migraine. Reports that the last two injections with Dr. Ines she had ptosis in the right eye. I injected higher in the forehead. Also offered to not injected the frontalis or corrugators but she wanted the injections.      BOTOX  PROCEDURE NOTE FOR MIGRAINE HEADACHE    Contraindications and precautions discussed with patient(above). Aseptic procedure was observed and patient tolerated procedure. Procedure performed by Duwaine Russell, NP  The condition has existed for more than 6 months, and pt does not have a diagnosis of ALS, Myasthenia Gravis or Lambert-Eaton Syndrome.  Risks and benefits of injections discussed and pt agrees to proceed with the procedure.  Written consent obtained  These injections are medically necessary. He receives good benefits from these injections. These injections do not cause sedations or hallucinations which the oral therapies may cause.  Indication/Diagnosis: chronic migraine BOTOX (G9414) injection  was performed according to protocol by [employer]. 200 units of BOTOX  was dissolved into 4 cc NS.   NDC: [address]-98  Type of toxin: Botox   Botox - 200 units x 1 vial Lot: [id number] Expiration: 06/2026 NDC: 9976-6078-97   Bacteriostatic 0.9% Sodium Chloride- 4 mL  Lot: [id number] Expiration: 04/2025 NDC: 9590-8033-97   Dx: H56.290         Description of procedure:  The patient was placed in a sitting position. The standard protocol was used for Botox  as follows, with 5 units of Botox  injected at each  site:   -Procerus muscle, deferred  -Corrugator muscle, deferred  -Frontalis muscle, bilateral injection, with 2 sites each side, medial injection was performed in the upper one third of the frontalis muscle, in the region vertical from the medial inferior edge of the superior orbital rim. The lateral injection was again in the upper one third of the forehead vertically above the lateral limbus of the cornea, 1.5 cm lateral to the medial injection site.  -Temporalis muscle injection, 4 sites, bilaterally. The first injection was 3 cm above the tragus of the ear, second injection site was 1.5 cm to 3 cm up from the first injection site in line with the tragus of the ear. The third injection site was 1.5-3 cm forward between the first 2 injection sites. The fourth injection site was 1.5 cm posterior to the second injection site.  -Occipitalis muscle injection, 3 sites, bilaterally. The first injection was done one half way between the occipital protuberance and the tip of the mastoid process behind the ear. The second injection site was done lateral and superior to the first, 1 fingerbreadth from the first injection. The third injection site was 1 fingerbreadth superiorly and medially from the first injection site.  -Cervical paraspinal muscle injection, 2 sites, bilateral knee first injection site was 1 cm from the midline of the cervical spine, 3 cm inferior to the lower border of the occipital protuberance. The second injection site was 1.5 cm superiorly and laterally to the first injection site.  -Trapezius muscle injection was performed at 3 sites, bilaterally. The first injection site was in the upper trapezius muscle halfway between the inflection point of the neck, and the acromion. The second injection site was one half way between the acromion and the first injection site. The third injection was done between the first injection site and the inflection point of the neck.   Will return for  repeat injection in 3 months.   A 200 unit sof Botox  was used, 140 units were injected, the rest of the Botox  was wasted. The patient tolerated the procedure well, there were no complications of the above procedure.  Duwaine Russell, MSN, NP-C 05/02/2024, 2:15 PM [facility] [address] [place], [place] [address] [phone]

## 2024-05-02 NOTE — Progress Notes (Signed)
 Botox - 200 units x 1 vial Lot: [id number] Expiration: 06/2026 NDC: 9976-6078-97   Bacteriostatic 0.9% Sodium Chloride- 4 mL  Lot: [id number] Expiration: 04/2025 NDC: 9590-8033-97   Dx: G43.709 S/P  Witnessed by Rojean DEL

## 2024-05-03 ENCOUNTER — Other Ambulatory Visit (HOSPITAL_COMMUNITY): Payer: Self-pay

## 2024-05-03 ENCOUNTER — Other Ambulatory Visit: Payer: Self-pay

## 2024-05-03 MED ORDER — CYCLOBENZAPRINE HCL 5 MG PO TABS
5.0000 mg | ORAL_TABLET | Freq: Three times a day (TID) | ORAL | 0 refills | Status: DC | PRN
  Filled 2024-05-03: qty 90, 30d supply, fill #0

## 2024-05-03 MED ORDER — HYDROCODONE-ACETAMINOPHEN 5-325 MG PO TABS
1.0000 | ORAL_TABLET | Freq: Three times a day (TID) | ORAL | 0 refills | Status: DC | PRN
  Filled 2024-05-03: qty 45, 15d supply, fill #0

## 2024-05-16 ENCOUNTER — Other Ambulatory Visit (HOSPITAL_COMMUNITY): Payer: Self-pay

## 2024-05-16 ENCOUNTER — Other Ambulatory Visit: Payer: Self-pay

## 2024-05-23 ENCOUNTER — Other Ambulatory Visit: Payer: Self-pay

## 2024-05-23 ENCOUNTER — Other Ambulatory Visit (HOSPITAL_COMMUNITY): Payer: Self-pay

## 2024-05-23 DIAGNOSIS — E538 Deficiency of other specified B group vitamins: Secondary | ICD-10-CM

## 2024-05-23 MED ORDER — CYANOCOBALAMIN 1000 MCG/ML IJ SOLN
1000.0000 ug | INTRAMUSCULAR | 0 refills | Status: DC
  Filled 2024-05-23: qty 1, 30d supply, fill #0

## 2024-05-30 ENCOUNTER — Other Ambulatory Visit: Payer: Self-pay

## 2024-05-30 ENCOUNTER — Other Ambulatory Visit (HOSPITAL_COMMUNITY): Payer: Self-pay

## 2024-05-30 MED ORDER — HYDROCODONE-ACETAMINOPHEN 5-325 MG PO TABS
1.0000 | ORAL_TABLET | Freq: Three times a day (TID) | ORAL | 0 refills | Status: DC | PRN
  Filled 2024-05-30: qty 45, 15d supply, fill #0

## 2024-05-31 ENCOUNTER — Other Ambulatory Visit: Payer: Self-pay

## 2024-06-07 ENCOUNTER — Encounter: Payer: Self-pay | Admitting: Adult Health

## 2024-06-07 DIAGNOSIS — M7918 Myalgia, other site: Secondary | ICD-10-CM | POA: Diagnosis not present

## 2024-06-07 DIAGNOSIS — Z79899 Other long term (current) drug therapy: Secondary | ICD-10-CM | POA: Diagnosis not present

## 2024-06-07 DIAGNOSIS — M542 Cervicalgia: Secondary | ICD-10-CM | POA: Diagnosis not present

## 2024-06-13 ENCOUNTER — Telehealth: Admitting: Family Medicine

## 2024-06-13 DIAGNOSIS — R3989 Other symptoms and signs involving the genitourinary system: Secondary | ICD-10-CM | POA: Diagnosis not present

## 2024-06-13 MED ORDER — CEPHALEXIN 500 MG PO CAPS: 500.0000 mg | ORAL_CAPSULE | Freq: Two times a day (BID) | ORAL | 0 refills | Status: AC

## 2024-06-13 NOTE — Progress Notes (Signed)

## 2024-06-14 ENCOUNTER — Other Ambulatory Visit: Payer: Self-pay

## 2024-06-14 ENCOUNTER — Other Ambulatory Visit (HOSPITAL_COMMUNITY): Payer: Self-pay

## 2024-06-15 ENCOUNTER — Encounter (HOSPITAL_COMMUNITY): Payer: Self-pay

## 2024-06-15 ENCOUNTER — Other Ambulatory Visit: Payer: Self-pay

## 2024-06-15 ENCOUNTER — Other Ambulatory Visit: Payer: Self-pay | Admitting: *Deleted

## 2024-06-15 ENCOUNTER — Other Ambulatory Visit: Payer: Self-pay | Admitting: Neurology

## 2024-06-15 ENCOUNTER — Other Ambulatory Visit (HOSPITAL_COMMUNITY): Payer: Self-pay

## 2024-06-15 DIAGNOSIS — R110 Nausea: Secondary | ICD-10-CM

## 2024-06-15 DIAGNOSIS — G43709 Chronic migraine without aura, not intractable, without status migrainosus: Secondary | ICD-10-CM

## 2024-06-15 DIAGNOSIS — G43909 Migraine, unspecified, not intractable, without status migrainosus: Secondary | ICD-10-CM

## 2024-06-15 MED ORDER — UBRELVY 100 MG PO TABS
100.0000 mg | ORAL_TABLET | Freq: Every day | ORAL | 0 refills | Status: DC | PRN
  Filled 2024-06-15: qty 16, 30d supply, fill #0

## 2024-06-15 NOTE — Telephone Encounter (Signed)
 Last seen on 05/02/24 per note  She also takes Ubrelvy  with good benefit.  Follow up scheduled on 07/26/24

## 2024-06-16 MED ORDER — CYANOCOBALAMIN 1000 MCG/ML IJ SOLN: 1000.0000 ug | INTRAMUSCULAR | 0 refills | Status: DC

## 2024-06-20 ENCOUNTER — Other Ambulatory Visit: Payer: Self-pay

## 2024-06-21 ENCOUNTER — Other Ambulatory Visit (HOSPITAL_COMMUNITY): Payer: Self-pay

## 2024-06-21 ENCOUNTER — Encounter: Payer: Self-pay | Admitting: Adult Health

## 2024-06-21 ENCOUNTER — Telehealth: Payer: Self-pay

## 2024-06-21 ENCOUNTER — Telehealth (HOSPITAL_COMMUNITY): Payer: Self-pay

## 2024-06-21 DIAGNOSIS — G43909 Migraine, unspecified, not intractable, without status migrainosus: Secondary | ICD-10-CM

## 2024-06-21 DIAGNOSIS — R110 Nausea: Secondary | ICD-10-CM

## 2024-06-21 MED ORDER — ONDANSETRON 4 MG PO TBDP
4.0000 mg | ORAL_TABLET | Freq: Three times a day (TID) | ORAL | 11 refills | Status: AC | PRN
  Filled 2024-06-24 – 2024-08-09 (×2): qty 30, 5d supply, fill #0

## 2024-06-21 NOTE — Telephone Encounter (Signed)
 PA request has been Submitted. New Encounter has been or will be created for follow up. For additional info see Pharmacy Prior Auth telephone encounter from 06-21-2024.

## 2024-06-21 NOTE — Telephone Encounter (Signed)
 Clinical questions have been answered and PA submitted. PA currently Pending. Please be advised that most companies allow up to 30 days to make a decision. We will advise when a determination has been made, or follow up in 1 week.   Please reach out to our team, Rx Prior Auth Pool, if you haven't heard back in a week.

## 2024-06-21 NOTE — Telephone Encounter (Signed)
 Pharmacy Patient Advocate Encounter   Received notification from [facility] that prior authorization for Aimovig  is required/requested.   Insurance verification completed.   The patient is insured through [facility].   Per test claim: PA required; PA started via CoverMyMeds. KEY [id number] . Waiting for clinical questions to populate.

## 2024-06-22 ENCOUNTER — Other Ambulatory Visit (HOSPITAL_COMMUNITY): Payer: Self-pay

## 2024-06-22 ENCOUNTER — Other Ambulatory Visit: Payer: Self-pay

## 2024-06-22 MED FILL — Prednisone Tab 10 MG: ORAL | 6 days supply | Qty: 21 | Fill #0 | Status: AC

## 2024-06-23 ENCOUNTER — Other Ambulatory Visit: Payer: Self-pay

## 2024-06-23 ENCOUNTER — Other Ambulatory Visit (HOSPITAL_COMMUNITY): Payer: Self-pay

## 2024-06-23 NOTE — Telephone Encounter (Signed)
 PA has been approved-please fill RX-I have sent PT a MyChart, if any issues with copay card I gave her the phone number [phone] to contact them.

## 2024-06-23 NOTE — Telephone Encounter (Signed)
 Pharmacy Patient Advocate Encounter  Received notification from [facility] that Prior Authorization for AImovig  has been APPROVED from 06/22/2024 to 06/21/2025. Ran test claim, Copay is $125.00. This test claim was processed through [facility]- copay amounts may vary at other pharmacies due to pharmacy/plan contracts, or as the patient moves through the different stages of their insurance plan.   PA #/Case ID/Reference #: [id number]

## 2024-06-24 ENCOUNTER — Other Ambulatory Visit (HOSPITAL_COMMUNITY): Payer: Self-pay

## 2024-06-27 ENCOUNTER — Other Ambulatory Visit (HOSPITAL_COMMUNITY): Payer: Self-pay

## 2024-06-28 ENCOUNTER — Other Ambulatory Visit (HOSPITAL_COMMUNITY): Payer: Self-pay

## 2024-06-28 ENCOUNTER — Other Ambulatory Visit: Payer: Self-pay

## 2024-06-29 ENCOUNTER — Other Ambulatory Visit (HOSPITAL_COMMUNITY): Payer: Self-pay

## 2024-06-29 ENCOUNTER — Other Ambulatory Visit: Payer: Self-pay

## 2024-06-29 MED ORDER — HYDROCODONE-ACETAMINOPHEN 5-325 MG PO TABS
1.0000 | ORAL_TABLET | Freq: Three times a day (TID) | ORAL | 0 refills | Status: DC | PRN
  Filled 2024-06-29: qty 45, 15d supply, fill #0

## 2024-07-05 ENCOUNTER — Other Ambulatory Visit: Payer: Self-pay

## 2024-07-05 ENCOUNTER — Other Ambulatory Visit: Payer: Self-pay | Admitting: Adult Health

## 2024-07-05 ENCOUNTER — Other Ambulatory Visit (HOSPITAL_COMMUNITY): Payer: Self-pay

## 2024-07-05 DIAGNOSIS — M62838 Other muscle spasm: Secondary | ICD-10-CM

## 2024-07-05 DIAGNOSIS — M533 Sacrococcygeal disorders, not elsewhere classified: Secondary | ICD-10-CM | POA: Diagnosis not present

## 2024-07-05 MED ORDER — CYCLOBENZAPRINE HCL 5 MG PO TABS
5.0000 mg | ORAL_TABLET | Freq: Three times a day (TID) | ORAL | 0 refills | Status: DC | PRN
  Filled 2024-07-05: qty 90, 30d supply, fill #0

## 2024-07-11 ENCOUNTER — Other Ambulatory Visit (HOSPITAL_COMMUNITY): Payer: Self-pay

## 2024-07-12 ENCOUNTER — Other Ambulatory Visit: Payer: Self-pay | Admitting: Neurology

## 2024-07-12 ENCOUNTER — Other Ambulatory Visit (HOSPITAL_COMMUNITY): Payer: Self-pay

## 2024-07-12 ENCOUNTER — Other Ambulatory Visit: Payer: Self-pay

## 2024-07-12 DIAGNOSIS — G43711 Chronic migraine without aura, intractable, with status migrainosus: Secondary | ICD-10-CM

## 2024-07-12 MED ORDER — GABAPENTIN 600 MG PO TABS
600.0000 mg | ORAL_TABLET | Freq: Four times a day (QID) | ORAL | 3 refills | Status: AC
  Filled 2024-07-12 – 2024-07-13 (×2): qty 120, 30d supply, fill #0
  Filled 2024-08-09: qty 120, 30d supply, fill #1

## 2024-07-13 ENCOUNTER — Other Ambulatory Visit (HOSPITAL_COMMUNITY): Payer: Self-pay

## 2024-07-13 ENCOUNTER — Other Ambulatory Visit: Payer: Self-pay | Admitting: Nurse Practitioner

## 2024-07-13 ENCOUNTER — Other Ambulatory Visit: Payer: Self-pay

## 2024-07-13 DIAGNOSIS — E538 Deficiency of other specified B group vitamins: Secondary | ICD-10-CM

## 2024-07-14 ENCOUNTER — Other Ambulatory Visit: Payer: Self-pay

## 2024-07-14 ENCOUNTER — Other Ambulatory Visit: Payer: Self-pay | Admitting: Adult Health

## 2024-07-14 DIAGNOSIS — G43711 Chronic migraine without aura, intractable, with status migrainosus: Secondary | ICD-10-CM

## 2024-07-15 ENCOUNTER — Other Ambulatory Visit (HOSPITAL_COMMUNITY): Payer: Self-pay

## 2024-07-15 ENCOUNTER — Other Ambulatory Visit: Payer: Self-pay

## 2024-07-15 MED ORDER — BOTOX 200 UNITS IJ SOLR
INTRAMUSCULAR | 3 refills | Status: AC
  Filled 2024-07-15: qty 1, fill #0
  Filled 2024-07-15: qty 1, 84d supply, fill #0

## 2024-07-15 NOTE — Progress Notes (Signed)
 Specialty Pharmacy Refill Coordination Note  Erica Rodriguez is a 39 y.o. female assessed today regarding refills of clinic administered specialty medication(s) OnabotulinumtoxinA    Clinic requested Courier to Provider Office   Delivery date: 07/21/24   Verified address: [facility] [address] [place], [place]  [address]   Medication will be filled on: 07/20/24   Appointment 07/26/24.

## 2024-07-15 NOTE — Telephone Encounter (Signed)
 Last seen on 05/02/24 Follow up scheduled on 07/26/24

## 2024-07-18 ENCOUNTER — Other Ambulatory Visit: Payer: Self-pay

## 2024-07-18 ENCOUNTER — Other Ambulatory Visit (HOSPITAL_COMMUNITY): Payer: Self-pay

## 2024-07-18 MED FILL — Cyanocobalamin Inj 1000 MCG/ML: INTRAMUSCULAR | 90 days supply | Qty: 3 | Fill #0 | Status: AC

## 2024-07-19 ENCOUNTER — Encounter (HOSPITAL_COMMUNITY): Payer: Self-pay

## 2024-07-19 ENCOUNTER — Other Ambulatory Visit (HOSPITAL_COMMUNITY): Payer: Self-pay

## 2024-07-19 ENCOUNTER — Other Ambulatory Visit: Payer: Self-pay

## 2024-07-19 ENCOUNTER — Other Ambulatory Visit: Payer: Self-pay | Admitting: Medical Genetics

## 2024-07-20 ENCOUNTER — Other Ambulatory Visit: Payer: Self-pay

## 2024-07-22 ENCOUNTER — Other Ambulatory Visit: Payer: Self-pay

## 2024-07-22 ENCOUNTER — Ambulatory Visit: Payer: Self-pay | Admitting: Nurse Practitioner

## 2024-07-22 ENCOUNTER — Encounter: Payer: Self-pay | Admitting: Nurse Practitioner

## 2024-07-22 ENCOUNTER — Other Ambulatory Visit (HOSPITAL_COMMUNITY): Payer: Self-pay

## 2024-07-22 ENCOUNTER — Telehealth (HOSPITAL_COMMUNITY): Payer: Self-pay

## 2024-07-22 VITALS — BP 104/66 | HR 91 | Temp 97.9°F | Ht 61.0 in | Wt 111.0 lb

## 2024-07-22 DIAGNOSIS — F411 Generalized anxiety disorder: Secondary | ICD-10-CM | POA: Diagnosis not present

## 2024-07-22 DIAGNOSIS — G43709 Chronic migraine without aura, not intractable, without status migrainosus: Secondary | ICD-10-CM | POA: Diagnosis not present

## 2024-07-22 DIAGNOSIS — F5101 Primary insomnia: Secondary | ICD-10-CM | POA: Diagnosis not present

## 2024-07-22 DIAGNOSIS — S0300XD Dislocation of jaw, unspecified side, subsequent encounter: Secondary | ICD-10-CM

## 2024-07-22 DIAGNOSIS — E538 Deficiency of other specified B group vitamins: Secondary | ICD-10-CM

## 2024-07-22 MED ORDER — BUPROPION HCL ER (XL) 300 MG PO TB24
300.0000 mg | ORAL_TABLET | Freq: Every day | ORAL | 1 refills | Status: AC
  Filled 2024-07-22: qty 90, 90d supply, fill #0

## 2024-07-22 MED ORDER — PROPRANOLOL HCL ER 80 MG PO CP24
80.0000 mg | ORAL_CAPSULE | Freq: Every day | ORAL | 3 refills | Status: AC
  Filled 2024-07-22: qty 90, 90d supply, fill #0

## 2024-07-22 MED ORDER — CYANOCOBALAMIN 1000 MCG/ML IJ SOLN
INTRAMUSCULAR | 0 refills | Status: AC
  Filled 2024-07-22: qty 3, fill #0

## 2024-07-22 MED ORDER — TOPIRAMATE 100 MG PO TABS
100.0000 mg | ORAL_TABLET | Freq: Two times a day (BID) | ORAL | 3 refills | Status: AC
  Filled 2024-07-22: qty 180, 90d supply, fill #0

## 2024-07-22 MED ORDER — UBRELVY 100 MG PO TABS
100.0000 mg | ORAL_TABLET | Freq: Every day | ORAL | 0 refills | Status: DC | PRN
  Filled 2024-07-22: qty 16, 16d supply, fill #0

## 2024-07-22 MED ORDER — CITALOPRAM HYDROBROMIDE 20 MG PO TABS
20.0000 mg | ORAL_TABLET | Freq: Every day | ORAL | 5 refills | Status: AC
  Filled 2024-07-22: qty 90, 90d supply, fill #0

## 2024-07-22 MED ORDER — ZOLPIDEM TARTRATE 10 MG PO TABS
10.0000 mg | ORAL_TABLET | Freq: Every evening | ORAL | 5 refills | Status: AC | PRN
  Filled 2024-07-22 – 2024-07-27 (×2): qty 30, 30d supply, fill #0

## 2024-07-22 MED ORDER — DIAZEPAM 10 MG PO TABS
10.0000 mg | ORAL_TABLET | Freq: Two times a day (BID) | ORAL | 1 refills | Status: AC | PRN
  Filled 2024-07-22: qty 30, 15d supply, fill #0

## 2024-07-22 NOTE — Progress Notes (Signed)
 "  Subjective:    Patient ID: Erica Rodriguez, female    DOB: 08-16-1985, 39 y.o.   MRN: [id number]   Chief Complaint: medical management of chronic issues      HPI:  Erica Rodriguez is a 39 y.o. who identifies as a female who was assigned female at birth.   Social history: Lives with: husband and her mother Work history: [employer]   Comes in today for follow up of the following chronic medical issues:  1. Chronic migraine without aura, with intractable migraine, so stated, with status migrainosus Has occasioanlly but neurology is keeping her under control. She still has occasional episodes.  2. GAD (generalized anxiety disorder) Is on wellbutrin  and is doing ok.    07/22/2024    4:03 PM 01/15/2024    2:56 PM 06/26/2023    4:01 PM 06/02/2023   11:46 AM  GAD 7 : Generalized Anxiety Score  Nervous, Anxious, on Edge 1 0 2 2  Control/stop worrying 2 1 1  0  Worry too much - different things 2 1 0 2  Trouble relaxing 1 1 1 1   Restless 0 0 0 0  Easily annoyed or irritable 0 1 2 1   Afraid - awful might happen 0 0 0 0  Total GAD 7 Score 6 4 6 6   Anxiety Difficulty Not difficult at all Not difficult at all Not difficult at all Not difficult at all       07/22/2024    4:03 PM 01/15/2024    2:56 PM 06/26/2023    4:01 PM  Depression screen PHQ 2/9  Decreased Interest 0 0 2  Down, Depressed, Hopeless 1 0 0  PHQ - 2 Score 1 0 2  Altered sleeping   2  Tired, decreased energy   2  Change in appetite   0  Feeling bad or failure about yourself    0  Trouble concentrating   0  Moving slowly or fidgety/restless   0  Suicidal thoughts   0  PHQ-9 Score   6   Difficult doing work/chores   Not difficult at all     Data saved with a previous flowsheet row definition   3. Primary insomnia Is on ambien . Cannot sleep without meds   New complaints: None today  No Known Allergies Outpatient Encounter Medications as of 07/22/2024  Medication Sig   botulinum toxin Type A  (BOTOX ) 200  units injection Provider to inject 155 units into the muscles of the head and neck every 12 weeks. Discard remainder.   buPROPion  (WELLBUTRIN  XL) 300 MG 24 hr tablet Take 1 tablet (300 mg total) by mouth daily.   celecoxib  (CELEBREX ) 200 MG capsule Take 1 capsule (200 mg total) by mouth once to twice a day with food for joint pain.   citalopram  (CELEXA ) 20 MG tablet Take 1 tablet (20 mg total) by mouth daily.   cyanocobalamin  (VITAMIN B12) 1000 MCG/ML injection INJECT 1 ML (1,000 MCG) INTRAMUSCULARLY EVERY 30 DAYS   cyclobenzaprine  (FLEXERIL ) 5 MG tablet Take 1 tablet (5 mg total) by mouth 3 (three) times daily as needed for muscle spasms   diazepam  (VALIUM ) 10 MG tablet Take 1 tablet (10 mg total) by mouth every 12 (twelve) hours as needed for anxiety.   Erenumab -aooe (AIMOVIG ) 140 MG/ML SOAJ Inject 140 mg into the skin every 30 (thirty) days.   fluconazole  (DIFLUCAN ) 150 MG tablet Take 1 tablet (150 mg total) by mouth every 3 (three) days as needed.  gabapentin  (NEURONTIN ) 600 MG tablet Take 1 tablet (600 mg total) by mouth 4 (four) times daily.   HYDROcodone -acetaminophen  (NORCO/VICODIN) 5-325 MG tablet Take 1 tablet by mouth 3 (three) times daily as needed. No alcohol.   ketorolac  (TORADOL ) 60 MG/2ML SOLN injection Inject 2 mLs (60 mg total) into the muscle, at onset of migraine. May repeat in 6 hours. Use a max of 2 times daily and 4 days a month.   MIRENA , 52 MG, 20 MCG/24HR IUD    OnabotulinumtoxinA  (BOTOX  IM) Inject into the muscle. 155units IM to head and neck every 3 months for migraines.   ondansetron  (ZOFRAN -ODT) 4 MG disintegrating tablet Dissolve 1-2 tablets (4-8 mg total) by mouth every 8 (eight) hours as needed for nausea or vomiting.   propranolol  ER (INDERAL  LA) 80 MG 24 hr capsule Take 1 capsule (80 mg total) by mouth daily.   topiramate  (TOPAMAX ) 100 MG tablet Take 1 tablet (100 mg total) by mouth 2 (two) times daily.   topiramate  (TOPAMAX ) 100 MG tablet Take 1 tablet (100 mg  total) by mouth 2 (two) times daily.   Ubrogepant  (UBRELVY ) 100 MG TABS Take 1 tablet by mouth daily as needed. Take one tablet at onset of headache, may repeat 1 tablet in 2 hours, no more than 2 tablets in 24 hours   zolpidem  (AMBIEN ) 10 MG tablet Take 1 tablet (10 mg total) by mouth at bedtime as needed for sleep.   Facility-Administered Encounter Medications as of 07/22/2024  Medication   cyanocobalamin  ((VITAMIN B-12)) injection 1,000 mcg    Past Surgical History:  Procedure Laterality Date   CARPAL TUNNEL RELEASE Right 2021   carpel tunnel - left     CESAREAN SECTION     FOOT SURGERY Bilateral    neck and back ablation     [facility]   PLANTAR FASCIA RELEASE  2021   steroid injections in neck     By Dr. Bonner COWBOY NERVE REPAIR     WISDOM TOOTH EXTRACTION      Family History  Problem Relation Age of Onset   Thyroid  disease Mother    Hyperlipidemia Mother    Arthritis/Rheumatoid Mother    Diabetes Mother    Stroke Mother    Hypertension Father    Kidney disease Father    Cirrhosis Father    Liver cancer Father    Diabetes Maternal Grandmother    Stroke Maternal Grandmother    Breast cancer Maternal Grandmother    Stroke Maternal Grandfather    Healthy Son       Controlled substance contract: n/a     Review of Systems  Constitutional:  Negative for diaphoresis.  Eyes:  Negative for pain.  Respiratory:  Negative for shortness of breath.   Cardiovascular:  Negative for chest pain, palpitations and leg swelling.  Gastrointestinal:  Negative for abdominal pain.  Endocrine: Negative for polydipsia.  Skin:  Negative for rash.  Neurological:  Negative for dizziness, weakness and headaches.  Hematological:  Does not bruise/bleed easily.  All other systems reviewed and are negative.      Objective:   Physical Exam Vitals and nursing note reviewed.  Constitutional:      General: She is not in acute distress.    Appearance: Normal appearance. She is  well-developed.  Neck:     Vascular: No carotid bruit or JVD.  Cardiovascular:     Rate and Rhythm: Normal rate and regular rhythm.     Heart sounds: Normal heart  sounds.  Pulmonary:     Effort: Pulmonary effort is normal. No respiratory distress.     Breath sounds: Normal breath sounds. No wheezing or rales.  Chest:     Chest wall: No tenderness.  Abdominal:     General: Bowel sounds are normal. There is no distension or abdominal bruit.     Palpations: Abdomen is soft. There is no hepatomegaly, splenomegaly, mass or pulsatile mass.     Tenderness: There is no abdominal tenderness.  Musculoskeletal:        General: Normal range of motion.     Cervical back: Normal range of motion and neck supple.  Lymphadenopathy:     Cervical: No cervical adenopathy.  Skin:    General: Skin is warm and dry.  Neurological:     Mental Status: She is alert and oriented to person, place, and time.     Deep Tendon Reflexes: Reflexes are normal and symmetric.  Psychiatric:        Behavior: Behavior normal.        Thought Content: Thought content normal.        Judgment: Judgment normal.    BP 104/66   Pulse 91   Temp 97.9 F (36.6 C) (Temporal)   Ht 5' 1 (1.549 m)   Wt 111 lb (50.3 kg)   SpO2 100%   BMI 20.97 kg/m        Assessment & Plan:   JALYIAH SHELLEY comes in today with chief complaint of  Meds ordered this encounter  Medications   cyanocobalamin  (VITAMIN B12) 1000 MCG/ML injection    Sig: INJECT 1 ML (1,000 MCG) INTRAMUSCULARLY EVERY 30 DAYS    Dispense:  3 mL    Refill:  0    Supervising Provider:   MARYANNE CHEW A [[id number]]   propranolol  ER (INDERAL  LA) 80 MG 24 hr capsule    Sig: Take 1 capsule (80 mg total) by mouth daily.    Dispense:  90 capsule    Refill:  3    Mail order please    Supervising Provider:   DETTINGER, JOSHUA A [[id number]]   topiramate  (TOPAMAX ) 100 MG tablet    Sig: Take 1 tablet (100 mg total) by mouth 2 (two) times daily.    Dispense:  180  tablet    Refill:  3    Mail order please    Supervising Provider:   DETTINGER, JOSHUA A [[id number]]   Ubrogepant  (UBRELVY ) 100 MG TABS    Sig: Take 1 tablet by mouth daily as needed. Take one tablet at onset of headache, may repeat 1 tablet in 2 hours, no more than 2 tablets in 24 hours    Dispense:  16 tablet    Refill:  0    #16/30    Supervising Provider:   MARYANNE CHEW A [[id number]]   buPROPion  (WELLBUTRIN  XL) 300 MG 24 hr tablet    Sig: Take 1 tablet (300 mg total) by mouth daily.    Dispense:  90 tablet    Refill:  1    Will double up on what she has and will let you know when she needs filled    Supervising Provider:   MARYANNE CHEW A [[id number]]   zolpidem  (AMBIEN ) 10 MG tablet    Sig: Take 1 tablet (10 mg total) by mouth at bedtime as needed for sleep.    Dispense:  30 tablet    Refill:  5    Supervising Provider:   MARYANNE CHEW A [  [id number]]   citalopram  (CELEXA ) 20 MG tablet    Sig: Take 1 tablet (20 mg total) by mouth daily.    Dispense:  30 tablet    Refill:  5    Supervising Provider:   MARYANNE CHEW A [[id number]]     Diagnosis and orders addressed:  1. Chronic migraine without aura, with intractable migraine, so stated, with status migrainosus Keep follow up with neurology  2. GAD (generalized anxiety disorder) Stress management - buPROPion  (WELLBUTRIN  XL) 300 MG 24 hr tablet; Take 1 tablet (300 mg total) by mouth daily.  Dispense: 90 tablet; Refill: 1  3. Primary insomnia Bedtime routine - zolpidem  (AMBIEN ) 10 MG tablet; Take 1 tablet (10 mg total) by mouth at bedtime as needed for sleep.  Dispense: 30 tablet; Refill: 5   Labs pending Health Maintenance reviewed Diet and exercise encouraged  Follow up plan: 6 months   [name] Gladis, FNP  "

## 2024-07-22 NOTE — Telephone Encounter (Signed)
 Pharmacy Patient Advocate Encounter   Received notification from Pt Calls Messages that prior authorization for Ubrelvy  100MG  tablets  is required/requested.   Insurance verification completed.   The patient is insured through [facility] [employer].   Per test claim: PA required; PA submitted to above mentioned insurance via Latent Key/confirmation #/EOC [id number] Status is pending

## 2024-07-25 ENCOUNTER — Other Ambulatory Visit (HOSPITAL_COMMUNITY): Payer: Self-pay

## 2024-07-26 ENCOUNTER — Ambulatory Visit: Admitting: Adult Health

## 2024-07-26 VITALS — BP 114/83 | HR 88

## 2024-07-26 DIAGNOSIS — G43711 Chronic migraine without aura, intractable, with status migrainosus: Secondary | ICD-10-CM | POA: Diagnosis not present

## 2024-07-26 MED ORDER — ONABOTULINUMTOXINA 200 UNITS IJ SOLR
155.0000 [IU] | Freq: Once | INTRAMUSCULAR | Status: AC
  Administered 2024-07-26: 140 [IU] via INTRAMUSCULAR

## 2024-07-26 NOTE — Progress Notes (Signed)
 Botox - 200 units x 1 vial Lot: [id number] Expiration: 09/2026 NDC: 9976-6078-97  Bacteriostatic 0.9% Sodium Chloride- 4  mL  Lot: [id number] Expiration: 04/2025 NDC: 9590-8033-97  Dx: G43.711 S/P  Witnessed by Sherrod LATHER

## 2024-07-26 NOTE — Progress Notes (Signed)
 "  07/26/24: botox  in combination with aimovig , propranolol , topamax  and flexeril  has been working well to control migraines. She continues to take zofran  and ubrelvy  as needed  05/02/24:usually gets breakthrough when Botox  is due.  She states for the past month she has had a headache every 4 to 5 days.  She remains on propranolol  Topamax , Flexeril  as needed, Zofran  as needed.  She also takes Ubrelvy  with good benefit.  She remains on Aimovig .  She returns today for an evaluation.  02/03/24: Overall doing well. Has more headaches the closer she gets to botox .  She states that she has not been sleeping well.  Clenching her jaws more due to stress.  She had to use Toradol  last week due to her migraine.  Has 4-5 migraines a month.  On occasion she may have a migraine that she experiences black spots in her vision.  No sensory changes.  Reports photophobia and phonophobia as well as nausea.  Remains on Flexeril  5 mg 3 times daily as needed, Zofran  for nausea, propranolol  80 mg daily, Topamax  100 mg twice a day she also has Ubrelvy .  I did caution the patient that migraines with aura and bleeding on estrogen can increase her risk for stroke.  She is not currently on estrogen just making her aware.  11/04/23: Last month more migraines. Uses ubrelvy . Used toradol  2 weeks ago but that was the first time in a while that she had to use that. Also uses flexeril  but also has neck pain. Remains on Topamax , propranolol  and Aimovig .   As I was almost done with botox  she mentioned that she got occipital nerve blocks several weeks ago. Reports that it was painful.   08/10/23: Patient states that her headaches have increased in the last 2 weeks.  She states that she can tell the Botox  has started to wear off.  She also notes that she usually gets ptosis of the right eye after injections.  Although it was not as bad as the first time she had injections.  She remains on Topamax , propranolol  and Aimovig  for prevention.  Has  Ubrelvy , Toradol  and Flexeril  to use as needed.  This time she would like to avoid injecting around the eyes due to ptosis and asymmetry.  I did advise that if she feels that her migraines are worse this next around we can resume injecting at the next visit.  We did inject the frontalis however I went higher in the hairline.  04/28/2023: Patient feels her jaw pain exacerbates her migraines so placed 5U each masseter as well as 5U each at the line from the orb oculi to the temporalis muscle area where the pain presents   02/03/23: Stable- gets migraines when she is due for botox . Uses ubrelvy  and ibuprofen  when she does get a migraine. Reports that the last two injections with Dr. Ines she had ptosis in the right eye. I injected higher in the forehead. Also offered to not injected the frontalis or corrugators but she wanted the injections.      BOTOX  PROCEDURE NOTE FOR MIGRAINE HEADACHE    Contraindications and precautions discussed with patient(above). Aseptic procedure was observed and patient tolerated procedure. Procedure performed by Duwaine Russell, NP  The condition has existed for more than 6 months, and pt does not have a diagnosis of ALS, Myasthenia Gravis or Lambert-Eaton Syndrome.  Risks and benefits of injections discussed and pt agrees to proceed with the procedure.  Written consent obtained  These injections are medically necessary.  He receives good benefits from these injections. These injections do not cause sedations or hallucinations which the oral therapies may cause.  Indication/Diagnosis: chronic migraine BOTOX (G9414) injection was performed according to protocol by [facility]. 200 units of BOTOX  was dissolved into 4 cc NS.   NDC: [address]-98  Type of toxin: Botox        Botox - 200 units x 1 vial Lot: [id number] Expiration: 09/2026 NDC: 9976-6078-97   Bacteriostatic 0.9% Sodium Chloride- 4  mL  Lot: [id number] Expiration: 04/2025 NDC: 9590-8033-97   Dx: H56.288                Description of procedure:  The patient was placed in a sitting position. The standard protocol was used for Botox  as follows, with 5 units of Botox  injected at each site:   -Procerus muscle, deferred  -Corrugator muscle, deferred  -Frontalis muscle, bilateral injection, with 2 sites each side, medial injection was performed in the upper one third of the frontalis muscle, in the region vertical from the medial inferior edge of the superior orbital rim. The lateral injection was again in the upper one third of the forehead vertically above the lateral limbus of the cornea, 1.5 cm lateral to the medial injection site.  -Temporalis muscle injection, 4 sites, bilaterally. The first injection was 3 cm above the tragus of the ear, second injection site was 1.5 cm to 3 cm up from the first injection site in line with the tragus of the ear. The third injection site was 1.5-3 cm forward between the first 2 injection sites. The fourth injection site was 1.5 cm posterior to the second injection site.  -Occipitalis muscle injection, 3 sites, bilaterally. The first injection was done one half way between the occipital protuberance and the tip of the mastoid process behind the ear. The second injection site was done lateral and superior to the first, 1 fingerbreadth from the first injection. The third injection site was 1 fingerbreadth superiorly and medially from the first injection site.  -Cervical paraspinal muscle injection, 2 sites, bilateral knee first injection site was 1 cm from the midline of the cervical spine, 3 cm inferior to the lower border of the occipital protuberance. The second injection site was 1.5 cm superiorly and laterally to the first injection site.  -Trapezius muscle injection was performed at 3 sites, bilaterally. The first injection site was in the upper trapezius muscle halfway between the inflection point of the neck, and the acromion. The second injection site was  one half way between the acromion and the first injection site. The third injection was done between the first injection site and the inflection point of the neck.   Will return for repeat injection in 3 months.   A 200 unit sof Botox  was used, 140 units were injected, the rest of the Botox  was wasted. The patient tolerated the procedure well, there were no complications of the above procedure.  Duwaine Russell, MSN, NP-C 07/26/2024, 9:11 AM [facility] [address], [address] [place], [place] [address] [phone]  "

## 2024-07-27 ENCOUNTER — Other Ambulatory Visit (HOSPITAL_COMMUNITY): Payer: Self-pay

## 2024-07-27 ENCOUNTER — Other Ambulatory Visit: Payer: Self-pay

## 2024-07-27 ENCOUNTER — Encounter: Payer: Self-pay | Admitting: Adult Health

## 2024-07-27 DIAGNOSIS — G43709 Chronic migraine without aura, not intractable, without status migrainosus: Secondary | ICD-10-CM

## 2024-07-29 ENCOUNTER — Other Ambulatory Visit (HOSPITAL_COMMUNITY): Payer: Self-pay

## 2024-08-01 NOTE — Telephone Encounter (Signed)
 Pharmacy Patient Advocate Encounter  Received notification from [employer] that Prior Authorization for  Ubrelvy  100MG  tablets  has been DENIED.  See denial reason below. No denial letter attached in CMM. Will attach denial letter to Media tab once received.   PA #/Case ID/Reference #: [id number]

## 2024-08-02 ENCOUNTER — Other Ambulatory Visit (HOSPITAL_COMMUNITY): Payer: Self-pay

## 2024-08-03 ENCOUNTER — Other Ambulatory Visit: Payer: Self-pay

## 2024-08-05 ENCOUNTER — Encounter (HOSPITAL_COMMUNITY): Payer: Self-pay

## 2024-08-05 ENCOUNTER — Other Ambulatory Visit (HOSPITAL_COMMUNITY): Payer: Self-pay

## 2024-08-06 ENCOUNTER — Other Ambulatory Visit (HOSPITAL_COMMUNITY): Payer: Self-pay

## 2024-08-06 MED ORDER — HYDROCODONE-ACETAMINOPHEN 5-325 MG PO TABS
1.0000 | ORAL_TABLET | Freq: Three times a day (TID) | ORAL | 0 refills | Status: AC | PRN
  Filled 2024-08-06: qty 45, 15d supply, fill #0

## 2024-08-08 ENCOUNTER — Other Ambulatory Visit (HOSPITAL_COMMUNITY): Payer: Self-pay

## 2024-08-09 ENCOUNTER — Other Ambulatory Visit (HOSPITAL_COMMUNITY): Payer: Self-pay

## 2024-08-09 ENCOUNTER — Other Ambulatory Visit: Payer: Self-pay

## 2024-08-09 ENCOUNTER — Other Ambulatory Visit: Payer: Self-pay | Admitting: Adult Health

## 2024-08-09 DIAGNOSIS — M62838 Other muscle spasm: Secondary | ICD-10-CM

## 2024-08-10 ENCOUNTER — Other Ambulatory Visit (HOSPITAL_COMMUNITY): Payer: Self-pay

## 2024-08-10 ENCOUNTER — Other Ambulatory Visit: Payer: Self-pay

## 2024-08-10 ENCOUNTER — Telehealth: Payer: Self-pay

## 2024-08-10 MED ORDER — UBRELVY 100 MG PO TABS: 100.0000 mg | ORAL_TABLET | Freq: Every day | ORAL | 3 refills | Status: AC | PRN

## 2024-08-10 MED ORDER — CYCLOBENZAPRINE HCL 5 MG PO TABS
5.0000 mg | ORAL_TABLET | Freq: Three times a day (TID) | ORAL | 0 refills | Status: AC | PRN
  Filled 2024-08-10: qty 90, 30d supply, fill #0

## 2024-08-10 MED ORDER — GABAPENTIN 600 MG PO TABS
600.0000 mg | ORAL_TABLET | Freq: Four times a day (QID) | ORAL | 1 refills | Status: AC
  Filled 2024-08-10: qty 360, 90d supply, fill #0

## 2024-08-10 NOTE — Telephone Encounter (Signed)
 Pharmacy Patient Advocate Encounter   Received notification from Patient Advice Request messages that prior authorization for Ubrelvy  100MG  tablet is required/requested.   Insurance verification completed.   The patient is insured through [facility].   Per test claim: The current quantity 16 tablets per 30 day co-pay is, $0.00.  No PA needed at this time. This test claim was processed through [facility]- copay amounts may vary at other pharmacies due to pharmacy/plan contracts, or as the patient moves through the different stages of their insurance plan.

## 2024-08-10 NOTE — Telephone Encounter (Signed)
 No PA required. Co-pay is $0.00. Insurance pays maximum of 16 tablets per 30 days.

## 2024-08-10 NOTE — Telephone Encounter (Signed)
 Refill request for ubrevly sent to Pharmacy today

## 2024-08-11 ENCOUNTER — Other Ambulatory Visit: Payer: Self-pay

## 2024-10-25 ENCOUNTER — Ambulatory Visit: Admitting: Adult Health

## 2025-01-19 ENCOUNTER — Ambulatory Visit: Admitting: Nurse Practitioner
